# Patient Record
Sex: Male | Born: 1940 | Race: White | Hispanic: No | Smoking: Former smoker
Health system: Southern US, Community
[De-identification: ages and names within clinical notes are randomized; demographics above are authoritative.]

## PROBLEM LIST (undated history)

## (undated) DIAGNOSIS — C61 Malignant neoplasm of prostate: Secondary | ICD-10-CM

## (undated) DIAGNOSIS — N2581 Secondary hyperparathyroidism of renal origin: Secondary | ICD-10-CM

## (undated) DIAGNOSIS — I2510 Atherosclerotic heart disease of native coronary artery without angina pectoris: Secondary | ICD-10-CM

## (undated) DIAGNOSIS — N186 End stage renal disease: Secondary | ICD-10-CM

## (undated) DIAGNOSIS — D649 Anemia, unspecified: Secondary | ICD-10-CM

## (undated) DIAGNOSIS — I10 Essential (primary) hypertension: Secondary | ICD-10-CM

## (undated) DIAGNOSIS — E785 Hyperlipidemia, unspecified: Secondary | ICD-10-CM

## (undated) DIAGNOSIS — Z923 Personal history of irradiation: Secondary | ICD-10-CM

## (undated) DIAGNOSIS — M1990 Unspecified osteoarthritis, unspecified site: Secondary | ICD-10-CM

## (undated) DIAGNOSIS — Q613 Polycystic kidney, unspecified: Secondary | ICD-10-CM

## (undated) DIAGNOSIS — N289 Disorder of kidney and ureter, unspecified: Secondary | ICD-10-CM

## (undated) DIAGNOSIS — I219 Acute myocardial infarction, unspecified: Secondary | ICD-10-CM

## (undated) DIAGNOSIS — B029 Zoster without complications: Secondary | ICD-10-CM

## (undated) HISTORY — PX: JOINT REPLACEMENT: SHX530

## (undated) HISTORY — PX: HERNIA REPAIR: SHX51

## (undated) HISTORY — PX: ROTATOR CUFF REPAIR: SHX139

## (undated) HISTORY — DX: Atherosclerotic heart disease of native coronary artery without angina pectoris: I25.10

## (undated) HISTORY — PX: AORTIC VALVE REPLACEMENT: SHX41

## (undated) HISTORY — DX: Unspecified osteoarthritis, unspecified site: M19.90

## (undated) HISTORY — DX: Hyperlipidemia, unspecified: E78.5

## (undated) HISTORY — DX: Essential (primary) hypertension: I10

## (undated) HISTORY — DX: Zoster without complications: B02.9

## (undated) HISTORY — PX: REPLACEMENT TOTAL KNEE BILATERAL: SUR1225

## (undated) HISTORY — DX: Anemia, unspecified: D64.9

## (undated) HISTORY — PX: CORONARY ARTERY BYPASS GRAFT: SHX141

## (undated) HISTORY — DX: Secondary hyperparathyroidism of renal origin: N25.81

---

## 2004-04-09 ENCOUNTER — Other Ambulatory Visit: Payer: Self-pay

## 2004-04-19 ENCOUNTER — Ambulatory Visit: Payer: Self-pay | Admitting: Unknown Physician Specialty

## 2004-12-10 ENCOUNTER — Ambulatory Visit: Payer: Self-pay | Admitting: General Practice

## 2005-01-18 ENCOUNTER — Other Ambulatory Visit: Payer: Self-pay

## 2005-01-25 ENCOUNTER — Ambulatory Visit: Payer: Self-pay | Admitting: General Practice

## 2005-05-27 ENCOUNTER — Other Ambulatory Visit: Payer: Self-pay

## 2005-06-03 ENCOUNTER — Inpatient Hospital Stay: Payer: Self-pay | Admitting: General Practice

## 2005-07-10 ENCOUNTER — Ambulatory Visit: Payer: Self-pay | Admitting: Nephrology

## 2005-12-17 ENCOUNTER — Other Ambulatory Visit: Payer: Self-pay

## 2005-12-31 ENCOUNTER — Inpatient Hospital Stay: Payer: Self-pay | Admitting: General Practice

## 2006-07-04 ENCOUNTER — Ambulatory Visit: Payer: Self-pay | Admitting: Nephrology

## 2007-01-02 ENCOUNTER — Other Ambulatory Visit: Payer: Self-pay

## 2007-01-02 ENCOUNTER — Ambulatory Visit: Payer: Self-pay | Admitting: Vascular Surgery

## 2007-01-07 ENCOUNTER — Ambulatory Visit: Payer: Self-pay | Admitting: Vascular Surgery

## 2007-10-21 ENCOUNTER — Ambulatory Visit: Payer: Self-pay | Admitting: Family Medicine

## 2007-11-19 ENCOUNTER — Ambulatory Visit: Payer: Self-pay

## 2008-01-12 ENCOUNTER — Ambulatory Visit: Payer: Self-pay | Admitting: Unknown Physician Specialty

## 2009-05-10 ENCOUNTER — Ambulatory Visit: Payer: Self-pay | Admitting: Family Medicine

## 2010-03-09 ENCOUNTER — Ambulatory Visit: Payer: Self-pay | Admitting: Vascular Surgery

## 2010-06-24 DIAGNOSIS — C61 Malignant neoplasm of prostate: Secondary | ICD-10-CM

## 2010-06-24 DIAGNOSIS — I219 Acute myocardial infarction, unspecified: Secondary | ICD-10-CM

## 2010-06-24 HISTORY — DX: Acute myocardial infarction, unspecified: I21.9

## 2010-06-24 HISTORY — DX: Malignant neoplasm of prostate: C61

## 2011-01-01 ENCOUNTER — Inpatient Hospital Stay: Payer: Self-pay | Admitting: Family Medicine

## 2011-02-01 ENCOUNTER — Ambulatory Visit: Payer: Self-pay | Admitting: Family Medicine

## 2011-02-06 ENCOUNTER — Encounter: Payer: Self-pay | Admitting: Cardiology

## 2011-02-23 ENCOUNTER — Encounter: Payer: Self-pay | Admitting: Cardiology

## 2011-03-13 ENCOUNTER — Ambulatory Visit: Payer: Self-pay | Admitting: Vascular Surgery

## 2011-03-25 ENCOUNTER — Encounter: Payer: Self-pay | Admitting: Cardiology

## 2011-04-25 ENCOUNTER — Encounter: Payer: Self-pay | Admitting: Cardiology

## 2011-05-12 ENCOUNTER — Inpatient Hospital Stay: Payer: Self-pay | Admitting: Family Medicine

## 2011-05-27 ENCOUNTER — Emergency Department: Payer: Self-pay | Admitting: Emergency Medicine

## 2011-06-05 ENCOUNTER — Ambulatory Visit: Payer: Self-pay | Admitting: Vascular Surgery

## 2011-06-10 ENCOUNTER — Ambulatory Visit: Payer: Self-pay | Admitting: Vascular Surgery

## 2011-06-11 LAB — PATHOLOGY REPORT

## 2011-08-14 ENCOUNTER — Ambulatory Visit: Payer: Self-pay | Admitting: Vascular Surgery

## 2011-08-14 LAB — BASIC METABOLIC PANEL
BUN: 24 mg/dL — ABNORMAL HIGH (ref 7–18)
Calcium, Total: 8.8 mg/dL (ref 8.5–10.1)
Chloride: 99 mmol/L (ref 98–107)
Creatinine: 6.82 mg/dL — ABNORMAL HIGH (ref 0.60–1.30)
EGFR (African American): 10 — ABNORMAL LOW
EGFR (Non-African Amer.): 9 — ABNORMAL LOW
Osmolality: 285 (ref 275–301)
Potassium: 4.6 mmol/L (ref 3.5–5.1)
Sodium: 141 mmol/L (ref 136–145)

## 2011-08-14 LAB — CBC
HCT: 34.8 % — ABNORMAL LOW (ref 40.0–52.0)
HGB: 11.9 g/dL — ABNORMAL LOW (ref 13.0–18.0)
MCH: 33.9 pg (ref 26.0–34.0)
MCHC: 34.1 g/dL (ref 32.0–36.0)
MCV: 99 fL (ref 80–100)
Platelet: 211 10*3/uL (ref 150–440)

## 2011-08-21 ENCOUNTER — Ambulatory Visit: Payer: Self-pay | Admitting: Vascular Surgery

## 2011-08-21 LAB — POTASSIUM: Potassium: 4.5 mmol/L (ref 3.5–5.1)

## 2011-10-17 ENCOUNTER — Encounter: Payer: Self-pay | Admitting: Cardiology

## 2011-10-23 ENCOUNTER — Encounter: Payer: Self-pay | Admitting: Cardiology

## 2011-11-23 ENCOUNTER — Encounter: Payer: Self-pay | Admitting: Cardiology

## 2011-12-23 ENCOUNTER — Encounter: Payer: Self-pay | Admitting: Cardiology

## 2012-08-29 ENCOUNTER — Ambulatory Visit: Payer: Self-pay | Admitting: Sports Medicine

## 2012-12-03 DIAGNOSIS — Q613 Polycystic kidney, unspecified: Secondary | ICD-10-CM | POA: Insufficient documentation

## 2012-12-04 DIAGNOSIS — D638 Anemia in other chronic diseases classified elsewhere: Secondary | ICD-10-CM | POA: Insufficient documentation

## 2012-12-04 DIAGNOSIS — N2581 Secondary hyperparathyroidism of renal origin: Secondary | ICD-10-CM | POA: Insufficient documentation

## 2013-02-02 ENCOUNTER — Ambulatory Visit: Payer: Self-pay | Admitting: General Practice

## 2013-02-11 DIAGNOSIS — M4640 Discitis, unspecified, site unspecified: Secondary | ICD-10-CM | POA: Insufficient documentation

## 2013-02-25 ENCOUNTER — Other Ambulatory Visit: Payer: Self-pay | Admitting: General Practice

## 2013-02-25 LAB — SYNOVIAL CELL COUNT + DIFF, W/ CRYSTALS
Basophil: 0 %
Crystals, Joint Fluid: NONE SEEN
Eosinophil: 0 %
Lymphocytes: 0 %
Neutrophils: 99 %
Other Cells BF: 0 %
Other Mononuclear Cells: 1 %

## 2013-03-01 LAB — BODY FLUID CULTURE

## 2013-03-22 ENCOUNTER — Ambulatory Visit: Payer: Self-pay | Admitting: General Practice

## 2013-03-22 LAB — CBC WITH DIFFERENTIAL/PLATELET
Basophil #: 0 10*3/uL (ref 0.0–0.1)
Basophil %: 0.4 %
Eosinophil %: 6.5 %
HCT: 31.2 % — ABNORMAL LOW (ref 40.0–52.0)
Lymphocyte #: 0.9 10*3/uL — ABNORMAL LOW (ref 1.0–3.6)
Lymphocyte %: 9 %
MCH: 31.8 pg (ref 26.0–34.0)
MCV: 92 fL (ref 80–100)
Neutrophil %: 76.3 %
Platelet: 249 10*3/uL (ref 150–440)
RDW: 14.4 % (ref 11.5–14.5)
WBC: 9.6 10*3/uL (ref 3.8–10.6)

## 2013-03-22 LAB — BASIC METABOLIC PANEL
Anion Gap: 7 (ref 7–16)
BUN: 38 mg/dL — ABNORMAL HIGH (ref 7–18)
Chloride: 100 mmol/L (ref 98–107)
Co2: 27 mmol/L (ref 21–32)
Creatinine: 9.49 mg/dL — ABNORMAL HIGH (ref 0.60–1.30)
EGFR (African American): 6 — ABNORMAL LOW
EGFR (Non-African Amer.): 5 — ABNORMAL LOW
Osmolality: 276 (ref 275–301)

## 2013-03-29 ENCOUNTER — Ambulatory Visit: Payer: Self-pay | Admitting: General Practice

## 2013-03-29 LAB — SYNOVIAL CELL COUNT + DIFF, W/ CRYSTALS
Eosinophil: 0 %
Nucleated Cell Count: 42049 /mm3
Other Cells BF: 0 %

## 2013-03-31 LAB — PATHOLOGY REPORT

## 2013-04-02 LAB — BODY FLUID CULTURE

## 2013-08-26 ENCOUNTER — Ambulatory Visit: Payer: Self-pay | Admitting: Unknown Physician Specialty

## 2013-11-23 ENCOUNTER — Encounter: Payer: Self-pay | Admitting: Internal Medicine

## 2014-02-16 DIAGNOSIS — Z951 Presence of aortocoronary bypass graft: Secondary | ICD-10-CM | POA: Insufficient documentation

## 2014-05-13 DIAGNOSIS — Q446 Cystic disease of liver: Secondary | ICD-10-CM | POA: Insufficient documentation

## 2014-07-05 ENCOUNTER — Inpatient Hospital Stay: Payer: Self-pay | Admitting: Family Medicine

## 2014-07-05 LAB — CBC
HCT: 35.7 % — ABNORMAL LOW (ref 40.0–52.0)
HGB: 11.1 g/dL — ABNORMAL LOW (ref 13.0–18.0)
MCH: 29.7 pg (ref 26.0–34.0)
MCHC: 31.1 g/dL — AB (ref 32.0–36.0)
MCV: 95 fL (ref 80–100)
PLATELETS: 129 10*3/uL — AB (ref 150–440)
RBC: 3.74 10*6/uL — ABNORMAL LOW (ref 4.40–5.90)
RDW: 20.6 % — AB (ref 11.5–14.5)
WBC: 6.2 10*3/uL (ref 3.8–10.6)

## 2014-07-05 LAB — COMPREHENSIVE METABOLIC PANEL
ALBUMIN: 3 g/dL — AB (ref 3.4–5.0)
ALK PHOS: 112 U/L
Anion Gap: 8 (ref 7–16)
BUN: 25 mg/dL — ABNORMAL HIGH (ref 7–18)
Bilirubin,Total: 0.6 mg/dL (ref 0.2–1.0)
CALCIUM: 8.5 mg/dL (ref 8.5–10.1)
Chloride: 101 mmol/L (ref 98–107)
Co2: 33 mmol/L — ABNORMAL HIGH (ref 21–32)
Creatinine: 5.72 mg/dL — ABNORMAL HIGH (ref 0.60–1.30)
EGFR (African American): 13 — ABNORMAL LOW
EGFR (Non-African Amer.): 10 — ABNORMAL LOW
GLUCOSE: 169 mg/dL — AB (ref 65–99)
Osmolality: 291 (ref 275–301)
Potassium: 4.8 mmol/L (ref 3.5–5.1)
SGOT(AST): 39 U/L — ABNORMAL HIGH (ref 15–37)
SGPT (ALT): 19 U/L
Sodium: 142 mmol/L (ref 136–145)
Total Protein: 6.4 g/dL (ref 6.4–8.2)

## 2014-07-05 LAB — CK
CK, TOTAL: 100 U/L (ref 39–308)
CK, Total: 113 U/L (ref 39–308)

## 2014-07-05 LAB — TROPONIN I
TROPONIN-I: 1.4 ng/mL — AB
TROPONIN-I: 1.6 ng/mL — AB
Troponin-I: <0.02 ng/mL
Troponin-I: 1.6 ng/mL — ABNORMAL HIGH

## 2014-07-05 LAB — PROTIME-INR
INR: 1.1
PROTHROMBIN TIME: 13.7 s (ref 11.5–14.7)

## 2014-07-05 LAB — APTT: Activated PTT: 35.5 secs (ref 23.6–35.9)

## 2014-07-05 LAB — HEPARIN LEVEL (UNFRACTIONATED): Anti-Xa(Unfractionated): <0.1 IU/mL — ABNORMAL LOW (ref 0.30–0.70)

## 2014-07-05 LAB — CK TOTAL AND CKMB (NOT AT ARMC)
CK, Total: 119 U/L (ref 39–308)
CK-MB: 3.6 ng/mL (ref 0.5–3.6)

## 2014-07-05 LAB — CK-MB: CK-MB: 6.7 ng/mL — AB (ref 0.5–3.6)

## 2014-07-06 LAB — BASIC METABOLIC PANEL
ANION GAP: 7 (ref 7–16)
BUN: 40 mg/dL — ABNORMAL HIGH (ref 7–18)
CALCIUM: 8.5 mg/dL (ref 8.5–10.1)
CHLORIDE: 100 mmol/L (ref 98–107)
CO2: 32 mmol/L (ref 21–32)
Creatinine: 7.65 mg/dL — ABNORMAL HIGH (ref 0.60–1.30)
EGFR (African American): 9 — ABNORMAL LOW
EGFR (Non-African Amer.): 7 — ABNORMAL LOW
Glucose: 80 mg/dL (ref 65–99)
OSMOLALITY: 286 (ref 275–301)
Potassium: 5.5 mmol/L — ABNORMAL HIGH (ref 3.5–5.1)
Sodium: 139 mmol/L (ref 136–145)

## 2014-07-06 LAB — CBC WITH DIFFERENTIAL/PLATELET
BASOS PCT: 1.2 %
Basophil #: 0.1 10*3/uL (ref 0.0–0.1)
EOS ABS: 0.2 10*3/uL (ref 0.0–0.7)
Eosinophil %: 3.6 %
HCT: 35.8 % — AB (ref 40.0–52.0)
HGB: 11.3 g/dL — AB (ref 13.0–18.0)
LYMPHS PCT: 15 %
Lymphocyte #: 0.9 10*3/uL — ABNORMAL LOW (ref 1.0–3.6)
MCH: 30.2 pg (ref 26.0–34.0)
MCHC: 31.5 g/dL — ABNORMAL LOW (ref 32.0–36.0)
MCV: 96 fL (ref 80–100)
MONOS PCT: 12.2 %
Monocyte #: 0.8 x10 3/mm (ref 0.2–1.0)
Neutrophil #: 4.2 10*3/uL (ref 1.4–6.5)
Neutrophil %: 68 %
Platelet: 123 10*3/uL — ABNORMAL LOW (ref 150–440)
RBC: 3.74 10*6/uL — ABNORMAL LOW (ref 4.40–5.90)
RDW: 20.4 % — ABNORMAL HIGH (ref 11.5–14.5)
WBC: 6.2 10*3/uL (ref 3.8–10.6)

## 2014-07-19 ENCOUNTER — Ambulatory Visit: Payer: Self-pay | Admitting: Cardiology

## 2014-07-29 ENCOUNTER — Emergency Department: Payer: Self-pay | Admitting: Emergency Medicine

## 2014-08-09 DIAGNOSIS — Z952 Presence of prosthetic heart valve: Secondary | ICD-10-CM | POA: Insufficient documentation

## 2014-09-26 ENCOUNTER — Ambulatory Visit
Admit: 2014-09-26 | Disposition: A | Discharge status: Home / Self Care | Payer: Self-pay | Attending: Surgery | Admitting: Surgery

## 2014-09-26 LAB — BASIC METABOLIC PANEL
Anion Gap: 9 (ref 7–16)
BUN: 15 mg/dL
CHLORIDE: 95 mmol/L — AB
CREATININE: 3.67 mg/dL — AB
Calcium, Total: 8.3 mg/dL — ABNORMAL LOW
Co2: 37 mmol/L — ABNORMAL HIGH
EGFR (Non-African Amer.): 16 — ABNORMAL LOW
GFR CALC AF AMER: 18 — AB
GLUCOSE: 90 mg/dL
Potassium: 4.1 mmol/L
Sodium: 141 mmol/L

## 2014-09-26 LAB — CBC WITH DIFFERENTIAL/PLATELET
Basophil #: 0.1 10*3/uL (ref 0.0–0.1)
Basophil %: 0.8 %
EOS PCT: 3.3 %
Eosinophil #: 0.2 10*3/uL (ref 0.0–0.7)
HCT: 32.7 % — AB (ref 40.0–52.0)
HGB: 10.1 g/dL — ABNORMAL LOW (ref 13.0–18.0)
Lymphocyte #: 0.7 10*3/uL — ABNORMAL LOW (ref 1.0–3.6)
Lymphocyte %: 9.7 %
MCH: 26.4 pg (ref 26.0–34.0)
MCHC: 31 g/dL — ABNORMAL LOW (ref 32.0–36.0)
MCV: 85 fL (ref 80–100)
MONOS PCT: 10.9 %
Monocyte #: 0.7 x10 3/mm (ref 0.2–1.0)
NEUTROS ABS: 5.1 10*3/uL (ref 1.4–6.5)
Neutrophil %: 75.3 %
Platelet: 147 10*3/uL — ABNORMAL LOW (ref 150–440)
RBC: 3.84 10*6/uL — AB (ref 4.40–5.90)
RDW: 18.8 % — ABNORMAL HIGH (ref 11.5–14.5)
WBC: 6.8 10*3/uL (ref 3.8–10.6)

## 2014-10-04 ENCOUNTER — Ambulatory Visit
Admit: 2014-10-04 | Disposition: A | Discharge status: Home / Self Care | Payer: Self-pay | Attending: Surgery | Admitting: Surgery

## 2014-10-05 LAB — CBC WITH DIFFERENTIAL/PLATELET
BASOS PCT: 0.3 %
Basophil #: 0 10*3/uL (ref 0.0–0.1)
Eosinophil #: 0.3 10*3/uL (ref 0.0–0.7)
Eosinophil %: 3.1 %
HCT: 29.4 % — AB (ref 40.0–52.0)
HGB: 9 g/dL — ABNORMAL LOW (ref 13.0–18.0)
LYMPHS ABS: 0.6 10*3/uL — AB (ref 1.0–3.6)
Lymphocyte %: 6.6 %
MCH: 26.1 pg (ref 26.0–34.0)
MCHC: 30.6 g/dL — ABNORMAL LOW (ref 32.0–36.0)
MCV: 85 fL (ref 80–100)
MONOS PCT: 9 %
Monocyte #: 0.8 x10 3/mm (ref 0.2–1.0)
NEUTROS PCT: 81 %
Neutrophil #: 7.1 10*3/uL — ABNORMAL HIGH (ref 1.4–6.5)
Platelet: 157 10*3/uL (ref 150–440)
RBC: 3.44 10*6/uL — ABNORMAL LOW (ref 4.40–5.90)
RDW: 19.4 % — ABNORMAL HIGH (ref 11.5–14.5)
WBC: 8.8 10*3/uL (ref 3.8–10.6)

## 2014-10-05 LAB — RENAL FUNCTION PANEL
ANION GAP: 12 (ref 7–16)
Albumin: 3 g/dL — ABNORMAL LOW
BUN: 37 mg/dL — ABNORMAL HIGH
CALCIUM: 8.3 mg/dL — AB
CREATININE: 7.08 mg/dL — AB
Chloride: 91 mmol/L — ABNORMAL LOW
Co2: 29 mmol/L
EGFR (Non-African Amer.): 7 — ABNORMAL LOW
GFR CALC AF AMER: 8 — AB
Glucose: 90 mg/dL
PHOSPHORUS: 6.4 mg/dL — AB
POTASSIUM: 5.2 mmol/L — AB
SODIUM: 132 mmol/L — AB

## 2014-10-14 NOTE — Op Note (Signed)
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  PROCEDURE PERFORMED: Right knee arthroscopy and extensive three compartment synovectomy.   SURGEON: Laurice Record. Hooten, MD.   ANESTHESIA: General.   ESTIMATED BLOOD LOSS: Minimal.   TOURNIQUET TIME: Not used.   DRAINS: None.   SPECIMENS: Fluid was submitted for complete cell count, evaluation for crystals, stat Gram stain, and culture and sensitivity. Synovial specimens were submitted for stat Gram stain, culture and sensitivity and routine pathology.   INDICATIONS FOR SURGERY: The patient is a 74 year old gentleman who has remote history of right total knee arthroplasty. He had done well until the last several months. He has end-stage renal disease and undergoes hemodialysis 3 times a week. He apparently developed abscess of the spine and was treated at [facility] with IV antibiotics. At roughly the same time, he had the onset of swelling, and some pain to the right knee. Aspiration both at [facility] and subsequently in our office showed no growth and no apparent organisms. However, the patient has had recurrent effusions. After discussion of the risks and benefits of surgical intervention, the patient expressed understanding of the risks and benefits and agreed with plans for right knee arthroscopy, synovial biopsy, and synovectomy.   PROCEDURE IN DETAIL: The patient was brought to the operating room and, after adequate general anesthesia was achieved, a tourniquet was placed on the patient's right thigh and leg was placed in a leg holder. All bony prominences were well padded. The patient's right knee and leg were cleaned and  prepped with alcohol and DuraPrep and draped in the usual sterile fashion. A "timeout" was performed as per usual protocol. It was at that point that it was discovered that the circulating nurse had administered the antibiotics prematurely. The lab was subsequently notified that all specimens should be handled with the understanding that a single dose of IV antibiotics had been given prior to the procedure. The anticipated portal sites were injected with 0.25% Marcaine with epinephrine. An anterolateral portal was created and a cannula was inserted. A large effusion was evacuated and fluid was submitted for stat Gram stain, cell count, evaluation for crystals, culture and sensitivity.  A stat Gram stain showed no organisms but many WBCs. No crystals were seen in the specimen. The scope was inserted and the knee was distended with fluid using the Stryker pump. Under visualization with the scope, an anteromedial portal was created and probe was inserted. The medial gutter had an extensive amount of synovial tissue with extension anteriorly so that it was initially difficult to visualize the implants. A shaver was inserted and the thickened, fibrotic synovial tissue was initially debrided using a 4.5 mm shaver. Synovial tissue was removed using pituitary rongeurs and submitted for Gram stain, culture and sensitivity. Additional debridement along the medial gutter and anteromedial aspect of the knee was performed using a 50 degrees ArthroCare wand. The implants appeared to be in good condition. The gutter was felt to be well debrided of synovial tissue and in good hemostasis was appreciated. The scope was then removed from the anterolateral portal and reinserted via the anteromedial portal. Some synovial tissue was removed from the notch and also submitted for culture and sensitivity. An extremely thickened, fibrotic  synovial tissue was encountered along the anterolateral aspect and extending along the lateral gutter.  Meticulous debridement was performed using a combination of the 50 degrees ArthroCare wand and the 4.5 mm shaver. The lateral gutter was felt to be cleared of the exuberant synovial tissue. Good hemostasis was noted. Finally, the scope was positioned so as to visualize the patellofemoral articulation. Again, abundant somewhat hypertrophic synovial tissue was noted about the patella. The tissue was debrided using the 50 degrees ArthroCare wand and a 4.5 mm shaver. Mild wear was noted to the central component of the patella. The synovial tissue in both the medial and lateral aspects of the patella was adequately debrided free. Good patellar tracking was noted. Finally, a superolateral portal was created and bands of hypertrophic synovium more debrided using a combination of 4.5 mm shaver and the 50 degrees ArthroCare wand. This extended along the superior portion of the medial and lateral gutters up into the suprapatellar pouch. Good patellar tracking was noted.   The knee was irrigated with copious amounts of fluid and then suctioned dry. The superolateral and anterolateral portals was reapproximated using 3-0 nylon. A combination of 0.25% Marcaine with epinephrine and 4 mg of morphine was injected via the scope. The scope was removed and the anteromedial portal was reapproximated using 3-0 nylon. Sterile dressing was applied followed by application of an ice wrap.   The patient tolerated the procedure well. He was transported to the recovery room in stable condition.     ____________________________ Laurice Record. Holley Bouche., MD [email or web address] D: 03/30/2013 06:27:58 ET T: 03/30/2013 07:00:47 ET JOB#: [id number]  cc: Laurice Record. Holley Bouche., MD, <Dictator> JAMES P Holley Bouche MD ELECTRONICALLY SIGNED 04/03/2013 15:11

## 2014-10-16 NOTE — Op Note (Signed)
PATIENT NAME:  Jimmy Carson, Jimmy Carson MR#:  [id number] DATE OF BIRTH:  [birth year]  DATE OF PROCEDURE:  08/21/2011  PREOPERATIVE DIAGNOSES:  1. End-stage renal disease.  2. Prostate cancer.  3. Hypertension.   POSTOPERATIVE DIAGNOSES:  1. End-stage renal disease.  2. Prostate cancer.  3. Hypertension.   PROCEDURE: Left brachiocephalic AV fistula creation.   SURGEON: Algernon Huxley, MD   ANESTHESIA: General.   ESTIMATED BLOOD LOSS: Approximately 25 mL.   INDICATION FOR PROCEDURE: This is a 74 year old white male with end-stage renal disease. He had a stent placed in a previous left radiocephalic AV fistula that developed infection and had to be excised and this fistula sacrificed. His wound is well healed. He has no clear signs of infection currently. He has an adequate cephalic vein in the upper arm. He is brought in for brachiocephalic AV fistula creation. Risks and benefits were discussed. Informed consent was obtained.   DESCRIPTION OF PROCEDURE: The patient was brought to the operative suite and after an adequate level of general endotracheal anesthesia was obtained, his left upper extremity was sterilely prepped and draped and a sterile surgical field was created. A curvilinear incision was created at the antecubital fossa. I dissected out the cephalic vein, marked this for orientation, and the median antecubital vein which was a very large equal branch to the cephalic vein itself and lay very near the brachial artery. The brachial artery was dissected out and encircled with vessel loops proximally and distally. The patient was given 3000 units of intravenous heparin systemic anticoagulation. I then ligated the vein distally near its junction to the basilic vein, cut and beveled the vein to match an anterior wall arteriotomy and then created anastomosis with a running 6-0 Prolene suture in the usual fashion. A single 6-0 Prolene patch suture was used for hemostasis. The vessel was flushed and  de-aired prior to release of control. On release there was excellent thrill within the AV fistula. There were several venous branches ligated and divided between silk ties as part of our mobilization. The wound was irrigated. Surgicel and Evicyl topical hemostatic agents were placed. Hemostasis was complete. The wound was closed with a running 3-0 Vicryl and a 4-0 Monocryl and Dermabond was placed as a dressing. The patient tolerated the procedure well and was taken to the recovery room in stable condition.   ____________________________ Algernon Huxley, MD [email or web address]:[id number] D: 08/21/2011 14:06:06 ET T: 08/21/2011 14:44:03 ET JOB#: [id number]  cc: Algernon Huxley, MD, <Dictator> Algernon Huxley MD ELECTRONICALLY SIGNED 08/22/2011 12:08

## 2014-10-23 NOTE — Op Note (Signed)
PATIENT NAME:  Jimmy Carson, Jimmy Carson MR#:  [id number] DATE OF BIRTH:  1940/11/27  DATE OF PROCEDURE:  10/04/2014  PREOPERATIVE DIAGNOSIS: Bilateral inguinal hernias, umbilical hernia.   POSTOPERATIVE DIAGNOSIS: Bilateral inguinal hernias, umbilical hernia.   PROCEDURE: Bilateral inguinal hernia repairs and umbilical hernia repair.   SURGEON: Rochel Brome, MD  ANESTHESIA: General.   INDICATIONS: This 74 year old male had recent bulging in the left groin with moderate discomfort. Also has noted some bulging in the right groin and at the umbilicus. He had had recent aortic valve repair which was done through a right groin approach and had developed a seroma which has decreased in size. That transverse incision was made just lateral and inferior to the site of the intended hernia repair incision. Surgery was recommended for definitive treatment.   DESCRIPTION OF PROCEDURE: The patient was placed on the operating table in the supine position under general endotracheal anesthesia. The abdomen was clipped and prepared with ChloraPrep and draped in a sterile manner. A mark was made with a surgical marker to denote the midline and the intended sites for incision.   First on the left side, a transversely oriented suprapubic incision was made some 8 cm in length, carried down through subcutaneous tissues. Two traversing veins were divided between 4-0 chromic suture ligatures. Scarpa fascia was incised and dissection was carried down to the external oblique aponeurosis, which was incised along the course of its fibers to open the external ring and expose the inguinal cord structures. The cord structures were mobilized. There appeared to be some significant involvement of the ilioinguinal nerve and it appeared to be best to divide it to avoid postoperative pain. Subsequently, the cord structures were mobilized. A Penrose drain was passed around the cord structures for traction. The cremaster fibers were spread to  expose an indirect hernia sac, which was dissected free from surrounding structures. The sac was some 9 cm in length. It was opened. Its continuity with the peritoneal cavity was demonstrated. A small amount of bowel could be seen pushing up through the internal ring into the sac. A high ligation of the sac was done with a 4-0 Vicryl ligature followed by suture ligature and the sac was amputated and was not sent for pathology. There was a cord lipoma which was dissected free from surrounding structures and was ligated with 4-0 Vicryl suture ligature and amputated and was not sent for pathology. The floor of the inguinal canal was examined and found to have some moderate weakness. A repair was carried out with a row of 0 Surgilon sutures beginning at the pubic tubercle, suturing the conjoined tendon to the shelving edge of the inguinal ligament incorporating transversalis fascia into the repair. The last stitch led to satisfactory narrowing of the internal ring. An onlay Bard soft mesh was cut to create an oval shape of some 4 x 6 cm, and a notch cut out to straddle the internal ring, and it was placed over the repair and sutured to the repair with 0 Surgilon, also was sutured on both sides of the internal ring. It is further noted that a relaxing incision was made medially and the mesh covered the relaxing incision and was sutured to the medial side of the fascia. The repair looked good and hemostasis was intact. Cord structures were replaced along the floor of the inguinal canal. Cut edges of the external oblique aponeurosis were closed with running 4-0 Vicryl. The deep fascia superior and lateral to the repair site was infiltrated with 0.5%  Sensorcaine with epinephrine. Subcutaneous tissues were infiltrated as well. Scarpa fascia was closed with interrupted 4-0 Vicryl. The skin was closed with running 4-0 Monocryl subcuticular suture.   Next, attention was turned to the right lower quadrant where a mirror image  incision was made some 8 cm in length, carried down through subcutaneous tissues. Two traversing veins were divided between 4-0 chromic suture ligatures and Scarpa fascia was incised. Dissection was carried down to the external oblique aponeurosis. There was a finding of scarring which appeared to be related to his previous surgery which distorted some of the anatomy and did slow down the dissection. The cord structures were mobilized. There was minimal degree of weakness in the floor of the inguinal canal. The cremaster fibers were spread to expose a cord lipoma which was dissected up to the internal ring and was suture ligated with 4-0 Vicryl and amputated and was not sent for pathology. There was also an indirect hernia sac which was dissected free from surrounding structures some 6 cm in length and it was opened. Its continuity with the peritoneal cavity was demonstrated. A high ligation of the sac was done with a 4-0 Vicryl ligature and suture ligature. The sac was excised and was not sent for pathology. It is noted that during the course of the procedure a number of small bleeding points were cauterized, and the repair was carried out with a row of 0 Surgilon sutures suturing the conjoined tendon to the shelving edge of the inguinal ligament and also did make a relaxing incision medially. The last stitch led to satisfactory narrowing of the internal ring. A similar but slightly smaller Bard soft mesh was cut out with a notch to straddle the internal ring and was sutured to the repair, also sutured to the medial aspect of the relaxing incision and on both sides of the internal ring. The repair looked good and hemostasis appeared to be intact. The cut edges of the external oblique aponeurosis were closed with running 4-0 Vicryl and again appreciated scarring and distortion of tissues in this area. The deep fascia superior and lateral to the repair site was infiltrated with 0.5% Sensorcaine with epinephrine and  subcutaneous tissues were infiltrated. It is noted that there were numerous small bleeding points which were cauterized on multiple occasions in the subcutaneous tissues and subcuticular tissues. The Scarpa fascia was closed with interrupted 4-0 Vicryl, and the skin was closed with running 4-0 Monocryl subcuticular suture.   Attention was turned to the umbilical hernia where a transversely oriented 3 cm supraumbilical incision was made and carried down through subcutaneous tissues to encounter an umbilical hernia sac which was dissected free from surrounding structures. The sac was approximately 2 cm in length. The fascial defect was very small, not more than about 5 mm, and there appeared to be good integrity of the surrounding fascia. The umbilical hernia sac was inverted. Next, the repair was carried out with a 0 Surgilon figure-of-eight suture. Hemostasis was intact. The wound was further infiltrated with 0.5% Sensorcaine with epinephrine and then the skin closure was carried out with a running 4-0 Monocryl subcuticular suture.   All 3 wounds were cleaned and dried and treated with LiquiBand. The testicles remained in the scrotum. The patient appeared to be in satisfactory condition and was prepared for transfer to the recovery room.   It is noted that the right inguinal hernia repair was more difficult than typical case requiring more dissection due to scar tissue and greater length of  time so that the whole operative procedure lasted more than 3 hours.  ____________________________ Lenna Sciara. Rochel Brome, MD [email or web address] D: 10/04/2014 11:08:37 ET T: 10/04/2014 12:30:55 ET JOB#: [id number]  cc: Loreli Dollar, MD, <Dictator> Loreli Dollar MD ELECTRONICALLY SIGNED 10/05/2014 18:12

## 2014-10-23 NOTE — Consult Note (Signed)
   Present Illness Pt with history of cad s/p cabg and pci, history of critical aortic stenosis, history of esrd on hd who was admitted after developing a brief episode of chest heaviness. He had cardiac cath at [facility] 2 weeks ago in preperation for TAVR. Cath did not reval critical cad. He was admitted and has mild serum troponin elevation to 1.8. He is currently pain free. CXR unremarkable for chf.   Physical Exam:  GEN well developed, well nourished, no acute distress   HEENT PERRL   NECK No masses   RESP normal resp effort  clear BS   CARD Regular rate and rhythm  Normal, S1, S2  Murmur   Murmur Systolic   Systolic Murmur Out flow   ABD denies tenderness  no Adominal Mass   LYMPH negative neck, negative axillae   EXTR negative cyanosis/clubbing, negative edema   SKIN normal to palpation   NEURO cranial nerves intact, motor/sensory function intact   PSYCH A+O to time, place, person   Review of Systems:  Subjective/Chief Complaint brief episode of chest heaviness   General: No Complaints   Skin: No Complaints   ENT: No Complaints   Eyes: No Complaints   Neck: No Complaints   Respiratory: No Complaints   Cardiovascular: Tightness   Gastrointestinal: No Complaints   Genitourinary: No Complaints   Vascular: No Complaints   Musculoskeletal: No Complaints   Neurologic: No Complaints   Hematologic: No Complaints   Endocrine: No Complaints   Psychiatric: No Complaints   Review of Systems: All other systems were reviewed and found to be negative   Medications/Allergies Reviewed Medications/Allergies reviewed   Family & Social History:  Family and Social History:  Family History Non-Contributory   EKG:  EKG NSR   Abnormal NSSTTW changes   Interpretation no acute ischemic or injury pattern noted    Penicillin: Hives  Orange: Hives   Impression 74 yo male with history of cad s/p cabg and pci as well as history of esrd on hd who is being  scheduled for tavr at [facility] next week who devloped chest heaviness today. He had cardiac cath at [facility] 2 weeks ago showing no progression of disease. Etiology of heaviness is likely secondary to his critical as and increased deman with mild volume overload and HD. Would contineu with hd and gentle diuresis. Conitnue current meds and follow for further symptoms. Would not proceed with repeat cardiac cth at present. Will attempt to discuss with Dr. Jenne Pane at [facility] 1. Continue current meds 2. Careful diuresis 3. Ambulate in am and if stable consider discharge 4 Will discuss with [facility] TAVR team when available.   Electronic Signatures: Teodoro Spray (MD)  (Signed 12-Jan-16 17:27)  Authored: General Aspect/Present Illness, History and Physical Exam, Review of System, Family & Social History, EKG , Allergies, Impression/Plan   Last Updated: 12-Jan-16 17:27 by Teodoro Spray (MD)

## 2014-10-23 NOTE — H&P (Signed)
PATIENT NAME:  Jimmy Carson, Jimmy Carson MR#:  [id number] DATE OF BIRTH:  1940/12/22  DATE OF ADMISSION:  07/05/2014  PRIMARY CARE PHYSICIAN: Dion Body, MD  EMERGENCY ROOM PHYSICIAN: Harvest Dark, MD  CHIEF COMPLAINT: Chest pain.   HISTORY OF PRESENT ILLNESS: A 74 year old male patient with history of hypertension, coronary artery disease, severe aortic stenosis and ESRD who comes in because of chest pain. The patient started to have heaviness in the chest, in the middle of the chest, this morning. Did not have radiation of chest pain to the back or the shoulders. Did not have any shortness of breath. No associated dizziness or sweating. The patient has some cough and clear phlegm. Denies any lower extremity edema. Last dialysis was yesterday and he had full dialysis yesterday. The patient is supposed to have aortic valve surgery next week at [facility] and the ER physician contacted [place] and they said they are in diversion. He already spoke to patient's cardiologist, Dr. Aline Brochure. He also recommended to admit him to this hospital and monitor for chest pain. The patient did not go to [facility] because of bed status and the cardiologist felt the patient can stay here. The patient's initial troponins were 0.02, second one is 1.40, but the patient is asymptomatic, did not have any chest pain.   PAST MEDICAL HISTORY: Significant for hypertension, ESRD on hemodialysis Monday, Wednesday, and Friday, follows up with [facility] nephrology, history of severe aortic stenosis. The patient's cardiac cath 2 weeks ago at [facility] was normal, and the patient is going to have aortic valve surgery next week at [facility]. The patient also has a history of triple vessel bypass and also had history of coronary artery disease with 2 stent placement. The patient also has a history of prostate cancer.  ALLERGIES: PENICILLIN AND ORANGES.   SOCIAL HISTORY: No smoking, no drinking.   PAST SURGICAL HISTORY: Significant for total knee replacement  and 6 bypasses, 2 stents.  MEDICATIONS: Crestor 10 mg p.o. daily, isosorbide mononitrate 60 mg daily, Plavix 75 mg p.o. daily, Renvela 800 mg 3 capsules p.o. t.i.d., Sensipar 30 mg p.o. daily, tamsulosin 0.4 mg p.o. daily, vitamin D 1,000 international units 1 capsule daily.   REVIEW OF SYSTEMS: CONSTITUTIONAL: He has no fever or fatigue.  ENT: No tinnitus. No epistaxis. No difficulty swallowing.  HEAD: No headache.  PULMONARY: The patient has some cough and phlegm, but no trouble breathing. No history of asthma.  CARDIOVASCULAR: Complains of chest pressure this morning. The patient did not say if relieved with nitroglycerin. The patient says it is relieved with rest.  GASTROINTESTINAL: No nausea. No vomiting. No diarrhea.  GENITOURINARY: No dysuria.  ENDOCRINE: No polyuria or polydipsia.  HEMATOLOGIC: No anemia.  INTEGUMENT: No skin rashes.  NEUROLOGIC: No strokes or TIA.  PSYCHIATRIC: No anxiety or insomnia.   PHYSICAL EXAMINATION: VITAL SIGNS: Temperature 98.1, heart rate 75, blood pressure 138/83, sats 99% on room air. GENERAL: He is alert, awake, oriented, an elderly male, not in distress, answering questions appropriately.  HEAD: Atraumatic, normocephalic.  EYES: Pupils equal and reacting to light. No conjunctival pallor. No icterus.  EARS: Tympanic membranes are clear.  NOSE: No turbinate hypertrophy. External nares are normal.  THROAT: No oropharyngeal erythema. No exudates.  NECK: Supple. No JVD. No carotid bruit. Normal range of motion. Thyroid in the midline.  CARDIOVASCULAR: S1, S2 regular. The patient's PMI is not displaced. Severe ejection systolic murmur present in aortic area radiating to the carotids. The patient has no other murmurs. Rate is regular.  The patient's pulses are equal in carotid and femoral. LUNGS: Clear to auscultation. No wheeze noted. Not using accessory muscles of respiration.  ABDOMEN: Soft, nontender, nondistended. Bowel sounds present. The patient  does not have any hernias. No CVA tenderness.  EXTREMITIES: The patient has left arm AV fistula present. No extremity edema. No cyanosis. Able to move extremities x4. No joint effusion. No major tenderness.  NEUROLOGIC: Alert, awake, oriented. Cranial nerves II through XII intact. Power 5/5 in upper and lower extremities. Sensation intact. DTRs 2+ bilaterally.  PSYCHIATRIC: Motor and affect are within normal limits.  SKIN: Inspection is normal.  LYMPH: No lymphadenopathy in cervical or axillary regions.   DIAGNOSTIC DATA: The patient's CK 119, CPK-MB 3.6; this is initial one. Troponin less than 0.02. INR 1.1. WBC 6.2, hemoglobin 11.1, hematocrit 35.7, platelets 129,000. Electrolytes: Sodium 142, potassium 4.8, chloride 101, bicarb 33, BUN 25, creatinine 5.72, glucose 169. The patient's second troponin 1.40. CPK-MB elevated at 6.7.   The patient's chest x-ray shows mild pulmonary vascular congestion, bibasilar airspace disease.    EKG: No ST-T changes and normal sinus rhythm, 60 beats per minute.   ASSESSMENT AND PLAN: 1.  The patient is a 74 year old male patient with chest pain, non-ST-elevation myocardial infarction. The patient has moderate risk factors of coronary artery disease with bypass and stents and also severe aortic stenosis. The patient cannot get beta blockers secondary to severe aortic stenosis, and he is on Imdur 60 mg daily. Continue that and he is on Crestor and Plavix, continue that. The patient will be seen by cardiology for further recommendations of either cardiac catheterization versus transfer to [facility]. The patient is not accepted at [facility] this morning because they are in diversion so he will be here, and the patient needs cardiology evaluation. Dr. Saralyn Pilar is the cardiologist. We are going to request a consult at this time. He is on Imdur so we will continue Imdur and start him on aspirin 81 mg daily.  2.  End-stage renal disease, on hemodialysis. Consult nephrology for  dialysis. He is on dialysis Monday, Wednesday and Friday.  3.  History of benign prostatic hypertrophy. Continue Flomax.   TIME SPENT: About 55 minutes. ____________________________ Epifanio Lesches, MD sk:sb D: 07/05/2014 11:23:02 ET T: 07/05/2014 11:54:29 ET JOB#: [id number]  cc: Epifanio Lesches, MD, <Dictator> Dion Body, MD Epifanio Lesches MD ELECTRONICALLY SIGNED 07/27/2014 18:50

## 2014-10-23 NOTE — Op Note (Signed)
PATIENT NAME:  Jimmy Carson, OUTING MR#:  [id number] DATE OF BIRTH:  Sep 05, 1940  DATE OF PROCEDURE:  10/04/2014  PREOPERATIVE DIAGNOSIS: A postoperative hematoma of the right groin.   POSTOPERATIVE DIAGNOSIS: A postoperative hematoma of the right groin.  PROCEDURE: Exploration of right groin wound with evacuation of hematoma and suture ligation of bleeding artery.   SURGEON: Rochel Brome, MD   ANESTHESIA: General.   INDICATIONS: This 74 year old male had bilateral inguinal hernia repair and umbilical hernia repair earlier in the day. It was noted there was scar tissue in the right groin subsequent to the previous surgery. He developed swelling in the recovery room and was found to have a hematoma and recommended return to the OR for treatment.   DESCRIPTION OF PROCEDURE: The patient was placed on the operating table in the supine position under general endotracheal anesthesia. The right groin was cleaned with adhesive remover and with ChloraPrep and draped in a sterile manner.   Incision was made at the old scar and removed remaining LiquiBand and opened the full extent of the incision, which was some 8 cm in length. The wound was explored and found to have some hematoma and some nonclotted blood. There was an arterial bleeding point in the lower side of the wound. The artery was about a millimeter a dimension and was spurting blood, and was suture ligated with 4-0 chromic and resolved. Next, a large hematoma was evacuated. It was further noted that the Scarpa fascia was incised and explored the wound. There was approximately 45 mL of clotted blood which was evacuated, and about 50 mL of liquid blood which was evacuated. There was a large area of ecchymosis and the wound was explored, and did not find any other point of bleeding. A 15 [place] Blake drain was inserted and brought out through a lateral stab wound and was buried beneath the Scarpa fascia.   The Scarpa fascia was closed with interrupted  4-0 Vicryl, and the skin was closed with a running 4-0 Monocryl subcuticular suture and LiquiBand dressing was applied around the drain site. The drain was activated and evacuated just a few milliliters of serosanguineous fluid. The patient appeared to be in satisfactory condition. It was further noted that there was no substantial swelling in the scrotum. The patient was prepared for transfer to the recovery room.   ____________________________ Lenna Sciara. Rochel Brome, MD jws:TM D: 10/04/2014 17:03:12 ET T: 10/04/2014 22:44:00 ET JOB#: [id number]  cc: Loreli Dollar, MD, <Dictator> Loreli Dollar MD ELECTRONICALLY SIGNED 10/05/2014 18:12

## 2014-10-23 NOTE — Discharge Summary (Signed)
PATIENT NAME:  Jimmy Carson, Jimmy Carson MR#:  [id number] DATE OF BIRTH:  December 17, 1940  DATE OF ADMISSION:  07/05/2014 DATE OF DISCHARGE:  07/06/2014  DISCHARGE DIAGNOSES: 1. Chest discomfort that has resolved, with history of coronary artery disease, pain secondary to cardiac demand secondary to severe aortic stenosis.  2. End-stage renal disease on hemodialysis.   DISCHARGE MEDICATIONS:  1. Vitamin D3 1000 international units daily.  2. Tamsulosin 0.4 mg p.o. daily.  3. Crestor 10 mg p.o. at bedtime.  4. Sensipar 30 mg p.o. daily.  5. Renvela 800 mg 3 tablets p.o. t.i.d. with meals and 2 tablets with snacks.  6. Imdur 60 mg p.o. daily.  7. Tylenol 325 mg 2 tablets p.o. q.4 h. as needed for pain.  8. Renal Caps 1 tablet daily.  9. Plavix 75 mg p.o. daily.   CONSULTS: Cardiology.   PROCEDURES: None.   PERTINENT LABORATORIES AND STUDIES: Prior to discharge, sodium 139, potassium 5.5, creatinine 7.65, glucose of 80. Troponins less than 0.02, 1.4, 1.6, 1.6. CK-MB 6.7. White blood cell count 6.2,  platelets 123,000.   BRIEF HOSPITAL COURSE: Chest discomfort. The patient initially came in with some chest heaviness with history of coronary artery disease, status post catheterization 2 weeks ago that did not show any blockages. His symptoms resolved spontaneously. He did not have any further exertional chest discomfort or at rest. His cardiac enzymes were slightly elevated as stated above; therefore, cardiology consult was obtained. He was evaluated by Dr. Ubaldo Glassing who felt that this was most likely cardiac demand from his severe aortic stenosis. He has plans for aortic valve surgery on January 19 per Dr. Aline Brochure.   I have asked him to follow up with Dr. Aline Brochure. Dr. Ubaldo Glassing has cleared him to be discharged today as he is able to ambulate without any chest discomfort. His EKG did not show any changes, as well. Other chronic issues are stable.   PLAN: To resume hemodialysis as outpatient today.    DISPOSITION: He is in stable condition and discharged to home. Follow up with Dr. Aline Brochure before 07/12/2014.   ____________________________ Dion Body, MD kl:JT D: 07/06/2014 08:30:59 ET T: 07/06/2014 13:37:24 ET JOB#: [id number]  cc: Dion Body, MD, <Dictator> Dion Body MD ELECTRONICALLY SIGNED 07/07/2014 12:03

## 2014-12-02 DIAGNOSIS — D72810 Lymphocytopenia: Secondary | ICD-10-CM | POA: Insufficient documentation

## 2014-12-02 DIAGNOSIS — Z8546 Personal history of malignant neoplasm of prostate: Secondary | ICD-10-CM | POA: Insufficient documentation

## 2015-04-10 ENCOUNTER — Emergency Department: Payer: Medicare PPO

## 2015-04-10 ENCOUNTER — Emergency Department
Admission: EM | Admit: 2015-04-10 | Discharge: 2015-04-10 | Disposition: A | Discharge status: Home / Self Care | Payer: Medicare PPO | Attending: Emergency Medicine | Admitting: Emergency Medicine

## 2015-04-10 ENCOUNTER — Encounter: Payer: Self-pay | Admitting: Emergency Medicine

## 2015-04-10 DIAGNOSIS — Y9289 Other specified places as the place of occurrence of the external cause: Secondary | ICD-10-CM | POA: Insufficient documentation

## 2015-04-10 DIAGNOSIS — Z87891 Personal history of nicotine dependence: Secondary | ICD-10-CM | POA: Diagnosis not present

## 2015-04-10 DIAGNOSIS — M25461 Effusion, right knee: Secondary | ICD-10-CM | POA: Diagnosis not present

## 2015-04-10 DIAGNOSIS — W1839XA Other fall on same level, initial encounter: Secondary | ICD-10-CM | POA: Insufficient documentation

## 2015-04-10 DIAGNOSIS — S0990XA Unspecified injury of head, initial encounter: Secondary | ICD-10-CM | POA: Diagnosis present

## 2015-04-10 DIAGNOSIS — Z880 Allergy status to penicillin: Secondary | ICD-10-CM | POA: Insufficient documentation

## 2015-04-10 DIAGNOSIS — Y998 Other external cause status: Secondary | ICD-10-CM | POA: Insufficient documentation

## 2015-04-10 DIAGNOSIS — Y9389 Activity, other specified: Secondary | ICD-10-CM | POA: Diagnosis not present

## 2015-04-10 HISTORY — DX: Polycystic kidney, unspecified: Q61.3

## 2015-04-10 HISTORY — DX: Personal history of irradiation: Z92.3

## 2015-04-10 HISTORY — DX: Acute myocardial infarction, unspecified: I21.9

## 2015-04-10 HISTORY — DX: Malignant neoplasm of prostate: C61

## 2015-04-10 HISTORY — DX: End stage renal disease: N18.6

## 2015-04-10 MED ORDER — ACETAMINOPHEN 500 MG PO TABS
ORAL_TABLET | ORAL | Status: AC
  Administered 2015-04-10: 1000 mg via ORAL
  Filled 2015-04-10: qty 2

## 2015-04-10 MED ORDER — ACETAMINOPHEN 500 MG PO TABS
1000.0000 mg | ORAL_TABLET | Freq: Once | ORAL | Status: AC
  Administered 2015-04-10: 1000 mg via ORAL

## 2015-04-10 NOTE — ED Notes (Signed)
Pt was going to dialysis.  He got out of car and felt unsteady on feet and was leaning on car.  Daughter did not know this and pulled away and he fell hitting head on pavement.  No LOC.  Pt c/o mild pain.  Dried blood to back of head.  Appears to be small abrasion.  Small knot to back of head.  Pt alert and oriented.  Reports he sometimes gets unsteady.

## 2015-04-10 NOTE — Discharge Instructions (Signed)
Concussion, Adult  A concussion, or closed-head injury, is a brain injury caused by a direct blow to the head or by a quick and sudden movement (jolt) of the head or neck. Concussions are usually not life-threatening. Even so, the effects of a concussion can be serious. If you have had a concussion before, you are more likely to experience concussion-like symptoms after a direct blow to the head.   CAUSES  · Direct blow to the head, such as from running into another player during a soccer game, being hit in a fight, or hitting your head on a hard surface.  · A jolt of the head or neck that causes the brain to move back and forth inside the skull, such as in a car crash.  SIGNS AND SYMPTOMS  The signs of a concussion can be hard to notice. Early on, they may be missed by you, family members, and health care providers. You may look fine but act or feel differently.  Symptoms are usually temporary, but they may last for days, weeks, or even longer. Some symptoms may appear right away while others may not show up for hours or days. Every head injury is different. Symptoms include:  · Mild to moderate headaches that will not go away.  · A feeling of pressure inside your head.  · Having more trouble than usual:    Learning or remembering things you have heard.    Answering questions.    Paying attention or concentrating.    Organizing daily tasks.    Making decisions and solving problems.  · Slowness in thinking, acting or reacting, speaking, or reading.  · Getting lost or being easily confused.  · Feeling tired all the time or lacking energy (fatigued).  · Feeling drowsy.  · Sleep disturbances.    Sleeping more than usual.    Sleeping less than usual.    Trouble falling asleep.    Trouble sleeping (insomnia).  · Loss of balance or feeling lightheaded or dizzy.  · Nausea or vomiting.  · Numbness or tingling.  · Increased sensitivity to:    Sounds.    Lights.    Distractions.  · Vision problems or eyes that tire  easily.  · Diminished sense of taste or smell.  · Ringing in the ears.  · Mood changes such as feeling sad or anxious.  · Becoming easily irritated or angry for little or no reason.  · Lack of motivation.  · Seeing or hearing things other people do not see or hear (hallucinations).  DIAGNOSIS  Your health care provider can usually diagnose a concussion based on a description of your injury and symptoms. He or she will ask whether you passed out (lost consciousness) and whether you are having trouble remembering events that happened right before and during your injury.  Your evaluation might include:  · A brain scan to look for signs of injury to the brain. Even if the test shows no injury, you may still have a concussion.  · Blood tests to be sure other problems are not present.  TREATMENT  · Concussions are usually treated in an emergency department, in urgent care, or at a clinic. You may need to stay in the hospital overnight for further treatment.  · Tell your health care provider if you are taking any medicines, including prescription medicines, over-the-counter medicines, and natural remedies. Some medicines, such as blood thinners (anticoagulants) and aspirin, may increase the chance of complications. Also tell your health care   provider whether you have had alcohol or are taking illegal drugs. This information may affect treatment.  · Your health care provider will send you home with important instructions to follow.  · How fast you will recover from a concussion depends on many factors. These factors include how severe your concussion is, what part of your brain was injured, your age, and how healthy you were before the concussion.  · Most people with mild injuries recover fully. Recovery can take time. In general, recovery is slower in older persons. Also, persons who have had a concussion in the past or have other medical problems may find that it takes longer to recover from their current injury.  HOME  CARE INSTRUCTIONS  General Instructions  · Carefully follow the directions your health care provider gave you.  · Only take over-the-counter or prescription medicines for pain, discomfort, or fever as directed by your health care provider.  · Take only those medicines that your health care provider has approved.  · Do not drink alcohol until your health care provider says you are well enough to do so. Alcohol and certain other drugs may slow your recovery and can put you at risk of further injury.  · If it is harder than usual to remember things, write them down.  · If you are easily distracted, try to do one thing at a time. For example, do not try to watch TV while fixing dinner.  · Talk with family members or close friends when making important decisions.  · Keep all follow-up appointments. Repeated evaluation of your symptoms is recommended for your recovery.  · Watch your symptoms and tell others to do the same. Complications sometimes occur after a concussion. Older adults with a brain injury may have a higher risk of serious complications, such as a blood clot on the brain.  · Tell your teachers, school nurse, school counselor, coach, athletic trainer, or work manager about your injury, symptoms, and restrictions. Tell them about what you can or cannot do. They should watch for:    Increased problems with attention or concentration.    Increased difficulty remembering or learning new information.    Increased time needed to complete tasks or assignments.    Increased irritability or decreased ability to cope with stress.    Increased symptoms.  · Rest. Rest helps the brain to heal. Make sure you:    Get plenty of sleep at night. Avoid staying up late at night.    Keep the same bedtime hours on weekends and weekdays.    Rest during the day. Take daytime naps or rest breaks when you feel tired.  · Limit activities that require a lot of thought or concentration. These include:    Doing homework or job-related  work.    Watching TV.    Working on the computer.  · Avoid any situation where there is potential for another head injury (football, hockey, soccer, basketball, martial arts, downhill snow sports and horseback riding). Your condition will get worse every time you experience a concussion. You should avoid these activities until you are evaluated by the appropriate follow-up health care providers.  Returning To Your Regular Activities  You will need to return to your normal activities slowly, not all at once. You must give your body and brain enough time for recovery.  · Do not return to sports or other athletic activities until your health care provider tells you it is safe to do so.  · Ask   your health care provider when you can drive, ride a bicycle, or operate heavy machinery. Your ability to react may be slower after a brain injury. Never do these activities if you are dizzy.  · Ask your health care provider about when you can return to work or school.  Preventing Another Concussion  It is very important to avoid another brain injury, especially before you have recovered. In rare cases, another injury can lead to permanent brain damage, brain swelling, or death. The risk of this is greatest during the first 7-10 days after a head injury. Avoid injuries by:  · Wearing a seat belt when riding in a car.  · Drinking alcohol only in moderation.  · Wearing a helmet when biking, skiing, skateboarding, skating, or doing similar activities.  · Avoiding activities that could lead to a second concussion, such as contact or recreational sports, until your health care provider says it is okay.  · Taking safety measures in your home.    Remove clutter and tripping hazards from floors and stairways.    Use grab bars in bathrooms and handrails by stairs.    Place non-slip mats on floors and in bathtubs.    Improve lighting in dim areas.  SEEK MEDICAL CARE IF:  · You have increased problems paying attention or  concentrating.  · You have increased difficulty remembering or learning new information.  · You need more time to complete tasks or assignments than before.  · You have increased irritability or decreased ability to cope with stress.  · You have more symptoms than before.  Seek medical care if you have any of the following symptoms for more than 2 weeks after your injury:  · Lasting (chronic) headaches.  · Dizziness or balance problems.  · Nausea.  · Vision problems.  · Increased sensitivity to noise or light.  · Depression or mood swings.  · Anxiety or irritability.  · Memory problems.  · Difficulty concentrating or paying attention.  · Sleep problems.  · Feeling tired all the time.  SEEK IMMEDIATE MEDICAL CARE IF:  · You have severe or worsening headaches. These may be a sign of a blood clot in the brain.  · You have weakness (even if only in one hand, leg, or part of the face).  · You have numbness.  · You have decreased coordination.  · You vomit repeatedly.  · You have increased sleepiness.  · One pupil is larger than the other.  · You have convulsions.  · You have slurred speech.  · You have increased confusion. This may be a sign of a blood clot in the brain.  · You have increased restlessness, agitation, or irritability.  · You are unable to recognize people or places.  · You have neck pain.  · It is difficult to wake you up.  · You have unusual behavior changes.  · You lose consciousness.  MAKE SURE YOU:  · Understand these instructions.  · Will watch your condition.  · Will get help right away if you are not doing well or get worse.     This information is not intended to replace advice given to you by your health care provider. Make sure you discuss any questions you have with your health care provider.     Document Released: 08/31/2003 Document Revised: 07/01/2014 Document Reviewed: 12/31/2012  [employer] Interactive Patient Education ©2016 [employer]

## 2015-04-10 NOTE — ED Provider Notes (Signed)
Time Seen: Approximately 1400 I have reviewed the triage notes  Chief Complaint: Fall   History of Present Illness: Jimmy Carson is a 74 y.o. male who had minor trauma today. The patient was leaning on a car prior to leaving for his dialysis. The daughter did not know that he was leaning against the vehicle and pulled away. The patient fell landing primarily over the back of his head. They also expressed concern over some right knee swelling is been going on for the last several days. With his head trauma, he denies any loss of consciousness, neck pain. Patient denies any significant headache, blurred vision, trouble with speech, or any focal weakness in upper or lower extremities. He does have an abrasion over the occipital area.  Past Medical History  Diagnosis Date  . ESRD (end stage renal disease) ([place])   . Polycystic kidney disease   . Heart attack ([place])   . Prostate CA ([place])   . History of radiation therapy     for prostate CA    There are no active problems to display for this patient.   Past Surgical History  Procedure Laterality Date  . Coronary artery bypass graft    . Aortic valve replacement    . Replacement total knee bilateral      Past Surgical History  Procedure Laterality Date  . Coronary artery bypass graft    . Aortic valve replacement    . Replacement total knee bilateral      No current outpatient prescriptions on file.  Allergies:  Orange fruit and Penicillins  Family History: History reviewed. No pertinent family history.  Social History: Social History  Substance Use Topics  . Smoking status: Former Research scientist (life sciences)  . Smokeless tobacco: None  . Alcohol Use: No     Review of Systems:   10 point review of systems was performed and was otherwise negative:  Constitutional: No fever Eyes: No visual disturbances ENT: No sore throat, ear pain Cardiac: No chest pain Respiratory: No shortness of breath, wheezing, or stridor Abdomen: No  abdominal pain, no vomiting, No diarrhea Endocrine: No weight loss, No night sweats Extremities: Nontraumatic right knee swelling No peripheral edema, cyanosis Skin: No rashes, easy bruising Neurologic: No focal weakness, trouble with speech or swollowing Urologic: No dysuria, Hematuria, or urinary frequency   Physical Exam:  ED Triage Vitals  Enc Vitals Group     BP 04/10/15 1144 159/106 mmHg     Pulse Rate 04/10/15 1144 77     Resp 04/10/15 1144 18     Temp 04/10/15 1144 97.8 F (36.6 C)     Temp Source 04/10/15 1144 Oral     SpO2 04/10/15 1144 94 %     Weight 04/10/15 1144 175 lb (79.379 kg)     Height 04/10/15 1144 5\' 10"  (1.778 m)     Head Cir --      Peak Flow --      Pain Score 04/10/15 1147 4     Pain Loc --      Pain Edu? --      Excl. in [place]? --     General: Awake , Alert , and Oriented times 3; GCS 15. Head: Normal cephalic , patient has abrasion/superficial laceration across the top of his head. No crepitus or step-off is noted. Eyes: Pupils equal , round, reactive to light Nose/Throat: No nasal drainage, patent upper airway without erythema or exudate.  Neck: Supple, Full range of motion, No anterior adenopathy or  palpable thyroid masses Lungs: Clear to ascultation without wheezes , rhonchi, or rales Heart: Regular rate, regular rhythm without murmurs , gallops , or rubs Abdomen: Soft, non tender without rebound, guarding , or rigidity; bowel sounds positive and symmetric in all 4 quadrants. No organomegaly .        Extremities: 2 plus symmetric pulses. Circumferential edema of the right knee., No clubbing or cyanosis. Patella is in normal location and extremities otherwise neurovascularly intact. Neurologic: normal ambulation, Motor symmetric without deficits, sensory intact Skin: warm, dry, no rashes   Radiology:  EXAM: RIGHT LOWER EXTREMITY VENOUS DOPPLER ULTRASOUND  TECHNIQUE: Gray-scale sonography with graded compression, as well as color Doppler and  duplex ultrasound were performed to evaluate the lower extremity deep venous systems from the level of the common femoral vein and including the common femoral, femoral, profunda femoral, popliteal and calf veins including the posterior tibial, peroneal and gastrocnemius veins when visible. The superficial great saphenous vein was also interrogated. Spectral Doppler was utilized to evaluate flow at rest and with distal augmentation maneuvers in the common femoral, femoral and popliteal veins.  COMPARISON: None.  FINDINGS: Contralateral Common Femoral Vein: Respiratory phasicity is normal and symmetric with the symptomatic side. No evidence of thrombus. Normal compressibility.  Common Femoral Vein: No evidence of thrombus. Normal compressibility, respiratory phasicity and response to augmentation.  Saphenofemoral Junction: No evidence of thrombus. Normal compressibility and flow on color Doppler imaging.  Profunda Femoral Vein: No evidence of thrombus. Normal compressibility and flow on color Doppler imaging.  Femoral Vein: No evidence of thrombus. Normal compressibility, respiratory phasicity and response to augmentation.  Popliteal Vein: No evidence of thrombus. Normal compressibility, respiratory phasicity and response to augmentation.  Calf Veins: No evidence of thrombus. Normal compressibility and flow on color Doppler imaging.  Superficial Great Saphenous Vein: No evidence of thrombus. Normal compressibility and flow on color Doppler imaging.  Venous Reflux: None.  Other Findings: There is a subcutaneous 3.9 x 0.7 x 2.4 cm hypoechoic collection in the anterior right shin at the area of a reported recent injury, without internal vascularity.  IMPRESSION: 1. No evidence of deep venous thrombosis in the right lower extremity. 2. Hypoechoic 3.9 x 0.7 x 2.4 cm subcutaneous collection without internal vascularity in the anterior right shin at the area of a reported  recent injury, likely representing a superficial hematoma. Further management, including any decision to pursue additional imaging, should be based on clinical assessment.   Electronically Signed By: Ilona Sorrel M.D. On: 04/10/2015 14:30          CT Head Wo Contrast (Final result) Result time: 04/10/15 12:46:59   Final result by Rad Results In Interface (04/10/15 12:46:59)   Narrative:   CLINICAL DATA: Fall with head injury. Initial encounter.  EXAM: CT HEAD WITHOUT CONTRAST  TECHNIQUE: Contiguous axial images were obtained from the base of the skull through the vertex without intravenous contrast.  COMPARISON: None.  FINDINGS: Skull and Sinuses:Negative for fracture or destructive process. The mastoids, middle ears, and imaged paranasal sinuses are clear.  Orbits: No acute abnormality.  Brain: No evidence of acute infarction, hemorrhage, hydrocephalus, or mass lesion/mass effect.  Intracranial calcified atherosclerosis with vertebrobasilar dolichoectasia.  Cerebral volume is normal for age.  IMPRESSION: No acute finding.         I personally reviewed the radiologic studies      ED Course: Patient's stay here was uneventful. His right knee swelling does not appear to be infectious in nature and I  felt this most likely was simple inflammatory swelling at this time. There is no history of direct trauma to the knee and the Doppler study shows no deep venous thrombosis. His wounds are top of his head was cleaned and dressed and did not require sutures. No CT evidence of subdural or epidural hematoma.   Assessment:  Non-syncopal fall with acute closed head injury Right knee swelling likely inflammatory  Final Clinical Impression:   Final diagnoses:  Head trauma, initial encounter     Plan:  Outpatient management Patient was advised to return immediately if condition worsens. Patient was advised to follow up with her primary care  physician or other specialized physicians involved and in their current assessment.            Daymon Larsen, MD 04/10/15 [phone]

## 2015-04-10 NOTE — ED Notes (Signed)
Patient transported to Ultrasound 

## 2015-04-20 ENCOUNTER — Ambulatory Visit: Payer: Medicare PPO | Attending: Family Medicine

## 2015-04-20 ENCOUNTER — Encounter: Payer: Self-pay | Admitting: Physical Therapy

## 2015-04-20 VITALS — BP 127/68 | HR 73

## 2015-04-20 DIAGNOSIS — R29818 Other symptoms and signs involving the nervous system: Secondary | ICD-10-CM | POA: Diagnosis not present

## 2015-04-20 DIAGNOSIS — R2689 Other abnormalities of gait and mobility: Secondary | ICD-10-CM

## 2015-04-20 NOTE — Therapy (Signed)
[place] MAIN [facility] SERVICES [address] [place], [place], [address] Phone: [phone]   Fax:  [phone]  Physical Therapy Evaluation  Patient Details  Name: Jimmy Carson MRN: [id number] Date of Birth: [birth year] Referring Provider: Dion Body  Encounter Date: 04/20/2015      PT End of Session - 04/20/15 1122    Visit Number 1   Number of Visits 7   Date for PT Re-Evaluation 2015-06-12   Authorization Type g codes every 10th visit/30 days   PT Start Time 1015   PT Stop Time 1102   PT Time Calculation (min) 47 min   Equipment Utilized During Treatment Gait belt   Activity Tolerance Patient tolerated treatment well   Behavior During Therapy [facility] for tasks assessed/performed      Past Medical History  Diagnosis Date  . ESRD (end stage renal disease) ([place])   . Polycystic kidney disease   . Heart attack ([place])   . Prostate CA ([place])   . History of radiation therapy     for prostate CA  . Anemia   . Hyperlipidemia   . CAD (coronary artery disease)     s/p percutaneous coronary angioplasty, s/p CABG x 6   . Hypertension   . Osteoarthritis   . Shingles   . Hyperparathyroidism, secondary renal [facility])     Past Surgical History  Procedure Laterality Date  . Coronary artery bypass graft    . Aortic valve replacement    . Replacement total knee bilateral    . Joint replacement    . Rotator cuff repair Right     Filed Vitals:   04/20/15 1031  BP: 127/68  Pulse: 73    Visit Diagnosis:  Balance disorder - Plan: PT plan of care cert/re-cert      Subjective Assessment - 04/20/15 1142    Subjective "My balance is not very good"   Pertinent History  Pt reports difficulty with his balance over the last 6-10 months. He suffered a fall 04/10/15 where he struck his head and was brought to [facility] ED. No significant findings at ER visit. Pt states that he got dizzy when he stood up out of the car. He leaned on the car but  the driver drove away and pt fell. Pt denies dizziness, vertigo, LOC, syncope since that episode or prior to that episode. Pt denies dizziness with positional changes or head turns.  Denies numbness/tingling in UE/LE but does report some cramping of L thumb after dialysis. Last cardiology visit was July 2016 and no changes were made to medications. Pt reports that he is primarily at therapy to improve his balance.   Currently in Pain? No/denies            [facility] PT Assessment - 04/20/15 0001    Assessment   Medical Diagnosis Concussion with no loss of consciousness, dizziness   Referring Provider Dion Body   Next MD Visit Not reported   Prior Therapy No   Precautions   Precautions Fall   Restrictions   Weight Bearing Restrictions No   Balance Screen   Has the patient fallen in the past 6 months Yes   How many times? Two   Has the patient had a decrease in activity level because of a fear of falling?  No   Is the patient reluctant to leave their home because of a fear of falling?  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 assessed   Observation/Other Assessments   Other Surveys  Other Surveys   Dizziness Handicap Inventory [facility])  24/100   Standardized Balance Assessment   Standardized Balance Assessment Berg Balance Test;Dynamic Gait Index   Berg Balance Test   Sit to Stand Able to stand without using hands and stabilize independently   Standing Unsupported Able to stand safely 2 minutes   Sitting with Back Unsupported but Feet Supported on Floor  or Stool Able to sit safely and securely 2 minutes   Stand to Sit Sits safely with minimal use of hands   Transfers Able to transfer safely, minor use of hands   Standing Unsupported with Eyes Closed Able to stand 10 seconds with supervision   Standing Ubsupported with Feet Together Able to place feet together independently and stand 1 minute safely   From Standing, Reach Forward with Outstretched Arm Can reach forward >12 cm safely (5")  Due to poor shoulder mobility   From Standing Position, Pick up Object from Floor Able to pick up shoe safely and easily   From Standing Position, Turn to Look Behind Over each Shoulder Looks behind from both sides and weight shifts well   Turn 360 Degrees Able to turn 360 degrees safely in 4 seconds or less   Standing Unsupported, Alternately Place Feet on Step/Stool Able to stand independently and safely and complete 8 steps in 20 seconds   Standing Unsupported, One Foot in Front Able to plae foot ahead of the other independently and hold 30 seconds   Standing on One Leg Able to lift leg independently and hold equal to or more than 3 seconds   Total Score 51   Berg comment: difficulty with single leg stance   Dynamic Gait Index   Level Surface Normal   Change in Gait Speed Normal   Gait with Horizontal Head Turns Mild Impairment   Gait with Vertical Head Turns Mild Impairment   Gait and Pivot Turn Moderate Impairment   Step Over Obstacle Mild Impairment   Step Around Obstacles Normal   Steps Mild Impairment   Total Score 18        VESTIBULAR AND BALANCE EVALUATION  Onset Date: "I don't know"  HISTORY:  Symptoms at onset: Imbalance Description of dizziness: (vertigo, unsteadiness, nausea/vomiting, imbalance, lightheadedness, falling, general unsteadiness, aural fullness): swaying, imbalance. Denies dizziness or vertigo Symptom nature: (motion provoked/positional/spontaneous/constant, variable, intermittent): standing still Frequency:  Daily Duration: Unable to answer  Provocative Factors: [place] Easing Factors: Unknown Progression of symptoms: (better, worse, no change since onset): no change History of similar episodes: No Falls (yes/no): Yes Number of falls in past 6 months: 2  Subjective history of current problem: Pt reports difficulty with his balance over the last 6-10 months. He suffered a fall 04/10/15 where he struck his head and was brought to [facility] ED. No significant findings at ER visit. Pt states that he got dizzy when he stood up out of the car. He leaned on the car but the driver drove away and pt fell. Pt denies dizziness,  vertigo, LOC, syncope since that episode or prior to that episode. Pt denies dizziness with positional changes or head turns.  Denies numbness/tingling in UE/LE but does report some cramping of L thumb after dialysis. Last cardiology visit was July 2016 and no changes were made to medications. Pt reports that he is primarily at therapy to improve his balance. Subjective Prior Functional Level: No reported deficits. Using spc in LUE x 6 months intermittently for long distance ambulation.  Auditory complaints (tinnitus, pain, drainage): No Vision: (last eye exam, diplopia, recent changes): No, last visit check 1 years ago. Pt wears contacts. No recent changes to vision prescription.  Migraines/headaches: No Red Flags (dysarthria, dysphagia, drop attacks, diplopia, bowel and bladder changes, recent weight loss/gain, chills/fevers, history of cancer, focal weakness, numbness/tingling): Denies red flags. Pt does report infrequent L knee buckling but no drop attacks.    EXAMINATION:   POSTURE:  Forward head posture. Overall appears to be appropriate weight for height.  NEUROLOGICAL SCREEN: (2+ unless otherwise noted.)  Level Dermatome R L Myotome R L Reflex R L  C3 Anterior Neck N N Sidebend C2-3   Hoffman's UMN    C4 Top of Shoulder N FF N N Shoulder Shrug C4   Jaw CN V    C5 Lateral Upper  Arm N N Shoulder ABD C4-5   Biceps C5-6    C6 Lateral Arm/ Thumb N N N Arm Flex/ Wrist Ext C5-6 N N Brachiorad. C5-6    C7 Middle Finger N N N Arm Ext//Wrist Flex C6-7 N N Triceps C7    C8 4th & 5th Finger N N Flex/ Ext Carpi Ulnaris C8 N N Patella L3-4    T1 Medial Arm N N Interossei T1 N N Gastrocnemius S1-S2    L2 Medial thigh/groin N N Illiopsoas (L2-3) N N     L3 Lower thigh/med.knee N N Quadriceps (L3-4) N N Clonus: R L  L4 Medial leg/lat thigh N N Tibialis Ant (L4-5) N N Wrist flexor    L5 Lat. leg & dorsal foot N N EHL (L5)   Gastrocnemius (S1)    S1 post/lat foot/thigh/leg N N Gastrocnemius (S1-2) N N     S2 Post./med. thigh & leg N N Hamstrings (L4-S3) N N Babinski     Reflex testing deferred due to time constraints. No focal weakness or N/T identified.   N=normal  Ab=abnormal  Clonus = 4+ (Reflexes only)  Hyper = 3+      Normal = 2+     Hypo = 1+   Absent=0  SOMATOSENSORY:        Sensation           Intact      Diminished         Absent    Hypersensitive  Light touch N     Sharp / dull      Proprioception      Kinesthesia      Other:       Any N & T in extremities or weakness:      COORDINATION: Rapid Alt Movements:  Normal Finger to Nose: Unable to perform due to shoulder difficulties Pronator Drift: Unable to perform due to shoulder difficulties      MUSCULOSKELETAL SCREEN:  Cervical Spine ROM: Mild to moderate restriction in AROM in all planes and painful. Denies dizziness with AROM cervical spine  UE/LE ROM: Severe loss of bilateral shoulder flexion AROM and painful R worse than L. Pt reports he is planning  to have R TSR in the next "couple" months and then the L subsequently.   MMT: Shoulders not tested due to pain. Bilateral elbow strength testing appears to be intact but limited due to shoulder pain. Grip and hand strength WNL. LE strength is grossly WNL and at least 4+/5 throughout  Functional Mobility:   Gait: Pt with L antalgic gait with single  point cane in RUE. Gait speed is slightly decreased but functional for limited community mobility   Scanning of visual environment with gait is: Good  Balance: See BERG and DGI.    Clinical Test of Sensory Interaction for Balance    (CTSIB):  CONDITION TIME STRATEGY SWAY  Eyes open, firm surface 30  1+  Eyes closed, firm surface 15  3+  Eyes open, foam surface     Eyes closed, foam surface        OCULOMOTOR / VESTIBULAR TESTING:  Oculomotor Exam- Room Light  Normal Abnormal Comments  Ocular Alignment N    Ocular ROM N    Spontaneous Nystagmus N    End-Gaze Nystagmus N    Smooth Pursuit N  Pt with some mid range horizontal nystagmus L with L gaze and R with R gaze which is likely age appropriate.   Saccades N    VOR N    VOR Cancellation N    Left Head Thrust  A Mild positive with corrective saccade. Denies dizziness  Right Head Thrust  A Mild positive with corrective saccade. Denies dizziness  Head Shaking Nystagmus   Deferred due to neck pain  Static Acuity     Dynamic Acuity      BPPV TESTS:  Symptoms Duration Intensity Nystagmus  L Dix-Hallpike Negative   Negative  R Dix-Hallpike Vertigo/oscillpsia 5-6 seconds 5-6/10 5-10 beats of upbeating nystagmus, no true torsional component observed. Confirmed by second therapist  L Head Roll      R Head Roll      L Sidelying Test      R Sidelying Test        MEASURES:  Results Comments  DHI    ABC Scale    DGI 18/24   10 meter Walking Speed    FTSTS    SLS    Berg Balance Test 51/56   TUG    TUG- Carry    TUG- Cognitive       TREATMENT Performed CRT x 1 for possible R posterior canal BPPV. 60 second holds in each position. Repeat Dix-Hallpike testing on R side is negative for nystagmus and symptoms. Will retest at next visit.                    PT Education - 04/20/15 1121    Education provided Yes   Education Details Plan of care, BPPV, No HEP issued due to late start   Person(s) Educated  Patient   Methods Explanation;Demonstration   Comprehension Verbalized understanding             PT Long Term Goals - 04/20/15 1151    PT LONG TERM GOAL #1   Title Pt will be independent with HEP in order to manage balance dysfunction and decrease fall risk by 06/02/15   Status New   PT LONG TERM GOAL #2   Title Pt will improve DGI by at least 3 points in order to demonstrates clinically significant improvement in balance by 06/02/15   Baseline 04/20/15: 18/24   Status New   PT LONG TERM GOAL #  3   Title Pt will improve single leg balance to >10 seconds in order to decrease risk for falls by 06/02/15   Baseline 2015/05/01: 4 seconds   Status New               Plan - 2015-05-01 1143    Clinical Impression Statement Pt is a pleasant 74 year-old Caucasian male referred for dizziness and recent fall with concussion. During evaluation pt denies any dizziness or vertigo but states that he just has difficulty with his balance. PT evaluation today reveals deficits in balance with 51/56 on BERG and 18/24 on DGI. Vestibular examination is relatively normal with the exception of mild positive bilateral head thrust but no reports of dizziness. Dix-hallpike testing is negative on the L and on the R pt presents with upbeating nystagmus and vertigo. Upbeating nystagmus confirmed by second therapist. No rotational component observed  however subtle rotational compoenent can be difficulty to observe without true VNG testing. Pt treated with Epley maneuver with resolution of nystagmus and vertigo with repeat testing. Will retest at next visit and if symptoms/nystagmus does not resolve with Epley pt may benefti from neurological workup as upbeating nystagmus can be indication of central etiology. Pt will benefit from vestibular therapy to improve balance and decrease risk of falls in order to improve community and household mobility.    Pt will benefit from skilled therapeutic intervention in order to  improve on the following deficits Decreased balance   Rehab Potential Good   Clinical Impairments Affecting Rehab Potential positive: motivation, fairly recent onset, negative: age   PT Frequency 1x / week   PT Duration 6 weeks   PT Treatment/Interventions Canalith Repostioning;DME Instruction;Gait training;Therapeutic activities;Therapeutic exercise;Balance training;Neuromuscular re-education;Patient/family education;Manual techniques;Vestibular   PT Next Visit Plan Have pt complete ABC, repeat Dix-Hallpike to assess R side and treat as indicated (see examination). Issue HEP   PT Home Exercise Plan None issued today due to time constraints   Consulted and Agree with Plan of Care Patient          G-Codes - 05-01-15 1155    Functional Assessment Tool Used DGI, DHI, BERG, clinical judgment   Functional Limitation Mobility: Walking and moving around   Mobility: Walking and Moving Around Current Status [id number]) At least 20 percent but less than 40 percent impaired, limited or restricted   Mobility: Walking and Moving Around Goal Status [phone]) At least 1 percent but less than 20 percent impaired, limited or restricted       Problem List There are no active problems to display for this patient.  Phillips Grout PT, DPT   Huprich,Jason 05-01-15, 12:00 PM  [place] MAIN [facility] SERVICES [address] [place], [place], [address] Phone: [phone]   Fax:  [phone]  Name: Kastyn Steltenpohl MRN: [id number] Date of Birth: 1941-03-24

## 2015-04-27 ENCOUNTER — Ambulatory Visit: Payer: Medicare PPO | Attending: Family Medicine

## 2015-04-27 DIAGNOSIS — R29818 Other symptoms and signs involving the nervous system: Secondary | ICD-10-CM | POA: Diagnosis present

## 2015-04-27 DIAGNOSIS — R2689 Other abnormalities of gait and mobility: Secondary | ICD-10-CM

## 2015-04-27 DIAGNOSIS — R42 Dizziness and giddiness: Secondary | ICD-10-CM | POA: Diagnosis present

## 2015-04-27 NOTE — Patient Instructions (Addendum)
Feet Heel-Toe "Tandem", Head Motion - Eyes Open    With eyes open, right foot directly in front of the other, move head slowly: left and right. Perform for 30 seconds. Repeat __3__ times per session. Do _3___ sessions per day.    Single Leg - Eyes Open    Do not hold onto support. Lift right leg while maintaining balance over other leg. Hold for total of _30__ seconds. Repeat _3___ times per session per leg. Do _3___ sessions per day.

## 2015-04-27 NOTE — Therapy (Addendum)
[place] MAIN [facility] SERVICES [address] [place], [place], [address] Phone: [phone]   Fax:  [phone]  Physical Therapy Treatment  Patient Details  Name: Jimmy Carson MRN: [id number] Date of Birth: [birth year] Referring Provider: Dion Body  Encounter Date: 04/27/2015      PT End of Session - 04/27/15 0939    Visit Number 2   Number of Visits 7   Date for PT Re-Evaluation June 18, 2015   Authorization Type g codes every 10th visit/30 days   PT Start Time 0935   PT Stop Time 1015   PT Time Calculation (min) 40 min   Equipment Utilized During Treatment Gait belt   Activity Tolerance Patient tolerated treatment well   Behavior During Therapy [facility] for tasks assessed/performed      Past Medical History  Diagnosis Date  . ESRD (end stage renal disease) ([place])   . Polycystic kidney disease   . Heart attack ([place])   . Prostate CA ([place])   . History of radiation therapy     for prostate CA  . Anemia   . Hyperlipidemia   . CAD (coronary artery disease)     s/p percutaneous coronary angioplasty, s/p CABG x 6   . Hypertension   . Osteoarthritis   . Shingles   . Hyperparathyroidism, secondary renal [facility])     Past Surgical History  Procedure Laterality Date  . Coronary artery bypass graft    . Aortic valve replacement    . Replacement total knee bilateral    . Joint replacement    . Rotator cuff repair Right     There were no vitals filed for this visit.  Visit Diagnosis:  Balance disorder  Dizziness and giddiness      Subjective Assessment - 04/27/15 0936    Subjective Pt reports that he has not had any bouts of dizziness or vertigo since his initial evaluation. He has noticed that he consistently veers to the L when he is walking. No specific questions or concerns at this time.    Pertinent History  Pt reports difficulty with his balance over the last 6-10 months. He suffered a fall 04/10/15 where he struck his  head and was brought to [facility] ED. No significant findings at ER visit. Pt states that he got dizzy when he stood up out of the car. He leaned on the car but the driver drove away and pt fell. Pt denies dizziness, vertigo, LOC, syncope since that episode or prior to that episode. Pt denies dizziness with positional changes or head turns.  Denies numbness/tingling in UE/LE but does report some cramping of L thumb after dialysis. Last cardiology visit was July 2016 and no changes were made to medications. Pt reports that he is primarily at therapy to improve his balance.   Currently in Pain? Yes   Pain Score 4    Pain Location Knee   Pain Orientation Right   Pain Type Chronic pain       Neuromuscular Reeducation:  Subjective and HEP reviewed with patient. Pt completed ABC questionnaire (2 min unbilled to complete questionnaire); Dix-Hallpike repeated with negative tests on R and L.   VOR VOR X 1 horizontal in sitting 3 reps of 1 minute each. Instruction provided for speed and rotational excursion. 1/10 dizziness reported, issued as part of HEP;  Airex NBOS eyes open/closed x 30 seconds each; NBOS no UE support eyes open horizontal and vertical head turns x 30 seconds each; added head and  body turns x 30 seconds Denies dizziness; Vertical ball toss to self x 60 seconds; Ball pass from hand to hand (horizontal) x 60 seconds; Pt denies dizziness but has considerable imbalance;  Head Turn Progression On firm surface semi-tandem no UE support eyes open horizontal head turns alternating LE forward, added head and body turns. Denies dizziness;  Ambulation in Hallway Forward ambulation with horizontal and vertical head turns 85' x 2 each;               [facility] PT Assessment - 04/27/15 0001    Observation/Other Assessments   Other Surveys  Other Surveys   Activities of Balance Confidence Scale (ABC Scale)  70.625%                             PT Education -  04/27/15 [phone]    Education provided Yes   Education Details HEP issued   Person(s) Educated Patient   Methods Explanation;Demonstration;Handout   Comprehension Verbalized understanding;Returned demonstration             PT Long Term Goals - 04/27/15 1437    PT LONG TERM GOAL #1   Title Pt will be independent with HEP in order to manage balance dysfunction and decrease fall risk by 06/02/15   Status New   PT LONG TERM GOAL #2   Title Pt will improve DGI by at least 3 points in order to demonstrates clinically significant improvement in balance by 06/02/15   Baseline 04/20/15: 18/24   Status New   PT LONG TERM GOAL #3   Title Pt will improve single leg balance to >10 seconds in order to decrease risk for falls by 06/02/15   Baseline 04/20/15: 4 seconds   Status New   PT LONG TERM GOAL #4   Title Pt will improve ABC by at least 13% in order to demonstrate clinically significant improvement in balance by 06/02/15   Baseline 04/27/15: 70.625%   Status New               Plan - 04/27/15 0940    Clinical Impression Statement Negative Dix-Hallpike testing on this date. Pt issued VOR x 1 horizontal and provided cues and education about proper performance. Pt reports mild dizziness with VOR x 1 as well as some horizontal oscillopsia after he finishes but it resolves quickly. Started pt with standing exercises including semi-tandem progressions with head turning. Pt does have difficulty with balance during head turning activities. During next session will progress head turns with ambulation and consider adding ball tosses. Pt encouraged to follow-up as scheduled.    Pt will benefit from skilled therapeutic intervention in order to improve on the following deficits Decreased balance;Dizziness   Rehab Potential Good   Clinical Impairments Affecting Rehab Potential positive: motivation, fairly recent onset, negative: age   PT Frequency 1x / week   PT Duration 6 weeks   PT  Treatment/Interventions Canalith Repostioning;DME Instruction;Gait training;Therapeutic activities;Therapeutic exercise;Balance training;Neuromuscular re-education;Patient/family education;Manual techniques;Vestibular   PT Next Visit Plan Repeat Dix-Hallpike if indicated by history, progress ambulation with head turns and consider adding ball tosses during ambulation, semi-tandem to tandem progression, progress balance exercises.    PT Home Exercise Plan VOR x 1 horizontal in sitting, semi-tandem progression with head turns, single leg balance.    Consulted and Agree with Plan of Care Patient        Problem List There are no active problems to display for this patient.  Phillips Grout PT, DPT   Yousuf Ager 04/27/2015, 2:38 PM  [place] MAIN [facility] SERVICES [address] [place], [place], [address] Phone: [phone]   Fax:  [phone]  Name: Ollis Wemhoff MRN: [id number] Date of Birth: [birth year]

## 2015-05-02 ENCOUNTER — Ambulatory Visit: Payer: Medicare PPO | Admitting: Physical Therapy

## 2015-05-02 ENCOUNTER — Encounter: Payer: Self-pay | Admitting: Physical Therapy

## 2015-05-02 DIAGNOSIS — R2689 Other abnormalities of gait and mobility: Secondary | ICD-10-CM

## 2015-05-02 DIAGNOSIS — R29818 Other symptoms and signs involving the nervous system: Secondary | ICD-10-CM | POA: Diagnosis not present

## 2015-05-02 DIAGNOSIS — R42 Dizziness and giddiness: Secondary | ICD-10-CM

## 2015-05-02 NOTE — Therapy (Signed)
[place] MAIN [facility] SERVICES [address] [place], [place], [address] Phone: [phone]   Fax:  [phone]  Physical Therapy Treatment  Patient Details  Name: Jimmy Carson MRN: [id number] Date of Birth: 03-04-1941 Referring Provider: Dion Body  Encounter Date: 05/02/2015      PT End of Session - 05/02/15 1420    Visit Number 3   Number of Visits 7   Date for PT Re-Evaluation 06/08/2015   Authorization Type g codes every 10th visit/30 days   PT Start Time 1005   PT Stop Time 1047   PT Time Calculation (min) 42 min   Equipment Utilized During Treatment Gait belt   Activity Tolerance Patient tolerated treatment well   Behavior During Therapy [facility] for tasks assessed/performed      Past Medical History  Diagnosis Date  . ESRD (end stage renal disease) ([place])   . Polycystic kidney disease   . Heart attack ([place])   . Prostate CA ([place])   . History of radiation therapy     for prostate CA  . Anemia   . Hyperlipidemia   . CAD (coronary artery disease)     s/p percutaneous coronary angioplasty, s/p CABG x 6   . Hypertension   . Osteoarthritis   . Shingles   . Hyperparathyroidism, secondary renal [facility])     Past Surgical History  Procedure Laterality Date  . Coronary artery bypass graft    . Aortic valve replacement    . Replacement total knee bilateral    . Joint replacement    . Rotator cuff repair Right     There were no vitals filed for this visit.  Visit Diagnosis:  Balance disorder  Dizziness and giddiness      Subjective Assessment - 05/02/15 1008    Subjective Pt states he feels he is walking better denies dizziness. Pt reports he has been doing his HEP and states he has a difficult time trying to do SLS.    Currently in Pain? Yes   Pain Score 4    Pain Location --  neck and bilateral shoulders    Pain Type Chronic pain   Multiple Pain Sites Yes      Neuromuscular Re-education: VOR  exercise: Pt reported that he had been having neck discomfort after doing VOR exercise at home. Demonstrated chin tuck while doing VOR X 1 exercise to patient. In standing on firm surface, pt performed VOR X 1 horiz 3 reps of 1 minute with using chin technique. Pt required one cue to maintain chin tuck.    Airex pad: On firm surface and then on Airex pad, performed semi-tandem progressions with alternate lead leg with and without horiz head turns with CGA.   On firm surface: On firm surface performed slow marching with 3-5 second holds 8 reps each leg secondary to fatigue with CGA. Pt with several small LOB whereby he touched // bar for support.     Diona Foley toss to self:  Performed static ball toss to self horiz while turning head and eyes to follow ball. Then, worked on 48' trials of forward ambulation while doing ball toss to self horiz with CGA. Pt without LOB with this activity.    Walking head turns: Performed 79' trials each of forwards and retro ambulation with and without vert and horiz head turns with CGA. Pt reports retro ambulation was difficult but reports no dizziness with this activity.     On Airex balance beam: Pt performed  sidestepping L/R without head turns and then with horiz head turns 4 reps times 5' each with CGA. Performed on firm surface 2 reps of 6' and then on Airex balance beam tandem walking 6' times 4 reps with CGA. Pt had several times when he would press back of his hands against // bar for support or would step off balance beam in order to regain his balance.        PT Education - 05/02/15 1419    Education provided Yes   Education Details Adjusted VOR X 1 to include chin tuck and perform in standing or sitting; added semi-tandem progressions on pillow and slow marching on firm surface to HEP.    Person(s) Educated Patient   Methods Explanation;Demonstration;Handout   Comprehension Returned demonstration;Verbalized understanding             PT Long  Term Goals - 04/27/15 1437    PT LONG TERM GOAL #1   Title Pt will be independent with HEP in order to manage balance dysfunction and decrease fall risk by 06/02/15   Status New   PT LONG TERM GOAL #2   Title Pt will improve DGI by at least 3 points in order to demonstrates clinically significant improvement in balance by 06/02/15   Baseline 04/20/15: 18/24   Status New   PT LONG TERM GOAL #3   Title Pt will improve single leg balance to >10 seconds in order to decrease risk for falls by 06/02/15   Baseline 04/20/15: 4 seconds   Status New   PT LONG TERM GOAL #4   Title Pt will improve ABC by at least 13% in order to demonstrate clinically significant improvement in balance by 06/02/15   Baseline 04/27/15: 70.625%   Status New               Plan - 05/02/15 1421    Clinical Impression Statement Pt reports that he has not had any dizziness since last visit but would like to continue to work on improving his balance. Pt reported that he has been having some neck discomfort with doing the VOR X1 exercise. Therefore, adjusted VOR exercise to include a chin tuck and pt was able to perform in clinic this date and pt reported that this position helped and that he did not have neck discomfort when using this technique. Pt demonstrated difficulty with Airex balance beam, semi-tandem progressions of foam and slow marching activities this date. Pt encouraged to continue to perform HEP and to follow-up as scheduled.     Pt will benefit from skilled therapeutic intervention in order to improve on the following deficits Decreased balance;Dizziness   Rehab Potential Good   Clinical Impairments Affecting Rehab Potential positive: motivation, fairly recent onset, negative: age   PT Frequency 1x / week   PT Duration 6 weeks   PT Treatment/Interventions Canalith Repostioning;DME Instruction;Gait training;Therapeutic activities;Therapeutic exercise;Balance training;Neuromuscular re-education;Patient/family  education;Manual techniques;Vestibular   PT Next Visit Plan Continue working on progressions of balance activities; consider trying balance activties on 2" X4" board and trying alternate toe taps to targets while on firm and compliant surfaces.    PT Home Exercise Plan VOR x 1 horizontal in sitting, semi-tandem progression with head turns, single leg balance. Adjusted VOR X 1 to include chin tuck and perform in standing or sitting; added semi-tandem progressions on pillow and slow marching on firm surface to HEP.    Consulted and Agree with Plan of Care Patient  Problem List There are no active problems to display for this patient.  Lady Deutscher PT, DPT Lady Deutscher 05/02/2015, 2:26 PM  [place] MAIN [facility] SERVICES [address] [place], [place], [address] Phone: [phone]   Fax:  [phone]  Name: Chadrick Pfaff MRN: [id number] Date of Birth: 05-22-1941

## 2015-05-11 ENCOUNTER — Encounter: Payer: Medicare PPO | Admitting: Physical Therapy

## 2015-05-16 ENCOUNTER — Ambulatory Visit: Payer: Medicare PPO | Admitting: Physical Therapy

## 2015-05-16 ENCOUNTER — Encounter: Payer: Self-pay | Admitting: Physical Therapy

## 2015-05-16 DIAGNOSIS — R2689 Other abnormalities of gait and mobility: Secondary | ICD-10-CM

## 2015-05-16 DIAGNOSIS — R42 Dizziness and giddiness: Secondary | ICD-10-CM

## 2015-05-16 DIAGNOSIS — R29818 Other symptoms and signs involving the nervous system: Secondary | ICD-10-CM | POA: Diagnosis not present

## 2015-05-16 NOTE — Therapy (Signed)
[place] MAIN [facility] SERVICES [address] [place], [place], [address] Phone: [phone]   Fax:  [phone]  Physical Therapy Treatment  Patient Details  Name: Jimmy Carson MRN: [id number] Date of Birth: [birth year] Referring Provider: Dion Body  Encounter Date: 05/16/2015      PT End of Session - 05/16/15 1248    Visit Number 4   Number of Visits 7   Date for PT Re-Evaluation 2015/06/16   Authorization Type g codes every 10th visit   PT Start Time 1120   PT Stop Time 1204   PT Time Calculation (min) 44 min   Equipment Utilized During Treatment Gait belt   Activity Tolerance Patient tolerated treatment well   Behavior During Therapy [facility] for tasks assessed/performed      Past Medical History  Diagnosis Date  . ESRD (end stage renal disease) ([place])   . Polycystic kidney disease   . Heart attack ([place])   . Prostate CA ([place])   . History of radiation therapy     for prostate CA  . Anemia   . Hyperlipidemia   . CAD (coronary artery disease)     s/p percutaneous coronary angioplasty, s/p CABG x 6   . Hypertension   . Osteoarthritis   . Shingles   . Hyperparathyroidism, secondary renal [facility])     Past Surgical History  Procedure Laterality Date  . Coronary artery bypass graft    . Aortic valve replacement    . Replacement total knee bilateral    . Joint replacement    . Rotator cuff repair Right     There were no vitals filed for this visit.  Visit Diagnosis:  Balance disorder  Dizziness and giddiness      Subjective Assessment - 05/16/15 1123    Subjective Pt reports that he feels his balance is better. Pt states he did a lot of walking this past week while out of town and states he did not have any difficulty.    Pertinent History  Pt reports difficulty with his balance over the last 6-10 months. He suffered a fall 04/10/15 where he struck his head and was brought to [facility] ED. No significant findings at ER  visit. Pt states that he got dizzy when he stood up out of the car. He leaned on the car but the driver drove away and pt fell. Pt denies dizziness, vertigo, LOC, syncope since that episode or prior to that episode. Pt denies dizziness with positional changes or head turns.  Denies numbness/tingling in UE/LE but does report some cramping of L thumb after dialysis. Last cardiology visit was July 2016 and no changes were made to medications. Pt reports that he is primarily at therapy to improve his balance.   Currently in Pain? Yes   Pain Location Foot   Pain Orientation Left   Pain Type Chronic pain   Pain Onset More than a month ago      Neuromuscular Re-education:   2" X 4" board:   On 2" X 4" board worked on sidestepping L/R with and without horiz head turns and forward tandem walking 8' times multiple reps of each type with CGA. Pt had several small LOB when attempting head turns.    Cone tapping:   On AirEx pad, pt performed alternate foot cone tapping in series of one, two and three cones as called out by therapist.    Rockerboard: On large wooden rocker board, worked on [employer] and A/P sways  with and without horiz and vert head turns with CGA.     foam roll: On 1/2 foam roll with flat side down and then round side down worked on static holds for 3-5 minutes each with and without alternate UEs reaching side to side across body.   Karioke Ambulation: [place]/karioke walking L/R 60' times 2 reps with CGA. Pt with a few LOB that he was able to self-correct.         PT Education - 05/16/15 1247    Education provided Yes   Education Details Added exercise to HEP and progression to HEP.    Person(s) Educated Patient   Methods Explanation;Demonstration;Handout   Comprehension Verbalized understanding             PT Long Term Goals - 04/27/15 1437    PT LONG TERM GOAL #1   Title Pt will be independent with HEP in order to manage balance dysfunction and decrease fall risk by  06/02/15   Status New   PT LONG TERM GOAL #2   Title Pt will improve DGI by at least 3 points in order to demonstrates clinically significant improvement in balance by 06/02/15   Baseline 04/20/15: 18/24   Status New   PT LONG TERM GOAL #3   Title Pt will improve single leg balance to >10 seconds in order to decrease risk for falls by 06/02/15   Baseline 04/20/15: 4 seconds   Status New   PT LONG TERM GOAL #4   Title Pt will improve ABC by at least 13% in order to demonstrate clinically significant improvement in balance by 06/02/15   Baseline 04/27/15: 70.625%   Status New               Plan - 05/16/15 1248    Clinical Impression Statement Pt limited at times with balance exercises secondary to chronic arthritis pain in left foot. Pt did well with high level balance activities but was challenged when added in horiz and vert head turn components. Continue PT services per POC with emphasis on balance activity progressions per pt tolerance.    Pt will benefit from skilled therapeutic intervention in order to improve on the following deficits Decreased balance;Dizziness   Rehab Potential Good   Clinical Impairments Affecting Rehab Potential positive: motivation, fairly recent onset, negative: age   PT Frequency 1x / week   PT Duration 6 weeks   PT Treatment/Interventions Canalith Repostioning;DME Instruction;Gait training;Therapeutic activities;Therapeutic exercise;Balance training;Neuromuscular re-education;Patient/family education;Manual techniques;Vestibular   PT Next Visit Plan Consider retesting functional outcome measures next visit.    PT Home Exercise Plan VOR x 1 horizontal in sitting, semi-tandem progression with head turns, single leg balance. Adjusted VOR X 1 to include chin tuck and perform in standing or sitting; added semi-tandem progressions on pillow and slow marching on firm surface to HEP. Added horiz and vert head turns to semitandem stance on firm and pillow as well as  sidestepping with horiz head turns to HEP.    Consulted and Agree with Plan of Care Patient        Problem List There are no active problems to display for this patient.  Lady Deutscher PT, DPT Lady Deutscher 05/16/2015, 2:40 PM  [place] MAIN [facility] SERVICES [address] [place], [place], [address] Phone: [phone]   Fax:  [phone]  Name: Jimmy Carson MRN: [id number] Date of Birth: 04-Jul-1940

## 2015-05-23 ENCOUNTER — Encounter: Payer: Self-pay | Admitting: Physical Therapy

## 2015-05-23 ENCOUNTER — Ambulatory Visit: Payer: Medicare PPO | Admitting: Physical Therapy

## 2015-05-23 DIAGNOSIS — R2689 Other abnormalities of gait and mobility: Secondary | ICD-10-CM

## 2015-05-23 DIAGNOSIS — R29818 Other symptoms and signs involving the nervous system: Secondary | ICD-10-CM | POA: Diagnosis not present
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
## 2015-06-01 ENCOUNTER — Encounter: Payer: Medicare PPO | Admitting: Physical Therapy

## 2015-07-18 DIAGNOSIS — I340 Nonrheumatic mitral (valve) insufficiency: Secondary | ICD-10-CM | POA: Insufficient documentation

## 2015-07-21 ENCOUNTER — Inpatient Hospital Stay
Admission: EM | Admit: 2015-07-21 | Discharge: 2015-07-24 | DRG: 280 | Disposition: A | Discharge status: Home / Self Care | Payer: Medicare Other | Attending: Internal Medicine | Admitting: Internal Medicine

## 2015-07-21 ENCOUNTER — Encounter: Payer: Self-pay | Admitting: Emergency Medicine

## 2015-07-21 ENCOUNTER — Emergency Department: Payer: Medicare Other

## 2015-07-21 DIAGNOSIS — Z952 Presence of prosthetic heart valve: Secondary | ICD-10-CM

## 2015-07-21 DIAGNOSIS — N186 End stage renal disease: Secondary | ICD-10-CM | POA: Diagnosis present

## 2015-07-21 DIAGNOSIS — Z96653 Presence of artificial knee joint, bilateral: Secondary | ICD-10-CM | POA: Diagnosis present

## 2015-07-21 DIAGNOSIS — M1990 Unspecified osteoarthritis, unspecified site: Secondary | ICD-10-CM | POA: Diagnosis present

## 2015-07-21 DIAGNOSIS — Z880 Allergy status to penicillin: Secondary | ICD-10-CM

## 2015-07-21 DIAGNOSIS — I120 Hypertensive chronic kidney disease with stage 5 chronic kidney disease or end stage renal disease: Secondary | ICD-10-CM | POA: Diagnosis present

## 2015-07-21 DIAGNOSIS — I214 Non-ST elevation (NSTEMI) myocardial infarction: Principal | ICD-10-CM | POA: Diagnosis present

## 2015-07-21 DIAGNOSIS — Z8546 Personal history of malignant neoplasm of prostate: Secondary | ICD-10-CM

## 2015-07-21 DIAGNOSIS — Z992 Dependence on renal dialysis: Secondary | ICD-10-CM

## 2015-07-21 DIAGNOSIS — Z951 Presence of aortocoronary bypass graft: Secondary | ICD-10-CM

## 2015-07-21 DIAGNOSIS — I25110 Atherosclerotic heart disease of native coronary artery with unstable angina pectoris: Secondary | ICD-10-CM | POA: Diagnosis present

## 2015-07-21 DIAGNOSIS — D631 Anemia in chronic kidney disease: Secondary | ICD-10-CM | POA: Diagnosis present

## 2015-07-21 DIAGNOSIS — N2581 Secondary hyperparathyroidism of renal origin: Secondary | ICD-10-CM | POA: Diagnosis present

## 2015-07-21 DIAGNOSIS — Z7982 Long term (current) use of aspirin: Secondary | ICD-10-CM

## 2015-07-21 DIAGNOSIS — E039 Hypothyroidism, unspecified: Secondary | ICD-10-CM | POA: Diagnosis present

## 2015-07-21 DIAGNOSIS — Z923 Personal history of irradiation: Secondary | ICD-10-CM

## 2015-07-21 DIAGNOSIS — Z79899 Other long term (current) drug therapy: Secondary | ICD-10-CM

## 2015-07-21 DIAGNOSIS — N400 Enlarged prostate without lower urinary tract symptoms: Secondary | ICD-10-CM | POA: Diagnosis present

## 2015-07-21 DIAGNOSIS — Q613 Polycystic kidney, unspecified: Secondary | ICD-10-CM

## 2015-07-21 DIAGNOSIS — R079 Chest pain, unspecified: Secondary | ICD-10-CM | POA: Diagnosis present

## 2015-07-21 DIAGNOSIS — Z87891 Personal history of nicotine dependence: Secondary | ICD-10-CM

## 2015-07-21 DIAGNOSIS — E782 Mixed hyperlipidemia: Secondary | ICD-10-CM | POA: Diagnosis present

## 2015-07-21 DIAGNOSIS — I2510 Atherosclerotic heart disease of native coronary artery without angina pectoris: Secondary | ICD-10-CM | POA: Diagnosis present

## 2015-07-21 DIAGNOSIS — Z7902 Long term (current) use of antithrombotics/antiplatelets: Secondary | ICD-10-CM

## 2015-07-21 DIAGNOSIS — E785 Hyperlipidemia, unspecified: Secondary | ICD-10-CM | POA: Diagnosis present

## 2015-07-21 DIAGNOSIS — I252 Old myocardial infarction: Secondary | ICD-10-CM

## 2015-07-21 DIAGNOSIS — Z9861 Coronary angioplasty status: Secondary | ICD-10-CM

## 2015-07-21 DIAGNOSIS — Z91018 Allergy to other foods: Secondary | ICD-10-CM

## 2015-07-21 LAB — BASIC METABOLIC PANEL
Anion gap: 8 (ref 5–15)
BUN: 13 mg/dL (ref 6–20)
CHLORIDE: 97 mmol/L — AB (ref 101–111)
CO2: 37 mmol/L — ABNORMAL HIGH (ref 22–32)
Calcium: 8.1 mg/dL — ABNORMAL LOW (ref 8.9–10.3)
Creatinine, Ser: 3.69 mg/dL — ABNORMAL HIGH (ref 0.61–1.24)
GFR calc Af Amer: 17 mL/min — ABNORMAL LOW (ref >60)
GFR, EST NON AFRICAN AMERICAN: 15 mL/min — AB (ref >60)
Glucose, Bld: 180 mg/dL — ABNORMAL HIGH (ref 65–99)
POTASSIUM: 3.9 mmol/L (ref 3.5–5.1)
SODIUM: 142 mmol/L (ref 135–145)

## 2015-07-21 LAB — CBC
HCT: 33.6 % — ABNORMAL LOW (ref 40.0–52.0)
Hemoglobin: 10.8 g/dL — ABNORMAL LOW (ref 13.0–18.0)
MCH: 28.6 pg (ref 26.0–34.0)
MCHC: 32.3 g/dL (ref 32.0–36.0)
MCV: 88.7 fL (ref 80.0–100.0)
PLATELETS: 194 10*3/uL (ref 150–440)
RBC: 3.79 MIL/uL — AB (ref 4.40–5.90)
RDW: 20.2 % — ABNORMAL HIGH (ref 11.5–14.5)
WBC: 6 10*3/uL (ref 3.8–10.6)

## 2015-07-21 LAB — APTT: APTT: 39 s — AB (ref 24–36)

## 2015-07-21 LAB — PROTIME-INR
INR: 1.09
PROTHROMBIN TIME: 14.3 s (ref 11.4–15.0)

## 2015-07-21 LAB — TROPONIN I: Troponin I: <0.03 ng/mL (ref <0.031)

## 2015-07-21 MED ORDER — NITROGLYCERIN 2 % TD OINT
TOPICAL_OINTMENT | TRANSDERMAL | Status: AC
  Administered 2015-07-21: 1 [in_us] via TOPICAL
  Filled 2015-07-21: qty 1

## 2015-07-21 MED ORDER — NITROGLYCERIN 2 % TD OINT
1.0000 [in_us] | TOPICAL_OINTMENT | Freq: Once | TRANSDERMAL | Status: AC
  Administered 2015-07-21: 1 [in_us] via TOPICAL

## 2015-07-21 NOTE — ED Notes (Addendum)
Patient brought in by ems from home. Patient with complaint of midline chest pain. Denies shortness of breath, nausea or vomiting. Patient with history of mi. Patient took 5 baby asa and isosorbide at ime.

## 2015-07-21 NOTE — ED Provider Notes (Addendum)
[facility] Emergency Department Provider Note  ____________________________________________   I have reviewed the triage vital signs and the nursing notes.   HISTORY  Chief Complaint Chest Pain    HPI Jimmy Carson is a 75 y.o. male with her, located medical history including multiple cardiac surgeries including a T aVR as well as bypass surgery. Patient states that he had substernal nonradiating chest pain with no associated shortness of breath or diaphoresis which started approximate 1 hour prior to coming in. The patient states the pain is nearly gone at this time. It does feel similar to his anginal little blood. He rates it at a 1 out of 10 at this time. He states it is much better. He did have aspirin. Nothing made it better nothing made it worse that he can think of. It was a pain/pressure sensation. Patient states that he has had no leg swelling or recent injury. He is a Monday Wednesday Friday dialysis patient. He did have dialysis today. He states he did take off the normal amount. The patient has had no chest pain like this since before his procedures at [facility].  Past Medical History  Diagnosis Date  . ESRD (end stage renal disease) ([place])   . Polycystic kidney disease   . Heart attack ([place])   . Prostate CA ([place])   . History of radiation therapy     for prostate CA  . Anemia   . Hyperlipidemia   . CAD (coronary artery disease)     s/p percutaneous coronary angioplasty, s/p CABG x 6   . Hypertension   . Osteoarthritis   . Shingles   . Hyperparathyroidism, secondary renal ([place])     There are no active problems to display for this patient.   Past Surgical History  Procedure Laterality Date  . Coronary artery bypass graft    . Aortic valve replacement    . Replacement total knee bilateral    . Joint replacement    . Rotator cuff repair Right     Current Outpatient Rx  Name  Route  Sig  Dispense  Refill  . acetaminophen (TYLENOL) 325  MG tablet   Oral   Take 650 mg by mouth every 6 (six) hours as needed.         . cholecalciferol (VITAMIN D) 400 UNITS TABS tablet   Oral   Take 1,000 Units by mouth.         . cinacalcet (SENSIPAR) 90 MG tablet   Oral   Take 90 mg by mouth daily.         . clopidogrel (PLAVIX) 75 MG tablet   Oral   Take 75 mg by mouth daily.         Marland Kitchen gabapentin (NEURONTIN) 100 MG capsule   Oral   Take 100 mg by mouth 3 (three) times daily.         . isosorbide mononitrate (IMDUR) 60 MG 24 hr tablet   Oral   Take 60 mg by mouth daily.         . rosuvastatin (CRESTOR) 10 MG tablet   Oral   Take 10 mg by mouth daily.         . sevelamer carbonate (RENVELA) 800 MG tablet   Oral   Take 800 mg by mouth 3 (three) times daily with meals.         . tamsulosin (FLOMAX) 0.4 MG CAPS capsule   Oral   Take 0.4 mg by mouth.         Marland Kitchen  traZODone (DESYREL) 50 MG tablet   Oral   Take 50 mg by mouth at bedtime.           Allergies Orange fruit and Penicillins  No family history on file.  Social History Social History  Substance Use Topics  . Smoking status: Former Smoker    Quit date: 04/19/2013  . Smokeless tobacco: None  . Alcohol Use: No    Review of Systems Constitutional: No fever/chills Eyes: No visual changes. ENT: No sore throat. No stiff neck no neck pain Cardiovascular: Positive chest pain. Respiratory: Denies shortness of breath. Gastrointestinal:   no vomiting.  No diarrhea.  No constipation. Genitourinary: Negative for dysuria. Musculoskeletal: Negative lower extremity swelling Skin: Negative for rash. Neurological: Negative for headaches, focal weakness or numbness. 10-point ROS otherwise negative.  ____________________________________________   PHYSICAL EXAM:  VITAL SIGNS: ED Triage Vitals  Enc Vitals Group     BP 07/21/15 2157 162/72 mmHg     Pulse Rate 07/21/15 2157 83     Resp 07/21/15 2157 17     Temp 07/21/15 2157 98.2 F (36.8 C)      Temp Source 07/21/15 2157 Oral     SpO2 07/21/15 2157 98 %     Weight 07/21/15 2157 180 lb (81.647 kg)     Height 07/21/15 2157 5\' 10"  (1.778 m)     Head Cir --      Peak Flow --      Pain Score 07/21/15 2200 2     Pain Loc --      Pain Edu? --      Excl. in [place]? --     Constitutional: Alert and oriented. Well appearing and in no acute distress. Eyes: Conjunctivae are normal. PERRL. EOMI. Head: Atraumatic. Nose: No congestion/rhinnorhea. Mouth/Throat: Mucous membranes are moist.  Oropharynx non-erythematous. Neck: No stridor.   Nontender with no meningismus Cardiovascular: Normal rate, regular rhythm. Grossly normal heart sounds.  Good peripheral circulation. Respiratory: Normal respiratory effort.  No retractions. Lungs CTAB. Abdominal: Soft and nontender. No distention. No guarding no rebound Back:  There is no focal tenderness or step off there is no midline tenderness there are no lesions noted. there is no CVA tenderness Musculoskeletal: No lower extremity tenderness. No joint effusions, no DVT signs strong distal pulses no edema, fistula with good thrill noted on the left upper extremity Neurologic:  Normal speech and language. No gross focal neurologic deficits are appreciated.  Skin:  Skin is warm, dry and intact. No rash noted. Psychiatric: Mood and affect are normal. Speech and behavior are normal.  ____________________________________________   LABS (all labs ordered are listed, but only abnormal results are displayed)  Labs Reviewed  BASIC METABOLIC PANEL  CBC  TROPONIN I  PROTIME-INR  APTT   ____________________________________________  EKG  I personally interpreted any EKGs ordered by me or triage First EKG: Sinus rhythm rate 82 bpm, there is a repolarization configuration to the T3 and 4 with a possible strain pattern in V5 and V6 bundle branch block configuration noted. Second EKG shows sinus rate 83, the same repolarization again noted, no change.  Normal axis. Bundle branch block noted ____________________________________________  RADIOLOGY  I reviewed any imaging ordered by me or triage that were performed during my shift ____________________________________________   PROCEDURES  Procedure(s) performed: None  Critical Care performed: None  ____________________________________________   INITIAL IMPRESSION / ASSESSMENT AND PLAN / ED COURSE  Pertinent labs & imaging results that were available during my care of the  patient were reviewed by me and considered in my medical decision making (see chart for details).  Patient with a very concerning history and a concerning EKG. I was, however, (see below) able to talk to [facility] cardiology. They agree with our management thus far. Patient has been given nitroglycerin. His pain is nearly gone. According to [facility] cardiology, this EKG is not different from what they have seen but they did ask [place] to fax it to see whether they wish me to call the patient in acute ST elevation myocardial infarction. This has been sent. In the meantime we'll continue to monitor the patient closely here. We are waiting a rapid call back.  ----------------------------------------- 10:13 PM on 07/21/2015 -----------------------------------------  Talk just now to Dr. Einar Pheasant, on call for [facility] cardiology. They described EKGs for this patient that looks similar to what we have here. Patient's pain is nearly gone. We will defer to his cardiology is whether they wished me to call him a code STEMI and send him.  ----------------------------------------- 10:28 PM on 07/21/2015 -----------------------------------------  The cardiology called back and state that the patient does not need to go as a code STEMI. His bundle-branch block and ST elevation are the same as he has had on other EKGs from them after the procedure, they do not feel the patient therefore needs to go as a ST elevation MI. Patient himself states that he  is feeling quite well this time he rates his pain at a 0-0.5 out of 10.  Blood pressure 138/67, pulse 78, temperature 98.2 F (36.8 C), temperature source Oral, resp. rate 16, height 5\' 10"  (1.778 m), weight 180 lb (81.647 kg), SpO2 95 %. ----------------------------------------- 11:00 PM on 07/21/2015 -----------------------------------------  Patient remains pain-free at did talk to Dr. Serafina Royals, his on-call cardiologist. He agrees with disposition to this facility. We will admit to the hospitalist. ____________________________________________   FINAL CLINICAL IMPRESSION(S) / ED DIAGNOSES  Final diagnoses:  Chest pain     Schuyler Amor, MD 07/21/15 2216  Schuyler Amor, MD 07/21/15 [place], MD 07/21/15 [phone]

## 2015-07-21 NOTE — ED Notes (Signed)
MD at bedside. 

## 2015-07-22 ENCOUNTER — Encounter: Payer: Self-pay | Admitting: Emergency Medicine

## 2015-07-22 DIAGNOSIS — M1990 Unspecified osteoarthritis, unspecified site: Secondary | ICD-10-CM | POA: Diagnosis present

## 2015-07-22 DIAGNOSIS — E785 Hyperlipidemia, unspecified: Secondary | ICD-10-CM | POA: Diagnosis present

## 2015-07-22 DIAGNOSIS — N400 Enlarged prostate without lower urinary tract symptoms: Secondary | ICD-10-CM | POA: Diagnosis present

## 2015-07-22 DIAGNOSIS — Z87891 Personal history of nicotine dependence: Secondary | ICD-10-CM | POA: Diagnosis not present

## 2015-07-22 DIAGNOSIS — Q613 Polycystic kidney, unspecified: Secondary | ICD-10-CM | POA: Diagnosis not present

## 2015-07-22 DIAGNOSIS — Z96653 Presence of artificial knee joint, bilateral: Secondary | ICD-10-CM | POA: Diagnosis present

## 2015-07-22 DIAGNOSIS — I25110 Atherosclerotic heart disease of native coronary artery with unstable angina pectoris: Secondary | ICD-10-CM | POA: Diagnosis present

## 2015-07-22 DIAGNOSIS — I252 Old myocardial infarction: Secondary | ICD-10-CM | POA: Diagnosis not present

## 2015-07-22 DIAGNOSIS — Z91018 Allergy to other foods: Secondary | ICD-10-CM | POA: Diagnosis not present

## 2015-07-22 DIAGNOSIS — Z8546 Personal history of malignant neoplasm of prostate: Secondary | ICD-10-CM | POA: Diagnosis not present

## 2015-07-22 DIAGNOSIS — N186 End stage renal disease: Secondary | ICD-10-CM | POA: Diagnosis present

## 2015-07-22 DIAGNOSIS — Z952 Presence of prosthetic heart valve: Secondary | ICD-10-CM | POA: Diagnosis not present

## 2015-07-22 DIAGNOSIS — I214 Non-ST elevation (NSTEMI) myocardial infarction: Secondary | ICD-10-CM | POA: Diagnosis present

## 2015-07-22 DIAGNOSIS — Z951 Presence of aortocoronary bypass graft: Secondary | ICD-10-CM | POA: Diagnosis not present

## 2015-07-22 DIAGNOSIS — Z79899 Other long term (current) drug therapy: Secondary | ICD-10-CM | POA: Diagnosis not present

## 2015-07-22 DIAGNOSIS — Z9861 Coronary angioplasty status: Secondary | ICD-10-CM | POA: Diagnosis not present

## 2015-07-22 DIAGNOSIS — E039 Hypothyroidism, unspecified: Secondary | ICD-10-CM | POA: Diagnosis present

## 2015-07-22 DIAGNOSIS — R079 Chest pain, unspecified: Secondary | ICD-10-CM | POA: Diagnosis present

## 2015-07-22 DIAGNOSIS — Z880 Allergy status to penicillin: Secondary | ICD-10-CM | POA: Diagnosis not present

## 2015-07-22 DIAGNOSIS — Z923 Personal history of irradiation: Secondary | ICD-10-CM | POA: Diagnosis not present

## 2015-07-22 DIAGNOSIS — Z7982 Long term (current) use of aspirin: Secondary | ICD-10-CM | POA: Diagnosis not present

## 2015-07-22 DIAGNOSIS — Z7902 Long term (current) use of antithrombotics/antiplatelets: Secondary | ICD-10-CM | POA: Diagnosis not present

## 2015-07-22 DIAGNOSIS — N2581 Secondary hyperparathyroidism of renal origin: Secondary | ICD-10-CM | POA: Diagnosis present

## 2015-07-22 DIAGNOSIS — E782 Mixed hyperlipidemia: Secondary | ICD-10-CM | POA: Diagnosis present

## 2015-07-22 DIAGNOSIS — D631 Anemia in chronic kidney disease: Secondary | ICD-10-CM | POA: Diagnosis present

## 2015-07-22 DIAGNOSIS — I120 Hypertensive chronic kidney disease with stage 5 chronic kidney disease or end stage renal disease: Secondary | ICD-10-CM | POA: Diagnosis present

## 2015-07-22 DIAGNOSIS — I2510 Atherosclerotic heart disease of native coronary artery without angina pectoris: Secondary | ICD-10-CM | POA: Diagnosis present

## 2015-07-22 DIAGNOSIS — Z992 Dependence on renal dialysis: Secondary | ICD-10-CM | POA: Diagnosis not present

## 2015-07-22 LAB — TROPONIN I
Troponin I: 0.36 ng/mL — ABNORMAL HIGH (ref <0.031)
Troponin I: 1.65 ng/mL — ABNORMAL HIGH (ref <0.031)
Troponin I: 1.33 ng/mL — ABNORMAL HIGH (ref <0.031)

## 2015-07-22 LAB — CBC
HCT: 31.1 % — ABNORMAL LOW (ref 40.0–52.0)
Hemoglobin: 9.8 g/dL — ABNORMAL LOW (ref 13.0–18.0)
MCH: 27.9 pg (ref 26.0–34.0)
MCHC: 31.7 g/dL — AB (ref 32.0–36.0)
MCV: 88 fL (ref 80.0–100.0)
PLATELETS: 180 10*3/uL (ref 150–440)
RBC: 3.53 MIL/uL — AB (ref 4.40–5.90)
RDW: 20.1 % — AB (ref 11.5–14.5)
WBC: 6.2 10*3/uL (ref 3.8–10.6)

## 2015-07-22 LAB — HEPARIN LEVEL (UNFRACTIONATED)
HEPARIN UNFRACTIONATED: 0.12 [IU]/mL — AB (ref 0.30–0.70)
Heparin Unfractionated: 0.15 IU/mL — ABNORMAL LOW (ref 0.30–0.70)

## 2015-07-22 LAB — HEMOGLOBIN A1C: Hgb A1c MFr Bld: 5.1 % (ref 4.0–6.0)

## 2015-07-22 LAB — TSH: TSH: 3.343 u[IU]/mL (ref 0.350–4.500)

## 2015-07-22 MED ORDER — DOCUSATE SODIUM 100 MG PO CAPS
100.0000 mg | ORAL_CAPSULE | Freq: Two times a day (BID) | ORAL | Status: DC
  Administered 2015-07-24: 100 mg via ORAL
  Filled 2015-07-22 (×4): qty 1

## 2015-07-22 MED ORDER — ONDANSETRON HCL 4 MG PO TABS
4.0000 mg | ORAL_TABLET | Freq: Four times a day (QID) | ORAL | Status: DC | PRN
Start: 2015-07-22 — End: 2015-07-24

## 2015-07-22 MED ORDER — HEPARIN BOLUS VIA INFUSION
2300.0000 [IU] | Freq: Once | INTRAVENOUS | Status: AC
  Administered 2015-07-22: 2300 [IU] via INTRAVENOUS
  Filled 2015-07-22: qty 2300

## 2015-07-22 MED ORDER — SEVELAMER CARBONATE 800 MG PO TABS
800.0000 mg | ORAL_TABLET | Freq: Three times a day (TID) | ORAL | Status: DC
  Administered 2015-07-22: 800 mg via ORAL
  Filled 2015-07-22 (×2): qty 1

## 2015-07-22 MED ORDER — GABAPENTIN 100 MG PO CAPS
100.0000 mg | ORAL_CAPSULE | Freq: Three times a day (TID) | ORAL | Status: DC
  Administered 2015-07-22 – 2015-07-24 (×7): 100 mg via ORAL
  Filled 2015-07-22 (×7): qty 1

## 2015-07-22 MED ORDER — ACETAMINOPHEN 650 MG RE SUPP: 650.0000 mg | Freq: Four times a day (QID) | RECTAL | Status: DC | PRN

## 2015-07-22 MED ORDER — TRAZODONE HCL 50 MG PO TABS
50.0000 mg | ORAL_TABLET | Freq: Every day | ORAL | Status: DC
  Administered 2015-07-22 – 2015-07-23 (×3): 50 mg via ORAL
  Filled 2015-07-22 (×3): qty 1

## 2015-07-22 MED ORDER — CINACALCET HCL 30 MG PO TABS
90.0000 mg | ORAL_TABLET | Freq: Every day | ORAL | Status: DC
  Administered 2015-07-22 – 2015-07-24 (×3): 90 mg via ORAL
  Filled 2015-07-22 (×3): qty 3

## 2015-07-22 MED ORDER — CLOPIDOGREL BISULFATE 75 MG PO TABS
75.0000 mg | ORAL_TABLET | Freq: Every day | ORAL | Status: DC
  Administered 2015-07-22 – 2015-07-24 (×3): 75 mg via ORAL
  Filled 2015-07-22 (×3): qty 1

## 2015-07-22 MED ORDER — HEPARIN BOLUS VIA INFUSION
4000.0000 [IU] | Freq: Once | INTRAVENOUS | Status: AC
  Administered 2015-07-22: 4000 [IU] via INTRAVENOUS
  Filled 2015-07-22: qty 4000

## 2015-07-22 MED ORDER — ISOSORBIDE MONONITRATE ER 60 MG PO TB24
60.0000 mg | ORAL_TABLET | Freq: Every day | ORAL | Status: DC
  Administered 2015-07-22 – 2015-07-24 (×3): 60 mg via ORAL
  Filled 2015-07-22 (×3): qty 1

## 2015-07-22 MED ORDER — ACETAMINOPHEN 325 MG PO TABS: 650.0000 mg | ORAL_TABLET | Freq: Four times a day (QID) | ORAL | Status: DC | PRN

## 2015-07-22 MED ORDER — HEPARIN (PORCINE) IN NACL 100-0.45 UNIT/ML-% IJ SOLN
1800.0000 [IU]/h | INTRAMUSCULAR | Status: DC
Start: 2015-07-22 — End: 2015-07-24
  Administered 2015-07-22: 950 [IU]/h via INTRAVENOUS
  Administered 2015-07-22: 1200 [IU]/h via INTRAVENOUS
  Administered 2015-07-24: 1650 [IU]/h via INTRAVENOUS
  Filled 2015-07-22 (×7): qty 250

## 2015-07-22 MED ORDER — ASPIRIN EC 81 MG PO TBEC
81.0000 mg | DELAYED_RELEASE_TABLET | Freq: Every day | ORAL | Status: DC
  Administered 2015-07-22 – 2015-07-24 (×3): 81 mg via ORAL
  Filled 2015-07-22 (×3): qty 1

## 2015-07-22 MED ORDER — SEVELAMER CARBONATE 800 MG PO TABS
2400.0000 mg | ORAL_TABLET | Freq: Three times a day (TID) | ORAL | Status: DC
  Administered 2015-07-22 – 2015-07-24 (×6): 2400 mg via ORAL
  Filled 2015-07-22 (×6): qty 3

## 2015-07-22 MED ORDER — SODIUM CHLORIDE 0.9% FLUSH
3.0000 mL | Freq: Two times a day (BID) | INTRAVENOUS | Status: DC
  Administered 2015-07-22 – 2015-07-23 (×2): 3 mL via INTRAVENOUS

## 2015-07-22 MED ORDER — MORPHINE SULFATE (PF) 2 MG/ML IV SOLN: 2.0000 mg | INTRAVENOUS | Status: DC | PRN

## 2015-07-22 MED ORDER — HEPARIN SODIUM (PORCINE) 5000 UNIT/ML IJ SOLN: 5000.0000 [IU] | Freq: Three times a day (TID) | INTRAMUSCULAR | Status: DC

## 2015-07-22 MED ORDER — ONDANSETRON HCL 4 MG/2ML IJ SOLN: 4.0000 mg | Freq: Four times a day (QID) | INTRAMUSCULAR | Status: DC | PRN

## 2015-07-22 MED ORDER — ROSUVASTATIN CALCIUM 10 MG PO TABS
10.0000 mg | ORAL_TABLET | Freq: Every day | ORAL | Status: DC
  Administered 2015-07-22 – 2015-07-24 (×3): 10 mg via ORAL
  Filled 2015-07-22 (×3): qty 1

## 2015-07-22 MED ORDER — TAMSULOSIN HCL 0.4 MG PO CAPS
0.4000 mg | ORAL_CAPSULE | Freq: Every day | ORAL | Status: DC
  Administered 2015-07-22 – 2015-07-24 (×3): 0.4 mg via ORAL
  Filled 2015-07-22 (×3): qty 1

## 2015-07-22 MED ORDER — VITAMIN D 1000 UNITS PO TABS
1000.0000 [IU] | ORAL_TABLET | Freq: Every day | ORAL | Status: DC
  Administered 2015-07-22 – 2015-07-24 (×3): 1000 [IU] via ORAL
  Filled 2015-07-22 (×3): qty 1

## 2015-07-22 NOTE — H&P (Signed)
Jimmy Carson is an 75 y.o. male.   Chief Complaint: Chest pain HPI: The patient presents emergency department complaining of chest pain that began at rest. The patient was in bed when he felt as if "an elephant was standing on his chest." He denies associated nausea, vomiting, shortness of breath or diaphoresis. It did not radiate. The patient states he did not have nitroglycerin at home so he took some of his long-acting nitrate. However his chest pain did not improve until he received aspirin and nitroglycerin paste at the emergency department. Initial troponin was negative and there are no EKG changes, however due to his history of coronary artery disease emergency department staff called for admission.  Past Medical History  Diagnosis Date  . ESRD (end stage renal disease) ([place])   . Polycystic kidney disease   . Heart attack ([place])   . Prostate CA ([place])   . History of radiation therapy     for prostate CA  . Anemia   . Hyperlipidemia   . CAD (coronary artery disease)     s/p percutaneous coronary angioplasty, s/p CABG x 6   . Hypertension   . Osteoarthritis   . Shingles   . Hyperparathyroidism, secondary renal [facility])     Past Surgical History  Procedure Laterality Date  . Coronary artery bypass graft    . Aortic valve replacement    . Replacement total knee bilateral    . Joint replacement    . Rotator cuff repair Right     Family History  Problem Relation Age of Onset  . Polycystic kidney disease Father    Social History:  reports that he quit smoking about 2 years ago. He does not have any smokeless tobacco history on file. He reports that he does not drink alcohol. His drug history is not on file.  Allergies:  Allergies  Allergen Reactions  . Orange Fruit [Citrus] Hives  . Penicillins Hives    Has patient had a PCN reaction causing immediate rash, facial/tongue/throat swelling, SOB or lightheadedness with hypotension: Yes Has patient had a PCN reaction causing  severe rash involving mucus membranes or skin necrosis: No Has patient had a PCN reaction that required hospitalization No Has patient had a PCN reaction occurring within the last 10 years: No If all of the above answers are "NO", then may proceed with Cephalosporin use.     Medications Prior to Admission  Medication Sig Dispense Refill  . acetaminophen (TYLENOL) 325 MG tablet Take 650 mg by mouth every 6 (six) hours as needed.    . cholecalciferol (VITAMIN D) 400 UNITS TABS tablet Take 1,000 Units by mouth.    . cinacalcet (SENSIPAR) 90 MG tablet Take 90 mg by mouth daily.    . clopidogrel (PLAVIX) 75 MG tablet Take 75 mg by mouth daily.    Marland Kitchen gabapentin (NEURONTIN) 100 MG capsule Take 100 mg by mouth 3 (three) times daily.    . isosorbide mononitrate (IMDUR) 60 MG 24 hr tablet Take 60 mg by mouth daily.    . rosuvastatin (CRESTOR) 10 MG tablet Take 10 mg by mouth daily.    . sevelamer carbonate (RENVELA) 800 MG tablet Take 800 mg by mouth 3 (three) times daily with meals.    . tamsulosin (FLOMAX) 0.4 MG CAPS capsule Take 0.4 mg by mouth.    . traZODone (DESYREL) 50 MG tablet Take 50 mg by mouth at bedtime.      Results for orders placed or performed during the hospital  encounter of 07/21/15 (from the past 48 hour(s))  Basic metabolic panel     Status: Abnormal   Collection Time: 07/21/15 10:07 PM  Result Value Ref Range   Sodium 142 135 - 145 mmol/L   Potassium 3.9 3.5 - 5.1 mmol/L   Chloride 97 (L) 101 - 111 mmol/L   CO2 37 (H) 22 - 32 mmol/L   Glucose, Bld 180 (H) 65 - 99 mg/dL   BUN 13 6 - 20 mg/dL   Creatinine, Ser 3.69 (H) 0.61 - 1.24 mg/dL   Calcium 8.1 (L) 8.9 - 10.3 mg/dL   GFR calc non Af Amer 15 (L) >60 mL/min   GFR calc Af Amer 17 (L) >60 mL/min    Comment: (NOTE) The eGFR has been calculated using the CKD EPI equation. This calculation has not been validated in all clinical situations. eGFR's persistently <60 mL/min signify possible Chronic Kidney Disease.     Anion gap 8 5 - 15  CBC     Status: Abnormal   Collection Time: 07/21/15 10:07 PM  Result Value Ref Range   WBC 6.0 3.8 - 10.6 K/uL   RBC 3.79 (L) 4.40 - 5.90 MIL/uL   Hemoglobin 10.8 (L) 13.0 - 18.0 g/dL   HCT 33.6 (L) 40.0 - 52.0 %   MCV 88.7 80.0 - 100.0 fL   MCH 28.6 26.0 - 34.0 pg   MCHC 32.3 32.0 - 36.0 g/dL   RDW 20.2 (H) 11.5 - 14.5 %   Platelets 194 150 - 440 K/uL  Troponin I     Status: None   Collection Time: 07/21/15 10:07 PM  Result Value Ref Range   Troponin I <0.03 <0.031 ng/mL    Comment:        NO INDICATION OF MYOCARDIAL INJURY.   Protime-INR     Status: None   Collection Time: 07/21/15 10:07 PM  Result Value Ref Range   Prothrombin Time 14.3 11.4 - 15.0 seconds   INR 1.09   APTT     Status: Abnormal   Collection Time: 07/21/15 10:07 PM  Result Value Ref Range   aPTT 39 (H) 24 - 36 seconds    Comment:        IF BASELINE aPTT IS ELEVATED, SUGGEST PATIENT RISK ASSESSMENT BE USED TO DETERMINE APPROPRIATE ANTICOAGULANT THERAPY.    Dg Chest Port 1 View  07/21/2015  CLINICAL DATA:  Midline chest pain starting tonight. EXAM: PORTABLE CHEST 1 VIEW COMPARISON:  07/05/2014. FINDINGS: 08/13/2006 hrs. low volume film. The cardio pericardial silhouette is enlarged. There is pulmonary vascular congestion without overt pulmonary edema. No overt airspace pulmonary edema or focal lung consolidation. Patient is status post median sternotomy. Telemetry leads overlie the chest. Bones are diffusely demineralized. IMPRESSION: Low volume film with cardiomegaly and vascular congestion. Electronically Signed   By: Misty Stanley M.D.   On: 07/21/2015 22:30    Review of Systems  Constitutional: Negative for fever and chills.  HENT: Negative for sore throat and tinnitus.   Eyes: Negative for blurred vision and redness.  Respiratory: Negative for cough and shortness of breath.   Cardiovascular: Positive for chest pain. Negative for palpitations, orthopnea and PND.   Gastrointestinal: Negative for nausea, vomiting, abdominal pain and diarrhea.  Genitourinary: Negative for dysuria, urgency and frequency.  Musculoskeletal: Negative for myalgias and joint pain.  Skin: Negative for rash.       No lesions  Neurological: Negative for speech change, focal weakness and weakness.  Endo/Heme/Allergies: Does not bruise/bleed easily.  No temperature intolerance  Psychiatric/Behavioral: Negative for depression and suicidal ideas.    Blood pressure 139/69, pulse 72, temperature 98.2 F (36.8 C), temperature source Oral, resp. rate 18, height _0  (1.778 m), weight 78.427 kg (172 lb 14.4 oz), SpO2 97 %. Physical Exam  Nursing note and vitals reviewed. Constitutional: He is oriented to person, place, and time. He appears well-developed and well-nourished. No distress.  HENT:  Head: Normocephalic and atraumatic.  Mouth/Throat: Oropharynx is clear and moist.  Eyes: Conjunctivae and EOM are normal. Pupils are equal, round, and reactive to light. No scleral icterus.  Neck: Normal range of motion. Neck supple. No JVD present. No tracheal deviation present. No thyromegaly present.  Cardiovascular: Normal rate and regular rhythm.  Exam reveals no gallop and no friction rub.   Murmur heard.  Systolic murmur is present with a grade of 3/6  Murmur heard best over aortic valve area  Respiratory: Effort normal and breath sounds normal.  GI: Soft. Bowel sounds are normal. He exhibits no distension. There is no tenderness.  Genitourinary:  Deferred  Musculoskeletal: Normal range of motion. He exhibits no edema.  Lymphadenopathy:    He has no cervical adenopathy.  Neurological: He is alert and oriented to person, place, and time. No cranial nerve deficit.  Skin: Skin is warm and dry. No rash noted. He is not diaphoretic. No erythema.  Psychiatric: He has a normal mood and affect. His behavior is normal. Judgment and thought content normal.      Assessment/Plan This is a 75 year old Caucasian male with coronary artery disease admitted for chest pain. 1. Chest pain: Unstable angina. Continue to follow cardiac biomarkers. We will monitor telemetry. Cardiology consultation ordered. 2. Coronary artery disease: Continue aspirin and Plavix as well as indoor 3. ESRD: On dialysis Monday Wednesday Friday. Continue Sensipar and Renvela 4. BPH: Continue Flomax 5. DVT prophylaxis: Heparin 6. GI prophylaxis: None as the patient is not critically ill The patient is a full code. Time spent on admission was inpatient care approximately 45 minutes  Harrie Foreman 07/22/2015, 1:07 AM

## 2015-07-22 NOTE — Consult Note (Signed)
[facility] Consultation Note  Patient ID: Jimmy Carson, MRN: [id number], DOB/AGE: [id number] y.o. Admit date: 07/21/2015   Date of Consult: 07/22/2015 Primary Physician: Dion Body, MD Primary Cardiologist: Jarrett Soho  Chief Complaint:  Chief Complaint  Patient presents with  . Chest Pain   Reason for Consult: acute non-ST elevation myocardial infarction  HPI: 75 y.o. 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 The patient has had new onset of acute substernal heaviness like an elephant standing on his chest for several hour period with shortness of breath and weakness and in and around dialysis for which the patient had relief with nitroglycerin. The patient has full relief at this time and has no further significant symptoms and has an EKG showing normal sinus rhythm with left bundle-branch block. The patient additionally has an elevated troponin of 1.65 consistent with acute non-ST elevation myocardial infarction  Past Medical History  Diagnosis Date  . ESRD (end stage renal disease) ([place])   . Polycystic kidney disease   . Heart attack ([place])   . Prostate CA ([place])   . History of radiation therapy     for prostate CA  . Anemia   . Hyperlipidemia   . CAD (coronary artery disease)     s/p percutaneous coronary angioplasty, s/p CABG x 6   . Hypertension   . Osteoarthritis   . Shingles   . Hyperparathyroidism, secondary renal [facility])       Surgical History:  Past Surgical History  Procedure Laterality Date  . Coronary artery bypass graft    . Aortic valve replacement    . Replacement total knee bilateral    . Joint  replacement    . Rotator cuff repair Right      Home Meds: Prior to Admission medications   Medication Sig Start Date End Date Taking? Authorizing Provider  acetaminophen (TYLENOL) 325 MG tablet Take 650 mg by mouth every 6 (six) hours as needed.   Yes Historical Provider, MD  cholecalciferol (VITAMIN D) 400 UNITS TABS tablet Take 1,000 Units by mouth.   Yes Historical Provider, MD  cinacalcet (SENSIPAR) 90 MG tablet Take 90 mg by mouth daily.   Yes Historical Provider, MD  clopidogrel (PLAVIX) 75 MG tablet Take 75 mg by mouth daily.   Yes Historical Provider, MD  gabapentin (NEURONTIN) 100 MG capsule Take 100 mg by mouth 3 (three) times daily.   Yes Historical Provider, MD  isosorbide mononitrate (IMDUR) 60 MG 24 hr tablet Take 60 mg by mouth daily.   Yes Historical Provider, MD  rosuvastatin (CRESTOR) 10 MG tablet Take 10 mg by mouth daily.   Yes Historical Provider, MD  sevelamer carbonate (RENVELA) 800 MG tablet Take 800 mg by mouth 3 (three) times daily with meals.   Yes Historical Provider, MD  tamsulosin (FLOMAX) 0.4 MG CAPS capsule Take 0.4 mg by mouth.   Yes Historical Provider, MD  traZODone (DESYREL) 50 MG tablet Take 50 mg by mouth at bedtime.   Yes Historical Provider, MD    Inpatient Medications:  . aspirin EC  81 mg Oral Daily  . cholecalciferol  1,000 Units Oral Daily  . cinacalcet  90 mg Oral Daily  . clopidogrel  75 mg Oral  Daily  . docusate sodium  100 mg Oral BID  . gabapentin  100 mg Oral TID  . isosorbide mononitrate  60 mg Oral Daily  . rosuvastatin  10 mg Oral Daily  . sevelamer carbonate  800 mg Oral TID WC  . sodium chloride flush  3 mL Intravenous Q12H  . tamsulosin  0.4 mg Oral Daily  . traZODone  50 mg Oral QHS   . heparin 950 Units/hr (07/22/15 0238)    Allergies:  Allergies  Allergen Reactions  . Orange Fruit [Citrus] Hives  . Penicillins Hives    Has patient had a PCN reaction causing immediate rash, facial/tongue/throat swelling, SOB or  lightheadedness with hypotension: Yes Has patient had a PCN reaction causing severe rash involving mucus membranes or skin necrosis: No Has patient had a PCN reaction that required hospitalization No Has patient had a PCN reaction occurring within the last 10 years: No If all of the above answers are "NO", then may proceed with Cephalosporin use.     Social History   Social History  . Marital Status: Widowed    Spouse Name: N/A  . Number of Children: N/A  . Years of Education: N/A   Occupational History  . Not on file.   Social History Main Topics  . Smoking status: Former Smoker    Quit date: 04/19/2013  . Smokeless tobacco: Not on file  . Alcohol Use: No  . Drug Use: Not on file  . Sexual Activity: Not on file   Other Topics Concern  . Not on file   Social History Narrative     Family History  Problem Relation Age of Onset  . Polycystic kidney disease Father      Review of Systems Positive for chest pressure and heaviness Negative for: General:  chills, fever, night sweats or weight changes.  Cardiovascular: PND orthopnea syncope dizziness  Dermatological skin lesions rashes Respiratory: Cough congestion Urologic: Frequent urination urination at night and hematuria Abdominal: negative for nausea, vomiting, diarrhea, bright red blood per rectum, melena, or hematemesis Neurologic: negative for visual changes, and/or hearing changes  All other systems reviewed and are otherwise negative except as noted above.  Labs:  Recent Labs  07/21/15 2207 07/22/15 0107 07/22/15 0606  TROPONINI <0.03 0.36* 1.65*   Lab Results  Component Value Date   WBC 6.0 07/21/2015   HGB 10.8* 07/21/2015   HCT 33.6* 07/21/2015   MCV 88.7 07/21/2015   PLT 194 07/21/2015    Recent Labs Lab 07/21/15 2207  NA 142  K 3.9  CL 97*  CO2 37*  BUN 13  CREATININE 3.69*  CALCIUM 8.1*  GLUCOSE 180*   No results found for: CHOL, HDL, LDLCALC, TRIG No results found for:  DDIMER  Radiology/Studies:  Dg Chest Port 1 View  07/21/2015  CLINICAL DATA:  Midline chest pain starting tonight. EXAM: PORTABLE CHEST 1 VIEW COMPARISON:  07/05/2014. FINDINGS: 08/13/2006 hrs. low volume film. The cardio pericardial silhouette is enlarged. There is pulmonary vascular congestion without overt pulmonary edema. No overt airspace pulmonary edema or focal lung consolidation. Patient is status post median sternotomy. Telemetry leads overlie the chest. Bones are diffusely demineralized. IMPRESSION: Low volume film with cardiomegaly and vascular congestion. Electronically Signed   By: Misty Stanley M.D.   On: 07/21/2015 22:30    EKG: Normal sinus rhythm with left bundle-branch block  Weights: Filed Weights   07/21/15 2157 07/22/15 0024  Weight: 180 lb (81.647 kg) 172 lb 14.4 oz (78.427 kg)  Physical Exam: Blood pressure 121/50, pulse 66, temperature 97.8 F (36.6 C), temperature source Oral, resp. rate 18, height 5\' 10"  (1.778 m), weight 172 lb 14.4 oz (78.427 kg), SpO2 95 %. Body mass index is 24.81 kg/(m^2). General: Well developed, well nourished, in no acute distress. Head eyes ears nose throat: Normocephalic, atraumatic, sclera non-icteric, no xanthomas, nares are without discharge. No apparent thyromegaly and/or mass  Lungs: Normal respiratory effort.  no wheezes, few rales, no rhonchi.  Heart: RRR with normal S1 soft S2. 3+ aortic murmur gallop, no rub, PMI is normal size and placement, carotid upstroke normal without bruit, jugular venous pressure is normal Abdomen: Soft, non-tender, non-distended with normoactive bowel sounds. No hepatomegaly. No rebound/guarding. No obvious abdominal masses. Abdominal aorta is normal size without bruit Extremities: Trace edema. no cyanosis, no clubbing, no ulcers  Peripheral : 2+ bilateral upper extremity pulses with shunts on the left arm and right arm, 2+ bilateral femoral pulses, 2+ bilateral dorsal pedal pulse Neuro: Alert and  oriented. No facial asymmetry. No focal deficit. Moves all extremities spontaneously. Musculoskeletal: Normal muscle tone without kyphosis Psych:  Responds to questions appropriately with a normal affect.    Assessment: 75 year old male with known coronary artery disease status post coronary artery bypass graft chronic kidney disease stage V with essential hypertension mixed hyperlipidemia and acute non-ST elevation myocardial infarction  Plan: 1. Heparin for further risk reduction of myocardial infarction and possible graft closure 2. Continue aspirin for further risk reduction in cardiovascular event 3. Echocardiogram for LV systolic dysfunction valvular heart disease changes 4. High intensity cholesterol therapy with Crestor 5. Further consideration of cardiac catheterization to assess coronary anatomy and further treatment thereof is necessary. Patient understands the risk and benefits of cardiac catheterization. This includes a possibility of death stroke heart attack infection bleeding or blood clot. He is at low risk for conscious sedation  Signed, Corey Skains M.D. [facility] 07/22/2015, 8:53 AM

## 2015-07-22 NOTE — Progress Notes (Signed)
ANTICOAGULATION CONSULT NOTE - Initial Consult  Pharmacy Consult for heparin drip Indication: ACS/STEMI  Allergies  Allergen Reactions  . Orange Fruit [Citrus] Hives  . Penicillins Hives    Has patient had a PCN reaction causing immediate rash, facial/tongue/throat swelling, SOB or lightheadedness with hypotension: Yes Has patient had a PCN reaction causing severe rash involving mucus membranes or skin necrosis: No Has patient had a PCN reaction that required hospitalization No Has patient had a PCN reaction occurring within the last 10 years: No If all of the above answers are "NO", then may proceed with Cephalosporin use.     Patient Measurements: Height: 5\' 10"  (177.8 cm) Weight: 172 lb 14.4 oz (78.427 kg) IBW/kg (Calculated) : 73 Heparin Dosing Weight: 78kg  Vital Signs: Temp: 97.9 F (36.6 C) (01/28 1149) Temp Source: Oral (01/28 1149) BP: 121/61 mmHg (01/28 1149) Pulse Rate: 72 (01/28 1149)  Labs:  Recent Labs  07/21/15 2207 07/22/15 0107 07/22/15 0606 07/22/15 1143  HGB 10.8*  --   --  9.8*  HCT 33.6*  --   --  31.1*  PLT 194  --   --  180  APTT 39*  --   --   --   LABPROT 14.3  --   --   --   INR 1.09  --   --   --   HEPARINUNFRC  --   --   --  0.15*  CREATININE 3.69*  --   --   --   TROPONINI <0.03 0.36* 1.65*  --     Estimated Creatinine Clearance: 18.4 mL/min (by C-G formula based on Cr of 3.69).   Medical History: Past Medical History  Diagnosis Date  . ESRD (end stage renal disease) ([place])   . Polycystic kidney disease   . Heart attack ([place])   . Prostate CA ([place])   . History of radiation therapy     for prostate CA  . Anemia   . Hyperlipidemia   . CAD (coronary artery disease)     s/p percutaneous coronary angioplasty, s/p CABG x 6   . Hypertension   . Osteoarthritis   . Shingles   . Hyperparathyroidism, secondary renal ([facility])     Medications:    Assessment: Hgb 10.8  plt 194 INR 1.09  aPTT  39  Goal of Therapy:  Heparin level  0.3-0.7 units/ml Monitor platelets by anticoagulation protocol: Yes   Plan:  Heparin level is below goal so will bolus heparin 2300 units and increase infusion to 1200 units/hr. Will recheck HL in 8 hours.    Ulice Dash D 07/22/2015,12:38 PM

## 2015-07-22 NOTE — Progress Notes (Signed)
[place] [facility] [place] NAME: Avi Trotti    MR#:  [id number]  DATE OF BIRTH:  March 24, 1941  SUBJECTIVE:  CHIEF COMPLAINT:   Chief Complaint  Patient presents with  . Chest Pain   No further chest pain Significantly elevated troponin. Started on heparin drip  REVIEW OF SYSTEMS:    Review of Systems  Constitutional: Negative for fever and chills.  HENT: Negative for sore throat.   Eyes: Negative for blurred vision, double vision and pain.  Respiratory: Negative for cough, hemoptysis, shortness of breath and wheezing.   Cardiovascular: Negative for chest pain, palpitations, orthopnea and leg swelling.  Gastrointestinal: Negative for heartburn, nausea, vomiting, abdominal pain, diarrhea and constipation.  Genitourinary: Negative for dysuria and hematuria.  Musculoskeletal: Negative for back pain and joint pain.  Skin: Negative for rash.  Neurological: Negative for sensory change, speech change, focal weakness and headaches.  Endo/Heme/Allergies: Does not bruise/bleed easily.  Psychiatric/Behavioral: Negative for depression. The patient is not nervous/anxious.       DRUG ALLERGIES:   Allergies  Allergen Reactions  . Orange Fruit [Citrus] Hives  . Penicillins Hives    Has patient had a PCN reaction causing immediate rash, facial/tongue/throat swelling, SOB or lightheadedness with hypotension: Yes Has patient had a PCN reaction causing severe rash involving mucus membranes or skin necrosis: No Has patient had a PCN reaction that required hospitalization No Has patient had a PCN reaction occurring within the last 10 years: No If all of the above answers are "NO", then may proceed with Cephalosporin use.     VITALS:  Blood pressure 121/50, pulse 66, temperature 97.8 F (36.6 C), temperature source Oral, resp. rate 18, height 5\' 10"  (1.778 m), weight 78.427 kg (172 lb 14.4 oz), SpO2 95 %.  PHYSICAL EXAMINATION:    Physical Exam  GENERAL:  75 y.o.-year-old patient lying in the bed with no acute distress.  EYES: Pupils equal, round, reactive to light and accommodation. No scleral icterus. Extraocular muscles intact.  HEENT: Head atraumatic, normocephalic. Oropharynx and nasopharynx clear.  NECK:  Supple, no jugular venous distention. No thyroid enlargement, no tenderness.  LUNGS: Normal breath sounds bilaterally, no wheezing, rales, rhonchi. No use of accessory muscles of respiration.  CARDIOVASCULAR: S1, S2 normal. No murmurs, rubs, or gallops.  ABDOMEN: Soft, nontender, nondistended. Bowel sounds present. No organomegaly or mass.  EXTREMITIES: No cyanosis, clubbing or edema b/l.    NEUROLOGIC: Cranial nerves II through XII are intact. No focal Motor or sensory deficits b/l.   PSYCHIATRIC: The patient is alert and oriented x 3.  SKIN: No obvious rash, lesion, or ulcer.   LUE AVG   LABORATORY PANEL:   CBC  Recent Labs Lab 07/21/15 2207  WBC 6.0  HGB 10.8*  HCT 33.6*  PLT 194   ------------------------------------------------------------------------------------------------------------------  Chemistries   Recent Labs Lab 07/21/15 2207  NA 142  K 3.9  CL 97*  CO2 37*  GLUCOSE 180*  BUN 13  CREATININE 3.69*  CALCIUM 8.1*   ------------------------------------------------------------------------------------------------------------------  Cardiac Enzymes  Recent Labs Lab 07/22/15 0606  TROPONINI 1.65*   ------------------------------------------------------------------------------------------------------------------  RADIOLOGY:  Dg Chest Port 1 View  07/21/2015  CLINICAL DATA:  Midline chest pain starting tonight. EXAM: PORTABLE CHEST 1 VIEW COMPARISON:  07/05/2014. FINDINGS: 08/13/2006 hrs. low volume film. The cardio pericardial silhouette is enlarged. There is pulmonary vascular congestion without overt pulmonary edema. No overt airspace pulmonary edema or focal lung  consolidation. Patient is status post median sternotomy.  Telemetry leads overlie the chest. Bones are diffusely demineralized. IMPRESSION: Low volume film with cardiomegaly and vascular congestion. Electronically Signed   By: Misty Stanley M.D.   On: 07/21/2015 22:30     ASSESSMENT AND PLAN:   This is a 75 year old Caucasian male with coronary artery disease admitted for chest pain.  * NSTEMI ASA, Statin, BB, heparin drip Echo Cardiology help appreciated Cath on Monday  * HTN ON meds  * ESRD: On dialysis Monday Wednesday Friday. Continue Sensipar and Renvela. Consulted Nephrology Due tagain on Monday  * BPH: Continue Flomax  * DVT prophylaxis ON heparin drip  All the records are reviewed and case discussed with Care Management/Social Workerr. Management plans discussed with the patient, family and they are in agreement.  CODE STATUS: FULL  DVT Prophylaxis: SCDs  TOTAL TIME TAKING CARE OF THIS PATIENT: 45 minutes.   POSSIBLE D/C IN 2-3 DAYS, DEPENDING ON CLINICAL CONDITION.   Hillary Bow R M.D on 07/22/2015 at 10:38 AM  Between 7am to 6pm - Pager - [phone]  After 6pm go to [email or web address] - password [email or web address]  Office  [phone]  CC: Primary care physician; Dion Body, MD    Note: This dictation was prepared with Dragon dictation along with smaller phrase technology. Any transcriptional errors that result from this process are unintentional.

## 2015-07-22 NOTE — Progress Notes (Signed)
ANTICOAGULATION CONSULT NOTE - Initial Consult  Pharmacy Consult for heparin drip Indication: ACS/STEMI  Allergies  Allergen Reactions  . Orange Fruit [Citrus] Hives  . Penicillins Hives    Has patient had a PCN reaction causing immediate rash, facial/tongue/throat swelling, SOB or lightheadedness with hypotension: Yes Has patient had a PCN reaction causing severe rash involving mucus membranes or skin necrosis: No Has patient had a PCN reaction that required hospitalization No Has patient had a PCN reaction occurring within the last 10 years: No If all of the above answers are "NO", then may proceed with Cephalosporin use.     Patient Measurements: Height: 5\' 10"  (177.8 cm) Weight: 172 lb 14.4 oz (78.427 kg) IBW/kg (Calculated) : 73 Heparin Dosing Weight: 78kg  Vital Signs: Temp: 98.2 F (36.8 C) (01/28 0024) Temp Source: Oral (01/28 0024) BP: 139/69 mmHg (01/28 0024) Pulse Rate: 72 (01/28 0024)  Labs:  Recent Labs  07/21/15 2207 07/22/15 0107  HGB 10.8*  --   HCT 33.6*  --   PLT 194  --   APTT 39*  --   LABPROT 14.3  --   INR 1.09  --   CREATININE 3.69*  --   TROPONINI <0.03 0.36*    Estimated Creatinine Clearance: 18.4 mL/min (by C-G formula based on Cr of 3.69).   Medical History: Past Medical History  Diagnosis Date  . ESRD (end stage renal disease) ([place])   . Polycystic kidney disease   . Heart attack ([place])   . Prostate CA ([place])   . History of radiation therapy     for prostate CA  . Anemia   . Hyperlipidemia   . CAD (coronary artery disease)     s/p percutaneous coronary angioplasty, s/p CABG x 6   . Hypertension   . Osteoarthritis   . Shingles   . Hyperparathyroidism, secondary renal ([facility])     Medications:    Assessment: Hgb 10.8  plt 194 INR 1.09  aPTT  39  Goal of Therapy:  Heparin level 0.3-0.7 units/ml Monitor platelets by anticoagulation protocol: Yes   Plan:  4000 unit bolus and initial rate of 950 units/hr. First heparin  level 8 hours after start of infusion.  Vidit Boissonneault S 07/22/2015,2:05 AM

## 2015-07-22 NOTE — Progress Notes (Signed)
ANTICOAGULATION CONSULT NOTE - Initial Consult  Pharmacy Consult for heparin drip Indication: ACS/STEMI  Allergies  Allergen Reactions  . Orange Fruit [Citrus] Hives  . Penicillins Hives    Has patient had a PCN reaction causing immediate rash, facial/tongue/throat swelling, SOB or lightheadedness with hypotension: Yes Has patient had a PCN reaction causing severe rash involving mucus membranes or skin necrosis: No Has patient had a PCN reaction that required hospitalization No Has patient had a PCN reaction occurring within the last 10 years: No If all of the above answers are "NO", then may proceed with Cephalosporin use.     Patient Measurements: Height: 5\' 10"  (177.8 cm) Weight: 172 lb 14.4 oz (78.427 kg) IBW/kg (Calculated) : 73 Heparin Dosing Weight: 78kg  Vital Signs: Temp: 98.5 F (36.9 C) (01/28 2027) Temp Source: Oral (01/28 2027) BP: 136/70 mmHg (01/28 2027) Pulse Rate: 68 (01/28 2027)  Labs:  Recent Labs  07/21/15 2207 07/22/15 0107 07/22/15 0606 07/22/15 1143 07/22/15 2101  HGB 10.8*  --   --  9.8*  --   HCT 33.6*  --   --  31.1*  --   PLT 194  --   --  180  --   APTT 39*  --   --   --   --   LABPROT 14.3  --   --   --   --   INR 1.09  --   --   --   --   HEPARINUNFRC  --   --   --  0.15* 0.12*  CREATININE 3.69*  --   --   --   --   TROPONINI <0.03 0.36* 1.65* 1.33*  --     Estimated Creatinine Clearance: 18.4 mL/min (by C-G formula based on Cr of 3.69).   Medical History: Past Medical History  Diagnosis Date  . ESRD (end stage renal disease) ([place])   . Polycystic kidney disease   . Heart attack ([place])   . Prostate CA ([place])   . History of radiation therapy     for prostate CA  . Anemia   . Hyperlipidemia   . CAD (coronary artery disease)     s/p percutaneous coronary angioplasty, s/p CABG x 6   . Hypertension   . Osteoarthritis   . Shingles   . Hyperparathyroidism, secondary renal ([facility])     Medications:    Assessment: Hgb 10.8   plt 194 INR 1.09  aPTT  39  Goal of Therapy:  Heparin level 0.3-0.7 units/ml Monitor platelets by anticoagulation protocol: Yes   Plan:  Heparin level is below goal so will bolus heparin 2300 units and increase infusion to 1500 units/hr. Will recheck HL in 8 hours.    Kevonta Phariss D Sherilyn Windhorst 07/22/2015,9:45 PM

## 2015-07-22 NOTE — Progress Notes (Signed)
[facility]  ROUNDING NOTE   Subjective:   Admitted overnight for chest pain. Hemodialysis treatment yesterday. Tolerated treatment well.   Objective:  Vital signs in last 24 hours:  Temp:  [97.8 F (36.6 C)-98.2 F (36.8 C)] 97.8 F (36.6 C) (01/28 0411) Pulse Rate:  [66-83] 66 (01/28 0411) Resp:  [16-18] 18 (01/28 0411) BP: (121-162)/(50-72) 121/50 mmHg (01/28 0411) SpO2:  [95 %-98 %] 95 % (01/28 0411) Weight:  [78.427 kg (172 lb 14.4 oz)-81.647 kg (180 lb)] 78.427 kg (172 lb 14.4 oz) (01/28 0024)  Weight change:  Filed Weights   07/21/15 2157 07/22/15 0024  Weight: 81.647 kg (180 lb) 78.427 kg (172 lb 14.4 oz)    Intake/Output: I/O last 3 completed shifts: In: -  Out: 150 [Urine:150]   Intake/Output this shift:  Total I/O In: -  Out: 125 [Urine:125]  Physical Exam: General: NAD, sitting in chair  Head: Normocephalic, atraumatic. Moist oral mucosal membranes  Eyes: Anicteric, PERRL  Neck: Supple, trachea midline  Lungs:  Clear to auscultation  Heart: Regular rate and rhythm  Abdomen:  Soft, nontender,   Extremities: no peripheral edema.  Neurologic: Nonfocal, moving all four extremities  Skin: No lesions  Access: Right arm AVF, right forearm thrombosed AVF    Basic Metabolic Panel:  Recent Labs Lab 07/21/15 2207  NA 142  K 3.9  CL 97*  CO2 37*  GLUCOSE 180*  BUN 13  CREATININE 3.69*  CALCIUM 8.1*    Liver Function Tests: No results for input(s): AST, ALT, ALKPHOS, BILITOT, PROT, ALBUMIN in the last 168 hours. No results for input(s): LIPASE, AMYLASE in the last 168 hours. No results for input(s): AMMONIA in the last 168 hours.  CBC:  Recent Labs Lab 07/21/15 2207  WBC 6.0  HGB 10.8*  HCT 33.6*  MCV 88.7  PLT 194    Cardiac Enzymes:  Recent Labs Lab 07/21/15 2207 07/22/15 0107 07/22/15 0606  TROPONINI <0.03 0.36* 1.65*    BNP: Invalid input(s): POCBNP  CBG: No results for input(s): GLUCAP in the last 168  hours.  Microbiology: Results for orders placed or performed in visit on 03/29/13  Body fluid culture     Status: None   Collection Time: 03/29/13  4:05 PM  Result Value Ref Range Status   Micro Text Report   Final       SOURCE: R KNEE    ORGANISM 1                RARE COAGULASE NEGATIVE STAPHYLOCOCCUS   COMMENT                   CALL LAB IF SENSITIVITY TESTING REQUIRED   COMMENT                   NO ANAEROBES ISOLATED IN 4 DAYS   GRAM STAIN                NO ORGANISMS SEEN   GRAM STAIN                RARE WHITE BLOOD CELLS   ANTIBIOTIC                                                      Wound culture     Status: None   Collection  Time: 03/29/13  4:12 PM  Result Value Ref Range Status   Micro Text Report   Final       COMMENT                   NO GROWTH AEROBICALLY/ANAEROBICALLY IN 4 DAYS   GRAM STAIN                NO ORGANISMS SEEN   GRAM STAIN                RARE WHITE BLOOD CELLS   ANTIBIOTIC                                                      Wound culture     Status: None   Collection Time: 03/29/13  4:37 PM  Result Value Ref Range Status   Micro Text Report   Final       SOURCE: right knee notch    COMMENT                   NO GROWTH AEROBICALLY/ANAEROBICALLY IN 4 DAYS   GRAM STAIN                NO ORGANISMS SEEN   GRAM STAIN                MODERATE WHITE BLOOD CELLS   ANTIBIOTIC                                                        Coagulation Studies:  Recent Labs  07/21/15 2207  LABPROT 14.3  INR 1.09    Urinalysis: No results for input(s): COLORURINE, LABSPEC, PHURINE, GLUCOSEU, HGBUR, BILIRUBINUR, KETONESUR, PROTEINUR, UROBILINOGEN, NITRITE, LEUKOCYTESUR in the last 72 hours.  Invalid input(s): APPERANCEUR    Imaging: Dg Chest Port 1 View  07/21/2015  CLINICAL DATA:  Midline chest pain starting tonight. EXAM: PORTABLE CHEST 1 VIEW COMPARISON:  07/05/2014. FINDINGS: 08/13/2006 hrs. low volume film. The cardio pericardial silhouette is  enlarged. There is pulmonary vascular congestion without overt pulmonary edema. No overt airspace pulmonary edema or focal lung consolidation. Patient is status post median sternotomy. Telemetry leads overlie the chest. Bones are diffusely demineralized. IMPRESSION: Low volume film with cardiomegaly and vascular congestion. Electronically Signed   By: Misty Stanley M.D.   On: 07/21/2015 22:30     Medications:   . heparin 950 Units/hr (07/22/15 0238)   . aspirin EC  81 mg Oral Daily  . cholecalciferol  1,000 Units Oral Daily  . cinacalcet  90 mg Oral Daily  . clopidogrel  75 mg Oral Daily  . docusate sodium  100 mg Oral BID  . gabapentin  100 mg Oral TID  . isosorbide mononitrate  60 mg Oral Daily  . rosuvastatin  10 mg Oral Daily  . sevelamer carbonate  800 mg Oral TID WC  . sodium chloride flush  3 mL Intravenous Q12H  . tamsulosin  0.4 mg Oral Daily  . traZODone  50 mg Oral QHS   acetaminophen **OR** acetaminophen, morphine injection, ondansetron **OR** ondansetron (ZOFRAN) IV  Assessment/ Plan:  Jimmy Carson is a 75  y.o.white  male with hypothyroidism, hypertension, BPH, CAD status post CABG, hyperlipidemia.   [facility] [place] MWF  1. End Stage Renal Disease: outpatient dialysis treatment yesterday. Tolerated treatment well without complaints. Outpatient records reviewed. No acute indication for dialysis - Continue MWF schedule.  - Renally dose all medications  2. Hypertension: blood pressure well controlled.  - Imdur and tamsulosin  3. Secondary Hyperparathyroidism:  - increase sevelamer to 3 tabs with meals - continue cinacalcet.   4. Anemia of chronic kidney disease: hemoglobin 10.8 - with ischemia, will hold epo. Gets micera as outpatient.    LOS: 0 Jimmy Carson 1/28/20179:58 AM

## 2015-07-23 LAB — CBC
HEMATOCRIT: 31.9 % — AB (ref 40.0–52.0)
HEMOGLOBIN: 10.2 g/dL — AB (ref 13.0–18.0)
MCH: 28 pg (ref 26.0–34.0)
MCHC: 32 g/dL (ref 32.0–36.0)
MCV: 87.7 fL (ref 80.0–100.0)
PLATELETS: 172 10*3/uL (ref 150–440)
RBC: 3.63 MIL/uL — AB (ref 4.40–5.90)
RDW: 20.1 % — AB (ref 11.5–14.5)
WBC: 5.7 10*3/uL (ref 3.8–10.6)

## 2015-07-23 LAB — HEPARIN LEVEL (UNFRACTIONATED)
HEPARIN UNFRACTIONATED: 0.25 [IU]/mL — AB (ref 0.30–0.70)
HEPARIN UNFRACTIONATED: 0.35 [IU]/mL (ref 0.30–0.70)
Heparin Unfractionated: 0.32 IU/mL (ref 0.30–0.70)

## 2015-07-23 MED ORDER — SODIUM CHLORIDE 0.9 % WEIGHT BASED INFUSION: 1.0000 mL/kg/h | INTRAVENOUS | Status: DC

## 2015-07-23 MED ORDER — HEPARIN BOLUS VIA INFUSION: 1100.0000 [IU] | Freq: Once | INTRAVENOUS | Status: DC

## 2015-07-23 MED ORDER — SODIUM CHLORIDE 0.9 % WEIGHT BASED INFUSION: 3.0000 mL/kg/h | INTRAVENOUS | Status: DC

## 2015-07-23 MED ORDER — SODIUM CHLORIDE 0.9 % IV SOLN: 250.0000 mL | INTRAVENOUS | Status: DC | PRN

## 2015-07-23 MED ORDER — SODIUM CHLORIDE 0.9% FLUSH
3.0000 mL | INTRAVENOUS | Status: DC | PRN
Start: 2015-07-23 — End: 2015-07-24

## 2015-07-23 MED ORDER — HEPARIN BOLUS VIA INFUSION
1200.0000 [IU] | Freq: Once | INTRAVENOUS | Status: AC
  Administered 2015-07-23: 1200 [IU] via INTRAVENOUS
  Filled 2015-07-23: qty 1200

## 2015-07-23 MED ORDER — ASPIRIN 81 MG PO CHEW
81.0000 mg | CHEWABLE_TABLET | ORAL | Status: AC
  Administered 2015-07-24: 81 mg via ORAL
  Filled 2015-07-23: qty 1

## 2015-07-23 MED ORDER — SODIUM CHLORIDE 0.9% FLUSH
3.0000 mL | Freq: Two times a day (BID) | INTRAVENOUS | Status: DC
  Administered 2015-07-23: 3 mL via INTRAVENOUS

## 2015-07-23 NOTE — Progress Notes (Signed)
ANTICOAGULATION CONSULT NOTE - Initial Consult  Pharmacy Consult for heparin drip Indication: ACS/STEMI  Allergies  Allergen Reactions  . Orange Fruit [Citrus] Hives  . Penicillins Hives    Has patient had a PCN reaction causing immediate rash, facial/tongue/throat swelling, SOB or lightheadedness with hypotension: Yes Has patient had a PCN reaction causing severe rash involving mucus membranes or skin necrosis: No Has patient had a PCN reaction that required hospitalization No Has patient had a PCN reaction occurring within the last 10 years: No If all of the above answers are "NO", then may proceed with Cephalosporin use.     Patient Measurements: Height: 5\' 10"  (177.8 cm) Weight: 175 lb 4.8 oz (79.516 kg) IBW/kg (Calculated) : 73 Heparin Dosing Weight: 78kg  Vital Signs: Temp: 97.7 F (36.5 C) (01/29 0526) Temp Source: Oral (01/29 0526) BP: 124/58 mmHg (01/29 0526) Pulse Rate: 71 (01/29 0526)  Labs:  Recent Labs  07/21/15 2207 07/22/15 0107 07/22/15 0606 07/22/15 1143 07/22/15 2101 07/23/15 0552  HGB 10.8*  --   --  9.8*  --  10.2*  HCT 33.6*  --   --  31.1*  --  31.9*  PLT 194  --   --  180  --  172  APTT 39*  --   --   --   --   --   LABPROT 14.3  --   --   --   --   --   INR 1.09  --   --   --   --   --   HEPARINUNFRC  --   --   --  0.15* 0.12* 0.25*  CREATININE 3.69*  --   --   --   --   --   TROPONINI <0.03 0.36* 1.65* 1.33*  --   --     Estimated Creatinine Clearance: 18.4 mL/min (by C-G formula based on Cr of 3.69).   Medical History: Past Medical History  Diagnosis Date  . ESRD (end stage renal disease) ([place])   . Polycystic kidney disease   . Heart attack ([place])   . Prostate CA ([place])   . History of radiation therapy     for prostate CA  . Anemia   . Hyperlipidemia   . CAD (coronary artery disease)     s/p percutaneous coronary angioplasty, s/p CABG x 6   . Hypertension   . Osteoarthritis   . Shingles   . Hyperparathyroidism, secondary  renal (HCC)     Medications:    Assessment: Hgb 10.8  plt 194 INR 1.09  aPTT  39  Goal of Therapy:  Heparin level 0.3-0.7 units/ml Monitor platelets by anticoagulation protocol: Yes   Plan:  Heparin level is below goal so will bolus heparin 2300 units and increase infusion to 1500 units/hr. Will recheck HL in 8 hours.   1/29 AM heparin level 0.25. 1200 unit bolus and increase rate to 1650 units/hr. Recheck in 8 hours.   Nyelli Samara S 07-04-202017,7:03 AM

## 2015-07-23 NOTE — Progress Notes (Signed)
[facility] Cardiology [facility] Encounter Note  Patient: Jimmy Carson / Admit Date: 07/21/2015 / Date of Encounter: 03-Sep-202017, 6:28 AM   Subjective: No evidence of chest pain. Patient is having no evidence of significant cardiac symptoms at this time and hemodynamically stable  Review of Systems: Positive for: None Negative for: Vision change, hearing change, syncope, dizziness, nausea, vomiting,diarrhea, bloody stool, stomach pain, cough, congestion, diaphoresis, urinary frequency, urinary pain,skin lesions, skin rashes Others previously listed  Objective: Telemetry: Normal sinus rhythm Physical Exam: Blood pressure 124/58, pulse 71, temperature 97.7 F (36.5 C), temperature source Oral, resp. rate 16, height 5\' 10"  (1.778 m), weight 175 lb 4.8 oz (79.516 kg), SpO2 94 %. Body mass index is 25.15 kg/(m^2). General: Well developed, well nourished, in no acute distress. Head: Normocephalic, atraumatic, sclera non-icteric, no xanthomas, nares are without discharge. Neck: No apparent masses Lungs: Normal respirations with no wheezes, few rhonchi, no rales , no crackles   Heart: Regular rate and rhythm, normal S1 S2, 2-3+ aortic murmur, no rub, no gallop, PMI is normal size and placement, carotid upstroke normal with bruit, jugular venous pressure normal Abdomen: Soft, non-tender, non-distended with normoactive bowel sounds. No hepatosplenomegaly. Abdominal aorta is normal size without bruit Extremities: Trace edema, no clubbing, no cyanosis, no ulcers,  Peripheral: 2+ radial, 2+ femoral, 2+ dorsal pedal pulses with significant shunt and upper extremities causing murmur Neuro: Alert and oriented. Moves all extremities spontaneously. Psych:  Responds to questions appropriately with a normal affect.   Intake/Output Summary (Last 24 hours) at 07/23/15 0628 Last data filed at 07/23/15 0527  Gross per 24 hour  Intake   1071 ml  Output    375 ml  Net    696 ml    Inpatient  Medications:  . aspirin EC  81 mg Oral Daily  . cholecalciferol  1,000 Units Oral Daily  . cinacalcet  90 mg Oral Daily  . clopidogrel  75 mg Oral Daily  . docusate sodium  100 mg Oral BID  . gabapentin  100 mg Oral TID  . isosorbide mononitrate  60 mg Oral Daily  . rosuvastatin  10 mg Oral Daily  . sevelamer carbonate  2,400 mg Oral TID WC  . sodium chloride flush  3 mL Intravenous Q12H  . tamsulosin  0.4 mg Oral Daily  . traZODone  50 mg Oral QHS   Infusions:  . heparin 1,500 Units/hr (07/22/15 2200)    Labs:  Recent Labs  07/21/15 2207  NA 142  K 3.9  CL 97*  CO2 37*  GLUCOSE 180*  BUN 13  CREATININE 3.69*  CALCIUM 8.1*   No results for input(s): AST, ALT, ALKPHOS, BILITOT, PROT, ALBUMIN in the last 72 hours.  Recent Labs  07/22/15 1143 07/23/15 0552  WBC 6.2 5.7  HGB 9.8* 10.2*  HCT 31.1* 31.9*  MCV 88.0 87.7  PLT 180 172    Recent Labs  07/21/15 2207 07/22/15 0107 07/22/15 0606 07/22/15 1143  TROPONINI <0.03 0.36* 1.65* 1.33*   Invalid input(s): POCBNP  Recent Labs  07/21/15 2207  HGBA1C 5.1     Weights: Filed Weights   07/21/15 2157 07/22/15 0024 07/23/15 0526  Weight: 180 lb (81.647 kg) 172 lb 14.4 oz (78.427 kg) 175 lb 4.8 oz (79.516 kg)     Radiology/Studies:  Dg Chest Port 1 View  07/21/2015  CLINICAL DATA:  Midline chest pain starting tonight. EXAM: PORTABLE CHEST 1 VIEW COMPARISON:  07/05/2014. FINDINGS: 08/13/2006 hrs. low volume film. The cardio pericardial silhouette  is enlarged. There is pulmonary vascular congestion without overt pulmonary edema. No overt airspace pulmonary edema or focal lung consolidation. Patient is status post median sternotomy. Telemetry leads overlie the chest. Bones are diffusely demineralized. IMPRESSION: Low volume film with cardiomegaly and vascular congestion. Electronically Signed   By: Misty Stanley M.D.   On: 07/21/2015 22:30     Assessment and Recommendation  75 y.o. male with known coronary  artery disease status post coronary artery bypass graft chronic kidney disease stage V with chest pressure and pain and stable EKG showing normal sinus rhythm with left bundle-branch block and possible stress-induced myocardial infarction due to recent knowledge of his mother's death and non-ST elevation myocardial infarction needing further treatment options 1. Continue heparin for further risk reduction in cardiovascular event 2. Proceed to cardiac catheterization to assess coronary anatomy and graft anatomy and further treatment thereof is necessary. Patient understands the risk and benefits of cardiac catheterization. This includes a possibility of death stroke heart attack infection bleeding or blood clot. The patient 100 stands dizziness and is at low risk for sedation  Signed, Serafina Royals M.D. FACC

## 2015-07-23 NOTE — Progress Notes (Signed)
ANTICOAGULATION CONSULT NOTE - Initial Consult  Pharmacy Consult for heparin drip Indication: ACS/STEMI  Allergies  Allergen Reactions  . Orange Fruit [Citrus] Hives  . Penicillins Hives    Has patient had a PCN reaction causing immediate rash, facial/tongue/throat swelling, SOB or lightheadedness with hypotension: Yes Has patient had a PCN reaction causing severe rash involving mucus membranes or skin necrosis: No Has patient had a PCN reaction that required hospitalization No Has patient had a PCN reaction occurring within the last 10 years: No If all of the above answers are "NO", then may proceed with Cephalosporin use.     Patient Measurements: Height: 5\' 10"  (177.8 cm) Weight: 175 lb 4.8 oz (79.516 kg) IBW/kg (Calculated) : 73 Heparin Dosing Weight: 78kg  Vital Signs: Temp: 98.3 F (36.8 C) (01/29 2039) Temp Source: Oral (01/29 2039) BP: 130/62 mmHg (01/29 2039) Pulse Rate: 70 (01/29 2039)  Labs:  Recent Labs  07/21/15 2207 07/22/15 0107 07/22/15 0606 07/22/15 1143  07/23/15 0552 07/23/15 1500 07/23/15 2310  HGB 10.8*  --   --  9.8*  --  10.2*  --   --   HCT 33.6*  --   --  31.1*  --  31.9*  --   --   PLT 194  --   --  180  --  172  --   --   APTT 39*  --   --   --   --   --   --   --   LABPROT 14.3  --   --   --   --   --   --   --   INR 1.09  --   --   --   --   --   --   --   HEPARINUNFRC  --   --   --  0.15*  < > 0.25* 0.35 0.32  CREATININE 3.69*  --   --   --   --   --   --   --   TROPONINI <0.03 0.36* 1.65* 1.33*  --   --   --   --   < > = values in this interval not displayed.  Estimated Creatinine Clearance: 18.4 mL/min (by C-G formula based on Cr of 3.69).   Medical History: Past Medical History  Diagnosis Date  . ESRD (end stage renal disease) ([place])   . Polycystic kidney disease   . Heart attack ([place])   . Prostate CA ([place])   . History of radiation therapy     for prostate CA  . Anemia   . Hyperlipidemia   . CAD (coronary artery  disease)     s/p percutaneous coronary angioplasty, s/p CABG x 6   . Hypertension   . Osteoarthritis   . Shingles   . Hyperparathyroidism, secondary renal (HCC)     Medications:    Assessment: Hgb 10.8  plt 194 INR 1.09  aPTT  39  Goal of Therapy:  Heparin level 0.3-0.7 units/ml Monitor platelets by anticoagulation protocol: Yes   Plan:  Heparin level is at goal so will continue infusion at current rate and check a confirmatory level in 8 hours.   1/29 PM heparin level 0.32. Recheck with CBC tomorrow AM.   Jady Braggs S Jul 09, 202017,11:57 PM

## 2015-07-23 NOTE — Progress Notes (Signed)
ANTICOAGULATION CONSULT NOTE - Initial Consult  Pharmacy Consult for heparin drip Indication: ACS/STEMI  Allergies  Allergen Reactions  . Orange Fruit [Citrus] Hives  . Penicillins Hives    Has patient had a PCN reaction causing immediate rash, facial/tongue/throat swelling, SOB or lightheadedness with hypotension: Yes Has patient had a PCN reaction causing severe rash involving mucus membranes or skin necrosis: No Has patient had a PCN reaction that required hospitalization No Has patient had a PCN reaction occurring within the last 10 years: No If all of the above answers are "NO", then may proceed with Cephalosporin use.     Patient Measurements: Height: 5\' 10"  (177.8 cm) Weight: 175 lb 4.8 oz (79.516 kg) IBW/kg (Calculated) : 73 Heparin Dosing Weight: 78kg  Vital Signs: Temp: 98.5 F (36.9 C) (01/29 1117) Temp Source: Oral (01/29 1117) BP: 122/54 mmHg (01/29 1117) Pulse Rate: 70 (01/29 1117)  Labs:  Recent Labs  07/21/15 2207 07/22/15 0107 07/22/15 0606  07/22/15 1143 07/22/15 2101 07/23/15 0552 07/23/15 1500  HGB 10.8*  --   --   --  9.8*  --  10.2*  --   HCT 33.6*  --   --   --  31.1*  --  31.9*  --   PLT 194  --   --   --  180  --  172  --   APTT 39*  --   --   --   --   --   --   --   LABPROT 14.3  --   --   --   --   --   --   --   INR 1.09  --   --   --   --   --   --   --   HEPARINUNFRC  --   --   --   < > 0.15* 0.12* 0.25* 0.35  CREATININE 3.69*  --   --   --   --   --   --   --   TROPONINI <0.03 0.36* 1.65*  --  1.33*  --   --   --   < > = values in this interval not displayed.  Estimated Creatinine Clearance: 18.4 mL/min (by C-G formula based on Cr of 3.69).   Medical History: Past Medical History  Diagnosis Date  . ESRD (end stage renal disease) ([place])   . Polycystic kidney disease   . Heart attack ([place])   . Prostate CA ([place])   . History of radiation therapy     for prostate CA  . Anemia   . Hyperlipidemia   . CAD (coronary artery  disease)     s/p percutaneous coronary angioplasty, s/p CABG x 6   . Hypertension   . Osteoarthritis   . Shingles   . Hyperparathyroidism, secondary renal (HCC)     Medications:    Assessment: Hgb 10.8  plt 194 INR 1.09  aPTT  39  Goal of Therapy:  Heparin level 0.3-0.7 units/ml Monitor platelets by anticoagulation protocol: Yes   Plan:  Heparin level is at goal so will continue infusion at current rate and check a confirmatory level in 8 hours.    Ulice Dash D 07/04/202017,3:46 PM

## 2015-07-23 NOTE — Progress Notes (Signed)
[facility]  ROUNDING NOTE   Subjective:   Reports no chest pain, shortness of breath or palpitations.  Cardiac catheterization for tomorrow.   Objective:  Vital signs in last 24 hours:  Temp:  [97.7 F (36.5 C)-98.5 F (36.9 C)] 97.7 F (36.5 C) (01/29 0526) Pulse Rate:  [68-72] 71 (01/29 0526) Resp:  [16-18] 16 (01/29 0526) BP: (121-136)/(58-70) 124/58 mmHg (01/29 0526) SpO2:  [93 %-96 %] 94 % (01/29 0526) Weight:  [79.516 kg (175 lb 4.8 oz)] 79.516 kg (175 lb 4.8 oz) (01/29 0526)  Weight change: -2.132 kg (-4 lb 11.2 oz) Filed Weights   07/21/15 2157 07/22/15 0024 07/23/15 0526  Weight: 81.647 kg (180 lb) 78.427 kg (172 lb 14.4 oz) 79.516 kg (175 lb 4.8 oz)    Intake/Output: I/O last 3 completed shifts: In: 1071 [P.O.:960; I.V.:111] Out: 525 [Urine:525]   Intake/Output this shift:     Physical Exam: General: NAD, sitting in chair  Head: Normocephalic, atraumatic. Moist oral mucosal membranes  Eyes: Anicteric, PERRL  Neck: Supple, trachea midline  Lungs:  Clear to auscultation  Heart: Regular rate and rhythm  Abdomen:  Soft, nontender,   Extremities: no peripheral edema.  Neurologic: Nonfocal, moving all four extremities  Skin: No lesions  Access: Right arm AVF, right forearm thrombosed AVF    Basic Metabolic Panel:  Recent Labs Lab 07/21/15 2207  NA 142  K 3.9  CL 97*  CO2 37*  GLUCOSE 180*  BUN 13  CREATININE 3.69*  CALCIUM 8.1*    Liver Function Tests: No results for input(s): AST, ALT, ALKPHOS, BILITOT, PROT, ALBUMIN in the last 168 hours. No results for input(s): LIPASE, AMYLASE in the last 168 hours. No results for input(s): AMMONIA in the last 168 hours.  CBC:  Recent Labs Lab 07/21/15 2207 07/22/15 1143 07/23/15 0552  WBC 6.0 6.2 5.7  HGB 10.8* 9.8* 10.2*  HCT 33.6* 31.1* 31.9*  MCV 88.7 88.0 87.7  PLT 194 180 172    Cardiac Enzymes:  Recent Labs Lab 07/21/15 2207 07/22/15 0107 07/22/15 0606 07/22/15 1143   TROPONINI <0.03 0.36* 1.65* 1.33*    BNP: Invalid input(s): POCBNP  CBG: No results for input(s): GLUCAP in the last 168 hours.  Microbiology: Results for orders placed or performed in visit on 03/29/13  Body fluid culture     Status: None   Collection Time: 03/29/13  4:05 PM  Result Value Ref Range Status   Micro Text Report   Final       SOURCE: R KNEE    ORGANISM 1                RARE COAGULASE NEGATIVE STAPHYLOCOCCUS   COMMENT                   CALL LAB IF SENSITIVITY TESTING REQUIRED   COMMENT                   NO ANAEROBES ISOLATED IN 4 DAYS   GRAM STAIN                NO ORGANISMS SEEN   GRAM STAIN                RARE WHITE BLOOD CELLS   ANTIBIOTIC  Wound culture     Status: None   Collection Time: 03/29/13  4:12 PM  Result Value Ref Range Status   Micro Text Report   Final       COMMENT                   NO GROWTH AEROBICALLY/ANAEROBICALLY IN 4 DAYS   GRAM STAIN                NO ORGANISMS SEEN   GRAM STAIN                RARE WHITE BLOOD CELLS   ANTIBIOTIC                                                      Wound culture     Status: None   Collection Time: 03/29/13  4:37 PM  Result Value Ref Range Status   Micro Text Report   Final       SOURCE: right knee notch    COMMENT                   NO GROWTH AEROBICALLY/ANAEROBICALLY IN 4 DAYS   GRAM STAIN                NO ORGANISMS SEEN   GRAM STAIN                MODERATE WHITE BLOOD CELLS   ANTIBIOTIC                                                        Coagulation Studies:  Recent Labs  07/21/15 2207  LABPROT 14.3  INR 1.09    Urinalysis: No results for input(s): COLORURINE, LABSPEC, PHURINE, GLUCOSEU, HGBUR, BILIRUBINUR, KETONESUR, PROTEINUR, UROBILINOGEN, NITRITE, LEUKOCYTESUR in the last 72 hours.  Invalid input(s): APPERANCEUR    Imaging: Dg Chest Port 1 View  07/21/2015  CLINICAL DATA:  Midline chest pain starting tonight. EXAM:  PORTABLE CHEST 1 VIEW COMPARISON:  07/05/2014. FINDINGS: 08/13/2006 hrs. low volume film. The cardio pericardial silhouette is enlarged. There is pulmonary vascular congestion without overt pulmonary edema. No overt airspace pulmonary edema or focal lung consolidation. Patient is status post median sternotomy. Telemetry leads overlie the chest. Bones are diffusely demineralized. IMPRESSION: Low volume film with cardiomegaly and vascular congestion. Electronically Signed   By: Misty Stanley M.D.   On: 07/21/2015 22:30     Medications:   . heparin 1,650 Units/hr (07/23/15 0840)   . aspirin EC  81 mg Oral Daily  . cholecalciferol  1,000 Units Oral Daily  . cinacalcet  90 mg Oral Daily  . clopidogrel  75 mg Oral Daily  . docusate sodium  100 mg Oral BID  . gabapentin  100 mg Oral TID  . isosorbide mononitrate  60 mg Oral Daily  . rosuvastatin  10 mg Oral Daily  . sevelamer carbonate  2,400 mg Oral TID WC  . sodium chloride flush  3 mL Intravenous Q12H  . tamsulosin  0.4 mg Oral Daily  . traZODone  50 mg Oral QHS   acetaminophen **OR** acetaminophen, morphine injection, ondansetron **OR** ondansetron (ZOFRAN)  IV  Assessment/ Plan:  [name] is a 75 y.o.white  male with hypothyroidism, hypertension, BPH, CAD status post CABG, hyperlipidemia.   [facility] Nephrology [place] MWF  1. End Stage Renal Disease:  - Continue MWF schedule. Next treatment for Monday - Renally dose all medications  2. Hypertension: blood pressure well controlled.  - Imdur and tamsulosin  3. Secondary Hyperparathyroidism:  - sevelamer to 3 tabs with meals - continue cinacalcet.   4. Anemia of chronic kidney disease: hemoglobin 10.2 - with ischemia, will hold epo. Gets micera as outpatient.    LOS: 1 Kaitlin Alcindor 03/22/2020179:57 AM

## 2015-07-23 NOTE — Progress Notes (Signed)
[facility] [place] NAME: Jimmy Carson    MR#:  [id number]  DATE OF BIRTH:  02-06-1941  SUBJECTIVE:  CHIEF COMPLAINT:   Chief Complaint  Patient presents with  . Chest Pain   No further chest pain Significantly elevated troponin. Started on heparin drip. Scheduled for Cath tomorrow.  REVIEW OF SYSTEMS:    Review of Systems  Constitutional: Negative for fever and chills.  HENT: Negative for sore throat.   Eyes: Negative for blurred vision, double vision and pain.  Respiratory: Negative for cough, hemoptysis, shortness of breath and wheezing.   Cardiovascular: Negative for chest pain, palpitations, orthopnea and leg swelling.  Gastrointestinal: Negative for heartburn, nausea, vomiting, abdominal pain, diarrhea and constipation.  Genitourinary: Negative for dysuria and hematuria.  Musculoskeletal: Negative for back pain and joint pain.  Skin: Negative for rash.  Neurological: Negative for sensory change, speech change, focal weakness and headaches.  Endo/Heme/Allergies: Does not bruise/bleed easily.  Psychiatric/Behavioral: Negative for depression. The patient is not nervous/anxious.     DRUG ALLERGIES:   Allergies  Allergen Reactions  . Orange Fruit [Citrus] Hives  . Penicillins Hives    Has patient had a PCN reaction causing immediate rash, facial/tongue/throat swelling, SOB or lightheadedness with hypotension: Yes Has patient had a PCN reaction causing severe rash involving mucus membranes or skin necrosis: No Has patient had a PCN reaction that required hospitalization No Has patient had a PCN reaction occurring within the last 10 years: No If all of the above answers are "NO", then may proceed with Cephalosporin use.     VITALS:  Blood pressure 122/54, pulse 70, temperature 98.5 F (36.9 C), temperature source Oral, resp. rate 18, height 5\' 10"  (1.778 m), weight 79.516 kg (175 lb 4.8 oz), SpO2 96  %.  PHYSICAL EXAMINATION:   Physical Exam  GENERAL:  75 y.o.-year-old patient lying in the bed with no acute distress.  EYES: Pupils equal, round, reactive to light and accommodation. No scleral icterus. Extraocular muscles intact.  HEENT: Head atraumatic, normocephalic. Oropharynx and nasopharynx clear.  NECK:  Supple, no jugular venous distention. No thyroid enlargement, no tenderness.  LUNGS: Normal breath sounds bilaterally, no wheezing, rales, rhonchi. No use of accessory muscles of respiration.  CARDIOVASCULAR: S1, S2 normal. No murmurs, rubs, or gallops.  ABDOMEN: Soft, nontender, nondistended. Bowel sounds present. No organomegaly or mass.  EXTREMITIES: No cyanosis, clubbing or edema b/l.    NEUROLOGIC: Cranial nerves II through XII are intact. No focal Motor or sensory deficits b/l.   PSYCHIATRIC: The patient is alert and oriented x 3.  SKIN: No obvious rash, lesion, or ulcer.   LUE AVG   LABORATORY PANEL:   CBC  Recent Labs Lab 07/23/15 0552  WBC 5.7  HGB 10.2*  HCT 31.9*  PLT 172   ------------------------------------------------------------------------------------------------------------------  Chemistries   Recent Labs Lab 07/21/15 2207  NA 142  K 3.9  CL 97*  CO2 37*  GLUCOSE 180*  BUN 13  CREATININE 3.69*  CALCIUM 8.1*   ------------------------------------------------------------------------------------------------------------------  Cardiac Enzymes  Recent Labs Lab 07/22/15 1143  TROPONINI 1.33*   ------------------------------------------------------------------------------------------------------------------  RADIOLOGY:  Dg Chest Port 1 View  07/21/2015  CLINICAL DATA:  Midline chest pain starting tonight. EXAM: PORTABLE CHEST 1 VIEW COMPARISON:  07/05/2014. FINDINGS: 08/13/2006 hrs. low volume film. The cardio pericardial silhouette is enlarged. There is pulmonary vascular congestion without overt pulmonary edema. No overt airspace  pulmonary edema or focal lung consolidation. Patient is status post  median sternotomy. Telemetry leads overlie the chest. Bones are diffusely demineralized. IMPRESSION: Low volume film with cardiomegaly and vascular congestion. Electronically Signed   By: Misty Stanley M.D.   On: 07/21/2015 22:30     ASSESSMENT AND PLAN:   This is a 75 year old Caucasian male with coronary artery disease admitted for chest pain.  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 Continue Sensipar and Renvela. Consulted Nephrology. Due again on Monday.  * BPH: Continue Flomax  * DVT prophylaxis On heparin drip  All the records are reviewed and case discussed with Care Management/Social Workerr. Management plans discussed with the patient, family and they are in agreement.  CODE STATUS: FULL  DVT Prophylaxis: SCDs  TOTAL TIME TAKING CARE OF THIS PATIENT: 30 minutes.   POSSIBLE D/C IN 1-2 DAYS, DEPENDING ON CLINICAL CONDITION.   Hillary Bow R M.D on 29-Dec-202017 at 1:04 PM  Between 7am to 6pm - Pager - [phone]  After 6pm go to [email or web address] - password [email or web address] [facility]  Office  [phone]  CC: Primary care physician; Dion Body, MD    Note: This dictation was prepared with Dragon dictation along with smaller phrase technology. Any transcriptional errors that result from this process are unintentional.

## 2015-07-23 NOTE — Progress Notes (Signed)
HD patient. NSR. Room air. Heparin gtt at 16.5. A &O. Self. Takes meds ok. Cath scheduled for 1/30 with HD to follow. Pt reported no pain. Pt has no further concerns at this time.

## 2015-07-24 ENCOUNTER — Encounter: Payer: Self-pay | Admitting: Certified Registered Nurse Anesthetist

## 2015-07-24 ENCOUNTER — Encounter
Admission: EM | Disposition: A | Discharge status: Home / Self Care | Payer: Self-pay | Source: Home / Self Care | Attending: Internal Medicine

## 2015-07-24 ENCOUNTER — Encounter: Payer: Self-pay | Admitting: Internal Medicine

## 2015-07-24 HISTORY — PX: CARDIAC CATHETERIZATION: SHX172

## 2015-07-24 LAB — CBC
HCT: 32 % — ABNORMAL LOW (ref 40.0–52.0)
HEMOGLOBIN: 10.4 g/dL — AB (ref 13.0–18.0)
MCH: 28.8 pg (ref 26.0–34.0)
MCHC: 32.4 g/dL (ref 32.0–36.0)
MCV: 89 fL (ref 80.0–100.0)
PLATELETS: 170 10*3/uL (ref 150–440)
RBC: 3.6 MIL/uL — ABNORMAL LOW (ref 4.40–5.90)
RDW: 20.4 % — ABNORMAL HIGH (ref 11.5–14.5)
WBC: 6.1 10*3/uL (ref 3.8–10.6)

## 2015-07-24 LAB — HEPARIN LEVEL (UNFRACTIONATED)
HEPARIN UNFRACTIONATED: 0.29 [IU]/mL — AB (ref 0.30–0.70)
Heparin Unfractionated: <0.1 IU/mL — ABNORMAL LOW (ref 0.30–0.70)

## 2015-07-24 SURGERY — LEFT HEART CATH AND CORONARY ANGIOGRAPHY
Anesthesia: Moderate Sedation

## 2015-07-24 MED ORDER — HEPARIN BOLUS VIA INFUSION
1200.0000 [IU] | Freq: Once | INTRAVENOUS | Status: AC
  Administered 2015-07-24: 1200 [IU] via INTRAVENOUS
  Filled 2015-07-24: qty 1200

## 2015-07-24 MED ORDER — MIDAZOLAM HCL 2 MG/2ML IJ SOLN
INTRAMUSCULAR | Status: AC
  Filled 2015-07-24: qty 2

## 2015-07-24 MED ORDER — MIDAZOLAM HCL 2 MG/2ML IJ SOLN
INTRAMUSCULAR | Status: DC | PRN
  Administered 2015-07-24: 1 mg via INTRAVENOUS

## 2015-07-24 MED ORDER — FENTANYL CITRATE (PF) 100 MCG/2ML IJ SOLN
INTRAMUSCULAR | Status: AC
  Filled 2015-07-24: qty 2

## 2015-07-24 MED ORDER — ASPIRIN 81 MG PO TBEC: 81.0000 mg | DELAYED_RELEASE_TABLET | Freq: Every day | ORAL | Status: DC

## 2015-07-24 MED ORDER — FENTANYL CITRATE (PF) 100 MCG/2ML IJ SOLN
INTRAMUSCULAR | Status: DC | PRN
  Administered 2015-07-24: 50 ug via INTRAVENOUS

## 2015-07-24 MED ORDER — HEPARIN (PORCINE) IN NACL 2-0.9 UNIT/ML-% IJ SOLN
INTRAMUSCULAR | Status: AC
  Filled 2015-07-24: qty 1000

## 2015-07-24 MED ORDER — IOHEXOL 300 MG/ML  SOLN
INTRAMUSCULAR | Status: DC | PRN
  Administered 2015-07-24: 160 mL via INTRA_ARTERIAL

## 2015-07-24 SURGICAL SUPPLY — 11 items
CATH INFINITI 5 FR IM (CATHETERS) ×3 IMPLANT
CATH INFINITI 5 FR RCB (CATHETERS) ×3 IMPLANT
CATH INFINITI 5FR ANG PIGTAIL (CATHETERS) ×3 IMPLANT
CATH INFINITI 5FR JL4 (CATHETERS) ×3 IMPLANT
CATH INFINITI JR4 5F (CATHETERS) ×3 IMPLANT
KIT MANI 3VAL PERCEP (MISCELLANEOUS) ×3 IMPLANT
NEEDLE PERC 18GX7CM (NEEDLE) ×3 IMPLANT
PACK CARDIAC CATH (CUSTOM PROCEDURE TRAY) ×3 IMPLANT
SHEATH PINNACLE 5F 10CM (SHEATH) ×3 IMPLANT
WIRE EMERALD 3MM-J .035X150CM (WIRE) ×3 IMPLANT
WIRE EMERALD 3MM-J .035X260CM (WIRE) ×3 IMPLANT

## 2015-07-24 NOTE — Progress Notes (Signed)
ANTICOAGULATION CONSULT NOTE - Initial Consult  Pharmacy Consult for heparin drip Indication: ACS/STEMI  Allergies  Allergen Reactions  . Orange Fruit [Citrus] Hives  . Penicillins Hives    Has patient had a PCN reaction causing immediate rash, facial/tongue/throat swelling, SOB or lightheadedness with hypotension: Yes Has patient had a PCN reaction causing severe rash involving mucus membranes or skin necrosis: No Has patient had a PCN reaction that required hospitalization No Has patient had a PCN reaction occurring within the last 10 years: No If all of the above answers are "NO", then may proceed with Cephalosporin use.     Patient Measurements: Height: 5\' 10"  (177.8 cm) Weight: 175 lb 4.8 oz (79.516 kg) IBW/kg (Calculated) : 73 Heparin Dosing Weight: 78kg  Vital Signs: Temp: 98.3 F (36.8 C) (01/29 2039) Temp Source: Oral (01/29 2039) BP: 130/62 mmHg (01/29 2039) Pulse Rate: 70 (01/29 2039)  Labs:  Recent Labs  07/21/15 2207 07/22/15 0107 07/22/15 0606 07/22/15 1143  07/23/15 0552 07/23/15 1500 07/23/15 2310 07/24/15 0458  HGB 10.8*  --   --  9.8*  --  10.2*  --   --  10.4*  HCT 33.6*  --   --  31.1*  --  31.9*  --   --  32.0*  PLT 194  --   --  180  --  172  --   --  170  APTT 39*  --   --   --   --   --   --   --   --   LABPROT 14.3  --   --   --   --   --   --   --   --   INR 1.09  --   --   --   --   --   --   --   --   HEPARINUNFRC  --   --   --  0.15*  < > 0.25* 0.35 0.32 0.29*  CREATININE 3.69*  --   --   --   --   --   --   --   --   TROPONINI <0.03 0.36* 1.65* 1.33*  --   --   --   --   --   < > = values in this interval not displayed.  Estimated Creatinine Clearance: 18.4 mL/min (by C-G formula based on Cr of 3.69).   Medical History: Past Medical History  Diagnosis Date  . ESRD (end stage renal disease) ([place])   . Polycystic kidney disease   . Heart attack ([place])   . Prostate CA ([place])   . History of radiation therapy     for prostate CA   . Anemia   . Hyperlipidemia   . CAD (coronary artery disease)     s/p percutaneous coronary angioplasty, s/p CABG x 6   . Hypertension   . Osteoarthritis   . Shingles   . Hyperparathyroidism, secondary renal ([facility])     Medications:    Assessment: Hgb 10.8  plt 194 INR 1.09  aPTT  39  Goal of Therapy:  Heparin level 0.3-0.7 units/ml Monitor platelets by anticoagulation protocol: Yes   Plan:  Heparin level is at goal so will continue infusion at current rate and check a confirmatory level in 8 hours.   1/29 PM heparin level 0.32. Recheck with CBC tomorrow AM.  1/30 AM heparin level 0.29. 1200 unit bolus and increase to 1800 units/hr. Recheck in 8 hours.   Danzel Marszalek S  07/24/2015,5:34 AM

## 2015-07-24 NOTE — Care Management Important Message (Signed)
Important Message  Patient Details  Name: Jimmy Carson MRN: [id number] Date of Birth: 06-Nov-1940   Medicare Important Message Given:  Yes    Juliann Pulse A [name] 07/24/2015, 10:09 AM

## 2015-07-24 NOTE — Progress Notes (Signed)
[facility]  ROUNDING NOTE   Subjective:   Reports no chest pain, shortness of breath or palpitations.  awaiting catheterization later today  Objective:  Vital signs in last 24 hours:  Temp:  [97.6 F (36.4 C)-98.3 F (36.8 C)] 97.6 F (36.4 C) (01/30 0559) Pulse Rate:  [66-70] 66 (01/30 1116) Resp:  [16-19] 19 (01/30 1116) BP: (128-147)/(62-78) 147/78 mmHg (01/30 1116) SpO2:  [92 %-97 %] 97 % (01/30 1116) Weight:  [81.557 kg (179 lb 12.8 oz)] 81.557 kg (179 lb 12.8 oz) (01/30 0559)  Weight change: 2.041 kg (4 lb 8 oz) Filed Weights   07/22/15 0024 07/23/15 0526 07/24/15 0559  Weight: 78.427 kg (172 lb 14.4 oz) 79.516 kg (175 lb 4.8 oz) 81.557 kg (179 lb 12.8 oz)    Intake/Output: I/O last 3 completed shifts: In: [id number] [P.O.:1200; I.V.:441] Out: 650 [Urine:650]   Intake/Output this shift:  Total I/O In: 360 [P.O.:360] Out: 150 [Urine:150]  Physical Exam: General: NAD, sitting in chair  Head: Normocephalic, atraumatic. Moist oral mucosal membranes  Eyes: Anicteric,    Neck: Supple, trachea midline  Lungs:  Clear to auscultation  Heart: Regular rate and rhythm  Abdomen:  Soft, nontender,   Extremities: no peripheral edema.  Neurologic: Nonfocal, moving all four extremities  Skin: No lesions  Access: aneurysmal AVF     Basic Metabolic Panel:  Recent Labs Lab 07/21/15 2207  NA 142  K 3.9  CL 97*  CO2 37*  GLUCOSE 180*  BUN 13  CREATININE 3.69*  CALCIUM 8.1*    Liver Function Tests: No results for input(s): AST, ALT, ALKPHOS, BILITOT, PROT, ALBUMIN in the last 168 hours. No results for input(s): LIPASE, AMYLASE in the last 168 hours. No results for input(s): AMMONIA in the last 168 hours.  CBC:  Recent Labs Lab 07/21/15 2207 07/22/15 1143 07/23/15 0552 07/24/15 0458  WBC 6.0 6.2 5.7 6.1  HGB 10.8* 9.8* 10.2* 10.4*  HCT 33.6* 31.1* 31.9* 32.0*  MCV 88.7 88.0 87.7 89.0  PLT 194 180 172 170    Cardiac Enzymes:  Recent  Labs Lab 07/21/15 2207 07/22/15 0107 07/22/15 0606 07/22/15 1143  TROPONINI <0.03 0.36* 1.65* 1.33*    BNP: Invalid input(s): POCBNP  CBG: No results for input(s): GLUCAP in the last 168 hours.  Microbiology: Results for orders placed or performed in visit on 03/29/13  Body fluid culture     Status: None   Collection Time: 03/29/13  4:05 PM  Result Value Ref Range Status   Micro Text Report   Final       SOURCE: R KNEE    ORGANISM 1                RARE COAGULASE NEGATIVE STAPHYLOCOCCUS   COMMENT                   CALL LAB IF SENSITIVITY TESTING REQUIRED   COMMENT                   NO ANAEROBES ISOLATED IN 4 DAYS   GRAM STAIN                NO ORGANISMS SEEN   GRAM STAIN                RARE WHITE BLOOD CELLS   ANTIBIOTIC  Wound culture     Status: None   Collection Time: 03/29/13  4:12 PM  Result Value Ref Range Status   Micro Text Report   Final       COMMENT                   NO GROWTH AEROBICALLY/ANAEROBICALLY IN 4 DAYS   GRAM STAIN                NO ORGANISMS SEEN   GRAM STAIN                RARE WHITE BLOOD CELLS   ANTIBIOTIC                                                      Wound culture     Status: None   Collection Time: 03/29/13  4:37 PM  Result Value Ref Range Status   Micro Text Report   Final       SOURCE: right knee notch    COMMENT                   NO GROWTH AEROBICALLY/ANAEROBICALLY IN 4 DAYS   GRAM STAIN                NO ORGANISMS SEEN   GRAM STAIN                MODERATE WHITE BLOOD CELLS   ANTIBIOTIC                                                        Coagulation Studies:  Recent Labs  07/21/15 2207  LABPROT 14.3  INR 1.09    Urinalysis: No results for input(s): COLORURINE, LABSPEC, PHURINE, GLUCOSEU, HGBUR, BILIRUBINUR, KETONESUR, PROTEINUR, UROBILINOGEN, NITRITE, LEUKOCYTESUR in the last 72 hours.  Invalid input(s): APPERANCEUR    Imaging: No results  found.   Medications:   . sodium chloride    . heparin 1,800 Units/hr (07/24/15 [id number])   . aspirin EC  81 mg Oral Daily  . cholecalciferol  1,000 Units Oral Daily  . cinacalcet  90 mg Oral Daily  . clopidogrel  75 mg Oral Daily  . docusate sodium  100 mg Oral BID  . gabapentin  100 mg Oral TID  . isosorbide mononitrate  60 mg Oral Daily  . rosuvastatin  10 mg Oral Daily  . sevelamer carbonate  2,400 mg Oral TID WC  . sodium chloride flush  3 mL Intravenous Q12H  . sodium chloride flush  3 mL Intravenous Q12H  . tamsulosin  0.4 mg Oral Daily  . traZODone  50 mg Oral QHS   sodium chloride, acetaminophen **OR** acetaminophen, morphine injection, ondansetron **OR** ondansetron (ZOFRAN) IV, sodium chloride flush  Assessment/ Plan:  [name] is a 75 y.o.white  male with hypothyroidism, hypertension, BPH, CAD status post CABG, hyperlipidemia.   [facility] Nephrology [place] MWF  1. End Stage Renal Disease:  - Continue MWF schedule.  - dialysis later today  2. Hypertension: blood pressure well controlled.  - Imdur and tamsulosin  3. Secondary Hyperparathyroidism:  - sevelamer to 3 tabs with meals -  continue cinacalcet.   4. Anemia of chronic kidney disease: hemoglobin 10.4 - with ischemia, will hold epo. Gets micera as outpatient.    LOS: 2 Jimmy Carson 1/30/201712:17 PM

## 2015-07-24 NOTE — Progress Notes (Signed)
[facility] Card[facility] Encounter Note  Patient: Jimmy Carson / Admit Date: 07/21/2015 / Date of Encounter: 07/24/2015, 1:17 PM   Subjective: No evidence of chest pain. Patient is having no evidence of significant cardiac symptoms at this time and hemodynamically stable. calims that recent stress of mother dying is concern  Review of Systems: Positive for: None Negative for: Vision change, hearing change, syncope, dizziness, nausea, vomiting,diarrhea, bloody stool, stomach pain, cough, congestion, diaphoresis, urinary frequency, urinary pain,skin lesions, skin rashes Others previously listed  Objective: Telemetry: Normal sinus rhythm Physical Exam: Blood pressure 142/76, pulse 66, temperature 98.3 F (36.8 C), temperature source Oral, resp. rate 20, height 5\' 10"  (1.778 m), weight 179 lb 12.8 oz (81.557 kg), SpO2 89 %. Body mass index is 25.8 kg/(m^2). General: Well developed, well nourished, in no acute distress. Head: Normocephalic, atraumatic, sclera non-icteric, no xanthomas, nares are without discharge. Neck: No apparent masses Lungs: Normal respirations with no wheezes, few rhonchi, no rales , no crackles   Heart: Regular rate and rhythm, normal S1 S2, 2-3+ aortic murmur, no rub, no gallop, PMI is normal size and placement, carotid upstroke normal with bruit, jugular venous pressure normal Abdomen: Soft, non-tender, non-distended with normoactive bowel sounds. No hepatosplenomegaly. Abdominal aorta is normal size without bruit Extremities: Trace edema, no clubbing, no cyanosis, no ulcers,  Peripheral: 2+ radial, 2+ femoral, 2+ dorsal pedal pulses with significant shunt and upper extremities causing murmur Neuro: Alert and oriented. Moves all extremities spontaneously. Psych:  Responds to questions appropriately with a normal affect.   Intake/Output Summary (Last 24 hours) at 07/24/15 1317 Last data filed at 07/24/15 1047  Gross per 24 hour  Intake   1170 ml   Output    550 ml  Net    620 ml    Inpatient Medications:  . [MAR Hold] aspirin EC  81 mg Oral Daily  . [MAR Hold] cholecalciferol  1,000 Units Oral Daily  . [MAR Hold] cinacalcet  90 mg Oral Daily  . [MAR Hold] clopidogrel  75 mg Oral Daily  . [MAR Hold] docusate sodium  100 mg Oral BID  . [MAR Hold] gabapentin  100 mg Oral TID  . [MAR Hold] isosorbide mononitrate  60 mg Oral Daily  . [MAR Hold] rosuvastatin  10 mg Oral Daily  . [MAR Hold] sevelamer carbonate  2,400 mg Oral TID WC  . [MAR Hold] sodium chloride flush  3 mL Intravenous Q12H  . sodium chloride flush  3 mL Intravenous Q12H  . [MAR Hold] tamsulosin  0.4 mg Oral Daily  . [MAR Hold] traZODone  50 mg Oral QHS   Infusions:  . sodium chloride    . heparin 1,800 Units/hr (07/24/15 [id number])    Labs:  Recent Labs  07/21/15 2207  NA 142  K 3.9  CL 97*  CO2 37*  GLUCOSE 180*  BUN 13  CREATININE 3.69*  CALCIUM 8.1*   No results for input(s): AST, ALT, ALKPHOS, BILITOT, PROT, ALBUMIN in the last 72 hours.  Recent Labs  07/23/15 0552 07/24/15 0458  WBC 5.7 6.1  HGB 10.2* 10.4*  HCT 31.9* 32.0*  MCV 87.7 89.0  PLT 172 170    Recent Labs  07/21/15 2207 07/22/15 0107 07/22/15 0606 07/22/15 1143  TROPONINI <0.03 0.36* 1.65* 1.33*   Invalid input(s): POCBNP  Recent Labs  07/21/15 2207  HGBA1C 5.1     Weights: Filed Weights   07/22/15 0024 07/23/15 0526 07/24/15 0559  Weight: 172 lb 14.4 oz (78.427 kg) 175  lb 4.8 oz (79.516 kg) 179 lb 12.8 oz (81.557 kg)     Radiology/Studies:  Dg Chest Port 1 View  07/21/2015  CLINICAL DATA:  Midline chest pain starting tonight. EXAM: PORTABLE CHEST 1 VIEW COMPARISON:  07/05/2014. FINDINGS: 08/13/2006 hrs. low volume film. The cardio pericardial silhouette is enlarged. There is pulmonary vascular congestion without overt pulmonary edema. No overt airspace pulmonary edema or focal lung consolidation. Patient is status post median sternotomy. Telemetry leads overlie  the chest. Bones are diffusely demineralized. IMPRESSION: Low volume film with cardiomegaly and vascular congestion. Electronically Signed   By: Misty Stanley M.D.   On: 07/21/2015 22:30     Assessment and Recommendation  75 y.o. male with known coronary artery disease status post coronary artery bypass graft chronic kidney disease stage V with chest pressure and pain and stable EKG showing normal sinus rhythm with left bundle-branch block and possible stress-induced myocardial infarction due to recent knowledge of his mother's death and non-ST elevation myocardial infarction needing further treatment options Cardiac cath with patent grafts and known old occluded native vessels 1. Continue asa and possible use of plavix due to nstemi if able 2. Proceed to dialysis without restricion 3. imdur for cad and chest pain 4.crestor for lipid mgt 5.b-blocker if able due to mi- 6. Cardiac rehab 7. No further cardiac diagnositcs needed at this time  Signed, Serafina Royals M.D. FACC

## 2015-07-24 NOTE — Discharge Instructions (Signed)
°  DIET:  Renal diet  DISCHARGE CONDITION:  Stable  ACTIVITY:  Activity as tolerated  OXYGEN:  Home Oxygen: No.   Oxygen Delivery: room air  DISCHARGE LOCATION:  home   If you experience worsening of your admission symptoms, develop shortness of breath, life threatening emergency, suicidal or homicidal thoughts you must seek medical attention immediately by calling 911 or calling your MD immediately  if symptoms less severe.  You Must read complete instructions/literature along with all the possible adverse reactions/side effects for all the Medicines you take and that have been prescribed to you. Take any new Medicines after you have completely understood and accpet all the possible adverse reactions/side effects.   Please note  You were cared for by a hospitalist during your hospital stay. If you have any questions about your discharge medications or the care you received while you were in the hospital after you are discharged, you can call the unit and asked to speak with the hospitalist on call if the hospitalist that took care of you is not available. Once you are discharged, your primary care physician will handle any further medical issues. Please note that NO REFILLS for any discharge medications will be authorized once you are discharged, as it is imperative that you return to your primary care physician (or establish a relationship with a primary care physician if you do not have one) for your aftercare needs so that they can reassess your need for medications and monitor your lab values.

## 2015-07-26 NOTE — Discharge Summary (Signed)
[facility] [place] NAME: Jimmy Carson    MR#:  [id number]  DATE OF BIRTH:  [birth year]  DATE OF ADMISSION:  07/21/2015 ADMITTING PHYSICIAN: Harrie Foreman, MD  DATE OF DISCHARGE: 07/24/2015  7:24 PM  PRIMARY CARE PHYSICIAN: Dion Body, MD    ADMISSION DIAGNOSIS:  Chest pain [R07.9]  DISCHARGE DIAGNOSIS:  Active Problems:   Chest pain   NSTEMI (non-ST elevated myocardial infarction) ([place])   SECONDARY DIAGNOSIS:   Past Medical History  Diagnosis Date  . ESRD (end stage renal disease) ([place])   . Polycystic kidney disease   . Heart attack ([place])   . Prostate CA ([place])   . History of radiation therapy     for prostate CA  . Anemia   . Hyperlipidemia   . CAD (coronary artery disease)     s/p percutaneous coronary angioplasty, s/p CABG x 6   . Hypertension   . Osteoarthritis   . Shingles   . Hyperparathyroidism, secondary renal ([place])      ADMITTING HISTORY  Chief Complaint: Chest pain HPI: The patient presents emergency department complaining of chest pain that began at rest. The patient was in bed when he felt as if "an elephant was standing on his chest." He denies associated nausea, vomiting, shortness of breath or diaphoresis. It did not radiate. The patient states he did not have nitroglycerin at home so he took some of his long-acting nitrate. However his chest pain did not improve until he received aspirin and nitroglycerin paste at the emergency department. Initial troponin was negative and there are no EKG changes, however due to his history of coronary artery disease emergency department staff called for admission.   HOSPITAL COURSE:   Admitted to telemetry for NSTEMI and started on Heparin drip, ASA, statin. Seen by cardiology and pt had cath which showed no significant stenosis. Troponin leak thought to be likely from stress. Discharged home in stable condition to follow with PCP and cardiology  in 1 week.  ESRD- Had HD prior to discharge   CONSULTS OBTAINED:  Treatment Team:  Corey Skains, MD Lavonia Dana, MD  DRUG ALLERGIES:   Allergies  Allergen Reactions  . Orange Fruit [Citrus] Hives  . Penicillins Hives    Has patient had a PCN reaction causing immediate rash, facial/tongue/throat swelling, SOB or lightheadedness with hypotension: Yes Has patient had a PCN reaction causing severe rash involving mucus membranes or skin necrosis: No Has patient had a PCN reaction that required hospitalization No Has patient had a PCN reaction occurring within the last 10 years: No If all of the above answers are "NO", then may proceed with Cephalosporin use.     DISCHARGE MEDICATIONS:   Discharge Medication List as of 07/24/2015  6:44 PM    START taking these medications   Details  aspirin EC 81 MG EC tablet Take 1 tablet (81 mg total) by mouth daily., Starting 07/24/2015, Until Discontinued, OTC      CONTINUE these medications which have NOT CHANGED   Details  acetaminophen (TYLENOL) 325 MG tablet Take 650 mg by mouth every 6 (six) hours as needed., Until Discontinued, Historical Med    cholecalciferol (VITAMIN D) 400 UNITS TABS tablet Take 1,000 Units by mouth., Until Discontinued, Historical Med    cinacalcet (SENSIPAR) 90 MG tablet Take 90 mg by mouth daily., Until Discontinued, Historical Med    clopidogrel (PLAVIX) 75 MG tablet Take 75 mg by mouth daily., Until Discontinued,  Historical Med    gabapentin (NEURONTIN) 100 MG capsule Take 100 mg by mouth 3 (three) times daily., Until Discontinued, Historical Med    isosorbide mononitrate (IMDUR) 60 MG 24 hr tablet Take 60 mg by mouth daily., Until Discontinued, Historical Med    rosuvastatin (CRESTOR) 10 MG tablet Take 10 mg by mouth daily., Until Discontinued, Historical Med    sevelamer carbonate (RENVELA) 800 MG tablet Take 2,400 mg by mouth 3 (three) times daily with meals. , Until Discontinued, Historical Med     tamsulosin (FLOMAX) 0.4 MG CAPS capsule Take 0.4 mg by mouth., Until Discontinued, Historical Med    traZODone (DESYREL) 50 MG tablet Take 50 mg by mouth at bedtime., Until Discontinued, Historical Med         Today    VITAL SIGNS:  Blood pressure 163/73, pulse 77, temperature 98 F (36.7 C), temperature source Oral, resp. rate 18, height 5\' 10"  (1.778 m), weight 81.557 kg (179 lb 12.8 oz), SpO2 94 %.  I/O:  No intake or output data in the 24 hours ending 07/26/15 2222  PHYSICAL EXAMINATION:  Physical Exam  GENERAL:  75 y.o.-year-old patient lying in the bed with no acute distress.  LUNGS: Normal breath sounds bilaterally, no wheezing, rales,rhonchi or crepitation. No use of accessory muscles of respiration.  CARDIOVASCULAR: S1, S2 normal. No murmurs, rubs, or gallops.  ABDOMEN: Soft, non-tender, non-distended. Bowel sounds present. No organomegaly or mass.  NEUROLOGIC: Moves all 4 extremities. PSYCHIATRIC: The patient is alert and oriented x 3.  SKIN: No obvious rash, lesion, or ulcer.   DATA REVIEW:   CBC  Recent Labs Lab 07/24/15 0458  WBC 6.1  HGB 10.4*  HCT 32.0*  PLT 170    Chemistries   Recent Labs Lab 07/21/15 2207  NA 142  K 3.9  CL 97*  CO2 37*  GLUCOSE 180*  BUN 13  CREATININE 3.69*  CALCIUM 8.1*    Cardiac Enzymes  Recent Labs Lab 07/22/15 1143  TROPONINI 1.33*    Microbiology Results  Results for orders placed or performed in visit on 03/29/13  Body fluid culture     Status: None   Collection Time: 03/29/13  4:05 PM  Result Value Ref Range Status   Micro Text Report   Final       SOURCE: R KNEE    ORGANISM 1                RARE COAGULASE NEGATIVE STAPHYLOCOCCUS   COMMENT                   CALL LAB IF SENSITIVITY TESTING REQUIRED   COMMENT                   NO ANAEROBES ISOLATED IN 4 DAYS   GRAM STAIN                NO ORGANISMS SEEN   GRAM STAIN                RARE WHITE BLOOD CELLS   ANTIBIOTIC                                                       Wound culture     Status: None   Collection Time: 03/29/13  4:12 PM  Result Value Ref  Range Status   Micro Text Report   Final       COMMENT                   NO GROWTH AEROBICALLY/ANAEROBICALLY IN 4 DAYS   GRAM STAIN                NO ORGANISMS SEEN   GRAM STAIN                RARE WHITE BLOOD CELLS   ANTIBIOTIC                                                      Wound culture     Status: None   Collection Time: 03/29/13  4:37 PM  Result Value Ref Range Status   Micro Text Report   Final       SOURCE: right knee notch    COMMENT                   NO GROWTH AEROBICALLY/ANAEROBICALLY IN 4 DAYS   GRAM STAIN                NO ORGANISMS SEEN   GRAM STAIN                MODERATE WHITE BLOOD CELLS   ANTIBIOTIC                                                        RADIOLOGY:  No results found.    Follow up with PCP in 1 week.  Management plans discussed with the patient, family and they are in agreement.  CODE STATUS:  Code Status History    Date Active Date Inactive Code Status Order ID Comments User Context   07/22/2015 12:02 AM 07/24/2015 10:25 PM Full Code [id number]  Harrie Foreman, MD ED      TOTAL TIME TAKING CARE OF THIS PATIENT ON DAY OF DISCHARGE: more than 30 minutes.    Hillary Bow R M.D on 07/26/2015 at 10:22 PM  Between 7am to 6pm - Pager - [phone]  After 6pm go to [email or web address] - password [email or web address] [facility]  Office  [phone]  CC: Primary care physician; Dion Body, MD     Note: This dictation was prepared with Dragon dictation along with smaller phrase technology. Any transcriptional errors that result from this process are unintentional.

## 2015-12-05 ENCOUNTER — Emergency Department: Payer: Medicare Other

## 2015-12-05 ENCOUNTER — Emergency Department
Admission: EM | Admit: 2015-12-05 | Discharge: 2015-12-05 | Disposition: A | Discharge status: Home / Self Care | Payer: Medicare Other | Attending: Emergency Medicine | Admitting: Emergency Medicine

## 2015-12-05 ENCOUNTER — Encounter: Payer: Self-pay | Admitting: Emergency Medicine

## 2015-12-05 DIAGNOSIS — Z7982 Long term (current) use of aspirin: Secondary | ICD-10-CM | POA: Diagnosis not present

## 2015-12-05 DIAGNOSIS — Y939 Activity, unspecified: Secondary | ICD-10-CM | POA: Diagnosis not present

## 2015-12-05 DIAGNOSIS — E785 Hyperlipidemia, unspecified: Secondary | ICD-10-CM | POA: Diagnosis not present

## 2015-12-05 DIAGNOSIS — Z8674 Personal history of sudden cardiac arrest: Secondary | ICD-10-CM | POA: Insufficient documentation

## 2015-12-05 DIAGNOSIS — I120 Hypertensive chronic kidney disease with stage 5 chronic kidney disease or end stage renal disease: Secondary | ICD-10-CM | POA: Insufficient documentation

## 2015-12-05 DIAGNOSIS — Y999 Unspecified external cause status: Secondary | ICD-10-CM | POA: Diagnosis not present

## 2015-12-05 DIAGNOSIS — S52515A Nondisplaced fracture of left radial styloid process, initial encounter for closed fracture: Secondary | ICD-10-CM | POA: Insufficient documentation

## 2015-12-05 DIAGNOSIS — M1990 Unspecified osteoarthritis, unspecified site: Secondary | ICD-10-CM | POA: Insufficient documentation

## 2015-12-05 DIAGNOSIS — Z955 Presence of coronary angioplasty implant and graft: Secondary | ICD-10-CM | POA: Diagnosis not present

## 2015-12-05 DIAGNOSIS — Z91018 Allergy to other foods: Secondary | ICD-10-CM | POA: Diagnosis not present

## 2015-12-05 DIAGNOSIS — S52512A Displaced fracture of left radial styloid process, initial encounter for closed fracture: Secondary | ICD-10-CM

## 2015-12-05 DIAGNOSIS — Z952 Presence of prosthetic heart valve: Secondary | ICD-10-CM | POA: Insufficient documentation

## 2015-12-05 DIAGNOSIS — Z8546 Personal history of malignant neoplasm of prostate: Secondary | ICD-10-CM | POA: Diagnosis not present

## 2015-12-05 DIAGNOSIS — Y929 Unspecified place or not applicable: Secondary | ICD-10-CM | POA: Diagnosis not present

## 2015-12-05 DIAGNOSIS — I252 Old myocardial infarction: Secondary | ICD-10-CM | POA: Diagnosis not present

## 2015-12-05 DIAGNOSIS — N186 End stage renal disease: Secondary | ICD-10-CM | POA: Insufficient documentation

## 2015-12-05 DIAGNOSIS — W01198A Fall on same level from slipping, tripping and stumbling with subsequent striking against other object, initial encounter: Secondary | ICD-10-CM | POA: Diagnosis not present

## 2015-12-05 DIAGNOSIS — S6992XA Unspecified injury of left wrist, hand and finger(s), initial encounter: Secondary | ICD-10-CM | POA: Diagnosis present

## 2015-12-05 DIAGNOSIS — S0990XA Unspecified injury of head, initial encounter: Secondary | ICD-10-CM | POA: Diagnosis not present

## 2015-12-05 DIAGNOSIS — Z87891 Personal history of nicotine dependence: Secondary | ICD-10-CM | POA: Diagnosis not present

## 2015-12-05 DIAGNOSIS — Z79899 Other long term (current) drug therapy: Secondary | ICD-10-CM | POA: Diagnosis not present

## 2015-12-05 DIAGNOSIS — W19XXXA Unspecified fall, initial encounter: Secondary | ICD-10-CM

## 2015-12-05 NOTE — ED Notes (Signed)
Patient transported to CT 

## 2015-12-05 NOTE — ED Provider Notes (Signed)
[facility] Emergency Department Provider Note  ____________________________________________  Time seen: 5:55 PM  I have reviewed the triage vital signs and the nursing notes.   HISTORY  Chief Complaint Fall    HPI Jimmy Carson is a 75 y.o. male reports tripping over a hose today and falling down. He broke his fall with his left wrist and tried to catch himself. He reports that his head hit the ground but just barely on the left forehead. He is on Plavix but denies headache or loss of consciousness. No vomiting. He is having pain in the left wrist although he has intact range of motion.     Past Medical History  Diagnosis Date  . ESRD (end stage renal disease) ([place])   . Polycystic kidney disease   . Heart attack ([place])   . Prostate CA ([place])   . History of radiation therapy     for prostate CA  . Anemia   . Hyperlipidemia   . CAD (coronary artery disease)     s/p percutaneous coronary angioplasty, s/p CABG x 6   . Hypertension   . Osteoarthritis   . Shingles   . Hyperparathyroidism, secondary renal [facility])      Patient Active Problem List   Diagnosis Date Noted  . NSTEMI (non-ST elevated myocardial infarction) ([place]) 07/22/2015  . Chest pain 07/21/2015     Past Surgical History  Procedure Laterality Date  . Coronary artery bypass graft    . Aortic valve replacement    . Replacement total knee bilateral    . Joint replacement    . Rotator cuff repair Right   . Cardiac catheterization N/A 07/24/2015    Procedure: Coronary/Graft Angiography;  Surgeon: Corey Skains, MD;  Location: [facility];  Service: Cardiovascular;  Laterality: N/A;     Current Outpatient Rx  Name  Route  Sig  Dispense  Refill  . acetaminophen (TYLENOL) 325 MG tablet   Oral   Take 650 mg by mouth every 6 (six) hours as needed.         Marland Kitchen aspirin EC 81 MG EC tablet   Oral   Take 1 tablet (81 mg total) by mouth daily.         . cholecalciferol  (VITAMIN D) 400 UNITS TABS tablet   Oral   Take 1,000 Units by mouth.         . cinacalcet (SENSIPAR) 90 MG tablet   Oral   Take 90 mg by mouth daily.         . clopidogrel (PLAVIX) 75 MG tablet   Oral   Take 75 mg by mouth daily.         Marland Kitchen gabapentin (NEURONTIN) 100 MG capsule   Oral   Take 100 mg by mouth 3 (three) times daily.         . isosorbide mononitrate (IMDUR) 60 MG 24 hr tablet   Oral   Take 60 mg by mouth daily.         . rosuvastatin (CRESTOR) 10 MG tablet   Oral   Take 10 mg by mouth daily.         . sevelamer carbonate (RENVELA) 800 MG tablet   Oral   Take 2,400 mg by mouth 3 (three) times daily with meals.          . tamsulosin (FLOMAX) 0.4 MG CAPS capsule   Oral   Take 0.4 mg by mouth.         Marland Kitchen  traZODone (DESYREL) 50 MG tablet   Oral   Take 50 mg by mouth at bedtime.            Allergies Orange fruit and Penicillins   Family History  Problem Relation Age of Onset  . Polycystic kidney disease Father     Social History Social History  Substance Use Topics  . Smoking status: Former Smoker    Quit date: 04/19/2013  . Smokeless tobacco: None  . Alcohol Use: No    Review of Systems  Constitutional:   No fever or chills.  Eyes:   No vision changes.  ENT:   No sore throat.No Nosebleed Cardiovascular:   No chest pain. Respiratory:   No dyspnea or cough. Gastrointestinal:   Negative for abdominal pain, vomiting and diarrhea.  Genitourinary:   Negative for dysuria or difficulty urinating. Musculoskeletal:   Left wrist pain. No back pain or neck pain Neurological:   Negative for headaches 10-point ROS otherwise negative.  ____________________________________________   PHYSICAL EXAM:  VITAL SIGNS: ED Triage Vitals  Enc Vitals Group     BP 12/05/15 1732 139/72 mmHg     Pulse Rate 12/05/15 1731 81     Resp 12/05/15 1731 16     Temp 12/05/15 1731 98.1 F (36.7 C)     Temp Source 12/05/15 1731 Oral     SpO2  12/05/15 1731 96 %     Weight 12/05/15 1731 175 lb (79.379 kg)     Height 12/05/15 1731 5\' 10"  (1.778 m)     Head Cir --      Peak Flow --      Pain Score 12/05/15 1732 6     Pain Loc --      Pain Edu? --      Excl. in [place]? --     Vital signs reviewed, nursing assessments reviewed.   Constitutional:   Alert and oriented. Well appearing and in no distress. Eyes:   No scleral icterus. No conjunctival pallor. PERRL. EOMI.  No nystagmus. ENT   Head:   Normocephalic and atraumatic.   Nose:   No congestion/rhinnorhea. No septal hematoma. No epistaxis   Mouth/Throat:   MMM, no pharyngeal erythema. No peritonsillar mass. No intraoral injuries   Neck:   No SubQ emphysema. No meningismus. Normal range of motion Hematological/Lymphatic/Immunilogical:   No cervical lymphadenopathy. Cardiovascular:   RRR. Symmetric bilateral radial and DP pulses.  No murmurs.  Respiratory:   Normal respiratory effort without tachypnea nor retractions. Breath sounds are clear and equal bilaterally. No wheezes/rales/rhonchi. Musculoskeletal:   Tenderness over the proximal fifth metacarpal of the left hand. No focal swelling or instability or crepitus. There is also point tenderness over the radial styloid. No snuffbox tenderness, no pain with axial loading of the left thumb. Patient has intact range of motion of the wrist and fingers. Neurologic:   Normal speech and language.  CN 2-10 normal. Motor grossly intact. No gross focal neurologic deficits are appreciated.  Skin:    Skin is warm, dry and intact. No rash noted.  No petechiae, purpura, or bullae. There is a small bruise noted to the left thenar eminence  ____________________________________________    LABS (pertinent positives/negatives) (all labs ordered are listed, but only abnormal results are displayed) Labs Reviewed - No data to  display ____________________________________________   EKG    ____________________________________________    RADIOLOGY  CT head unremarkable CT cervical spine unremarkable X-ray left wrist shows a nondisplaced fracture of the distal tip of  the radial styloid. Radiology report reviewed, imaging interpreted by myself as well. No identifiable fracture of the metacarpals.  ____________________________________________   PROCEDURES SPLINT APPLICATION Date/Time: 123XX123 PM Authorized by: Carrie Mew Consent: Verbal consent obtained. Risks and benefits: risks, benefits and alternatives were discussed Consent given by: patient Splint applied by: orthopedic technician Location details: Left wrist  Splint type: Thumb spica  Supplies used: Ortho-Glass  Post-procedure: The splinted body part was neurovascularly unchanged following the procedure. Patient tolerance: Patient tolerated the procedure well with no immediate complications.     ____________________________________________   INITIAL IMPRESSION / ASSESSMENT AND PLAN / ED COURSE  Pertinent labs & imaging results that were available during my care of the patient were reviewed by me and considered in my medical decision making (see chart for details).  Patient presents with mechanical fall. Found to have a nondisplaced closed fracture of the left radial styloid. No other identifiable injuries. Because of concern for an occult carpal fracture, the patient will have a thumb spica placed, follow up with orthopedics in a week. He has seen Dr. Marry Guan in the past and will call them for follow-up.     ____________________________________________   FINAL CLINICAL IMPRESSION(S) / ED DIAGNOSES  Final diagnoses:  Radial styloid fracture, left, closed, initial encounter       Portions of this note were generated with dragon dictation software. Dictation errors may occur despite best attempts at proofreading.   Carrie Mew, MD 12/05/15 [phone]

## 2015-12-05 NOTE — Discharge Instructions (Signed)
Cast or Splint Care °Casts and splints support injured limbs and keep bones from moving while they heal. It is important to care for your cast or splint at home.   °HOME CARE INSTRUCTIONS °· Keep the cast or splint uncovered during the drying period. It can take 24 to 48 hours to dry if it is made of plaster. A fiberglass cast will dry in less than 1 hour. °· Do not rest the cast on anything harder than a pillow for the first 24 hours. °· Do not put weight on your injured limb or apply pressure to the cast until your health care provider gives you permission. °· Keep the cast or splint dry. Wet casts or splints can lose their shape and may not support the limb as well. A wet cast that has lost its shape can also create harmful pressure on your skin when it dries. Also, wet skin can become infected. °· Cover the cast or splint with a plastic bag when bathing or when out in the rain or snow. If the cast is on the trunk of the body, take sponge baths until the cast is removed. °· If your cast does become wet, dry it with a towel or a blow dryer on the cool setting only. °· Keep your cast or splint clean. Soiled casts may be wiped with a moistened cloth. °· Do not place any hard or soft foreign objects under your cast or splint, such as cotton, toilet paper, lotion, or powder. °· Do not try to scratch the skin under the cast with any object. The object could get stuck inside the cast. Also, scratching could lead to an infection. If itching is a problem, use a blow dryer on a cool setting to relieve discomfort. °· Do not trim or cut your cast or remove padding from inside of it. °· Exercise all joints next to the injury that are not immobilized by the cast or splint. For example, if you have a long leg cast, exercise the hip joint and toes. If you have an arm cast or splint, exercise the shoulder, elbow, thumb, and fingers. °· Elevate your injured arm or leg on 1 or 2 pillows for the first 1 to 3 days to decrease  swelling and pain. It is best if you can comfortably elevate your cast so it is higher than your heart. °SEEK MEDICAL CARE IF:  °· Your cast or splint cracks. °· Your cast or splint is too tight or too loose. °· You have unbearable itching inside the cast. °· Your cast becomes wet or develops a soft spot or area. °· You have a bad smell coming from inside your cast. °· You get an object stuck under your cast. °· Your skin around the cast becomes red or raw. °· You have new pain or worsening pain after the cast has been applied. °SEEK IMMEDIATE MEDICAL CARE IF:  °· You have fluid leaking through the cast. °· You are unable to move your fingers or toes. °· You have discolored (blue or white), cool, painful, or very swollen fingers or toes beyond the cast. °· You have tingling or numbness around the injured area. °· You have severe pain or pressure under the cast. °· You have any difficulty with your breathing or have shortness of breath. °· You have chest pain. °  °This information is not intended to replace advice given to you by your health care provider. Make sure you discuss any questions you have with your health care   provider. °  °Document Released: 06/07/2000 Document Revised: 03/31/2013 Document Reviewed: 12/17/2012 °Elsevier Interactive Patient Education ©2016 [employer] ° °Forearm Fracture °A forearm fracture is a break in one or both of the bones of your arm that are between the elbow and the wrist. Your forearm is made up of two bones: °· Radius. This is the bone on the inside of your arm near your thumb. °· Ulna. This is the bone on the outside of your arm near your little finger. °Middle forearm fractures usually break both the radius and the ulna. Most forearm fractures that involve both the ulna and radius will require surgery. °CAUSES °Common causes of this type of fracture include: °· Falling on an outstretched arm. °· Accidents, such as a car or bike accident. °· A hard, direct hit to the middle  part of your arm. °RISK FACTORS °You may be at higher risk for this type of fracture if: °· You play contact sports. °· You have a condition that causes your bones to be weak or thin (osteoporosis). °SIGNS AND SYMPTOMS °A forearm fracture causes pain immediately after the injury. Other signs and symptoms include: °· An abnormal bend or bump in your arm (deformity). °· Swelling. °· Numbness or tingling. °· Tenderness. °· Inability to turn your hand from side to side (rotate). °· Bruising. °DIAGNOSIS °Your health care provider may diagnose a forearm fracture based on: °· Your symptoms. °· Your medical history, including any recent injury. °· A physical exam. Your health care provider will look for any deformity and feel for tenderness over the break. Your health care provider will also check whether the bones are out of place. °· An X-ray exam to confirm the diagnosis and learn more about the type of fracture. °TREATMENT °The goals of treatment are to get the bone or bones in proper position for healing and to keep the bones from moving so they will heal over time. Your treatment will depend on many factors, especially the type of fracture that you have. °· If the fractured bone or bones: °¨ Are in the correct position (nondisplaced), you may only need to wear a cast or a splint. °¨ Have a slightly displaced fracture, you may need to have the bones moved back into place manually (closed reduction) before the splint or cast is put on. °· You may have a temporary splint before you have a cast. The splint allows room for some swelling. After a few days, a cast can replace the splint. °· You may have to wear the cast for 6-8 weeks or as directed by your health care provider. °· The cast may be changed after about 3 weeks or as directed by your health care provider. °· After your cast is removed, you may need physical therapy to regain full movement in your wrist or elbow. °· You may need emergency surgery if you  have: °¨ A fractured bone or bones that are out of position (displaced). °¨ A fracture with multiple fragments (comminuted fracture). °¨ A fracture that breaks the skin (open fracture). This type of fracture may require surgical wires, plates, or screws to hold the bone or bones in place. °· You may have X-rays every couple of weeks to check on your healing. °HOME CARE INSTRUCTIONS °If You Have a Cast: °· Do not stick anything inside the cast to scratch your skin. Doing that increases your risk of infection. °· Check the skin around the cast every day. Report any concerns to your health care   provider. You may put lotion on dry skin around the edges of the cast. Do not apply lotion to the skin underneath the cast. °If You Have a Splint: °· Wear it as directed by your health care provider. Remove it only as directed by your health care provider. °· Loosen the splint if your fingers become numb and tingle, or if they turn cold and blue. °Bathing °· Cover the cast or splint with a watertight plastic bag to protect it from water while you bathe or shower. Do not let the cast or splint get wet. °Managing Pain, Stiffness, and Swelling °· If directed, apply ice to the injured area: °¨ Put ice in a plastic bag. °¨ Place a towel between your skin and the bag. °¨ Leave the ice on for 20 minutes, 2-3 times a day. °· Move your fingers often to avoid stiffness and to lessen swelling. °· Raise the injured area above the level of your heart while you are sitting or lying down. °Driving °· Do not drive or operate heavy machinery while taking pain medicine. °· Do not drive while wearing a cast or splint on a hand that you use for driving. °Activity °· Return to your normal activities as directed by your health care provider. Ask your health care provider what activities are safe for you. °· Perform range-of-motion exercises only as directed by your health care provider. °Safety °· Do not use your injured limb to support your body  weight until your health care provider says that you can. °General Instructions °· Do not put pressure on any part of the cast or splint until it is fully hardened. This may take several hours. °· Keep the cast or splint clean and dry. °· Do not use any tobacco products, including cigarettes, chewing tobacco, or electronic cigarettes. Tobacco can delay bone healing. If you need help quitting, ask your health care provider. °· Take medicines only as directed by your health care provider. °· Keep all follow-up visits as directed by your health care provider. This is important. °SEEK MEDICAL CARE IF: °· Your pain medicine is not helping. °· Your cast or splint becomes wet or damaged or suddenly feels too tight. °· Your cast becomes loose. °· You have more severe pain or swelling than you did before the cast. °· You have severe pain when you stretch your fingers. °· You continue to have pain or stiffness in your elbow or your wrist after your cast is removed. °SEEK IMMEDIATE MEDICAL CARE IF: °· You cannot move your fingers. °· You lose feeling in your fingers or your hand. °· Your hand or your fingers turn cold and pale or blue. °· You notice a bad smell coming from your cast. °· You have drainage from underneath your cast. °· You have new stains from blood or drainage that is coming through your cast. °  °This information is not intended to replace advice given to you by your health care provider. Make sure you discuss any questions you have with your health care provider. °  °Document Released: 06/07/2000 Document Revised: 07/01/2014 Document Reviewed: 01/24/2014 °Elsevier Interactive Patient Education ©2016 [employer] ° °

## 2015-12-05 NOTE — ED Notes (Signed)
Pt reports tripping on hose and falling; hit head, denies LOC. Pt also reports left wrist pain after injury. Pt is on plavix.

## 2016-01-12 DIAGNOSIS — M12811 Other specific arthropathies, not elsewhere classified, right shoulder: Secondary | ICD-10-CM | POA: Insufficient documentation

## 2016-01-12 DIAGNOSIS — M12812 Other specific arthropathies, not elsewhere classified, left shoulder: Secondary | ICD-10-CM | POA: Insufficient documentation

## 2016-01-12 DIAGNOSIS — M75122 Complete rotator cuff tear or rupture of left shoulder, not specified as traumatic: Secondary | ICD-10-CM | POA: Insufficient documentation

## 2016-02-06 ENCOUNTER — Other Ambulatory Visit: Payer: Self-pay | Admitting: Family Medicine

## 2016-02-06 DIAGNOSIS — G459 Transient cerebral ischemic attack, unspecified: Secondary | ICD-10-CM

## 2016-02-20 ENCOUNTER — Ambulatory Visit: Payer: TRICARE For Life (TFL)

## 2016-02-27 ENCOUNTER — Ambulatory Visit
Admission: RE | Admit: 2016-02-27 | Discharge: 2016-02-27 | Disposition: A | Discharge status: Home / Self Care | Payer: Medicare Other | Source: Ambulatory Visit | Attending: Family Medicine | Admitting: Family Medicine

## 2016-02-27 DIAGNOSIS — G459 Transient cerebral ischemic attack, unspecified: Secondary | ICD-10-CM | POA: Diagnosis present

## 2016-02-27 DIAGNOSIS — J320 Chronic maxillary sinusitis: Secondary | ICD-10-CM | POA: Diagnosis not present

## 2016-02-28 ENCOUNTER — Other Ambulatory Visit: Payer: Self-pay | Admitting: Family Medicine

## 2016-02-28 DIAGNOSIS — R930 Abnormal findings on diagnostic imaging of skull and head, not elsewhere classified: Secondary | ICD-10-CM

## 2016-03-04 ENCOUNTER — Ambulatory Visit
Admission: RE | Admit: 2016-03-04 | Discharge: 2016-03-04 | Disposition: A | Discharge status: Home / Self Care | Payer: Medicare Other | Source: Ambulatory Visit | Attending: Family Medicine | Admitting: Family Medicine

## 2016-03-04 DIAGNOSIS — G319 Degenerative disease of nervous system, unspecified: Secondary | ICD-10-CM | POA: Insufficient documentation

## 2016-03-04 DIAGNOSIS — I672 Cerebral atherosclerosis: Secondary | ICD-10-CM | POA: Insufficient documentation

## 2016-03-04 DIAGNOSIS — R930 Abnormal findings on diagnostic imaging of skull and head, not elsewhere classified: Secondary | ICD-10-CM | POA: Insufficient documentation

## 2016-03-04 DIAGNOSIS — I708 Atherosclerosis of other arteries: Secondary | ICD-10-CM | POA: Diagnosis not present

## 2016-03-04 HISTORY — DX: Disorder of kidney and ureter, unspecified: N28.9

## 2016-03-04 MED ORDER — IOPAMIDOL (ISOVUE-370) INJECTION 76%
75.0000 mL | Freq: Once | INTRAVENOUS | Status: AC | PRN
  Administered 2016-03-04: 75 mL via INTRAVENOUS

## 2016-03-12 ENCOUNTER — Ambulatory Visit: Payer: Medicare Other

## 2016-04-24 ENCOUNTER — Emergency Department
Admission: EM | Admit: 2016-04-24 | Discharge: 2016-04-24 | Disposition: A | Discharge status: Home / Self Care | Payer: Medicare Other | Attending: Emergency Medicine | Admitting: Emergency Medicine

## 2016-04-24 ENCOUNTER — Encounter: Payer: Self-pay | Admitting: *Deleted

## 2016-04-24 ENCOUNTER — Emergency Department: Payer: Medicare Other

## 2016-04-24 DIAGNOSIS — Z79899 Other long term (current) drug therapy: Secondary | ICD-10-CM | POA: Diagnosis not present

## 2016-04-24 DIAGNOSIS — Z7982 Long term (current) use of aspirin: Secondary | ICD-10-CM | POA: Insufficient documentation

## 2016-04-24 DIAGNOSIS — I2510 Atherosclerotic heart disease of native coronary artery without angina pectoris: Secondary | ICD-10-CM | POA: Diagnosis not present

## 2016-04-24 DIAGNOSIS — Z87891 Personal history of nicotine dependence: Secondary | ICD-10-CM | POA: Diagnosis not present

## 2016-04-24 DIAGNOSIS — Z992 Dependence on renal dialysis: Secondary | ICD-10-CM | POA: Diagnosis not present

## 2016-04-24 DIAGNOSIS — I120 Hypertensive chronic kidney disease with stage 5 chronic kidney disease or end stage renal disease: Secondary | ICD-10-CM | POA: Diagnosis not present

## 2016-04-24 DIAGNOSIS — R202 Paresthesia of skin: Secondary | ICD-10-CM | POA: Diagnosis not present

## 2016-04-24 DIAGNOSIS — R630 Anorexia: Secondary | ICD-10-CM | POA: Insufficient documentation

## 2016-04-24 DIAGNOSIS — Z951 Presence of aortocoronary bypass graft: Secondary | ICD-10-CM | POA: Diagnosis not present

## 2016-04-24 DIAGNOSIS — Z8546 Personal history of malignant neoplasm of prostate: Secondary | ICD-10-CM | POA: Insufficient documentation

## 2016-04-24 DIAGNOSIS — Z952 Presence of prosthetic heart valve: Secondary | ICD-10-CM | POA: Diagnosis not present

## 2016-04-24 DIAGNOSIS — N186 End stage renal disease: Secondary | ICD-10-CM | POA: Insufficient documentation

## 2016-04-24 DIAGNOSIS — R200 Anesthesia of skin: Secondary | ICD-10-CM | POA: Diagnosis present

## 2016-04-24 DIAGNOSIS — Z791 Long term (current) use of non-steroidal anti-inflammatories (NSAID): Secondary | ICD-10-CM | POA: Diagnosis not present

## 2016-04-24 LAB — CBC
HEMATOCRIT: 37 % — AB (ref 40.0–52.0)
HEMOGLOBIN: 12.1 g/dL — AB (ref 13.0–18.0)
MCH: 28.9 pg (ref 26.0–34.0)
MCHC: 32.6 g/dL (ref 32.0–36.0)
MCV: 88.7 fL (ref 80.0–100.0)
Platelets: 171 10*3/uL (ref 150–440)
RBC: 4.17 MIL/uL — ABNORMAL LOW (ref 4.40–5.90)
RDW: 17.5 % — AB (ref 11.5–14.5)
WBC: 5.3 10*3/uL (ref 3.8–10.6)

## 2016-04-24 LAB — BASIC METABOLIC PANEL
ANION GAP: 9 (ref 5–15)
BUN: 10 mg/dL (ref 6–20)
CHLORIDE: 98 mmol/L — AB (ref 101–111)
CO2: 34 mmol/L — ABNORMAL HIGH (ref 22–32)
Calcium: 8.6 mg/dL — ABNORMAL LOW (ref 8.9–10.3)
Creatinine, Ser: 2.97 mg/dL — ABNORMAL HIGH (ref 0.61–1.24)
GFR calc Af Amer: 23 mL/min — ABNORMAL LOW (ref >60)
GFR calc non Af Amer: 19 mL/min — ABNORMAL LOW (ref >60)
GLUCOSE: 92 mg/dL (ref 65–99)
POTASSIUM: 3.5 mmol/L (ref 3.5–5.1)
Sodium: 141 mmol/L (ref 135–145)

## 2016-04-24 LAB — TROPONIN I: Troponin I: <0.03 ng/mL (ref <0.03)

## 2016-04-24 NOTE — ED Triage Notes (Addendum)
Pt to triage via wheelchair.  Pt reports numbness in right side of face for 1 week.  Pt had recent mri and ctscan.  Pt reports diff walking.  Speech clear.  Pt alert.

## 2016-04-24 NOTE — ED Notes (Signed)
Pt in via triage with complaints of numbness to right side of face x approximately one week.  Pt states that numbness occurs with certain positions of the neck/head.  Pt has seen PCP and neurosurgeon recently w/ MRI/CT completed; neurosurgeon states numbness is likely due to pinched nerve.  Pt also reports generalized weakness/fatigue and decrease in appetite over the last week.  Pt A/Ox4, vitals WDL, no immediate distress at this time.

## 2016-04-24 NOTE — Discharge Instructions (Signed)
Please seek medical attention for any high fevers, chest pain, shortness of breath, change in behavior, persistent vomiting, bloody stool or any other new or concerning symptoms.  

## 2016-04-24 NOTE — ED Provider Notes (Signed)
[facility] Emergency Department Provider Note    ____________________________________________   I have reviewed the triage vital signs and the nursing notes.   HISTORY  Chief Complaint Numbness   History limited by: Not Limited   HPI Jimmy Carson is a 75 y.o. male who presents to the emergency department at the recommendation of one of his dialysis nurses. He had been discussing with his nurse that he has been having intermittent right sided facial numbness, and has been feeling weak. The numbness has been going on for a number of weeks. He describes it as tingling. It is dependent on how he is holding his head. Last month the patient underwent an MRI and CT angio of his brain after having some issues with his right eye. Since then he has followed up with a neurosurgeon because of concern for possible aneurysm. The neurosurgeon did not think the imaging represented an aneurysm. Furthermore the patient did discuss with the neurosurgeon his occasional face numbness. The neurosurgeon did not think it represented a stroke. In addition to the occasional facial numbness the patient has also felt weak this past week and has had a decreased appetite. Denies any fever, chest pain, shortness of breath.   Past Medical History:  Diagnosis Date  . Anemia   . CAD (coronary artery disease)    s/p percutaneous coronary angioplasty, s/p CABG x 6   . ESRD (end stage renal disease) ([place])   . Heart attack   . History of radiation therapy    for prostate CA  . Hyperlipidemia   . Hyperparathyroidism, secondary renal ([place])   . Hypertension   . Osteoarthritis   . Polycystic kidney disease   . Prostate CA [facility]) 2012   Brachytherapy  . Renal insufficiency    currently on dialysis  . Shingles     Patient Active Problem List   Diagnosis Date Noted  . NSTEMI (non-ST elevated myocardial infarction) ([place]) 07/22/2015  . Chest pain 07/21/2015    Past Surgical History:   Procedure Laterality Date  . AORTIC VALVE REPLACEMENT    . CARDIAC CATHETERIZATION N/A 07/24/2015   Procedure: Coronary/Graft Angiography;  Surgeon: Corey Skains, MD;  Location: [facility];  Service: Cardiovascular;  Laterality: N/A;  . CORONARY ARTERY BYPASS GRAFT    . JOINT REPLACEMENT    . REPLACEMENT TOTAL KNEE BILATERAL    . ROTATOR CUFF REPAIR Right     Prior to Admission medications   Medication Sig Start Date End Date Taking? Authorizing Provider  acetaminophen (TYLENOL) 325 MG tablet Take 650 mg by mouth every 6 (six) hours as needed.    Historical Provider, MD  aspirin EC 81 MG EC tablet Take 1 tablet (81 mg total) by mouth daily. 07/24/15   Srikar Sudini, MD  cholecalciferol (VITAMIN D) 400 UNITS TABS tablet Take 1,000 Units by mouth.    Historical Provider, MD  cinacalcet (SENSIPAR) 90 MG tablet Take 90 mg by mouth daily.    Historical Provider, MD  clopidogrel (PLAVIX) 75 MG tablet Take 75 mg by mouth daily.    Historical Provider, MD  gabapentin (NEURONTIN) 100 MG capsule Take 100 mg by mouth 3 (three) times daily.    Historical Provider, MD  isosorbide mononitrate (IMDUR) 60 MG 24 hr tablet Take 60 mg by mouth daily.    Historical Provider, MD  rosuvastatin (CRESTOR) 10 MG tablet Take 10 mg by mouth daily.    Historical Provider, MD  sevelamer carbonate (RENVELA) 800 MG tablet  Take 2,400 mg by mouth 3 (three) times daily with meals.     Historical Provider, MD  tamsulosin (FLOMAX) 0.4 MG CAPS capsule Take 0.4 mg by mouth.    Historical Provider, MD  traZODone (DESYREL) 50 MG tablet Take 50 mg by mouth at bedtime.    Historical Provider, MD    Allergies Orange fruit [citrus] and Penicillins  Family History  Problem Relation Age of Onset  . Polycystic kidney disease Father     Social History Social History  Substance Use Topics  . Smoking status: Former Smoker    Quit date: 04/19/2013  . Smokeless tobacco: Never Used  . Alcohol use No    Review of  Systems  Constitutional: Negative for fever. Cardiovascular: Negative for chest pain. Respiratory: Negative for shortness of breath. Gastrointestinal: Negative for abdominal pain, vomiting and diarrhea. Genitourinary: Negative for dysuria. Musculoskeletal: Negative for back pain. Skin: Negative for rash. Neurological: Positive for occasional right sided facial tingling.   10-point ROS otherwise negative.  ____________________________________________   PHYSICAL EXAM:  VITAL SIGNS: ED Triage Vitals  Enc Vitals Group     BP 04/24/16 1629 (!) 172/73     Pulse Rate 04/24/16 1629 74     Resp 04/24/16 1629 20     Temp 04/24/16 1718 97.8 F (36.6 C)     Temp Source 04/24/16 1629 Oral     SpO2 04/24/16 1629 99 %     Weight 04/24/16 1630 175 lb (79.4 kg)     Height 04/24/16 1630 5\' 10"  (1.778 m)     Head Circumference --      Peak Flow --      Pain Score 04/24/16 1631 2   Constitutional: Alert and oriented. Well appearing and in no distress. Eyes: Conjunctivae are normal. Normal extraocular movements. ENT   Head: Normocephalic and atraumatic.   Nose: No congestion/rhinnorhea.   Mouth/Throat: Mucous membranes are moist.   Neck: No stridor. Hematological/Lymphatic/Immunilogical: No cervical lymphadenopathy. Cardiovascular: Normal rate, regular rhythm.  No murmurs, rubs, or gallops.  Respiratory: Normal respiratory effort without tachypnea nor retractions. Breath sounds are clear and equal bilaterally. No wheezes/rales/rhonchi. Gastrointestinal: Soft and nontender. No distention.  Genitourinary: Deferred Musculoskeletal: Normal range of motion in all extremities. No lower extremity edema. Neurologic:  Normal speech and language. Face symmetric. Tongue midline. Strength 5/5 in upper and lower extremities. Sensation grossly intact.  No gross focal neurologic deficits are appreciated.  Skin:  Skin is warm, dry and intact. No rash noted. Psychiatric: Mood and affect are  normal. Speech and behavior are normal. Patient exhibits appropriate insight and judgment.  ____________________________________________    LABS (pertinent positives/negatives)  Labs Reviewed  BASIC METABOLIC PANEL - Abnormal; Notable for the following:       Result Value   Chloride 98 (*)    CO2 34 (*)    Creatinine, Ser 2.97 (*)    Calcium 8.6 (*)    GFR calc non Af Amer 19 (*)    GFR calc Af Amer 23 (*)    All other components within normal limits  CBC - Abnormal; Notable for the following:    RBC 4.17 (*)    Hemoglobin 12.1 (*)    HCT 37.0 (*)    RDW 17.5 (*)    All other components within normal limits  TROPONIN I     ____________________________________________   EKG  I, Nance Pear, attending physician, personally viewed and interpreted this EKG  EKG Time: 1637 Rate: 72 Rhythm: normal sinus rhythm  Axis: left axis deviation Intervals: qtc 492 QRS: LBBB ST changes: no st elevation equivalent Impression: abnormal ekg   ____________________________________________    RADIOLOGY  CT head IMPRESSION:  Atrophy and small vessel disease. Atherosclerotic calcification and  dolichoectasia.    Stable appearance from priors. No acute intracranial findings.    ___________________________________________   PROCEDURES  Procedures  ____________________________________________   INITIAL IMPRESSION / ASSESSMENT AND PLAN / ED COURSE  Pertinent labs & imaging results that were available during my care of the patient were reviewed by me and considered in my medical decision making (see chart for details).  Patient here with occasional right sided facial tingling. It is positional. The patient ct head without any acute findings and no neurologic deficit on exam. At this point I do not think patient's symptoms represent a stroke.  Think likely paresthesia from possible pinched nerve given positional nature of the symptoms. Patient's blood work with mildly  decreased calcium however appears to be patient's baseline. Feel patient is safe for discharge. Will have patient follow up with PCP. ____________________________________________   FINAL CLINICAL IMPRESSION(S) / ED DIAGNOSES  Final diagnoses:  Paresthesia     Note: This dictation was prepared with Dragon dictation. Any transcriptional errors that result from this process are unintentional    Nance Pear, MD 04/24/16 1759

## 2016-05-08 DIAGNOSIS — T82898A Other specified complication of vascular prosthetic devices, implants and grafts, initial encounter: Secondary | ICD-10-CM | POA: Insufficient documentation

## 2016-05-31 ENCOUNTER — Inpatient Hospital Stay
Admission: EM | Admit: 2016-05-31 | Discharge: 2016-06-03 | DRG: 280 | Disposition: A | Discharge status: Home / Self Care | Payer: Medicare Other | Attending: Internal Medicine | Admitting: Internal Medicine

## 2016-05-31 ENCOUNTER — Emergency Department: Payer: Medicare Other

## 2016-05-31 DIAGNOSIS — Z841 Family history of disorders of kidney and ureter: Secondary | ICD-10-CM

## 2016-05-31 DIAGNOSIS — I120 Hypertensive chronic kidney disease with stage 5 chronic kidney disease or end stage renal disease: Secondary | ICD-10-CM | POA: Diagnosis present

## 2016-05-31 DIAGNOSIS — Z87891 Personal history of nicotine dependence: Secondary | ICD-10-CM

## 2016-05-31 DIAGNOSIS — I252 Old myocardial infarction: Secondary | ICD-10-CM

## 2016-05-31 DIAGNOSIS — I2510 Atherosclerotic heart disease of native coronary artery without angina pectoris: Secondary | ICD-10-CM | POA: Diagnosis present

## 2016-05-31 DIAGNOSIS — N186 End stage renal disease: Secondary | ICD-10-CM | POA: Diagnosis present

## 2016-05-31 DIAGNOSIS — E039 Hypothyroidism, unspecified: Secondary | ICD-10-CM | POA: Diagnosis present

## 2016-05-31 DIAGNOSIS — N2581 Secondary hyperparathyroidism of renal origin: Secondary | ICD-10-CM | POA: Diagnosis present

## 2016-05-31 DIAGNOSIS — Z96653 Presence of artificial knee joint, bilateral: Secondary | ICD-10-CM | POA: Diagnosis present

## 2016-05-31 DIAGNOSIS — Z951 Presence of aortocoronary bypass graft: Secondary | ICD-10-CM

## 2016-05-31 DIAGNOSIS — Q612 Polycystic kidney, adult type: Secondary | ICD-10-CM

## 2016-05-31 DIAGNOSIS — N400 Enlarged prostate without lower urinary tract symptoms: Secondary | ICD-10-CM | POA: Diagnosis present

## 2016-05-31 DIAGNOSIS — Z880 Allergy status to penicillin: Secondary | ICD-10-CM

## 2016-05-31 DIAGNOSIS — I214 Non-ST elevation (NSTEMI) myocardial infarction: Principal | ICD-10-CM | POA: Diagnosis present

## 2016-05-31 DIAGNOSIS — Z8546 Personal history of malignant neoplasm of prostate: Secondary | ICD-10-CM

## 2016-05-31 DIAGNOSIS — R079 Chest pain, unspecified: Secondary | ICD-10-CM | POA: Diagnosis present

## 2016-05-31 DIAGNOSIS — Z91018 Allergy to other foods: Secondary | ICD-10-CM

## 2016-05-31 DIAGNOSIS — Z952 Presence of prosthetic heart valve: Secondary | ICD-10-CM

## 2016-05-31 DIAGNOSIS — Z8271 Family history of polycystic kidney: Secondary | ICD-10-CM

## 2016-05-31 DIAGNOSIS — D631 Anemia in chronic kidney disease: Secondary | ICD-10-CM | POA: Diagnosis present

## 2016-05-31 DIAGNOSIS — E785 Hyperlipidemia, unspecified: Secondary | ICD-10-CM | POA: Diagnosis present

## 2016-05-31 DIAGNOSIS — Z923 Personal history of irradiation: Secondary | ICD-10-CM

## 2016-05-31 DIAGNOSIS — I447 Left bundle-branch block, unspecified: Secondary | ICD-10-CM | POA: Diagnosis present

## 2016-05-31 DIAGNOSIS — Z992 Dependence on renal dialysis: Secondary | ICD-10-CM

## 2016-05-31 DIAGNOSIS — M1990 Unspecified osteoarthritis, unspecified site: Secondary | ICD-10-CM | POA: Diagnosis present

## 2016-05-31 DIAGNOSIS — I97618 Postprocedural hemorrhage and hematoma of a circulatory system organ or structure following other circulatory system procedure: Secondary | ICD-10-CM | POA: Diagnosis present

## 2016-05-31 DIAGNOSIS — Z955 Presence of coronary angioplasty implant and graft: Secondary | ICD-10-CM

## 2016-05-31 DIAGNOSIS — Z7902 Long term (current) use of antithrombotics/antiplatelets: Secondary | ICD-10-CM

## 2016-05-31 DIAGNOSIS — J9811 Atelectasis: Secondary | ICD-10-CM | POA: Diagnosis present

## 2016-05-31 LAB — CBC
HEMATOCRIT: 28.6 % — AB (ref 40.0–52.0)
HEMOGLOBIN: 9.4 g/dL — AB (ref 13.0–18.0)
MCH: 29.2 pg (ref 26.0–34.0)
MCHC: 32.9 g/dL (ref 32.0–36.0)
MCV: 88.7 fL (ref 80.0–100.0)
Platelets: 127 10*3/uL — ABNORMAL LOW (ref 150–440)
RBC: 3.22 MIL/uL — AB (ref 4.40–5.90)
RDW: 17 % — ABNORMAL HIGH (ref 11.5–14.5)
WBC: 6.4 10*3/uL (ref 3.8–10.6)

## 2016-05-31 LAB — COMPREHENSIVE METABOLIC PANEL
ALBUMIN: 3.2 g/dL — AB (ref 3.5–5.0)
ALK PHOS: 87 U/L (ref 38–126)
ALT: 6 U/L — AB (ref 17–63)
AST: 13 U/L — AB (ref 15–41)
Anion gap: 7 (ref 5–15)
BILIRUBIN TOTAL: 0.7 mg/dL (ref 0.3–1.2)
BUN: 19 mg/dL (ref 6–20)
CALCIUM: 8.2 mg/dL — AB (ref 8.9–10.3)
CO2: 33 mmol/L — ABNORMAL HIGH (ref 22–32)
Chloride: 99 mmol/L — ABNORMAL LOW (ref 101–111)
Creatinine, Ser: 2.85 mg/dL — ABNORMAL HIGH (ref 0.61–1.24)
GFR calc Af Amer: 24 mL/min — ABNORMAL LOW (ref >60)
GFR calc non Af Amer: 20 mL/min — ABNORMAL LOW (ref >60)
GLUCOSE: 159 mg/dL — AB (ref 65–99)
POTASSIUM: 3.7 mmol/L (ref 3.5–5.1)
Sodium: 139 mmol/L (ref 135–145)
TOTAL PROTEIN: 6.4 g/dL — AB (ref 6.5–8.1)

## 2016-05-31 LAB — TROPONIN I
TROPONIN I: 0.11 ng/mL — AB (ref <0.03)
Troponin I: <0.03 ng/mL (ref <0.03)

## 2016-05-31 MED ORDER — SODIUM CHLORIDE 0.9% FLUSH
3.0000 mL | Freq: Two times a day (BID) | INTRAVENOUS | Status: DC
  Administered 2016-05-31 – 2016-06-03 (×3): 3 mL via INTRAVENOUS

## 2016-05-31 MED ORDER — VITAMIN D 1000 UNITS PO TABS
1000.0000 [IU] | ORAL_TABLET | Freq: Every day | ORAL | Status: DC
  Administered 2016-05-31: 1000 [IU] via ORAL
  Filled 2016-05-31: qty 1

## 2016-05-31 MED ORDER — TAMSULOSIN HCL 0.4 MG PO CAPS
0.4000 mg | ORAL_CAPSULE | Freq: Every day | ORAL | Status: DC
  Administered 2016-05-31: 0.4 mg via ORAL
  Filled 2016-05-31: qty 1

## 2016-05-31 MED ORDER — SEVELAMER CARBONATE 800 MG PO TABS: 2400.0000 mg | ORAL_TABLET | Freq: Three times a day (TID) | ORAL | Status: DC

## 2016-05-31 MED ORDER — ACETAMINOPHEN 325 MG PO TABS
650.0000 mg | ORAL_TABLET | Freq: Four times a day (QID) | ORAL | Status: DC | PRN
  Administered 2016-05-31 – 2016-06-02 (×4): 650 mg via ORAL
  Filled 2016-05-31 (×4): qty 2

## 2016-05-31 MED ORDER — CLOPIDOGREL BISULFATE 75 MG PO TABS
75.0000 mg | ORAL_TABLET | Freq: Every day | ORAL | Status: DC
  Administered 2016-06-01 – 2016-06-02 (×2): 75 mg via ORAL
  Filled 2016-05-31 (×2): qty 1

## 2016-05-31 MED ORDER — ENOXAPARIN SODIUM 30 MG/0.3ML ~~LOC~~ SOLN: 30.0000 mg | SUBCUTANEOUS | Status: DC

## 2016-05-31 MED ORDER — ROSUVASTATIN CALCIUM 10 MG PO TABS
10.0000 mg | ORAL_TABLET | Freq: Every day | ORAL | Status: DC
  Administered 2016-05-31: 10 mg via ORAL
  Filled 2016-05-31: qty 1

## 2016-05-31 MED ORDER — ONDANSETRON HCL 4 MG/2ML IJ SOLN: 4.0000 mg | Freq: Four times a day (QID) | INTRAMUSCULAR | Status: DC | PRN

## 2016-05-31 MED ORDER — ISOSORBIDE MONONITRATE ER 60 MG PO TB24
60.0000 mg | ORAL_TABLET | Freq: Every day | ORAL | Status: DC
  Administered 2016-06-01 – 2016-06-02 (×2): 60 mg via ORAL
  Filled 2016-05-31 (×2): qty 1

## 2016-05-31 MED ORDER — NITROGLYCERIN 0.4 MG SL SUBL: 0.4000 mg | SUBLINGUAL_TABLET | SUBLINGUAL | Status: DC | PRN

## 2016-05-31 MED ORDER — CINACALCET HCL 30 MG PO TABS
90.0000 mg | ORAL_TABLET | Freq: Every day | ORAL | Status: DC
  Administered 2016-06-01 – 2016-06-02 (×2): 90 mg via ORAL
  Filled 2016-05-31 (×2): qty 3

## 2016-05-31 MED ORDER — ISOSORBIDE MONONITRATE ER 60 MG PO TB24
60.0000 mg | ORAL_TABLET | Freq: Every day | ORAL | Status: DC
  Administered 2016-05-31: 60 mg via ORAL
  Filled 2016-05-31: qty 1

## 2016-05-31 MED ORDER — ROSUVASTATIN CALCIUM 10 MG PO TABS
10.0000 mg | ORAL_TABLET | Freq: Every day | ORAL | Status: DC
  Administered 2016-06-01 – 2016-06-02 (×2): 10 mg via ORAL
  Filled 2016-05-31 (×2): qty 1

## 2016-05-31 MED ORDER — VITAMIN D 1000 UNITS PO TABS
1000.0000 [IU] | ORAL_TABLET | Freq: Every day | ORAL | Status: DC
  Administered 2016-06-01 – 2016-06-02 (×2): 1000 [IU] via ORAL
  Filled 2016-05-31 (×2): qty 1

## 2016-05-31 MED ORDER — CLOPIDOGREL BISULFATE 75 MG PO TABS
75.0000 mg | ORAL_TABLET | Freq: Every day | ORAL | Status: DC
  Administered 2016-05-31: 75 mg via ORAL
  Filled 2016-05-31: qty 1

## 2016-05-31 MED ORDER — METOPROLOL TARTRATE 25 MG PO TABS
12.5000 mg | ORAL_TABLET | Freq: Two times a day (BID) | ORAL | Status: DC
  Administered 2016-06-01 – 2016-06-03 (×6): 12.5 mg via ORAL
  Filled 2016-05-31 (×6): qty 1

## 2016-05-31 MED ORDER — TRAZODONE HCL 50 MG PO TABS
50.0000 mg | ORAL_TABLET | Freq: Every day | ORAL | Status: DC
  Administered 2016-05-31 – 2016-06-02 (×3): 50 mg via ORAL
  Filled 2016-05-31 (×3): qty 1

## 2016-05-31 MED ORDER — ACETAMINOPHEN 650 MG RE SUPP: 650.0000 mg | Freq: Four times a day (QID) | RECTAL | Status: DC | PRN

## 2016-05-31 MED ORDER — ONDANSETRON HCL 4 MG PO TABS: 4.0000 mg | ORAL_TABLET | Freq: Four times a day (QID) | ORAL | Status: DC | PRN

## 2016-05-31 MED ORDER — TAMSULOSIN HCL 0.4 MG PO CAPS
0.4000 mg | ORAL_CAPSULE | Freq: Every day | ORAL | Status: DC
  Administered 2016-06-01 – 2016-06-02 (×2): 0.4 mg via ORAL
  Filled 2016-05-31 (×2): qty 1

## 2016-05-31 MED ORDER — MORPHINE SULFATE (PF) 4 MG/ML IV SOLN: 2.0000 mg | INTRAVENOUS | Status: DC | PRN

## 2016-05-31 MED ORDER — SEVELAMER CARBONATE 800 MG PO TABS
2400.0000 mg | ORAL_TABLET | Freq: Three times a day (TID) | ORAL | Status: DC
  Administered 2016-05-31 – 2016-06-03 (×6): 2400 mg via ORAL
  Filled 2016-05-31 (×9): qty 3

## 2016-05-31 NOTE — Progress Notes (Signed)
Patient ID: Jimmy Carson, male   DOB: 03-Jun-1941, 75 y.o.   MRN: [id number]   Called for positive troponin #2 0.11 (troponin #1 0.03). Patient already on aspirin, statin, nitrate.  Will add heparin and metoprolol.  Cardiology consult requested.

## 2016-05-31 NOTE — Progress Notes (Signed)
Anticoagulation monitoring(Lovenox):  75 yo  male ordered Lovenox 40 mg Q24h  Filed Weights   05/31/16 1804  Weight: 178 lb (80.7 kg)   BMI    Lab Results  Component Value Date   CREATININE 2.85 (H) 05/31/2016   CREATININE 2.97 (H) 04/24/2016   CREATININE 3.69 (H) 07/21/2015   Estimated Creatinine Clearance: 23.8 mL/min (by C-G formula based on SCr of 2.85 mg/dL (H)). Hemoglobin & Hematocrit     Component Value Date/Time   HGB 9.4 (L) 05/31/2016 1815   HGB 9.0 (L) 10/05/2014 1757   HCT 28.6 (L) 05/31/2016 1815   HCT 29.4 (L) 10/05/2014 1757     Per Protocol for Patient with estCrcl < 30 ml/min and BMI < 40, will transition to Lovenox 30 mg Q24h.

## 2016-05-31 NOTE — H&P (Signed)
[facility] [place] NAME: Jimmy Carson    MR#:  [id number]  DATE OF BIRTH:  12-31-1940  DATE OF ADMISSION:  05/31/2016  PRIMARY CARE PHYSICIAN: Dion Body, MD   REQUESTING/REFERRING PHYSICIAN: Dr. Lenise Arena  CHIEF COMPLAINT:   Chief Complaint  Patient presents with  . Chest Pain    HISTORY OF PRESENT ILLNESS:  Jimmy Carson  is a 75 y.o. male with a known history of Coronary artery disease status post angioplasty, end-stage renal disease and hemodialysis, polycystic kidney disease, secondary hyperparathyroidism, essential hypertension, osteoarthritis, history of prostate cancer who presents to the hospital complaining of chest pain. Patient says he developed some chest pain earlier today. Normally he is able to rest and his chest pain subsides but it continued to persist and he needed to take his nitroglycerin spray to subside his chest pain. He was therefore bit concerned and therefore came to the ER for further evaluation. Patient's chest pain started shortly after he finished his hemodialysis session today and lasted about a total of an hour. He's had no further episodes of chest pain since then. He denies any shortness of breath, diaphoresis, palpitations, nausea, vomiting or any other associated symptoms associated with this chest pain.  PAST MEDICAL HISTORY:   Past Medical History:  Diagnosis Date  . Anemia   . CAD (coronary artery disease)    s/p percutaneous coronary angioplasty, s/p CABG x 6   . ESRD (end stage renal disease) ([place])   . Heart attack   . History of radiation therapy    for prostate CA  . Hyperlipidemia   . Hyperparathyroidism, secondary renal ([place])   . Hypertension   . Osteoarthritis   . Polycystic kidney disease   . Prostate CA [facility]) 2012   Brachytherapy  . Renal insufficiency    currently on dialysis  . Shingles     PAST SURGICAL HISTORY:   Past Surgical History:   Procedure Laterality Date  . AORTIC VALVE REPLACEMENT    . CARDIAC CATHETERIZATION N/A 07/24/2015   Procedure: Coronary/Graft Angiography;  Surgeon: Corey Skains, MD;  Location: [place] [facility];  Service: Cardiovascular;  Laterality: N/A;  . CORONARY ARTERY BYPASS GRAFT    . JOINT REPLACEMENT    . REPLACEMENT TOTAL KNEE BILATERAL    . ROTATOR CUFF REPAIR Right     SOCIAL HISTORY:   Social History  Substance Use Topics  . Smoking status: Former Smoker    Quit date: 04/19/2013  . Smokeless tobacco: Never Used  . Alcohol use No    FAMILY HISTORY:   Family History  Problem Relation Age of Onset  . Polycystic kidney disease Father   . Aneurysm Father     DRUG ALLERGIES:   Allergies  Allergen Reactions  . Orange Fruit [Citrus] Hives  . Penicillins Hives    Has patient had a PCN reaction causing immediate rash, facial/tongue/throat swelling, SOB or lightheadedness with hypotension: Yes Has patient had a PCN reaction causing severe rash involving mucus membranes or skin necrosis: No Has patient had a PCN reaction that required hospitalization No Has patient had a PCN reaction occurring within the last 10 years: No If all of the above answers are "NO", then may proceed with Cephalosporin use.     REVIEW OF SYSTEMS:   Review of Systems  Constitutional: Negative for fever and weight loss.  HENT: Negative for congestion, nosebleeds and tinnitus.   Eyes: Negative for blurred vision, double vision and  redness.  Respiratory: Negative for cough, hemoptysis and shortness of breath.   Cardiovascular: Positive for chest pain. Negative for orthopnea, leg swelling and PND.  Gastrointestinal: Negative for abdominal pain, diarrhea, melena, nausea and vomiting.  Genitourinary: Negative for dysuria, hematuria and urgency.  Musculoskeletal: Negative for falls and joint pain.  Neurological: Negative for dizziness, tingling, sensory change, focal weakness, seizures, weakness and  headaches.  Endo/Heme/Allergies: Negative for polydipsia. Does not bruise/bleed easily.  Psychiatric/Behavioral: Negative for depression and memory loss. The patient is not nervous/anxious.     MEDICATIONS AT HOME:   Prior to Admission medications   Medication Sig Start Date End Date Taking? Authorizing Provider  cholecalciferol (VITAMIN D) 400 UNITS TABS tablet Take 1,000 Units by mouth.   Yes Historical Provider, MD  cinacalcet (SENSIPAR) 90 MG tablet Take 90 mg by mouth daily.   Yes Historical Provider, MD  clopidogrel (PLAVIX) 75 MG tablet Take 75 mg by mouth daily.   Yes Historical Provider, MD  isosorbide mononitrate (IMDUR) 60 MG 24 hr tablet Take 60 mg by mouth daily.   Yes Historical Provider, MD  rosuvastatin (CRESTOR) 10 MG tablet Take 10 mg by mouth daily.   Yes Historical Provider, MD  sevelamer carbonate (RENVELA) 800 MG tablet Take 2,400 mg by mouth 3 (three) times daily with meals.    Yes Historical Provider, MD  tamsulosin (FLOMAX) 0.4 MG CAPS capsule Take 0.4 mg by mouth.   Yes Historical Provider, MD  traZODone (DESYREL) 50 MG tablet Take 50 mg by mouth at bedtime.   Yes Historical Provider, MD  acetaminophen (TYLENOL) 325 MG tablet Take 650 mg by mouth every 6 (six) hours as needed.    Historical Provider, MD      VITAL SIGNS:  Blood pressure 138/65, pulse 82, temperature 98 F (36.7 C), temperature source Oral, resp. rate 18, height 5\' 10"  (1.778 m), weight 80.7 kg (178 lb), SpO2 92 %.  PHYSICAL EXAMINATION:  Physical Exam  GENERAL:  75 y.o.-year-old patient lying in the bed in no acute distress.  EYES: Pupils equal, round, reactive to light and accommodation. No scleral icterus. Extraocular muscles intact.  HEENT: Head atraumatic, normocephalic. Oropharynx and nasopharynx clear. No oropharyngeal erythema, moist oral mucosa  NECK:  Supple, no jugular venous distention. No thyroid enlargement, no tenderness.  LUNGS: Normal breath sounds bilaterally, no wheezing,  rales, rhonchi. No use of accessory muscles of respiration.  CARDIOVASCULAR: S1, S2 RRR.  II/VI SEM at RSB, no rubs, gallops, clicks.  ABDOMEN: Soft, nontender, nondistended. Bowel sounds present. No organomegaly or mass.  EXTREMITIES: No pedal edema, cyanosis, or clubbing. + 2 pedal & radial pulses b/l.   NEUROLOGIC: Cranial nerves II through XII are intact. No focal Motor or sensory deficits appreciated b/l PSYCHIATRIC: The patient is alert and oriented x 3. Good affect.  SKIN: No obvious rash, lesion, or ulcer.   Right upper extremity AV fistula with good bruit and good thrill.  LABORATORY PANEL:   CBC  Recent Labs Lab 05/31/16 1815  WBC 6.4  HGB 9.4*  HCT 28.6*  PLT 127*   ------------------------------------------------------------------------------------------------------------------  Chemistries   Recent Labs Lab 05/31/16 1815  NA 139  K 3.7  CL 99*  CO2 33*  GLUCOSE 159*  BUN 19  CREATININE 2.85*  CALCIUM 8.2*  AST 13*  ALT 6*  ALKPHOS 87  BILITOT 0.7   ------------------------------------------------------------------------------------------------------------------  Cardiac Enzymes  Recent Labs Lab 05/31/16 1815  TROPONINI <0.03   ------------------------------------------------------------------------------------------------------------------  RADIOLOGY:  Dg Chest Portable 1 View  Result Date: 05/31/2016 CLINICAL DATA:  75 y/o  M; chest pain. EXAM: PORTABLE CHEST 1 VIEW COMPARISON:  07/21/2015 chest radiograph. FINDINGS: Stable cardiomegaly given projection and technique. Aortic atherosclerosis with arch calcification. Aortic valve replacement. Post median sternotomy with wires aligned. Small bilateral pleural effusions and associated bibasilar opacities which probably represent atelectasis. Interstitial prominence may reflect mild interstitial pulmonary edema. Right proximal humerus rotator cuff anchor partially visualized. Bones are otherwise  unremarkable. IMPRESSION: Small bilateral pleural effusions and associated bibasilar opacities which probably represent atelectasis. Pneumonia is not excluded. Interstitial prominence, likely interstitial pulmonary edema. Electronically Signed   By: Kristine Garbe M.D.   On: 05/31/2016 18:37     IMPRESSION AND PLAN:   75 year old male with past medical history of end-stage renal disease on hemodialysis, adult polycystic kidney disease, hypertension, secondary hyperparathyroidism, history of coronary artery disease status post stent placement who presents to the hospital due to chest pain.   1. Chest pain-patient does have significant risk factors given his history of coronary disease, and end-stage renal disease. -We'll observe him on telemetry, cycle his cardiac markers. No acute EKG changes. -Continue Plavix, Imdur, Crestor. I will get a cardiology consult. -Check a two-dimensional echocardiogram.  2. Essential hypertension-continue imdur.  3. ESRD on HD - pt. Gets dialysis on MWF.  Will consult Nephro if pt. Needs dialysis while in hospital.   4. Secondary Hyperparathyroidism - cont. Sensipar, Renvela.   5. BPH - cont. Flomax.   6. Hyperlipidemia - cont. Crestor.    All the records are reviewed and case discussed with ED provider. Management plans discussed with the patient, family and they are in agreement.  CODE STATUS: Full code  TOTAL TIME TAKING CARE OF THIS PATIENT: 40 minutes.    Henreitta Leber M.D on 05/31/2016 at 8:21 PM  Between 7am to 6pm - Pager - [phone]  After 6pm go to [email or web address] - password [email or web address] [facility]  Office  [phone]  CC: Primary care physician; Dion Body, MD

## 2016-05-31 NOTE — ED Provider Notes (Signed)
[facility] Emergency Department Provider Note        Time seen: ----------------------------------------- 6:04 PM on 05/31/2016 -----------------------------------------    I have reviewed the triage vital signs and the nursing notes.   HISTORY  Chief Complaint Chest Pain    HPI Jimmy Carson is a 75 y.o. male who presents to the ER for chest pain.Patient describes sudden onset chest pain at home while lying on his couch. He describes significant amount of pressure that resolved with nitroglycerin spray in route by EMS. Patient reports identical symptoms in 2012 with an MI. He did not have sweats, nausea or shortness of breath. Patient states after the nitroglycerin his pain is resolved. He denies any other complaints at this time.   Past Medical History:  Diagnosis Date  . Anemia   . CAD (coronary artery disease)    s/p percutaneous coronary angioplasty, s/p CABG x 6   . ESRD (end stage renal disease) ([place])   . Heart attack   . History of radiation therapy    for prostate CA  . Hyperlipidemia   . Hyperparathyroidism, secondary renal ([place])   . Hypertension   . Osteoarthritis   . Polycystic kidney disease   . Prostate CA [facility]) 2012   Brachytherapy  . Renal insufficiency    currently on dialysis  . Shingles     Patient Active Problem List   Diagnosis Date Noted  . NSTEMI (non-ST elevated myocardial infarction) ([place]) 07/22/2015  . Chest pain 07/21/2015    Past Surgical History:  Procedure Laterality Date  . AORTIC VALVE REPLACEMENT    . CARDIAC CATHETERIZATION N/A 07/24/2015   Procedure: Coronary/Graft Angiography;  Surgeon: Corey Skains, MD;  Location: [place] [facility];  Service: Cardiovascular;  Laterality: N/A;  . CORONARY ARTERY BYPASS GRAFT    . JOINT REPLACEMENT    . REPLACEMENT TOTAL KNEE BILATERAL    . ROTATOR CUFF REPAIR Right     Allergies Orange fruit [citrus] and Penicillins  Social History Social History   Substance Use Topics  . Smoking status: Former Smoker    Quit date: 04/19/2013  . Smokeless tobacco: Never Used  . Alcohol use No    Review of Systems Constitutional: Negative for fever. Cardiovascular: Positive for chest pain Respiratory: Negative for shortness of breath. Gastrointestinal: Negative for abdominal pain, vomiting and diarrhea. Musculoskeletal: Negative for back pain. Skin: Negative for rash. Neurological: Negative for headaches, focal weakness or numbness.  10-point ROS otherwise negative.  ____________________________________________   PHYSICAL EXAM:  VITAL SIGNS: ED Triage Vitals  Enc Vitals Group     BP      Pulse      Resp      Temp      Temp src      SpO2      Weight      Height      Head Circumference      Peak Flow      Pain Score      Pain Loc      Pain Edu?      Excl. in [place]?     Constitutional: Alert and oriented. Well appearing and in no distress. Eyes: Conjunctivae are normal. PERRL. Normal extraocular movements. ENT   Head: Normocephalic and atraumatic.   Nose: No congestion/rhinnorhea.   Mouth/Throat: Mucous membranes are moist.   Neck: No stridor. Cardiovascular: Normal rate, regular rhythm. No murmurs, rubs, or gallops. Respiratory: Normal respiratory effort without tachypnea nor retractions. Breath sounds are clear  and equal bilaterally. No wheezes/rales/rhonchi. Gastrointestinal: Soft and nontender. Normal bowel sounds Musculoskeletal: Nontender with normal range of motion in all extremities. No lower extremity tenderness nor edema. Neurologic:  Normal speech and language. No gross focal neurologic deficits are appreciated.  Skin:  Skin is warm, dry and intact. No rash noted. Psychiatric: Mood and affect are normal. Speech and behavior are normal.  ____________________________________________  EKG: Interpreted by me.Sinus rhythm rate 87 bpm, left bundle branch block, normal PR interval, wide QRS, long  QT,  ____________________________________________  ED COURSE:  Pertinent labs & imaging results that were available during my care of the patient were reviewed by me and considered in my medical decision making (see chart for details). Clinical Course   Patient resents to ER with concerning chest pain for unstable angina. We will assess with labs and imaging, he will likely need admission and stress test and/or cath  Procedures ____________________________________________   LABS (pertinent positives/negatives)  Labs Reviewed  CBC - Abnormal; Notable for the following:       Result Value   RBC 3.22 (*)    Hemoglobin 9.4 (*)    HCT 28.6 (*)    RDW 17.0 (*)    Platelets 127 (*)    All other components within normal limits  COMPREHENSIVE METABOLIC PANEL - Abnormal; Notable for the following:    Chloride 99 (*)    CO2 33 (*)    Glucose, Bld 159 (*)    Creatinine, Ser 2.85 (*)    Calcium 8.2 (*)    Total Protein 6.4 (*)    Albumin 3.2 (*)    AST 13 (*)    ALT 6 (*)    GFR calc non Af Amer 20 (*)    GFR calc Af Amer 24 (*)    All other components within normal limits  TROPONIN I    RADIOLOGY Images were viewed by me  Chest x-ray IMPRESSION: Small bilateral pleural effusions and associated bibasilar opacities which probably represent atelectasis. Pneumonia is not excluded. Interstitial prominence, likely interstitial pulmonary edema. ____________________________________________  FINAL ASSESSMENT AND PLAN  Chest pain  Plan: Patient with labs and imaging as dictated above. Patient had presented to the ER for chest pain that resolved with nitroglycerin. He is too high risk to risk stratify without admission and stress testing. I will discuss with the hospitalist for admission. Current labs are within normal limits for him.   Earleen Newport, MD   Note: This dictation was prepared with Dragon dictation. Any transcriptional errors that result from this process  are unintentional    Earleen Newport, MD 05/31/16 1940

## 2016-05-31 NOTE — ED Triage Notes (Signed)
Pt from home via EMS, reports midsternal CP since today. Took 4 aspirin prior to arrival, EMS gave 1 nitro spray, pt denies pain currently

## 2016-06-01 DIAGNOSIS — I120 Hypertensive chronic kidney disease with stage 5 chronic kidney disease or end stage renal disease: Secondary | ICD-10-CM | POA: Diagnosis present

## 2016-06-01 DIAGNOSIS — Z992 Dependence on renal dialysis: Secondary | ICD-10-CM | POA: Diagnosis not present

## 2016-06-01 DIAGNOSIS — E785 Hyperlipidemia, unspecified: Secondary | ICD-10-CM | POA: Diagnosis present

## 2016-06-01 DIAGNOSIS — I214 Non-ST elevation (NSTEMI) myocardial infarction: Secondary | ICD-10-CM | POA: Diagnosis present

## 2016-06-01 DIAGNOSIS — Z955 Presence of coronary angioplasty implant and graft: Secondary | ICD-10-CM | POA: Diagnosis not present

## 2016-06-01 DIAGNOSIS — Z951 Presence of aortocoronary bypass graft: Secondary | ICD-10-CM | POA: Diagnosis not present

## 2016-06-01 DIAGNOSIS — Z8546 Personal history of malignant neoplasm of prostate: Secondary | ICD-10-CM | POA: Diagnosis not present

## 2016-06-01 DIAGNOSIS — I447 Left bundle-branch block, unspecified: Secondary | ICD-10-CM | POA: Diagnosis present

## 2016-06-01 DIAGNOSIS — Z87891 Personal history of nicotine dependence: Secondary | ICD-10-CM | POA: Diagnosis not present

## 2016-06-01 DIAGNOSIS — I2510 Atherosclerotic heart disease of native coronary artery without angina pectoris: Secondary | ICD-10-CM | POA: Diagnosis present

## 2016-06-01 DIAGNOSIS — Z923 Personal history of irradiation: Secondary | ICD-10-CM | POA: Diagnosis not present

## 2016-06-01 DIAGNOSIS — R079 Chest pain, unspecified: Secondary | ICD-10-CM | POA: Diagnosis present

## 2016-06-01 DIAGNOSIS — D631 Anemia in chronic kidney disease: Secondary | ICD-10-CM | POA: Diagnosis present

## 2016-06-01 DIAGNOSIS — Z91018 Allergy to other foods: Secondary | ICD-10-CM | POA: Diagnosis not present

## 2016-06-01 DIAGNOSIS — Z8271 Family history of polycystic kidney: Secondary | ICD-10-CM | POA: Diagnosis not present

## 2016-06-01 DIAGNOSIS — Z880 Allergy status to penicillin: Secondary | ICD-10-CM | POA: Diagnosis not present

## 2016-06-01 DIAGNOSIS — I97618 Postprocedural hemorrhage and hematoma of a circulatory system organ or structure following other circulatory system procedure: Secondary | ICD-10-CM | POA: Diagnosis present

## 2016-06-01 DIAGNOSIS — N186 End stage renal disease: Secondary | ICD-10-CM | POA: Diagnosis present

## 2016-06-01 DIAGNOSIS — I252 Old myocardial infarction: Secondary | ICD-10-CM | POA: Diagnosis not present

## 2016-06-01 DIAGNOSIS — E039 Hypothyroidism, unspecified: Secondary | ICD-10-CM | POA: Diagnosis present

## 2016-06-01 DIAGNOSIS — Z952 Presence of prosthetic heart valve: Secondary | ICD-10-CM | POA: Diagnosis not present

## 2016-06-01 DIAGNOSIS — Q612 Polycystic kidney, adult type: Secondary | ICD-10-CM | POA: Diagnosis not present

## 2016-06-01 DIAGNOSIS — N400 Enlarged prostate without lower urinary tract symptoms: Secondary | ICD-10-CM | POA: Diagnosis present

## 2016-06-01 DIAGNOSIS — J9811 Atelectasis: Secondary | ICD-10-CM | POA: Diagnosis present

## 2016-06-01 DIAGNOSIS — N2581 Secondary hyperparathyroidism of renal origin: Secondary | ICD-10-CM | POA: Diagnosis present

## 2016-06-01 LAB — CBC
HCT: 26.1 % — ABNORMAL LOW (ref 40.0–52.0)
HEMOGLOBIN: 8.6 g/dL — AB (ref 13.0–18.0)
MCH: 29 pg (ref 26.0–34.0)
MCHC: 33.1 g/dL (ref 32.0–36.0)
MCV: 87.6 fL (ref 80.0–100.0)
PLATELETS: 125 10*3/uL — AB (ref 150–440)
RBC: 2.98 MIL/uL — ABNORMAL LOW (ref 4.40–5.90)
RDW: 17.6 % — AB (ref 11.5–14.5)
WBC: 5.6 10*3/uL (ref 3.8–10.6)

## 2016-06-01 LAB — BASIC METABOLIC PANEL
ANION GAP: 9 (ref 5–15)
BUN: 27 mg/dL — AB (ref 6–20)
CALCIUM: 8 mg/dL — AB (ref 8.9–10.3)
CO2: 30 mmol/L (ref 22–32)
CREATININE: 3.89 mg/dL — AB (ref 0.61–1.24)
Chloride: 100 mmol/L — ABNORMAL LOW (ref 101–111)
GFR calc Af Amer: 16 mL/min — ABNORMAL LOW (ref >60)
GFR, EST NON AFRICAN AMERICAN: 14 mL/min — AB (ref >60)
GLUCOSE: 72 mg/dL (ref 65–99)
Potassium: 4.2 mmol/L (ref 3.5–5.1)
Sodium: 139 mmol/L (ref 135–145)

## 2016-06-01 LAB — PROTIME-INR
INR: 1.19
Prothrombin Time: 15.2 seconds (ref 11.4–15.2)

## 2016-06-01 LAB — APTT: aPTT: 41 seconds — ABNORMAL HIGH (ref 24–36)

## 2016-06-01 LAB — HEPARIN LEVEL (UNFRACTIONATED)
Heparin Unfractionated: 0.1 IU/mL — ABNORMAL LOW (ref 0.30–0.70)
Heparin Unfractionated: 0.22 IU/mL — ABNORMAL LOW (ref 0.30–0.70)

## 2016-06-01 LAB — TROPONIN I
TROPONIN I: 0.77 ng/mL — AB (ref <0.03)
TROPONIN I: 1.7 ng/mL — AB (ref <0.03)

## 2016-06-01 MED ORDER — HEPARIN BOLUS VIA INFUSION
1150.0000 [IU] | Freq: Once | INTRAVENOUS | Status: AC
  Administered 2016-06-01: 1150 [IU] via INTRAVENOUS
  Filled 2016-06-01: qty 1150

## 2016-06-01 MED ORDER — HEPARIN BOLUS VIA INFUSION
2300.0000 [IU] | Freq: Once | INTRAVENOUS | Status: AC
  Administered 2016-06-01: 2300 [IU] via INTRAVENOUS
  Filled 2016-06-01: qty 2300

## 2016-06-01 MED ORDER — HEPARIN (PORCINE) IN NACL 100-0.45 UNIT/ML-% IJ SOLN
1450.0000 [IU]/h | INTRAMUSCULAR | Status: DC
  Administered 2016-06-01: 1300 [IU]/h via INTRAVENOUS
  Administered 2016-06-01: 1000 [IU]/h via INTRAVENOUS
  Administered 2016-06-02 – 2016-06-03 (×2): 1450 [IU]/h via INTRAVENOUS
  Filled 2016-06-01 (×4): qty 250

## 2016-06-01 MED ORDER — HEPARIN BOLUS VIA INFUSION
4000.0000 [IU] | Freq: Once | INTRAVENOUS | Status: AC
  Administered 2016-06-01: 4000 [IU] via INTRAVENOUS
  Filled 2016-06-01: qty 4000

## 2016-06-01 NOTE — Progress Notes (Signed)
ANTICOAGULATION CONSULT NOTE - Initial Consult  Pharmacy Consult for heparin Indication: ACS/positive troponin  Allergies  Allergen Reactions  . Orange Fruit [Citrus] Hives  . Penicillins Hives    Has patient had a PCN reaction causing immediate rash, facial/tongue/throat swelling, SOB or lightheadedness with hypotension: Yes Has patient had a PCN reaction causing severe rash involving mucus membranes or skin necrosis: No Has patient had a PCN reaction that required hospitalization No Has patient had a PCN reaction occurring within the last 10 years: No If all of the above answers are "NO", then may proceed with Cephalosporin use.     Patient Measurements: Height: 5\' 10"  (177.8 cm) Weight: 170 lb 1.6 oz (77.2 kg) IBW/kg (Calculated) : 73 Heparin Dosing Weight: 77.2 kg  Vital Signs: Temp: 98.3 F (36.8 C) (12/09 1939) Temp Source: Oral (12/09 1136) BP: 140/30 (12/09 1939) Pulse Rate: 66 (12/09 1939)  Labs:  Recent Labs  05/31/16 1815 05/31/16 2236 06/01/16 0018 06/01/16 0214 06/01/16 0611 06/01/16 0915 06/01/16 1904  HGB 9.4*  --   --   --  8.6*  --   --   HCT 28.6*  --   --   --  26.1*  --   --   PLT 127*  --   --   --  125*  --   --   APTT  --   --  41*  --   --   --   --   LABPROT  --   --  15.2  --   --   --   --   INR  --   --  1.19  --   --   --   --   HEPARINUNFRC  --   --   --   --   --  0.10* 0.22*  CREATININE 2.85*  --   --   --  3.89*  --   --   TROPONINI <0.03 0.11*  --  0.77* 1.70*  --   --     Estimated Creatinine Clearance: 17.5 mL/min (by C-G formula based on SCr of 3.89 mg/dL (H)).   Medical History: Past Medical History:  Diagnosis Date  . Anemia   . CAD (coronary artery disease)    s/p percutaneous coronary angioplasty, s/p CABG x 6   . ESRD (end stage renal disease) ([place])   . Heart attack   . History of radiation therapy    for prostate CA  . Hyperlipidemia   . Hyperparathyroidism, secondary renal ([place])   . Hypertension   .  Osteoarthritis   . Polycystic kidney disease   . Prostate CA [facility]) 2012   Brachytherapy  . Renal insufficiency    currently on dialysis  . Shingles     Medications:  Infusions:  . heparin 1,300 Units/hr (06/01/16 1617)    Assessment: 74 yom cc CP with positive troponin. Pharmacy consulted to dose heparin. No PTA AC listed, has not received LMWH yet. DC [facility] and start UFH.   Goal of Therapy:  Heparin level 0.3-0.7 units/ml Monitor platelets by anticoagulation protocol: Yes   Plan:  Current orders for heparin 1300 units/hr. Heparin level subtherapeutic at 0.22 Will give a bolus of 1150 and increase rate to 1450 units/hr Recheck in 8 hours Confirmed with RN that drip had not been turned off.   Ramond Dial, Pharm.D., BCPS Clinical Pharmacist 06/01/2016,7:56 PM

## 2016-06-01 NOTE — Consult Note (Signed)
[facility] Cardiology  CARDIOLOGY CONSULT NOTE  Patient ID: Jimmy Carson MRN: [id number] DOB/AGE: April 22, 1941 75 y.o.  Admit date: 05/31/2016 Referring Physician Verdell Carmine Primary Physician [place] Primary Cardiologist Tay Whitwell Reason for Consultation Chest pain  HPI: 75 year old gentleman referred for evaluation of chest pain non-ST elevation myocardial infarction. The patient has known coronary artery disease, status post stent RCA 01/02/2011, desiring stent proximal RCA 05/03/2011, status post CABG 6 08/30/2011. 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 Patient has end-stage renal disease on chronic hemodialysis. Yesterday, following hemodialysis, the patient went home and developed substernal chest pain which lasted approximately an hour. She took 4 aspirin and nitroglycerin spray en route via EMS. Chest pain was nearly resolved by time he arrived to [facility] ER. ECG revealed normal sinus rhythm with left bundle-branch block. Admission labs were notable for troponin 0.11 with follow-up 1.70 consistent with non-ST elevation myocardial infarction. Patient has had no recurrent chest pain currently on heparin drip.  Review of systems complete and found to be negative unless listed above     Past Medical History:  Diagnosis Date  . Anemia   . CAD (coronary artery disease)    s/p percutaneous coronary angioplasty, s/p CABG x 6   . ESRD (end stage renal disease) ([place])   . Heart attack   . History of radiation therapy    for prostate CA  . Hyperlipidemia   . Hyperparathyroidism, secondary renal ([place])   . Hypertension   . Osteoarthritis   . Polycystic kidney disease   . Prostate CA [facility]) 2012   Brachytherapy  . Renal insufficiency    currently on dialysis  . Shingles     Past Surgical History:  Procedure Laterality Date  . AORTIC VALVE REPLACEMENT    . CARDIAC CATHETERIZATION N/A 07/24/2015   Procedure: Coronary/Graft Angiography;  Surgeon: Corey Skains, MD;  Location: [place] [facility];  Service: Cardiovascular;  Laterality: N/A;  . CORONARY ARTERY BYPASS GRAFT    . JOINT REPLACEMENT    . REPLACEMENT TOTAL KNEE BILATERAL    . ROTATOR CUFF REPAIR Right     Prescriptions Prior to Admission  Medication Sig Dispense Refill Last Dose  . cholecalciferol (VITAMIN D) 400 UNITS TABS tablet Take 1,000 Units by mouth.   05/30/2016 at Unknown time  . cinacalcet (SENSIPAR) 90 MG tablet Take 90 mg by mouth daily.   05/30/2016 at Unknown time  . clopidogrel (PLAVIX) 75 MG tablet Take 75 mg by mouth daily.   05/30/2016 at Unknown time  . isosorbide mononitrate (IMDUR) 60 MG 24 hr tablet Take 60 mg by mouth daily.   05/30/2016 at Unknown time  . rosuvastatin (CRESTOR) 10 MG tablet Take 10 mg by mouth daily.   05/30/2016 at Unknown time  . sevelamer carbonate (RENVELA) 800 MG tablet Take 2,400 mg by mouth 3 (three) times daily with meals.    05/30/2016 at Unknown time  . tamsulosin (FLOMAX) 0.4 MG CAPS capsule Take 0.4 mg by mouth.   05/30/2016 at Unknown time  . traZODone (DESYREL) 50 MG tablet Take 50 mg by mouth at bedtime.   05/30/2016 at Unknown time  . acetaminophen (TYLENOL) 325 MG tablet Take 650 mg by mouth every 6 (six) hours as needed.   prn at prn   Social History   Social History  . Marital status: Widowed    Spouse name: N/A  . Number of children: N/A  . Years of education: N/A   Occupational History  . Not on file.  Social History Main Topics  . Smoking status: Former Smoker    Quit date: 04/19/2013  . Smokeless tobacco: Never Used  . Alcohol use No  . Drug use: Unknown  . Sexual activity: Not on file   Other Topics Concern  . Not on file   Social History Narrative  . No narrative on file    Family History  Problem Relation Age of Onset  . Polycystic kidney disease Father   . Aneurysm Father       Review of systems complete and found to be negative unless listed above      PHYSICAL EXAM  General: Well developed, well nourished, in no  acute distress HEENT:  Normocephalic and atramatic Neck:  No JVD.  Lungs: Clear bilaterally to auscultation and percussion. Heart: HRRR . Normal S1 and S2 without gallops or murmurs.  Abdomen: Bowel sounds are positive, abdomen soft and non-tender  Msk:  Back normal, normal gait. Normal strength and tone for age. Extremities: No clubbing, cyanosis or edema.   Neuro: Alert and oriented X 3. Psych:  Good affect, responds appropriately  Labs:   Lab Results  Component Value Date   WBC 5.6 06/01/2016   HGB 8.6 (L) 06/01/2016   HCT 26.1 (L) 06/01/2016   MCV 87.6 06/01/2016   PLT 125 (L) 06/01/2016    Recent Labs Lab 05/31/16 1815 06/01/16 0611  NA 139 139  K 3.7 4.2  CL 99* 100*  CO2 33* 30  BUN 19 27*  CREATININE 2.85* 3.89*  CALCIUM 8.2* 8.0*  PROT 6.4*  --   BILITOT 0.7  --   ALKPHOS 87  --   ALT 6*  --   AST 13*  --   GLUCOSE 159* 72   Lab Results  Component Value Date   CKTOTAL 100 07/05/2014   CKMB 6.7 (H) 07/05/2014   TROPONINI 1.70 ([place]) 06/01/2016   No results found for: CHOL No results found for: HDL No results found for: LDLCALC No results found for: TRIG No results found for: CHOLHDL No results found for: LDLDIRECT    Radiology: Dg Chest Portable 1 View  Result Date: 05/31/2016 CLINICAL DATA:  75 y/o  M; chest pain. EXAM: PORTABLE CHEST 1 VIEW COMPARISON:  07/21/2015 chest radiograph. FINDINGS: Stable cardiomegaly given projection and technique. Aortic atherosclerosis with arch calcification. Aortic valve replacement. Post median sternotomy with wires aligned. Small bilateral pleural effusions and associated bibasilar opacities which probably represent atelectasis. Interstitial prominence may reflect mild interstitial pulmonary edema. Right proximal humerus rotator cuff anchor partially visualized. Bones are otherwise unremarkable. IMPRESSION: Small bilateral pleural effusions and associated bibasilar opacities which probably represent atelectasis. Pneumonia  is not excluded. Interstitial prominence, likely interstitial pulmonary edema. Electronically Signed   By: Kristine Garbe M.D.   On: 05/31/2016 18:37    EKG: Normal sinus rhythm, left bundle branch block  ASSESSMENT AND PLAN:   1. Non-ST elevation myocardial infarction, on heparin drip 2. Known CAD, status post CABG 6 3. History of aortic stenosis, status post TAVR  Recommendations  1. Agree with current therapy 2. Continue heparin drip 3. Review 2-D echocardiogram 4. Proceed with cardiac catheterization with selective coronary arteriography on 06/03/2016. The risks, benefits alternatives to cardiac catheterization were explained to the patient and informed written consent was obtained.  Signed: Isaias Cowman MD,PhD, [facility] 06/01/2016, 9:29 AM

## 2016-06-01 NOTE — Care Management Obs Status (Signed)
[place] NOTIFICATION   Patient Details  Name: [name] MRN: [id number] Date of Birth: 11/09/1940   Medicare Observation Status Notification Given:  No (converted to inpatient in less than 24 hours)    Ival Bible, RN 06/01/2016, 11:15 AM

## 2016-06-01 NOTE — Progress Notes (Signed)
[place] [facility] [place] NAME: Jimmy Carson    MR#:  [id number]  DATE OF BIRTH:  01/09/1941  SUBJECTIVE:   Patient is here due to chest pain, has ruled in for a non-ST elevation MI. Plan for cardiac catheterization on Monday. Currently chest pain-free and hemodynamically stable.  remains on a heparin drip.  REVIEW OF SYSTEMS:    Review of Systems  Constitutional: Negative for chills and fever.  HENT: Negative for congestion and tinnitus.   Eyes: Negative for blurred vision and double vision.  Respiratory: Negative for cough, shortness of breath and wheezing.   Cardiovascular: Negative for chest pain, orthopnea and PND.  Gastrointestinal: Negative for abdominal pain, diarrhea, nausea and vomiting.  Genitourinary: Negative for dysuria and hematuria.  Neurological: Negative for dizziness, sensory change and focal weakness.  All other systems reviewed and are negative.   Nutrition: Heart/Renal Tolerating Diet: Yes Tolerating PT: Ambulatory  DRUG ALLERGIES:   Allergies  Allergen Reactions  . Orange Fruit [Citrus] Hives  . Penicillins Hives    Has patient had a PCN reaction causing immediate rash, facial/tongue/throat swelling, SOB or lightheadedness with hypotension: Yes Has patient had a PCN reaction causing severe rash involving mucus membranes or skin necrosis: No Has patient had a PCN reaction that required hospitalization No Has patient had a PCN reaction occurring within the last 10 years: No If all of the above answers are "NO", then may proceed with Cephalosporin use.     VITALS:  Blood pressure (!) 134/55, pulse 76, temperature 98.2 F (36.8 C), temperature source Oral, resp. rate 16, height 5\' 10"  (1.778 m), weight 77.2 kg (170 lb 1.6 oz), SpO2 95 %.  PHYSICAL EXAMINATION:   Physical Exam  GENERAL:  75 y.o.-year-old patient lying in the bed in no acute distress.  EYES: Pupils equal, round, reactive to light and  accommodation. No scleral icterus. Extraocular muscles intact.  HEENT: Head atraumatic, normocephalic. Oropharynx and nasopharynx clear.  NECK:  Supple, no jugular venous distention. No thyroid enlargement, no tenderness.  LUNGS: Normal breath sounds bilaterally, no wheezing, rales, rhonchi. No use of accessory muscles of respiration.  CARDIOVASCULAR: S1, S2 normal. II/VI SEM at LSB, No rubs, or gallops.  ABDOMEN: Soft, nontender, nondistended. Bowel sounds present. No organomegaly or mass.  EXTREMITIES: No cyanosis, clubbing or edema b/l.    NEUROLOGIC: Cranial nerves II through XII are intact. No focal Motor or sensory deficits b/l.   PSYCHIATRIC: The patient is alert and oriented x 3.  SKIN: No obvious rash, lesion, or ulcer.   Left upper ext. AV fistula with good bruit and thrill.   LABORATORY PANEL:   CBC  Recent Labs Lab 06/01/16 0611  WBC 5.6  HGB 8.6*  HCT 26.1*  PLT 125*   ------------------------------------------------------------------------------------------------------------------  Chemistries   Recent Labs Lab 05/31/16 1815 06/01/16 0611  NA 139 139  K 3.7 4.2  CL 99* 100*  CO2 33* 30  GLUCOSE 159* 72  BUN 19 27*  CREATININE 2.85* 3.89*  CALCIUM 8.2* 8.0*  AST 13*  --   ALT 6*  --   ALKPHOS 87  --   BILITOT 0.7  --    ------------------------------------------------------------------------------------------------------------------  Cardiac Enzymes  Recent Labs Lab 06/01/16 0611  TROPONINI 1.70*   ------------------------------------------------------------------------------------------------------------------  RADIOLOGY:  Dg Chest Portable 1 View  Result Date: 05/31/2016 CLINICAL DATA:  75 y/o  M; chest pain. EXAM: PORTABLE CHEST 1 VIEW COMPARISON:  07/21/2015 chest radiograph. FINDINGS: Stable cardiomegaly given projection  and technique. Aortic atherosclerosis with arch calcification. Aortic valve replacement. Post median sternotomy with  wires aligned. Small bilateral pleural effusions and associated bibasilar opacities which probably represent atelectasis. Interstitial prominence may reflect mild interstitial pulmonary edema. Right proximal humerus rotator cuff anchor partially visualized. Bones are otherwise unremarkable. IMPRESSION: Small bilateral pleural effusions and associated bibasilar opacities which probably represent atelectasis. Pneumonia is not excluded. Interstitial prominence, likely interstitial pulmonary edema. Electronically Signed   By: Kristine Garbe M.D.   On: 05/31/2016 18:37     ASSESSMENT AND PLAN:   75 year old male with past medical history of end-stage renal disease on hemodialysis, adult polycystic kidney disease, hypertension, secondary hyperparathyroidism, history of coronary artery disease status post stent placement who presents to the hospital due to chest pain.   1. NSTEMI- pt. Has ruled in by cardiac markers.   - cont. ASA, Heparin gtt, B-blocker, Statin, Plavix.  - appreciate Cardiology consult and plan for cardiac cath on Monday 06/03/16 - await Echo results.   2. Essential hypertension-continue imdur, Metoprolol.    3. ESRD on HD - nephrology consulted as pt. To have cath on Monday. Will get HD on Monday after HD.  - dialysis on MWF.   4. Secondary Hyperparathyroidism - cont. Sensipar, Renvela.   5. BPH - cont. Flomax.   6. Hyperlipidemia - cont. Crestor   All the records are reviewed and case discussed with Care Management/Social Worker. Management plans discussed with the patient, family and they are in agreement.  CODE STATUS: Full code  DVT Prophylaxis: heparin gtt  TOTAL TIME TAKING CARE OF THIS PATIENT: 30 minutes.   POSSIBLE D/C IN 1-2 DAYS, DEPENDING ON CLINICAL CONDITION.   Henreitta Leber M.D on 06/01/2016 at 1:56 PM  Between 7am to 6pm - Pager - [phone]  After 6pm go to [email or web address] - Proofreader  [facility]  Hospitalists  Office  [phone]  CC: Primary care physician; Dion Body, MD

## 2016-06-01 NOTE — Progress Notes (Signed)
[facility]  ROUNDING NOTE   Subjective:   Andras Grunewald admitted to [facility] on 05/31/2016 for Nonspecific chest pain [R07.9]   Last hemodialysis yesterday.   Cardiology is planning on catheterization on Monday  Objective:  Vital signs in last 24 hours:  Temp:  [98 F (36.7 C)-99.3 F (37.4 C)] 98.2 F (36.8 C) (12/09 1136) Pulse Rate:  [68-87] 76 (12/09 1136) Resp:  [16-21] 16 (12/09 1136) BP: (127-154)/(55-73) 134/55 (12/09 1136) SpO2:  [91 %-97 %] 95 % (12/09 1136) Weight:  [77.2 kg (170 lb 1.6 oz)-80.7 kg (178 lb)] 77.2 kg (170 lb 1.6 oz) (12/08 2109)  Weight change:  Filed Weights   05/31/16 1804 05/31/16 2109  Weight: 80.7 kg (178 lb) 77.2 kg (170 lb 1.6 oz)    Intake/Output: No intake/output data recorded.   Intake/Output this shift:  Total I/O In: 360 [P.O.:360] Out: 0   Physical Exam: General: NAD,   Head: Normocephalic, atraumatic. Moist oral mucosal membranes  Eyes: Anicteric, PERRL  Neck: Supple, trachea midline  Lungs:  Clear to auscultation  Heart: Regular rate and rhythm  Abdomen:  Soft, nontender,   Extremities: no peripheral edema.  Neurologic: Nonfocal, moving all four extremities  Skin: No lesions  Access: Aneurysmal AVF    Basic Metabolic Panel:  Recent Labs Lab 05/31/16 1815 06/01/16 0611  NA 139 139  K 3.7 4.2  CL 99* 100*  CO2 33* 30  GLUCOSE 159* 72  BUN 19 27*  CREATININE 2.85* 3.89*  CALCIUM 8.2* 8.0*    Liver Function Tests:  Recent Labs Lab 05/31/16 1815  AST 13*  ALT 6*  ALKPHOS 87  BILITOT 0.7  PROT 6.4*  ALBUMIN 3.2*   No results for input(s): LIPASE, AMYLASE in the last 168 hours. No results for input(s): AMMONIA in the last 168 hours.  CBC:  Recent Labs Lab 05/31/16 1815 06/01/16 0611  WBC 6.4 5.6  HGB 9.4* 8.6*  HCT 28.6* 26.1*  MCV 88.7 87.6  PLT 127* 125*    Cardiac Enzymes:  Recent Labs Lab 05/31/16 1815 05/31/16 2236 06/01/16 0214 06/01/16 0611  TROPONINI <0.03  0.11* 0.77* 1.70*    BNP: Invalid input(s): POCBNP  CBG: No results for input(s): GLUCAP in the last 168 hours.  Microbiology: Results for orders placed or performed in visit on 02/25/13  Body fluid culture     Status: None   Collection Time: 02/25/13  9:00 AM  Result Value Ref Range Status   Micro Text Report   Final       SOURCE: LEFT KNEE    COMMENT                   NO GROWTH AEROBICALLY/ANAEROBICALLY IN 4 DAYS   GRAM STAIN                MANY WHITE BLOOD CELLS   GRAM STAIN                NO ORGANISMS SEEN   ANTIBIOTIC                                                        Coagulation Studies:  Recent Labs  06/01/16 0018  LABPROT 15.2  INR 1.19    Urinalysis: No results for input(s): COLORURINE, LABSPEC, [place], GLUCOSEU, [place], BILIRUBINUR, KETONESUR,  PROTEINUR, UROBILINOGEN, NITRITE, LEUKOCYTESUR in the last 72 hours.  Invalid input(s): APPERANCEUR    Imaging: Dg Chest Portable 1 View  Result Date: 05/31/2016 CLINICAL DATA:  75 y/o  M; chest pain. EXAM: PORTABLE CHEST 1 VIEW COMPARISON:  07/21/2015 chest radiograph. FINDINGS: Stable cardiomegaly given projection and technique. Aortic atherosclerosis with arch calcification. Aortic valve replacement. Post median sternotomy with wires aligned. Small bilateral pleural effusions and associated bibasilar opacities which probably represent atelectasis. Interstitial prominence may reflect mild interstitial pulmonary edema. Right proximal humerus rotator cuff anchor partially visualized. Bones are otherwise unremarkable. IMPRESSION: Small bilateral pleural effusions and associated bibasilar opacities which probably represent atelectasis. Pneumonia is not excluded. Interstitial prominence, likely interstitial pulmonary edema. Electronically Signed   By: Kristine Garbe M.D.   On: 05/31/2016 18:37     Medications:   . heparin 1,300 Units/hr (06/01/16 1151)   . cholecalciferol  1,000 Units Oral QHS  .  cinacalcet  90 mg Oral Q breakfast  . clopidogrel  75 mg Oral QHS  . isosorbide mononitrate  60 mg Oral QHS  . metoprolol tartrate  12.5 mg Oral BID  . rosuvastatin  10 mg Oral QHS  . sevelamer carbonate  2,400 mg Oral TID WC  . sodium chloride flush  3 mL Intravenous Q12H  . tamsulosin  0.4 mg Oral QHS  . traZODone  50 mg Oral QHS   acetaminophen **OR** acetaminophen, morphine injection, nitroGLYCERIN, ondansetron **OR** ondansetron (ZOFRAN) IV  Assessment/ Plan:  Mr. Eldridge Marcott is a 75 y.o. white male with hypothyroidism, hypertension, BPH, CAD status post CABG, hyperlipidemia.   [facility] [place] MWF  1. End Stage Renal Disease:  - Continue MWF schedule.  - dialysis after cardiac catheterization on Monday  2. Hypertension: blood pressure well controlled.  - Imdur and tamsulosin  3. Secondary Hyperparathyroidism:  - sevelamer to 3 tabs with meals - continue cinacalcet.   4. Anemia of chronic kidney disease: hemoglobin 8.6 - with ischemia, will hold epo. Gets micera as outpatient.    LOS: [place], [place] 12/9/201712:53 PM

## 2016-06-01 NOTE — Progress Notes (Signed)
ANTICOAGULATION CONSULT NOTE - Initial Consult  Pharmacy Consult for heparin Indication: ACS/positive troponin  Allergies  Allergen Reactions  . Orange Fruit [Citrus] Hives  . Penicillins Hives    Has patient had a PCN reaction causing immediate rash, facial/tongue/throat swelling, SOB or lightheadedness with hypotension: Yes Has patient had a PCN reaction causing severe rash involving mucus membranes or skin necrosis: No Has patient had a PCN reaction that required hospitalization No Has patient had a PCN reaction occurring within the last 10 years: No If all of the above answers are "NO", then may proceed with Cephalosporin use.     Patient Measurements: Height: 5\' 10"  (177.8 cm) Weight: 170 lb 1.6 oz (77.2 kg) IBW/kg (Calculated) : 73 Heparin Dosing Weight: 77.2 kg  Vital Signs: Temp: 98.4 F (36.9 C) (12/08 2109) Temp Source: Oral (12/08 2109) BP: 154/68 (12/08 2109) Pulse Rate: 77 (12/08 2109)  Labs:  Recent Labs  05/31/16 1815 05/31/16 2236  HGB 9.4*  --   HCT 28.6*  --   PLT 127*  --   CREATININE 2.85*  --   TROPONINI <0.03 0.11*    Estimated Creatinine Clearance: 23.8 mL/min (by C-G formula based on SCr of 2.85 mg/dL (H)).   Medical History: Past Medical History:  Diagnosis Date  . Anemia   . CAD (coronary artery disease)    s/p percutaneous coronary angioplasty, s/p CABG x 6   . ESRD (end stage renal disease) ([place])   . Heart attack   . History of radiation therapy    for prostate CA  . Hyperlipidemia   . Hyperparathyroidism, secondary renal ([place])   . Hypertension   . Osteoarthritis   . Polycystic kidney disease   . Prostate CA [facility]) 2012   Brachytherapy  . Renal insufficiency    currently on dialysis  . Shingles     Medications:  Infusions:  . heparin      Assessment: 74 yom cc CP with positive troponin. Pharmacy consulted to dose heparin. No PTA AC listed, has not received LMWH yet. DC LWM[facility] and start UFH.   Goal of Therapy:   Heparin level 0.3-0.7 units/ml Monitor platelets by anticoagulation protocol: Yes   Plan:  Give 4000 units bolus x 1 Start heparin infusion at 1000 units/hr Check anti-Xa level in 8 hours and daily while on heparin Continue to monitor H&H and platelets  Laural Benes, Pharm.D., BCPS Clinical Pharmacist 06/01/2016,12:04 AM

## 2016-06-01 NOTE — Progress Notes (Signed)
MD alerted of critical troponin. New medication added. Third troponin critical. MD notified. Acknowledged.

## 2016-06-01 NOTE — Progress Notes (Signed)
ANTICOAGULATION CONSULT NOTE - Initial Consult  Pharmacy Consult for heparin Indication: ACS/positive troponin  Allergies  Allergen Reactions  . Orange Fruit [Citrus] Hives  . Penicillins Hives    Has patient had a PCN reaction causing immediate rash, facial/tongue/throat swelling, SOB or lightheadedness with hypotension: Yes Has patient had a PCN reaction causing severe rash involving mucus membranes or skin necrosis: No Has patient had a PCN reaction that required hospitalization No Has patient had a PCN reaction occurring within the last 10 years: No If all of the above answers are "NO", then may proceed with Cephalosporin use.     Patient Measurements: Height: 5\' 10"  (177.8 cm) Weight: 170 lb 1.6 oz (77.2 kg) IBW/kg (Calculated) : 73 Heparin Dosing Weight: 77.2 kg  Vital Signs: Temp: 99.3 F (37.4 C) (12/09 0942) Temp Source: Oral (12/09 0942) BP: 138/61 (12/09 0942) Pulse Rate: 75 (12/09 0942)  Labs:  Recent Labs  05/31/16 1815 05/31/16 2236 06/01/16 0018 06/01/16 0214 06/01/16 6333 06/01/16 0915  HGB 9.4*  --   --   --  8.6*  --   HCT 28.6*  --   --   --  26.1*  --   PLT 127*  --   --   --  125*  --   APTT  --   --  41*  --   --   --   LABPROT  --   --  15.2  --   --   --   INR  --   --  1.19  --   --   --   HEPARINUNFRC  --   --   --   --   --  0.10*  CREATININE 2.85*  --   --   --  3.89*  --   TROPONINI <0.03 0.11*  --  0.77* 1.70*  --     Estimated Creatinine Clearance: 17.5 mL/min (by C-G formula based on SCr of 3.89 mg/dL (H)).   Medical History: Past Medical History:  Diagnosis Date  . Anemia   . CAD (coronary artery disease)    s/p percutaneous coronary angioplasty, s/p CABG x 6   . ESRD (end stage renal disease) ([place])   . Heart attack   . History of radiation therapy    for prostate CA  . Hyperlipidemia   . Hyperparathyroidism, secondary renal ([place])   . Hypertension   . Osteoarthritis   . Polycystic kidney disease   . Prostate CA  [facility]) 2012   Brachytherapy  . Renal insufficiency    currently on dialysis  . Shingles     Medications:  Infusions:  . heparin 1,000 Units/hr (06/01/16 0021)    Assessment: 74 yom cc CP with positive troponin. Pharmacy consulted to dose heparin. No PTA AC listed, has not received LMWH yet. DC LWMH and start UFH.   Goal of Therapy:  Heparin level 0.3-0.7 units/ml Monitor platelets by anticoagulation protocol: Yes   Plan:  Current orders for heparin 1000 units/hr. Heparin level subtherapeutic.  Will give heparin bolus 2300 units and increase rate to 1300 units/hr. Recheck HL in 8 hours, CBC with AM labs.   Kenidi Elenbaas C, Pharm.D., BCPS Clinical Pharmacist 06/01/2016,10:56 AM

## 2016-06-02 ENCOUNTER — Inpatient Hospital Stay
Admit: 2016-06-02 | Discharge: 2016-06-02 | Disposition: A | Discharge status: Home / Self Care | Payer: Medicare Other | Attending: Cardiology | Admitting: Cardiology

## 2016-06-02 LAB — CBC
HCT: 25 % — ABNORMAL LOW (ref 40.0–52.0)
HEMOGLOBIN: 8.3 g/dL — AB (ref 13.0–18.0)
MCH: 29.5 pg (ref 26.0–34.0)
MCHC: 33.2 g/dL (ref 32.0–36.0)
MCV: 88.9 fL (ref 80.0–100.0)
Platelets: 130 10*3/uL — ABNORMAL LOW (ref 150–440)
RBC: 2.82 MIL/uL — ABNORMAL LOW (ref 4.40–5.90)
RDW: 17.5 % — AB (ref 11.5–14.5)
WBC: 5.3 10*3/uL (ref 3.8–10.6)

## 2016-06-02 LAB — ECHOCARDIOGRAM COMPLETE
Height: 70 in
Weight: 2721.6 oz

## 2016-06-02 LAB — HEPARIN LEVEL (UNFRACTIONATED)
HEPARIN UNFRACTIONATED: 0.38 [IU]/mL (ref 0.30–0.70)
Heparin Unfractionated: 0.33 IU/mL (ref 0.30–0.70)

## 2016-06-02 LAB — GLUCOSE, CAPILLARY: Glucose-Capillary: 112 mg/dL — ABNORMAL HIGH (ref 65–99)

## 2016-06-02 NOTE — Progress Notes (Addendum)
ANTICOAGULATION CONSULT NOTE - Follow Up  Pharmacy Consult for Heparin Indication: chest pain/ACS  Allergies  Allergen Reactions  . Orange Fruit [Citrus] Hives  . Penicillins Hives    Has patient had a PCN reaction causing immediate rash, facial/tongue/throat swelling, SOB or lightheadedness with hypotension: Yes Has patient had a PCN reaction causing severe rash involving mucus membranes or skin necrosis: No Has patient had a PCN reaction that required hospitalization No Has patient had a PCN reaction occurring within the last 10 years: No If all of the above answers are "NO", then may proceed with Cephalosporin use.    Patient Measurements: Height: 5\' 10"  (177.8 cm) Weight: 170 lb 1.6 oz (77.2 kg) IBW/kg (Calculated) : 73 Heparin Dosing Weight: 80 kg  Vital Signs: Temp: 98.2 F (36.8 C) (12/10 1047) Temp Source: Oral (12/10 1047) BP: 118/53 (12/10 1047) Pulse Rate: 68 (12/10 1047)  Labs:  Recent Labs  05/31/16 1815 05/31/16 2236 06/01/16 0018 06/01/16 0214 06/01/16 0611  06/01/16 1904 06/02/16 0605 06/02/16 1403  HGB 9.4*  --   --   --  8.6*  --   --  8.3*  --   HCT 28.6*  --   --   --  26.1*  --   --  25.0*  --   PLT 127*  --   --   --  125*  --   --  130*  --   APTT  --   --  41*  --   --   --   --   --   --   LABPROT  --   --  15.2  --   --   --   --   --   --   INR  --   --  1.19  --   --   --   --   --   --   HEPARINUNFRC  --   --   --   --   --   < > 0.22* 0.33 0.38  CREATININE 2.85*  --   --   --  3.89*  --   --   --   --   TROPONINI <0.03 0.11*  --  0.77* 1.70*  --   --   --   --   < > = values in this interval not displayed.  Estimated Creatinine Clearance: 17.5 mL/min (by C-G formula based on SCr of 3.89 mg/dL (H)).  Medications:  Infusions:  . heparin 1,450 Units/hr (06/02/16 1051)    Assessment: 74 yom cc CP with positive troponin. Pharmacy consulted to dose heparin for ACS. Patient was not taking anticoagulants prior to admission.  Goal of  Therapy:  Heparin level 0.3-0.7 units/ml Monitor platelets by anticoagulation protocol: Yes   Plan:  12/10 Confirmatory HL @ 1400 = 0.38 (therapeutic). Will continue heparin infusion at current rate of 1450 units/hr and recheck CBC/HL with AM labs tomorrow.  12/11 AM heparin level 0.34. Continue current regimen. Recheck CBC and heparin level with tomorrow AM labs.  Lenis Noon, PharmD, BCPS 06/02/2016 2:51 PM

## 2016-06-02 NOTE — Progress Notes (Signed)
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   Objective:  Vital signs in last 24 hours:  Temp:  [98.2 F (36.8 C)-98.6 F (37 C)] 98.2 F (36.8 C) (12/10 1047) Pulse Rate:  [56-76] 68 (12/10 1047) Resp:  [16-18] 16 (12/10 1047) BP: (118-140)/(26-55) 118/53 (12/10 1047) SpO2:  [95 %-98 %] 97 % (12/10 1047)  Weight change:  Filed Weights   05/31/16 1804 05/31/16 2109  Weight: 80.7 kg (178 lb) 77.2 kg (170 lb 1.6 oz)    Intake/Output: I/O last 3 completed shifts: In: 31 [P.O.:360; I.V.:195] Out: 0    Intake/Output this shift:  Total I/O In: 360 [P.O.:360] Out: -   Physical Exam: General: NAD,   Head: Normocephalic, atraumatic. Moist oral mucosal membranes  Eyes: Anicteric, PERRL  Neck: Supple, trachea midline  Lungs:  Clear to auscultation  Heart: Regular rate and rhythm  Abdomen:  Soft, nontender,   Extremities: no peripheral edema.  Neurologic: Nonfocal, moving all four extremities  Skin: No lesions  Access: AVF - with staples and some serous anginous oozing    Basic Metabolic Panel:  Recent Labs Lab 05/31/16 1815 06/01/16 0611  NA 139 139  K 3.7 4.2  CL 99* 100*  CO2 33* 30  GLUCOSE 159* 72  BUN 19 27*  CREATININE 2.85* 3.89*  CALCIUM 8.2* 8.0*    Liver Function Tests:  Recent Labs Lab 05/31/16 1815  AST 13*  ALT 6*  ALKPHOS 87  BILITOT 0.7  PROT 6.4*  ALBUMIN 3.2*   No results for input(s): LIPASE, AMYLASE in the last 168 hours. No results for input(s): AMMONIA in the last 168 hours.  CBC:  Recent Labs Lab 05/31/16 1815 06/01/16 0611 06/02/16 0605  WBC 6.4 5.6 5.3  HGB 9.4* 8.6* 8.3*  HCT 28.6* 26.1* 25.0*  MCV 88.7 87.6 88.9  PLT 127* 125* 130*    Cardiac Enzymes:  Recent Labs Lab 05/31/16 1815 05/31/16 2236 06/01/16 0214 06/01/16 0611  TROPONINI <0.03 0.11* 0.77* 1.70*    BNP: Invalid input(s): POCBNP  CBG: No results for  input(s): GLUCAP in the last 168 hours.  Microbiology: Results for orders placed or performed in visit on 02/25/13  Body fluid culture     Status: None   Collection Time: 02/25/13  9:00 AM  Result Value Ref Range Status   Micro Text Report   Final       SOURCE: LEFT KNEE    COMMENT                   NO GROWTH AEROBICALLY/ANAEROBICALLY IN 4 DAYS   GRAM STAIN                MANY WHITE BLOOD CELLS   GRAM STAIN                NO ORGANISMS SEEN   ANTIBIOTIC                                                        Coagulation Studies:  Recent Labs  06/01/16 0018  LABPROT 15.2  INR 1.19    Urinalysis: No results for input(s): COLORURINE, LABSPEC, PHURINE, GLUCOSEU, HGBUR, BILIRUBINUR, KETONESUR, PROTEINUR, UROBILINOGEN, NITRITE, LEUKOCYTESUR in the last 72 hours.  Invalid input(s): APPERANCEUR    Imaging: Dg Chest Portable 1 View  Result Date: 05/31/2016 CLINICAL DATA:  75 y/o  M; chest pain. EXAM: PORTABLE CHEST 1 VIEW COMPARISON:  07/21/2015 chest radiograph. FINDINGS: Stable cardiomegaly given projection and technique. Aortic atherosclerosis with arch calcification. Aortic valve replacement. Post median sternotomy with wires aligned. Small bilateral pleural effusions and associated bibasilar opacities which probably represent atelectasis. Interstitial prominence may reflect mild interstitial pulmonary edema. Right proximal humerus rotator cuff anchor partially visualized. Bones are otherwise unremarkable. IMPRESSION: Small bilateral pleural effusions and associated bibasilar opacities which probably represent atelectasis. Pneumonia is not excluded. Interstitial prominence, likely interstitial pulmonary edema. Electronically Signed   By: Kristine Garbe M.D.   On: 05/31/2016 18:37     Medications:   . heparin 1,450 Units/hr (06/02/16 1051)   . cholecalciferol  1,000 Units Oral QHS  . cinacalcet  90 mg Oral Q breakfast  . clopidogrel  75 mg Oral QHS  . isosorbide  mononitrate  60 mg Oral QHS  . metoprolol tartrate  12.5 mg Oral BID  . rosuvastatin  10 mg Oral QHS  . sevelamer carbonate  2,400 mg Oral TID WC  . sodium chloride flush  3 mL Intravenous Q12H  . tamsulosin  0.4 mg Oral QHS  . traZODone  50 mg Oral QHS   acetaminophen **OR** acetaminophen, morphine injection, nitroGLYCERIN, ondansetron **OR** ondansetron (ZOFRAN) IV  Assessment/ Plan:  [name] is a 75 y.o. white male with hypothyroidism, hypertension, BPH, CAD status post CABG, hyperlipidemia.   [facility] [place] MWF  1. End Stage Renal Disease: with complication of dialysis device.  - Continue MWF schedule.  - dialysis after cardiac catheterization on Monday - Patient to direct where to use his access, seems that outpatient is sticking needles proximal to the incision site.   2. Hypertension: blood pressure well controlled.  - Imdur and tamsulosin  3. Secondary Hyperparathyroidism:  - sevelamer with meals - continue cinacalcet.   4. Anemia of chronic kidney disease: hemoglobin 8.3 - with ischemia, will hold epo. Gets micera as outpatient.    LOS: [place], [place] 12/10/201711:27 AM

## 2016-06-02 NOTE — Progress Notes (Signed)
[facility] [place] NAME: Jimmy Carson    MR#:  [id number]  DATE OF BIRTH:  Jan 18, 1941  SUBJECTIVE:   Patient is here due to chest pain, has ruled in for a non-ST elevation MI. Plan for cardiac catheterization tomorrow.  Currently CP free.  No acute events overnight.    REVIEW OF SYSTEMS:    Review of Systems  Constitutional: Negative for chills and fever.  HENT: Negative for congestion and tinnitus.   Eyes: Negative for blurred vision and double vision.  Respiratory: Negative for cough, shortness of breath and wheezing.   Cardiovascular: Negative for chest pain, orthopnea and PND.  Gastrointestinal: Negative for abdominal pain, diarrhea, nausea and vomiting.  Genitourinary: Negative for dysuria and hematuria.  Neurological: Negative for dizziness, sensory change and focal weakness.  All other systems reviewed and are negative.   Nutrition: Heart/Renal Tolerating Diet: Yes Tolerating PT: Ambulatory  DRUG ALLERGIES:   Allergies  Allergen Reactions  . Orange Fruit [Citrus] Hives  . Penicillins Hives    Has patient had a PCN reaction causing immediate rash, facial/tongue/throat swelling, SOB or lightheadedness with hypotension: Yes Has patient had a PCN reaction causing severe rash involving mucus membranes or skin necrosis: No Has patient had a PCN reaction that required hospitalization No Has patient had a PCN reaction occurring within the last 10 years: No If all of the above answers are "NO", then may proceed with Cephalosporin use.     VITALS:  Blood pressure (!) 118/53, pulse 68, temperature 98.2 F (36.8 C), temperature source Oral, resp. rate 16, height 5\' 10"  (1.778 m), weight 77.2 kg (170 lb 1.6 oz), SpO2 97 %.  PHYSICAL EXAMINATION:   Physical Exam  GENERAL:  75 y.o.-year-old patient lying in the bed in no acute distress.  EYES: Pupils equal, round, reactive to light and accommodation. No scleral icterus.  Extraocular muscles intact.  HEENT: Head atraumatic, normocephalic. Oropharynx and nasopharynx clear.  NECK:  Supple, no jugular venous distention. No thyroid enlargement, no tenderness.  LUNGS: Normal breath sounds bilaterally, no wheezing, rales, rhonchi. No use of accessory muscles of respiration.  CARDIOVASCULAR: S1, S2 normal. II/VI SEM at LSB, No rubs, or gallops.  ABDOMEN: Soft, nontender, nondistended. Bowel sounds present. No organomegaly or mass.  EXTREMITIES: No cyanosis, clubbing or edema b/l.    NEUROLOGIC: Cranial nerves II through XII are intact. No focal Motor or sensory deficits b/l.   PSYCHIATRIC: The patient is alert and oriented x 3.  SKIN: No obvious rash, lesion, or ulcer.   Left upper ext. AV fistula with good bruit and thrill.   LABORATORY PANEL:   CBC  Recent Labs Lab 06/02/16 0605  WBC 5.3  HGB 8.3*  HCT 25.0*  PLT 130*   ------------------------------------------------------------------------------------------------------------------  Chemistries   Recent Labs Lab 05/31/16 1815 06/01/16 0611  NA 139 139  K 3.7 4.2  CL 99* 100*  CO2 33* 30  GLUCOSE 159* 72  BUN 19 27*  CREATININE 2.85* 3.89*  CALCIUM 8.2* 8.0*  AST 13*  --   ALT 6*  --   ALKPHOS 87  --   BILITOT 0.7  --    ------------------------------------------------------------------------------------------------------------------  Cardiac Enzymes  Recent Labs Lab 06/01/16 0611  TROPONINI 1.70*   ------------------------------------------------------------------------------------------------------------------  RADIOLOGY:  Dg Chest Portable 1 View  Result Date: 05/31/2016 CLINICAL DATA:  75 y/o  M; chest pain.. EXAM: PORTABLE CHEST 1 VIEW COMPARISON:  07/21/2015 chest radiograph. FINDINGS: Stable cardiomegaly given projection and technique.  Aortic atherosclerosis with arch calcification. Aortic valve replacement. Post median sternotomy with wires aligned. Small bilateral pleural  effusions and associated bibasilar opacities which probably represent atelectasis. Interstitial prominence may reflect mild interstitial pulmonary edema. Right proximal humerus rotator cuff anchor partially visualized. Bones are otherwise unremarkable. IMPRESSION: Small bilateral pleural effusions and associated bibasilar opacities which probably represent atelectasis. Pneumonia is not excluded. Interstitial prominence, likely interstitial pulmonary edema. Electronically Signed   By: Kristine Garbe M.D.   On: 05/31/2016 18:37     ASSESSMENT AND PLAN:   75 year old male with past medical history of end-stage renal disease on hemodialysis, adult polycystic kidney disease, hypertension, secondary hyperparathyroidism, history of coronary artery disease status post stent placement who presents to the hospital due to chest pain.   1. NSTEMI- pt. Has ruled in by cardiac markers.   - cont. ASA, Heparin gtt, B-blocker, Statin, Plavix.  - appreciate Cardiology consult and plan for cardiac cath tomorrow. - await Echo results.   2. Essential hypertension-continue imdur, Metoprolol.    3. ESRD on HD - nephrology consulted as pt. To have cath on Monday. Will get HD after cath tomorrow.  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 Cont. Further care as per Nephro. - dialysis on MWF.   4. Secondary Hyperparathyroidism - cont. Sensipar, Renvela.   5. BPH - cont. Flomax.   6. Hyperlipidemia - cont. Crestor  Possible d/c home after cardiac cath tomorrow if it's (-) and after HD.   All the records are reviewed and case discussed with Care Management/Social Worker. Management plans discussed with the patient, family and they are in agreement.  CODE STATUS: Full code  DVT Prophylaxis: heparin gtt  TOTAL TIME TAKING CARE OF THIS PATIENT: 25 minutes.   POSSIBLE D/C IN 1-2 DAYS, DEPENDING ON CLINICAL CONDITION.   Henreitta Leber M.D on 06/02/2016 at 11:35  AM  Between 7am to 6pm - Pager - [phone]  After 6pm go to [email or web address] - Proofreader  [facility]  Office  [phone]  CC: Primary care physician; Dion Body, MD

## 2016-06-02 NOTE — Progress Notes (Signed)
ANTICOAGULATION CONSULT NOTE - Follow Up Consult  Pharmacy Consult for Heparin Indication: chest pain/ACS  Allergies  Allergen Reactions  . Orange Fruit [Citrus] Hives  . Penicillins Hives    Has patient had a PCN reaction causing immediate rash, facial/tongue/throat swelling, SOB or lightheadedness with hypotension: Yes Has patient had a PCN reaction causing severe rash involving mucus membranes or skin necrosis: No Has patient had a PCN reaction that required hospitalization No Has patient had a PCN reaction occurring within the last 10 years: No If all of the above answers are "NO", then may proceed with Cephalosporin use.     Patient Measurements: Height: 5\' 10"  (177.8 cm) Weight: 170 lb 1.6 oz (77.2 kg) IBW/kg (Calculated) : 73 Heparin Dosing Weight: 77.2 kg  Vital Signs: Temp: 98.6 F (37 C) (12/10 0458) Temp Source: Oral (12/10 0458) BP: 137/26 (12/10 0458) Pulse Rate: 56 (12/10 0458)  Labs:  Recent Labs  05/31/16 1815 05/31/16 2236 06/01/16 0018 06/01/16 0214 06/01/16 5053 06/01/16 0915 06/01/16 1904 06/02/16 0605  HGB 9.4*  --   --   --  8.6*  --   --  8.3*  HCT 28.6*  --   --   --  26.1*  --   --  25.0*  PLT 127*  --   --   --  125*  --   --  130*  APTT  --   --  41*  --   --   --   --   --   LABPROT  --   --  15.2  --   --   --   --   --   INR  --   --  1.19  --   --   --   --   --   HEPARINUNFRC  --   --   --   --   --  0.10* 0.22* 0.33  CREATININE 2.85*  --   --   --  3.89*  --   --   --   TROPONINI <0.03 0.11*  --  0.77* 1.70*  --   --   --     Estimated Creatinine Clearance: 17.5 mL/min (by C-G formula based on SCr of 3.89 mg/dL (H)).   Medications:  Infusions:  . heparin 1,450 Units/hr (06/01/16 2019)    Assessment: 74 yom cc CP with positive troponin. Pharmacy consulted to dose heparin  12/10 0500 HL therapeutic  Goal of Therapy:  Heparin level 0.3-0.7 units/ml Monitor platelets by anticoagulation protocol: Yes   Plan:  Will  continue with current rate, recheck HL in 8 hour to confirm therapeutic. Next CBC in AM. Will continue to follow.  Paulina Fusi, PharmD, BCPS 06/02/2016 6:42 AM

## 2016-06-02 NOTE — Progress Notes (Signed)
[facility] Cardiology  SUBJECTIVE: I don't have chest pain   Vitals:   06/01/16 1136 06/01/16 1939 06/02/16 0458 06/02/16 1047  BP: (!) 134/55 (!) 140/30 (!) 137/26 (!) 118/53  Pulse: 76 66 (!) 56 68  Resp: 16 18 18 16   Temp: 98.2 F (36.8 C) 98.3 F (36.8 C) 98.6 F (37 C) 98.2 F (36.8 C)  TempSrc: Oral  Oral Oral  SpO2: 95% 98% 97% 97%  Weight:      Height:         Intake/Output Summary (Last 24 hours) at 06/02/16 1115 Last data filed at 06/02/16 0900  Gross per 24 hour  Intake           554.95 ml  Output                0 ml  Net           554.95 ml      PHYSICAL EXAM  General: Well developed, well nourished, in no acute distress HEENT:  Normocephalic and atramatic Neck:  No JVD.  Lungs: Clear bilaterally to auscultation and percussion. Heart: HRRR . Normal S1 and S2 without gallops or murmurs.  Abdomen: Bowel sounds are positive, abdomen soft and non-tender  Msk:  Back normal, normal gait. Normal strength and tone for age. Extremities: No clubbing, cyanosis or edema.   Neuro: Alert and oriented X 3. Psych:  Good affect, responds appropriately   LABS: Basic Metabolic Panel:  Recent Labs  05/31/16 1815 06/01/16 0611  NA 139 139  K 3.7 4.2  CL 99* 100*  CO2 33* 30  GLUCOSE 159* 72  BUN 19 27*  CREATININE 2.85* 3.89*  CALCIUM 8.2* 8.0*   Liver Function Tests:  Recent Labs  05/31/16 1815  AST 13*  ALT 6*  ALKPHOS 87  BILITOT 0.7  PROT 6.4*  ALBUMIN 3.2*   No results for input(s): LIPASE, AMYLASE in the last 72 hours. CBC:  Recent Labs  06/01/16 0611 06/02/16 0605  WBC 5.6 5.3  HGB 8.6* 8.3*  HCT 26.1* 25.0*  MCV 87.6 88.9  PLT 125* 130*   Cardiac Enzymes:  Recent Labs  05/31/16 2236 06/01/16 0214 06/01/16 0611  TROPONINI 0.11* 0.77* 1.70*   BNP: Invalid input(s): POCBNP D-Dimer: No results for input(s): DDIMER in the last 72 hours. Hemoglobin A1C: No results for input(s): HGBA1C in the last 72 hours. Fasting Lipid Panel: No  results for input(s): CHOL, HDL, LDLCALC, TRIG, CHOLHDL, LDLDIRECT in the last 72 hours. Thyroid Function Tests: No results for input(s): TSH, T4TOTAL, T3FREE, THYROIDAB in the last 72 hours.  Invalid input(s): FREET3 Anemia Panel: No results for input(s): VITAMINB12, FOLATE, FERRITIN, TIBC, IRON, RETICCTPCT in the last 72 hours.  Dg Chest Portable 1 View  Result Date: 05/31/2016 CLINICAL DATA:  75 y/o  M; chest pain. EXAM: PORTABLE CHEST 1 VIEW COMPARISON:  07/21/2015 chest radiograph. FINDINGS: Stable cardiomegaly given projection and technique. Aortic atherosclerosis with arch calcification. Aortic valve replacement. Post median sternotomy with wires aligned. Small bilateral pleural effusions and associated bibasilar opacities which probably represent atelectasis. Interstitial prominence may reflect mild interstitial pulmonary edema. Right proximal humerus rotator cuff anchor partially visualized. Bones are otherwise unremarkable. IMPRESSION: Small bilateral pleural effusions and associated bibasilar opacities which probably represent atelectasis. Pneumonia is not excluded. Interstitial prominence, likely interstitial pulmonary edema. Electronically Signed   By: Kristine Garbe M.D.   On: 05/31/2016 18:37     Echo pending  TELEMETRY: Sinus rhythm:  ASSESSMENT AND PLAN:  Active Problems:   Chest pain   NSTEMI (non-ST elevated myocardial infarction) ([place])    1. Non-STEMI, on heparin drip 2. Known CAD, status post CABG 6 ( LIMA to LAD, SVG to D1/D2, SVG to ramus, OM1, PL 08/30/2011 ) 3. History of aortic stenosis, status post TAVR  Recommendations  1. Continue current medications 2. Continue heparin drip 3. Review 2-D echocardiogram 4. Cardiac catheterization with selective coronary arteriography on 06/03/2016. The risks, benefits and alternatives of cardiac catheterization were explained to the patient and informed written consent was obtained   Isaias Cowman, MD,  PhD, [facility] 06/02/2016 11:15 AM

## 2016-06-02 NOTE — Consult Note (Signed)
Reason for Consult: Left ARm AV fistula Incisional bleeding, S/P revision Referring Physician: Dr. Melynda Ripple Shiffler is an 75 y.o. male.  HPI: Patient with ESRD on HD via a left arm AV fistula MWF. Patient has had numerous AV fistulas managed in [facility]. 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 He presents with a NSTEMI placed on Hep gtt and now has oozing from the incision. He has had successful HD proximal to the incision on Friday. He denies pain or discomfort in the arm.  Past Medical History:  Diagnosis Date  . Anemia   . CAD (coronary artery disease)    s/p percutaneous coronary angioplasty, s/p CABG x 6   . ESRD (end stage renal disease) ([place])   . Heart attack   . History of radiation therapy    for prostate CA  . Hyperlipidemia   . Hyperparathyroidism, secondary renal ([place])   . Hypertension   . Osteoarthritis   . Polycystic kidney disease   . Prostate CA [facility]) 2012   Brachytherapy  . Renal insufficiency    currently on dialysis  . Shingles     Past Surgical History:  Procedure Laterality Date  . AORTIC VALVE REPLACEMENT    . CARDIAC CATHETERIZATION N/A 07/24/2015   Procedure: Coronary/Graft Angiography;  Surgeon: Corey Skains, MD;  Location: [facility];  Service: Cardiovascular;  Laterality: N/A;  . CORONARY ARTERY BYPASS GRAFT    . JOINT REPLACEMENT    . REPLACEMENT TOTAL KNEE BILATERAL    . ROTATOR CUFF REPAIR Right     Family History  Problem Relation Age of Onset  . Polycystic kidney disease Father   . Aneurysm Father     Social History:  reports that he quit smoking about 3 years ago. He has never used smokeless tobacco. He reports that he does not drink alcohol. His drug history is not on file.  Allergies:  Allergies  Allergen Reactions  . Orange Fruit [Citrus] Hives  . Penicillins Hives    Has patient had a PCN reaction causing immediate rash,  facial/tongue/throat swelling, SOB or lightheadedness with hypotension: Yes Has patient had a PCN reaction causing severe rash involving mucus membranes or skin necrosis: No Has patient had a PCN reaction that required hospitalization No Has patient had a PCN reaction occurring within the last 10 years: No If all of the above answers are "NO", then may proceed with Cephalosporin use.     Medications: I have reviewed the patient's current medications.  Results for orders placed or performed during the hospital encounter of 05/31/16 (from the past 48 hour(s))  CBC     Status: Abnormal   Collection Time: 05/31/16  6:15 PM  Result Value Ref Range   WBC 6.4 3.8 - 10.6 K/uL   RBC 3.22 (L) 4.40 - 5.90 MIL/uL   Hemoglobin 9.4 (L) 13.0 - 18.0 g/dL   HCT 28.6 (L) 40.0 - 52.0 %   MCV 88.7 80.0 - 100.0 fL   MCH 29.2 26.0 - 34.0 pg   MCHC 32.9 32.0 - 36.0 g/dL   RDW 17.0 (H) 11.5 - 14.5 %   Platelets 127 (L) 150 - 440 K/uL  Comprehensive metabolic panel     Status: Abnormal   Collection Time: 05/31/16  6:15 PM  Result Value Ref Range   Sodium 139 135 - 145 mmol/L   Potassium 3.7 3.5 - 5.1 mmol/L   Chloride 99 (L)  101 - 111 mmol/L   CO2 33 (H) 22 - 32 mmol/L   Glucose, Bld 159 (H) 65 - 99 mg/dL   BUN 19 6 - 20 mg/dL   Creatinine, Ser 2.85 (H) 0.61 - 1.24 mg/dL   Calcium 8.2 (L) 8.9 - 10.3 mg/dL   Total Protein 6.4 (L) 6.5 - 8.1 g/dL   Albumin 3.2 (L) 3.5 - 5.0 g/dL   AST 13 (L) 15 - 41 U/L   ALT 6 (L) 17 - 63 U/L   Alkaline Phosphatase 87 38 - 126 U/L   Total Bilirubin 0.7 0.3 - 1.2 mg/dL   GFR calc non Af Amer 20 (L) >60 mL/min   GFR calc Af Amer 24 (L) >60 mL/min    Comment: (NOTE) The eGFR has been calculated using the CKD EPI equation. This calculation has not been validated in all clinical situations. eGFR's persistently <60 mL/min signify possible Chronic Kidney Disease.    Anion gap 7 5 - 15  Troponin I     Status: None   Collection Time: 05/31/16  6:15 PM  Result Value  Ref Range   Troponin I <0.03 <0.03 ng/mL  Troponin I     Status: Abnormal   Collection Time: 05/31/16 10:36 PM  Result Value Ref Range   Troponin I 0.11 (HH) <0.03 ng/mL    Comment: CRITICAL RESULT CALLED TO, READ BACK BY AND VERIFIED WITH CHARLOTTE KYEI AT 2321 ON 05/31/16 RWW   APTT     Status: Abnormal   Collection Time: 06/01/16 12:18 AM  Result Value Ref Range   aPTT 41 (H) 24 - 36 seconds    Comment:        IF BASELINE aPTT IS ELEVATED, SUGGEST PATIENT RISK ASSESSMENT BE USED TO DETERMINE APPROPRIATE ANTICOAGULANT THERAPY.   Protime-INR     Status: None   Collection Time: 06/01/16 12:18 AM  Result Value Ref Range   Prothrombin Time 15.2 11.4 - 15.2 seconds   INR 1.19   Troponin I     Status: Abnormal   Collection Time: 06/01/16  2:14 AM  Result Value Ref Range   Troponin I 0.77 (HH) <0.03 ng/mL    Comment: CRITICAL RESULT CALLED TO, READ BACK BY AND VERIFIED WITH ADRIANNE WHITE AT 6314 06/01/16.PMH  Basic metabolic panel     Status: Abnormal   Collection Time: 06/01/16  6:11 AM  Result Value Ref Range   Sodium 139 135 - 145 mmol/L   Potassium 4.2 3.5 - 5.1 mmol/L   Chloride 100 (L) 101 - 111 mmol/L   CO2 30 22 - 32 mmol/L   Glucose, Bld 72 65 - 99 mg/dL   BUN 27 (H) 6 - 20 mg/dL   Creatinine, Ser 3.89 (H) 0.61 - 1.24 mg/dL   Calcium 8.0 (L) 8.9 - 10.3 mg/dL   GFR calc non Af Amer 14 (L) >60 mL/min   GFR calc Af Amer 16 (L) >60 mL/min    Comment: (NOTE) The eGFR has been calculated using the CKD EPI equation. This calculation has not been validated in all clinical situations. eGFR's persistently <60 mL/min signify possible Chronic Kidney Disease.    Anion gap 9 5 - 15  CBC     Status: Abnormal   Collection Time: 06/01/16  6:11 AM  Result Value Ref Range   WBC 5.6 3.8 - 10.6 K/uL   RBC 2.98 (L) 4.40 - 5.90 MIL/uL   Hemoglobin 8.6 (L) 13.0 - 18.0 g/dL   HCT 26.1 (L) 40.0 -  52.0 %   MCV 87.6 80.0 - 100.0 fL   MCH 29.0 26.0 - 34.0 pg   MCHC 33.1 32.0 - 36.0  g/dL   RDW 17.6 (H) 11.5 - 14.5 %   Platelets 125 (L) 150 - 440 K/uL  Troponin I     Status: Abnormal   Collection Time: 06/01/16  6:11 AM  Result Value Ref Range   Troponin I 1.70 (HH) <0.03 ng/mL    Comment: CRITICAL VALUE NOTED. VALUE IS CONSISTENT WITH PREVIOUSLY REPORTED/CALLED VALUE. TCH,  Heparin level (unfractionated)     Status: Abnormal   Collection Time: 06/01/16  9:15 AM  Result Value Ref Range   Heparin Unfractionated 0.10 (L) 0.30 - 0.70 IU/mL    Comment:        IF HEPARIN RESULTS ARE BELOW EXPECTED VALUES, AND PATIENT DOSAGE HAS BEEN CONFIRMED, SUGGEST FOLLOW UP TESTING OF ANTITHROMBIN III LEVELS.   Heparin level (unfractionated)     Status: Abnormal   Collection Time: 06/01/16  7:04 PM  Result Value Ref Range   Heparin Unfractionated 0.22 (L) 0.30 - 0.70 IU/mL    Comment:        IF HEPARIN RESULTS ARE BELOW EXPECTED VALUES, AND PATIENT DOSAGE HAS BEEN CONFIRMED, SUGGEST FOLLOW UP TESTING OF ANTITHROMBIN III LEVELS.   CBC     Status: Abnormal   Collection Time: 06/02/16  6:05 AM  Result Value Ref Range   WBC 5.3 3.8 - 10.6 K/uL   RBC 2.82 (L) 4.40 - 5.90 MIL/uL   Hemoglobin 8.3 (L) 13.0 - 18.0 g/dL   HCT 25.0 (L) 40.0 - 52.0 %   MCV 88.9 80.0 - 100.0 fL   MCH 29.5 26.0 - 34.0 pg   MCHC 33.2 32.0 - 36.0 g/dL   RDW 17.5 (H) 11.5 - 14.5 %   Platelets 130 (L) 150 - 440 K/uL  Heparin level (unfractionated)     Status: None   Collection Time: 06/02/16  6:05 AM  Result Value Ref Range   Heparin Unfractionated 0.33 0.30 - 0.70 IU/mL    Comment:        IF HEPARIN RESULTS ARE BELOW EXPECTED VALUES, AND PATIENT DOSAGE HAS BEEN CONFIRMED, SUGGEST FOLLOW UP TESTING OF ANTITHROMBIN III LEVELS.   Heparin level (unfractionated)     Status: None   Collection Time: 06/02/16  2:03 PM  Result Value Ref Range   Heparin Unfractionated 0.38 0.30 - 0.70 IU/mL    Comment:        IF HEPARIN RESULTS ARE BELOW EXPECTED VALUES, AND PATIENT DOSAGE HAS BEEN  CONFIRMED, SUGGEST FOLLOW UP TESTING OF ANTITHROMBIN III LEVELS.     Dg Chest Portable 1 View  Result Date: 05/31/2016 CLINICAL DATA:  74 y/o  M; chest pain. EXAM: PORTABLE CHEST 1 VIEW COMPARISON:  07/21/2015 chest radiograph. FINDINGS: Stable cardiomegaly given projection and technique. Aortic atherosclerosis with arch calcification. Aortic valve replacement. Post median sternotomy with wires aligned. Small bilateral pleural effusions and associated bibasilar opacities which probably represent atelectasis. Interstitial prominence may reflect mild interstitial pulmonary edema. Right proximal humerus rotator cuff anchor partially visualized. Bones are otherwise unremarkable. IMPRESSION: Small bilateral pleural effusions and associated bibasilar opacities which probably represent atelectasis. Pneumonia is not excluded. Interstitial prominence, likely interstitial pulmonary edema. Electronically Signed   By: Kristine Garbe M.D.   On: 05/31/2016 18:37    Review of Systems  Constitutional: Negative.   HENT: Negative.   Respiratory: Negative for cough and shortness of breath.   Cardiovascular: Positive for  chest pain.       Left Arm: Notes chronic left arm edema, denies pain or discomfort  Gastrointestinal: Negative.   Genitourinary: Negative.   Musculoskeletal: Negative.   Skin: Negative.   Neurological: Negative.   Endo/Heme/Allergies: Negative.    Blood pressure (!) 118/53, pulse 68, temperature 98.2 F (36.8 C), temperature source Oral, resp. rate 16, height 5' 10"  (1.778 m), weight 77.2 kg (170 lb 1.6 oz), SpO2 97 %. Physical Exam  Vitals reviewed. Constitutional: He is oriented to person, place, and time. He appears well-developed and well-nourished.  HENT:  Head: Normocephalic.  Neck: Normal range of motion. No JVD present.  Cardiovascular: Normal rate, regular rhythm and intact distal pulses.   Left AV fistula: +thrill/+ Bruit, Palpable radial pulse. Patient with  previous left radial cephalic AV fistula- flow noted through fistula at wrist, hand warm, well perfused.  Respiratory: Effort normal and breath sounds normal. No stridor. No respiratory distress. He has no wheezes. He has no rales.  GI: Soft. He exhibits no distension. There is no tenderness.  Musculoskeletal: He exhibits edema. He exhibits no tenderness.  Left arm edema- Chronic Incision- clean, intact, mild oozing from distal and mid arm incision sites.   Neurological: He is alert and oriented to person, place, and time.  Skin: Skin is warm and dry. He is not diaphoretic. No erythema.    Assessment/Plan: Left AV fistula s/p revision in [facility] on 12/07 now with incisional oozing. Fistula is functioning well.  Oozing is likely postoperative exacerbated by Hep gtt. Monitor left Arm, continue dry dressings, if worsens please contact Vascular. May continue HD as scheduled.  If discharged tomorrow should follow with Operative surgeon in [facility]. Otherwise will follow daily.   Kelcy Laible A 06/02/2016, 2:29 PM

## 2016-06-03 ENCOUNTER — Encounter
Admission: EM | Disposition: A | Discharge status: Home / Self Care | Payer: Self-pay | Source: Home / Self Care | Attending: Specialist

## 2016-06-03 ENCOUNTER — Encounter: Payer: Self-pay | Admitting: Cardiology

## 2016-06-03 HISTORY — PX: CARDIAC CATHETERIZATION: SHX172

## 2016-06-03 LAB — RENAL FUNCTION PANEL
ANION GAP: 10 (ref 5–15)
Albumin: 3 g/dL — ABNORMAL LOW (ref 3.5–5.0)
BUN: 39 mg/dL — AB (ref 6–20)
CALCIUM: 7.9 mg/dL — AB (ref 8.9–10.3)
CO2: 27 mmol/L (ref 22–32)
Chloride: 99 mmol/L — ABNORMAL LOW (ref 101–111)
Creatinine, Ser: 5.52 mg/dL — ABNORMAL HIGH (ref 0.61–1.24)
GFR calc Af Amer: 11 mL/min — ABNORMAL LOW (ref >60)
GFR calc non Af Amer: 9 mL/min — ABNORMAL LOW (ref >60)
GLUCOSE: 102 mg/dL — AB (ref 65–99)
POTASSIUM: 4.8 mmol/L (ref 3.5–5.1)
Phosphorus: 4.2 mg/dL (ref 2.5–4.6)
SODIUM: 136 mmol/L (ref 135–145)

## 2016-06-03 LAB — CBC
HCT: 24.6 % — ABNORMAL LOW (ref 40.0–52.0)
HEMATOCRIT: 25.1 % — AB (ref 40.0–52.0)
HEMOGLOBIN: 8 g/dL — AB (ref 13.0–18.0)
HEMOGLOBIN: 8.2 g/dL — AB (ref 13.0–18.0)
MCH: 29.1 pg (ref 26.0–34.0)
MCH: 28.7 pg (ref 26.0–34.0)
MCHC: 32.5 g/dL (ref 32.0–36.0)
MCHC: 32.7 g/dL (ref 32.0–36.0)
MCV: 89.5 fL (ref 80.0–100.0)
MCV: 87.8 fL (ref 80.0–100.0)
PLATELETS: 136 10*3/uL — AB (ref 150–440)
Platelets: 134 10*3/uL — ABNORMAL LOW (ref 150–440)
RBC: 2.74 MIL/uL — AB (ref 4.40–5.90)
RBC: 2.86 MIL/uL — ABNORMAL LOW (ref 4.40–5.90)
RDW: 17.2 % — ABNORMAL HIGH (ref 11.5–14.5)
RDW: 17.1 % — ABNORMAL HIGH (ref 11.5–14.5)
WBC: 5.6 10*3/uL (ref 3.8–10.6)
WBC: 4.8 10*3/uL (ref 3.8–10.6)

## 2016-06-03 LAB — HEPARIN LEVEL (UNFRACTIONATED): HEPARIN UNFRACTIONATED: 0.34 [IU]/mL (ref 0.30–0.70)

## 2016-06-03 SURGERY — LEFT HEART CATH AND CORONARY ANGIOGRAPHY
Anesthesia: Moderate Sedation

## 2016-06-03 MED ORDER — FENTANYL CITRATE (PF) 100 MCG/2ML IJ SOLN
INTRAMUSCULAR | Status: DC | PRN
  Administered 2016-06-03: 25 ug via INTRAVENOUS

## 2016-06-03 MED ORDER — MIDAZOLAM HCL 2 MG/2ML IJ SOLN
INTRAMUSCULAR | Status: DC | PRN
  Administered 2016-06-03: 1 mg via INTRAVENOUS

## 2016-06-03 MED ORDER — METOPROLOL TARTRATE 25 MG PO TABS: 12.5000 mg | ORAL_TABLET | Freq: Every morning | ORAL | 0 refills | Status: DC

## 2016-06-03 MED ORDER — FENTANYL CITRATE (PF) 100 MCG/2ML IJ SOLN
INTRAMUSCULAR | Status: AC
  Filled 2016-06-03: qty 2

## 2016-06-03 MED ORDER — NITROGLYCERIN 0.4 MG SL SUBL: 0.4000 mg | SUBLINGUAL_TABLET | SUBLINGUAL | 0 refills | Status: AC | PRN

## 2016-06-03 MED ORDER — HEPARIN (PORCINE) IN NACL 2-0.9 UNIT/ML-% IJ SOLN
INTRAMUSCULAR | Status: AC
  Filled 2016-06-03: qty 500

## 2016-06-03 MED ORDER — MIDAZOLAM HCL 2 MG/2ML IJ SOLN
INTRAMUSCULAR | Status: AC
  Filled 2016-06-03: qty 2

## 2016-06-03 MED ORDER — ASPIRIN 81 MG PO CHEW
CHEWABLE_TABLET | ORAL | Status: AC
  Administered 2016-06-03: 81 mg via ORAL
  Filled 2016-06-03: qty 1

## 2016-06-03 MED ORDER — ASPIRIN 81 MG PO CHEW
81.0000 mg | CHEWABLE_TABLET | ORAL | Status: AC
  Administered 2016-06-03: 81 mg via ORAL

## 2016-06-03 SURGICAL SUPPLY — 10 items
CATH INFINITI 5 FR IM (CATHETERS) ×3 IMPLANT
CATH INFINITI 5FR ANG PIGTAIL (CATHETERS) ×3 IMPLANT
CATH INFINITI 5FR JL4 (CATHETERS) ×3 IMPLANT
CATH INFINITI JR4 5F (CATHETERS) ×6 IMPLANT
KIT MANI 3VAL PERCEP (MISCELLANEOUS) ×3 IMPLANT
NEEDLE PERC 18GX7CM (NEEDLE) ×3 IMPLANT
PACK CARDIAC CATH (CUSTOM PROCEDURE TRAY) ×3 IMPLANT
SHEATH AVANTI 5FR X 11CM (SHEATH) ×3 IMPLANT
WIRE EMERALD 3MM-J .035X150CM (WIRE) ×3 IMPLANT
WIRE EMERALD 3MM-J .035X260CM (WIRE) ×3 IMPLANT

## 2016-06-03 NOTE — Progress Notes (Signed)
HD initiated without issue via L AVF proximal to patient. 17g needles used for safety d/t anatomy and venous needle being very close to patient armpit. Patient has no complaints currently.

## 2016-06-03 NOTE — Discharge Planning (Signed)
Vascular did not have FU appt till 1/9@ 1415.  I spoke with Dr. Caesar Bookman nurse (Out-patient vascular) to inform of assessment of fistula site and concerns.  She will inform Dr. when he comes into office tomorrow and call patient to "fit in" as soon as possible.

## 2016-06-03 NOTE — Plan of Care (Signed)
Patient returned to floor.  Informed to remain flat till 11AM since had RT groin cath.  Family in room to assist and sensitive bed alarm placed to remind.  Nurse Aid also informed.

## 2016-06-03 NOTE — Progress Notes (Signed)
HD completed without issue. Goal 1.5L UF met. Patient currently has no complaints.

## 2016-06-03 NOTE — Discharge Planning (Signed)
Patient IV and tele removed.  Discharge papers given, explained and educated.  Informed of suggested FU appts and appts made.  Also told of scripts sent to [facility] in [place].  Patient had Cardiac cath and dialysis today, with 1.5L removed.  RN assessment and  VS revealed stability for DC to home.  Once ride arrives and patient ready, will wheel to front.  Family transporting home via car.

## 2016-06-03 NOTE — Progress Notes (Signed)
Pre HD assessment  

## 2016-06-03 NOTE — Progress Notes (Signed)
Post HD assessment  

## 2016-06-03 NOTE — Progress Notes (Signed)
Pre dialysis  

## 2016-06-03 NOTE — Progress Notes (Signed)
[facility]  ROUNDING NOTE   Subjective:   He had AVF aneurysm removed on 12/7 at [facility].  Had coronary angiography earlier today No intervention was necessary   HEMODIALYSIS FLOWSHEET:  Blood Flow Rate (mL/min): 400 mL/min Arterial Pressure (mmHg): 300 mmHg Venous Pressure (mmHg): 200 mmHg Transmembrane Pressure (mmHg): 40 mmHg Ultrafiltration Rate (mL/min): 570 mL/min Dialysate Flow Rate (mL/min): 600 ml/min Conductivity: Machine : 14 Conductivity: Machine : 14 Dialysis Fluid Bolus: Normal Saline Bolus Amount (mL): 250 mL Dialysate Change:  (3k 2.5ca) Intra-Hemodialysis Comments: 250cc prime, cannulated with 17g needles x2 d/t area of cannulation so close to patient armpit, smaller needles used for safety    Objective:  Vital signs in last 24 hours:  Temp:  [98.2 F (36.8 C)-98.9 F (37.2 C)] 98.9 F (37.2 C) (12/11 1218) Pulse Rate:  [59-69] 64 (12/11 1218) Resp:  [17-20] 20 (12/11 1218) BP: (119-143)/(59-78) 124/68 (12/11 1218) SpO2:  [93 %-100 %] 100 % (12/11 1218) Weight:  [77.1 kg (170 lb)] 77.1 kg (170 lb) (12/11 0716)  Weight change:  Filed Weights   05/31/16 1804 05/31/16 2109 06/03/16 0716  Weight: 80.7 kg (178 lb) 77.2 kg (170 lb 1.6 oz) 77.1 kg (170 lb)    Intake/Output: I/O last 3 completed shifts: In: 838 [P.O.:360; I.V.:478] Out: 400 [Urine:400]   Intake/Output this shift:  No intake/output data recorded.  Physical Exam: General: NAD,   Head: Normocephalic, atraumatic. Moist oral mucosal membranes  Eyes: Anicteric,    Neck: Supple, trachea midline  Lungs:  Clear to auscultation  Heart: Regular rate and rhythm  Abdomen:  Soft, nontender,   Extremities: no peripheral edema.  Neurologic: Nonfocal, moving all four extremities  Skin: No lesions  Access: AVF - with staples      Basic Metabolic Panel:  Recent Labs Lab 05/31/16 1815 06/01/16 0611  NA 139 139  K 3.7 4.2  CL 99* 100*  CO2 33* 30  GLUCOSE 159* 72  BUN 19 27*   CREATININE 2.85* 3.89*  CALCIUM 8.2* 8.0*    Liver Function Tests:  Recent Labs Lab 05/31/16 1815  AST 13*  ALT 6*  ALKPHOS 87  BILITOT 0.7  PROT 6.4*  ALBUMIN 3.2*   No results for input(s): LIPASE, AMYLASE in the last 168 hours. No results for input(s): AMMONIA in the last 168 hours.  CBC:  Recent Labs Lab 05/31/16 1815 06/01/16 0611 06/02/16 0605 06/03/16 0528  WBC 6.4 5.6 5.3 5.6  HGB 9.4* 8.6* 8.3* 8.0*  HCT 28.6* 26.1* 25.0* 24.6*  MCV 88.7 87.6 88.9 89.5  PLT 127* 125* 130* 136*    Cardiac Enzymes:  Recent Labs Lab 05/31/16 1815 05/31/16 2236 06/01/16 0214 06/01/16 0611  TROPONINI <0.03 0.11* 0.77* 1.70*    BNP: Invalid input(s): POCBNP  CBG:  Recent Labs Lab 06/02/16 2051  GLUCAP 112*    Microbiology: Results for orders placed or performed in visit on 02/25/13  Body fluid culture     Status: None   Collection Time: 02/25/13  9:00 AM  Result Value Ref Range Status   Micro Text Report   Final       SOURCE: LEFT KNEE    COMMENT                   NO GROWTH AEROBICALLY/ANAEROBICALLY IN 4 DAYS   GRAM STAIN                MANY WHITE BLOOD CELLS   GRAM STAIN  NO ORGANISMS SEEN   ANTIBIOTIC                                                        Coagulation Studies:  Recent Labs  06/01/16 0018  LABPROT 15.2  INR 1.19    Urinalysis: No results for input(s): COLORURINE, LABSPEC, PHURINE, GLUCOSEU, HGBUR, BILIRUBINUR, KETONESUR, PROTEINUR, UROBILINOGEN, NITRITE, LEUKOCYTESUR in the last 72 hours.  Invalid input(s): APPERANCEUR    Imaging: No results found.   Medications:    . cholecalciferol  1,000 Units Oral QHS  . cinacalcet  90 mg Oral Q breakfast  . clopidogrel  75 mg Oral QHS  . isosorbide mononitrate  60 mg Oral QHS  . metoprolol tartrate  12.5 mg Oral BID  . rosuvastatin  10 mg Oral QHS  . sevelamer carbonate  2,400 mg Oral TID WC  . sodium chloride flush  3 mL Intravenous Q12H  . tamsulosin  0.4  mg Oral QHS  . traZODone  50 mg Oral QHS   acetaminophen **OR** acetaminophen, morphine injection, nitroGLYCERIN, ondansetron **OR** ondansetron (ZOFRAN) IV  Assessment/ Plan:  Mr. Rusell Meneely is a 75 y.o. white male with hypothyroidism, hypertension, BPH, CAD status post CABG, hyperlipidemia.   [facility] [place] MWF  1. End Stage Renal Disease: with complication of dialysis device.  - Continue MWF schedule.  -  Patient seen during dialysis Tolerating well  -  Use 17 G needles  2. Hypertension: blood pressure well controlled.  - Imdur and tamsulosin  3. Secondary Hyperparathyroidism:  - sevelamer with meals - continue cinacalcet.   4. Anemia of chronic kidney disease: hemoglobin 8.3 - with ischemia, will hold epo. Gets micera as outpatient.    LOS: 2 Lesleigh Hughson 12/11/20172:41 PM

## 2016-06-03 NOTE — Discharge Instructions (Signed)
Coronary Artery Disease, Male Coronary artery disease (CAD) is a process in which the blood vessels of the heart (coronary arteries) become narrow or blocked. The narrowing or blockage can lead to decreased blood flow to the heart muscle (angina). Prolonged reduced blood flow can cause a heart attack (myocardial infarction, MI). Because CAD is the leading cause of death in men, it is important to understand what causes this condition and how it is treated. What are the causes? Atherosclerosis is the cause of CAD. This is the buildup of fat and cholesterol (plaque) on the inside of the arteries. Over time, the plaque may narrow or block the artery, and this will lessen blood flow to the heart. Plaque can also become weak and break off within a coronary artery to form a clot and cause a sudden blockage. What increases the risk? Many risk factors increase your chances of getting CAD, including:  High cholesterol levels.  High blood pressure (hypertension).  Tobacco use.  Diabetes.  Age. Men over age 87 are at a greater risk of CAD.  Gender. Men often develop CAD earlier in life than women.  Family history of CAD.  Obesity.  Lack of exercise.  A diet high in saturated fats. What are the signs or symptoms? Many people do not experience any symptoms during the early stages of CAD. As the condition progresses, symptoms may include:  Chest pain.  The pain can be described as a crushing or squeezing in the chest, or a tightness, pressure, fullness, or heaviness in the chest.  The pain can last more than a few minutes or can stop and recur.  Pain in the arms, neck, jaw, or back.  Unexplained heartburn or indigestion.  Shortness of breath.  Nausea.  Sudden cold sweats. Less common symptoms of CAD in men can include:  Fatigue.  Unexplained feelings of nervousness or anxiety.  Weakness.  Diarrhea.  Sudden light-headedness. How is this diagnosed? Tests to diagnose CAD may  include:  ECG (electrocardiogram).  Exercise stress test. This looks for signs of blockage when the heart is being exercised.  Pharmacologic stress test. This test looks for signs of blockage when the heart is being stressed with a medicine.  Blood tests.  Coronary angiogram. This is a procedure to look at the coronary arteries to see if there is any blockage. How is this treated? The treatment of CAD may include the following:  Healthy behavioral changes to reduce or control risk factors.  Medicine.  Coronary stenting.A stent helps to keep an artery open.  Coronary angioplasty. This procedure widens a narrowed or blocked artery.  Coronary arterybypass surgery. This will allow your blood to pass the blockage (bypass) to reach your heart. Follow these instructions at home:  Take medicines only as directed by your health care provider.  Do not take the following medicines unless your health care provider approves:  Nonsteroidal anti-inflammatory drugs (NSAIDs), such as ibuprofen, naproxen, or celecoxib.  Vitamin supplements that contain vitamin A, vitamin E, or both.  Manage other health conditions such as hypertension and diabetes as directed by your health care provider.  Follow a heart-healthy diet. A dietitian can help to educate you about healthy food options and changes.  Use healthy cooking methods such as roasting, grilling, broiling, baking, poaching, steaming, or stir-frying. Talk to a dietitian to learn more about healthy cooking methods.  Follow an exercise program approved by your health care provider.  Maintain a healthy weight. Lose weight as approved by your health care  provider.  Plan rest periods when fatigued.  Learn to manage stress.  Do not use any tobacco products, including cigarettes, chewing tobacco, or electronic cigarettes. If you need help quitting, ask your health care provider.  If you drink alcohol, and your health care provider  approves, limit your alcohol intake to no more than 1 drink per day. One drink equals 12 ounces of beer, 5 ounces of wine, or 1 ounces of hard liquor.  Stop illegal drug use.  Your health care provider may ask you to monitor your blood pressure. A blood pressure reading consists of a higher number over a lower number, such as 110 over 72, which is written as 110/72. Ideally, your blood pressure should be:  Below 140/90 if you have no other medical conditions.  Below 130/80 if you have diabetes or kidney disease.  Keep all follow-up visits as directed by your health care provider. This is important. Get help right away if:  You have pain in your chest, neck, arm, jaw, stomach, or back that lasts more than a few minutes, is recurring, or is unrelieved by taking medicine under your tongue (sublingualnitroglycerin).  You have profuse sweating without cause.  You have unexplained:  Heartburn or indigestion.  Shortness of breath or difficulty breathing.  Nausea or vomiting.  Fatigue.  Feelings of nervousness or anxiety.  Weakness.  Diarrhea.  You have sudden light-headedness or dizziness.  You faint. These symptoms may represent a serious problem that is an emergency. Do not wait to see if the symptoms will go away. Get medical help right away. Call your local emergency services (911 in the [place] Do not drive yourself to the hospital.  This information is not intended to replace advice given to you by your health care provider. Make sure you discuss any questions you have with your health care provider. Document Released: 01/05/2014 Document Revised: 11/16/2015 Document Reviewed: 10/12/2013 Elsevier Interactive Patient Education  2017 [employer].

## 2016-06-05 NOTE — Discharge Summary (Signed)
[facility] NAME: Jimmy Carson    MR#:  [id number]  DATE OF BIRTH:  25-Apr-1941  DATE OF ADMISSION:  05/31/2016   ADMITTING PHYSICIAN: Henreitta Leber, MD  DATE OF DISCHARGE: 06/03/2016  9:43 PM  PRIMARY CARE PHYSICIAN: Dion Body, MD   ADMISSION DIAGNOSIS:  Nonspecific chest pain [R07.9] DISCHARGE DIAGNOSIS:  Active Problems:   Chest pain   NSTEMI (non-ST elevated myocardial infarction) ([place])  SECONDARY DIAGNOSIS:   Past Medical History:  Diagnosis Date  . Anemia   . CAD (coronary artery disease)    s/p percutaneous coronary angioplasty, s/p CABG x 6   . ESRD (end stage renal disease) ([place])   . Heart attack   . History of radiation therapy    for prostate CA  . Hyperlipidemia   . Hyperparathyroidism, secondary renal ([place])   . Hypertension   . Osteoarthritis   . Polycystic kidney disease   . Prostate CA [facility]) 2012   Brachytherapy  . Renal insufficiency    currently on dialysis  . Shingles    HOSPITAL COURSE:  75 year old male with past medical history of end-stage renal disease on hemodialysis, adult polycystic kidney disease, hypertension, secondary hyperparathyroidism, history of coronary artery disease status post stent placement admitted to the hospital due to chest pain.   1. NSTEMI- pt. Has ruled in by cardiac markers. Underwent cath on 12/11 showing  1. Severe three-vessel coronary artery disease 2. Patent LIMA to LAD, patent sequential SVG to D1 and D2, patent SVG to ramus, patent SVG to OM1, patent SVG to PDA  Cardio recommended medical mgmt.  2. Essential hypertension-continue imdur, Metoprolol.    3. ESRD on HD -  - dialysis on MWF.   4. Secondary Hyperparathyroidism - cont. Sensipar, Renvela.   5. BPH - cont. Flomax.   6. Hyperlipidemia - cont. Crestor  DISCHARGE CONDITIONS:  Stable CONSULTS OBTAINED:  Treatment Team:  Isaias Cowman, MD DRUG ALLERGIES:    Allergies  Allergen Reactions  . Orange Fruit [Citrus] Hives  . Penicillins Hives    Has patient had a PCN reaction causing immediate rash, facial/tongue/throat swelling, SOB or lightheadedness with hypotension: Yes Has patient had a PCN reaction causing severe rash involving mucus membranes or skin necrosis: No Has patient had a PCN reaction that required hospitalization No Has patient had a PCN reaction occurring within the last 10 years: No If all of the above answers are "NO", then may proceed with Cephalosporin use.    DISCHARGE MEDICATIONS:     Medication List    TAKE these medications   acetaminophen 325 MG tablet Commonly known as:  TYLENOL Take 650 mg by mouth every 6 (six) hours as needed.   cholecalciferol 400 units Tabs tablet Commonly known as:  VITAMIN D Take 1,000 Units by mouth.   cinacalcet 90 MG tablet Commonly known as:  SENSIPAR Take 90 mg by mouth daily.   clopidogrel 75 MG tablet Commonly known as:  PLAVIX Take 75 mg by mouth daily.   isosorbide mononitrate 60 MG 24 hr tablet Commonly known as:  IMDUR Take 60 mg by mouth daily.   metoprolol tartrate 25 MG tablet Commonly known as:  LOPRESSOR Take 0.5 tablets (12.5 mg total) by mouth every morning.   nitroGLYCERIN 0.4 MG SL tablet Commonly known as:  NITROSTAT Place 1 tablet (0.4 mg total) under the tongue every 5 (five) minutes as needed for chest pain.   rosuvastatin 10 MG tablet Commonly  known as:  CRESTOR Take 10 mg by mouth daily.   sevelamer carbonate 800 MG tablet Commonly known as:  RENVELA Take 2,400 mg by mouth 3 (three) times daily with meals.   tamsulosin 0.4 MG Caps capsule Commonly known as:  FLOMAX Take 0.4 mg by mouth.   traZODone 50 MG tablet Commonly known as:  DESYREL Take 50 mg by mouth at bedtime.        DISCHARGE INSTRUCTIONS:   DIET:  Regular diet DISCHARGE CONDITION:  Good ACTIVITY:  Activity as tolerated OXYGEN:  Home Oxygen: No.  Oxygen  Delivery: room air DISCHARGE LOCATION:  home   If you experience worsening of your admission symptoms, develop shortness of breath, life threatening emergency, suicidal or homicidal thoughts you must seek medical attention immediately by calling 911 or calling your MD immediately  if symptoms less severe.  You Must read complete instructions/literature along with all the possible adverse reactions/side effects for all the Medicines you take and that have been prescribed to you. Take any new Medicines after you have completely understood and accpet all the possible adverse reactions/side effects.   Please note  You were cared for by a hospitalist during your hospital stay. If you have any questions about your discharge medications or the care you received while you were in the hospital after you are discharged, you can call the unit and asked to speak with the hospitalist on call if the hospitalist that took care of you is not available. Once you are discharged, your primary care physician will handle any further medical issues. Please note that NO REFILLS for any discharge medications will be authorized once you are discharged, as it is imperative that you return to your primary care physician (or establish a relationship with a primary care physician if you do not have one) for your aftercare needs so that they can reassess your need for medications and monitor your lab values.    On the day of Discharge:  VITAL SIGNS:  Blood pressure (!) 132/56, pulse 65, temperature 98.6 F (37 C), temperature source Oral, resp. rate (!) 24, height 5\' 10"  (1.778 m), weight 76.5 kg (168 lb 10.4 oz), SpO2 100 %. PHYSICAL EXAMINATION:  GENERAL:  75 y.o.-year-old patient lying in the bed with no acute distress.  EYES: Pupils equal, round, reactive to light and accommodation. No scleral icterus. Extraocular muscles intact.  HEENT: Head atraumatic, normocephalic. Oropharynx and nasopharynx clear.  NECK:  Supple,  no jugular venous distention. No thyroid enlargement, no tenderness.  LUNGS: Normal breath sounds bilaterally, no wheezing, rales,rhonchi or crepitation. No use of accessory muscles of respiration.  CARDIOVASCULAR: S1, S2 normal. No murmurs, rubs, or gallops.  ABDOMEN: Soft, non-tender, non-distended. Bowel sounds present. No organomegaly or mass.  EXTREMITIES: No pedal edema, cyanosis, or clubbing.  NEUROLOGIC: Cranial nerves II through XII are intact. Muscle strength 5/5 in all extremities. Sensation intact. Gait not checked.  PSYCHIATRIC: The patient is alert and oriented x 3.  SKIN: No obvious rash, lesion, or ulcer.  DATA REVIEW:   CBC  Recent Labs Lab 06/03/16 1456  WBC 4.8  HGB 8.2*  HCT 25.1*  PLT 134*    Chemistries   Recent Labs Lab 05/31/16 1815  06/03/16 1456  NA 139  < > 136  K 3.7  < > 4.8  CL 99*  < > 99*  CO2 33*  < > 27  GLUCOSE 159*  < > 102*  BUN 19  < > 39*  CREATININE 2.85*  < >  5.52*  CALCIUM 8.2*  < > 7.9*  AST 13*  --   --   ALT 6*  --   --   ALKPHOS 87  --   --   BILITOT 0.7  --   --   < > = values in this interval not displayed.  Follow-up Information    Dion Body, MD. Go on 06/06/2016.   Specialty:  Family Medicine Why:  at 2:45pm Contact information: [place] [address] [phone]        Irish Elders, MD. Go on 07/02/2016.   Specialty:  Vascular Surgery Why:  at 2:15pm Contact information: [address] [place] [address] [place] [address] [phone]        Franciso Bend, MD. Go on 07/19/2016.   Specialty:  Nephrology Why:  at 1:00pm Contact information: [address] [place] [address] [place] [address] [phone]        Isaias Cowman, MD. Go on 06/11/2016.   Specialty:  Cardiology Why:  at 3:15pm Contact information: [facility] [place] [address] [phone]             Management plans discussed with the patient, family and they are in agreement.  CODE STATUS: FULL CODE  TOTAL TIME TAKING CARE OF THIS PATIENT: 45 minutes.    Max Sane M.D on 06/05/2016 at 11:12 PM  Between 7am to 6pm - Pager - [phone]  After 6pm go to [email or web address] - Proofreader  [facility]  Office  [phone]  CC: Primary care physician; Dion Body, MD   Note: This dictation was prepared with Dragon dictation along with smaller phrase technology. Any transcriptional errors that result from this process are unintentional.

## 2016-07-18 ENCOUNTER — Other Ambulatory Visit: Payer: Self-pay | Admitting: Specialist

## 2016-07-18 DIAGNOSIS — J90 Pleural effusion, not elsewhere classified: Secondary | ICD-10-CM

## 2016-07-18 DIAGNOSIS — J439 Emphysema, unspecified: Secondary | ICD-10-CM | POA: Insufficient documentation

## 2016-07-23 ENCOUNTER — Ambulatory Visit
Admission: RE | Admit: 2016-07-23 | Discharge: 2016-07-23 | Disposition: A | Discharge status: Home / Self Care | Payer: Medicare Other | Source: Ambulatory Visit | Attending: Diagnostic Radiology | Admitting: Diagnostic Radiology

## 2016-07-23 ENCOUNTER — Ambulatory Visit
Admission: RE | Admit: 2016-07-23 | Discharge: 2016-07-23 | Disposition: A | Discharge status: Home / Self Care | Payer: Medicare Other | Source: Ambulatory Visit | Attending: Specialist | Admitting: Specialist

## 2016-07-23 DIAGNOSIS — R846 Abnormal cytological findings in specimens from respiratory organs and thorax: Secondary | ICD-10-CM | POA: Insufficient documentation

## 2016-07-23 DIAGNOSIS — Z9889 Other specified postprocedural states: Secondary | ICD-10-CM | POA: Diagnosis not present

## 2016-07-23 DIAGNOSIS — J90 Pleural effusion, not elsewhere classified: Secondary | ICD-10-CM

## 2016-07-23 DIAGNOSIS — J939 Pneumothorax, unspecified: Secondary | ICD-10-CM | POA: Diagnosis not present

## 2016-07-23 LAB — GLUCOSE, SEROUS FLUID: Glucose, Fluid: 96 mg/dL

## 2016-07-23 LAB — BODY FLUID CELL COUNT WITH DIFFERENTIAL
EOS FL: 2 %
Lymphs, Fluid: 31 %
Monocyte-Macrophage-Serous Fluid: 59 %
NEUTROPHIL FLUID: 8 %
Other Cells, Fluid: 0 %
WBC FLUID: 154 uL

## 2016-07-23 LAB — PROTEIN, BODY FLUID: Total protein, fluid: <3 g/dL

## 2016-07-23 LAB — LACTATE DEHYDROGENASE, PLEURAL OR PERITONEAL FLUID: LD FL: 101 U/L — AB (ref 3–23)

## 2016-07-23 NOTE — Procedures (Signed)
US guided left thoracentesis.  Removed 1.1 liters of yellow fluid.  Stopped procedure due to excessive coughing.  Minimal blood loss.

## 2016-07-23 NOTE — Progress Notes (Signed)
Patient ID: Jimmy Carson, male   DOB: [birth year], 76 y.o.   MRN: [id number] The post thoracentesis CXR demonstrated a small amount of pleural air.  Patient was asymptomatic but I was concerned that the pneumothorax could enlarge because he had experienced a lot of coughing immediately following the procedure.  A follow-up CXR was performed approximately one hour later.  The follow-up CXR demonstrated a stable small pneumothorax and patient was asymptomatic without additional coughing.  I felt comfortable sending the patient home.  Instructed him to go to the ED if he experienced severe chest pain or shortness of breath later tonight.  Patient was comfortable going home and had a good understanding of the symptoms associated with an enlarging pneumothorax.

## 2016-07-24 LAB — MISC LABCORP TEST (SEND OUT): LABCORP TEST CODE: 19588

## 2016-07-24 LAB — CYTOLOGY - NON PAP

## 2016-07-25 ENCOUNTER — Emergency Department: Payer: Medicare Other

## 2016-07-25 ENCOUNTER — Encounter: Payer: Self-pay | Admitting: Emergency Medicine

## 2016-07-25 ENCOUNTER — Emergency Department
Admission: EM | Admit: 2016-07-25 | Discharge: 2016-07-25 | Disposition: A | Discharge status: Home / Self Care | Payer: Medicare Other | Attending: Emergency Medicine | Admitting: Emergency Medicine

## 2016-07-25 ENCOUNTER — Other Ambulatory Visit: Payer: Self-pay

## 2016-07-25 DIAGNOSIS — N186 End stage renal disease: Secondary | ICD-10-CM | POA: Diagnosis not present

## 2016-07-25 DIAGNOSIS — Z951 Presence of aortocoronary bypass graft: Secondary | ICD-10-CM | POA: Insufficient documentation

## 2016-07-25 DIAGNOSIS — Z8546 Personal history of malignant neoplasm of prostate: Secondary | ICD-10-CM | POA: Insufficient documentation

## 2016-07-25 DIAGNOSIS — Z79899 Other long term (current) drug therapy: Secondary | ICD-10-CM | POA: Insufficient documentation

## 2016-07-25 DIAGNOSIS — Z952 Presence of prosthetic heart valve: Secondary | ICD-10-CM | POA: Diagnosis not present

## 2016-07-25 DIAGNOSIS — R042 Hemoptysis: Secondary | ICD-10-CM | POA: Diagnosis present

## 2016-07-25 DIAGNOSIS — I2510 Atherosclerotic heart disease of native coronary artery without angina pectoris: Secondary | ICD-10-CM | POA: Diagnosis not present

## 2016-07-25 DIAGNOSIS — Z87891 Personal history of nicotine dependence: Secondary | ICD-10-CM | POA: Diagnosis not present

## 2016-07-25 DIAGNOSIS — I120 Hypertensive chronic kidney disease with stage 5 chronic kidney disease or end stage renal disease: Secondary | ICD-10-CM | POA: Insufficient documentation

## 2016-07-25 DIAGNOSIS — Z992 Dependence on renal dialysis: Secondary | ICD-10-CM | POA: Diagnosis not present

## 2016-07-25 LAB — COMPREHENSIVE METABOLIC PANEL
ALK PHOS: 83 U/L (ref 38–126)
ALT: 12 U/L — AB (ref 17–63)
ANION GAP: 10 (ref 5–15)
AST: 21 U/L (ref 15–41)
Albumin: 3 g/dL — ABNORMAL LOW (ref 3.5–5.0)
BILIRUBIN TOTAL: 0.8 mg/dL (ref 0.3–1.2)
BUN: 28 mg/dL — ABNORMAL HIGH (ref 6–20)
CALCIUM: 7.2 mg/dL — AB (ref 8.9–10.3)
CO2: 29 mmol/L (ref 22–32)
CREATININE: 4.55 mg/dL — AB (ref 0.61–1.24)
Chloride: 99 mmol/L — ABNORMAL LOW (ref 101–111)
GFR, EST AFRICAN AMERICAN: 13 mL/min — AB (ref >60)
GFR, EST NON AFRICAN AMERICAN: 12 mL/min — AB (ref >60)
Glucose, Bld: 74 mg/dL (ref 65–99)
Potassium: 4.7 mmol/L (ref 3.5–5.1)
SODIUM: 138 mmol/L (ref 135–145)
TOTAL PROTEIN: 6.3 g/dL — AB (ref 6.5–8.1)

## 2016-07-25 LAB — CBC
HCT: 30.2 % — ABNORMAL LOW (ref 40.0–52.0)
HEMOGLOBIN: 9.8 g/dL — AB (ref 13.0–18.0)
MCH: 29.6 pg (ref 26.0–34.0)
MCHC: 32.4 g/dL (ref 32.0–36.0)
MCV: 91.3 fL (ref 80.0–100.0)
Platelets: 93 10*3/uL — ABNORMAL LOW (ref 150–440)
RBC: 3.31 MIL/uL — AB (ref 4.40–5.90)
RDW: 17.3 % — ABNORMAL HIGH (ref 11.5–14.5)
WBC: 4.2 10*3/uL (ref 3.8–10.6)

## 2016-07-25 MED ORDER — PREDNISONE 20 MG PO TABS: 40.0000 mg | ORAL_TABLET | Freq: Every day | ORAL | 0 refills | Status: DC

## 2016-07-25 NOTE — Discharge Instructions (Signed)
You have been seen in the emergency department today for bloody sputum. Your workup has not shown any acutely concerning abnormalities. Please take your steroids as prescribed for their entire course. Please follow-up with your primary care doctor in the next several days for recheck/reevaluation. Return to the emergency department for any chest pain, trouble breathing, or increased bleeding.

## 2016-07-25 NOTE — ED Provider Notes (Signed)
[facility] Emergency Department Provider Note  Time seen: 1:48 PM  I have reviewed the triage vital signs and the nursing notes.   HISTORY  Chief Complaint Hemoptysis    HPI Jimmy Carson is a 76 y.o. male with a past medical history of anemia, end-stage renal disease on hemodialysis, MI, hypertension, hyperlipidemia, presents to the emergency department with hemoptysis. According to the patient he has had fluid accumulation in his legs as well as in his chest. Patient had a thoracentesis performed 2 days ago. He states last night he was coughing and is producing white sputum with occasional blood streaking in his sputum. Patient denies any shortness of breath, does state generalized weakness. Denies any chest pain. States occasional cough with sputum production but denies any fever or other congestion. Patient does make urine every day.  Past Medical History:  Diagnosis Date  . Anemia   . CAD (coronary artery disease)    s/p percutaneous coronary angioplasty, s/p CABG x 6   . ESRD (end stage renal disease) ([place])   . Heart attack   . History of radiation therapy    for prostate CA  . Hyperlipidemia   . Hyperparathyroidism, secondary renal ([place])   . Hypertension   . Osteoarthritis   . Polycystic kidney disease   . Prostate CA [facility]) 2012   Brachytherapy  . Renal insufficiency    currently on dialysis  . Shingles     Patient Active Problem List   Diagnosis Date Noted  . NSTEMI (non-ST elevated myocardial infarction) ([place]) 07/22/2015  . Chest pain 07/21/2015    Past Surgical History:  Procedure Laterality Date  . AORTIC VALVE REPLACEMENT    . CARDIAC CATHETERIZATION N/A 07/24/2015   Procedure: Coronary/Graft Angiography;  Surgeon: Corey Skains, MD;  Location: [facility] CV [facility];  Service: Cardiovascular;  Laterality: N/A;  . CARDIAC CATHETERIZATION N/A 06/03/2016   Procedure: Left Heart Cath and Coronary Angiography;  Surgeon: Isaias Cowman, MD;  Location: [facility] CV LAB;  Service: Cardiovascular;  Laterality: N/A;  . CORONARY ARTERY BYPASS GRAFT    . JOINT REPLACEMENT    . REPLACEMENT TOTAL KNEE BILATERAL    . ROTATOR CUFF REPAIR Right     Prior to Admission medications   Medication Sig Start Date End Date Taking? Authorizing Provider  acetaminophen (TYLENOL) 325 MG tablet Take 650 mg by mouth every 6 (six) hours as needed.    Historical Provider, MD  cholecalciferol (VITAMIN D) 400 UNITS TABS tablet Take 1,000 Units by mouth.    Historical Provider, MD  cinacalcet (SENSIPAR) 90 MG tablet Take 90 mg by mouth daily.    Historical Provider, MD  clopidogrel (PLAVIX) 75 MG tablet Take 75 mg by mouth daily.    Historical Provider, MD  isosorbide mononitrate (IMDUR) 60 MG 24 hr tablet Take 60 mg by mouth daily.    Historical Provider, MD  metoprolol tartrate (LOPRESSOR) 25 MG tablet Take 0.5 tablets (12.5 mg total) by mouth every morning. 06/03/16   Max Sane, MD  nitroGLYCERIN (NITROSTAT) 0.4 MG SL tablet Place 1 tablet (0.4 mg total) under the tongue every 5 (five) minutes as needed for chest pain. 06/03/16   Max Sane, MD  rosuvastatin (CRESTOR) 10 MG tablet Take 10 mg by mouth daily.    Historical Provider, MD  sevelamer carbonate (RENVELA) 800 MG tablet Take 2,400 mg by mouth 3 (three) times daily with meals.     Historical Provider, MD  tamsulosin (FLOMAX) 0.4 MG CAPS  capsule Take 0.4 mg by mouth.    Historical Provider, MD  traZODone (DESYREL) 50 MG tablet Take 50 mg by mouth at bedtime.    Historical Provider, MD    Allergies  Allergen Reactions  . Orange Fruit [Citrus] Hives  . Penicillins Hives    Has patient had a PCN reaction causing immediate rash, facial/tongue/throat swelling, SOB or lightheadedness with hypotension: Yes Has patient had a PCN reaction causing severe rash involving mucus membranes or skin necrosis: No Has patient had a PCN reaction that required hospitalization No Has patient had  a PCN reaction occurring within the last 10 years: No If all of the above answers are "NO", then may proceed with Cephalosporin use.     Family History  Problem Relation Age of Onset  . Polycystic kidney disease Father   . Aneurysm Father     Social History Social History  Substance Use Topics  . Smoking status: Former Smoker    Quit date: 04/19/2013  . Smokeless tobacco: Never Used  . Alcohol use No    Review of Systems Constitutional: Negative for fever. Cardiovascular: Negative for chest pain. Respiratory: Negative for shortness of breath.Positive for hemoptysis and cough. Gastrointestinal: Negative for abdominal pain, vomiting Neurological: Negative for headache 10-point ROS otherwise negative.  ____________________________________________   PHYSICAL EXAM:  VITAL SIGNS: ED Triage Vitals [07/25/16 1257]  Enc Vitals Group     BP 135/88     Pulse Rate 76     Resp 20     Temp 98.5 F (36.9 C)     Temp Source Oral     SpO2 98 %     Weight 177 lb (80.3 kg)     Height 5\' 10"  (1.778 m)     Head Circumference      Peak Flow      Pain Score      Pain Loc      Pain Edu?      Excl. in [place]?     Constitutional: Alert and oriented. Well appearing and in no distress. Eyes: Normal exam ENT   Head: Normocephalic and atraumatic.   Mouth/Throat: Mucous membranes are moist. Cardiovascular: Normal rate, regular rhythm. No murmur Respiratory: Normal respiratory effort without tachypnea nor retractions. Mild rhonchi auscultated bilaterally. Without obvious wheezes or rales. Gastrointestinal: Soft and nontender. No distention.  Musculoskeletal: Nontender with normal range of motion in all extremities. 1+ lower extremity edema, equal bilaterally. Neurologic:  Normal speech and language. No gross focal neurologic deficits  Skin:  Skin is warm, dry and intact.  Psychiatric: Mood and affect are normal.   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  ____________________________________________    RADIOLOGY  Chest x-ray shows decreased left-sided pneumothorax with small left pleural effusion.  ____________________________________________   INITIAL IMPRESSION / ASSESSMENT AND PLAN / ED COURSE  Pertinent labs & imaging results that were available during my care of the patient were reviewed by me and considered in my medical decision making (see chart for details).  Patient presents with hemoptysis. We'll obtain a CT scan of chest without contrast as the patient continues to have daily urine output. We'll check labs and closely monitor in the emergency department. Patient agreeable to plan. Overall the patient appears well, no distress.  Patient CT scan shows a small pneumothorax which is unchanged. Small to moderate  pleural effusion. No signs or symptoms of alveolar hemorrhage. I suspect the patient is likely seeing blood streaking due to increased cough pharyngeal irritation after his thoracentesis. We'll place on a course of steroids and have the patient follow-up with his primary care doctor for recheck. I discussed return precautions with the patient for any increased bleeding, chest pain or shortness of breath. Patient agreeable.   ____________________________________________   FINAL CLINICAL IMPRESSION(S) / ED DIAGNOSES  Hemoptysis    Harvest Dark, MD 07/25/16 1504

## 2016-07-25 NOTE — ED Triage Notes (Signed)
Pt in via POV with complaints of shortness of breath and coughing up frank blood since last night.  Pt reports having thoracentesis Tuesday of this week; pulmonologist advised pt to be evaluated in ER.  Vitals WDL, NAD noted at this time.

## 2016-07-25 NOTE — ED Notes (Signed)
Dr. Kerman Passey in room to discuss discharge at this time.  Will continue to monitor.

## 2016-07-26 LAB — BODY FLUID CULTURE: CULTURE: NO GROWTH

## 2016-09-30 LAB — FUNGUS CULTURE RESULT

## 2016-09-30 LAB — FUNGUS CULTURE WITH STAIN

## 2016-09-30 LAB — ACID FAST SMEAR (AFB): ACID FAST SMEAR - AFSCU2: NEGATIVE

## 2016-09-30 LAB — FUNGAL ORGANISM REFLEX

## 2016-09-30 LAB — ACID FAST SMEAR (AFB, MYCOBACTERIA)

## 2016-10-09 LAB — ACID FAST CULTURE WITH REFLEXED SENSITIVITIES (MYCOBACTERIA): Acid Fast Culture: NEGATIVE

## 2017-07-16 ENCOUNTER — Other Ambulatory Visit: Payer: Self-pay | Admitting: Family Medicine

## 2017-07-16 DIAGNOSIS — H547 Unspecified visual loss: Secondary | ICD-10-CM

## 2017-07-18 ENCOUNTER — Other Ambulatory Visit: Payer: Self-pay | Admitting: Specialist

## 2017-07-18 DIAGNOSIS — J90 Pleural effusion, not elsewhere classified: Secondary | ICD-10-CM

## 2017-07-22 ENCOUNTER — Ambulatory Visit
Admission: RE | Admit: 2017-07-22 | Discharge: 2017-07-22 | Disposition: A | Discharge status: Home / Self Care | Payer: Medicare Other | Source: Ambulatory Visit | Attending: General Surgery | Admitting: General Surgery

## 2017-07-22 ENCOUNTER — Ambulatory Visit
Admission: RE | Admit: 2017-07-22 | Discharge: 2017-07-22 | Disposition: A | Discharge status: Home / Self Care | Payer: Medicare Other | Source: Ambulatory Visit | Attending: Specialist | Admitting: Specialist

## 2017-07-22 DIAGNOSIS — J90 Pleural effusion, not elsewhere classified: Secondary | ICD-10-CM | POA: Diagnosis not present

## 2017-07-22 DIAGNOSIS — Z9889 Other specified postprocedural states: Secondary | ICD-10-CM

## 2017-07-22 LAB — PROTEIN, PLEURAL OR PERITONEAL FLUID

## 2017-07-22 LAB — BODY FLUID CELL COUNT WITH DIFFERENTIAL
Eos, Fluid: 0 %
LYMPHS FL: 31 %
Monocyte-Macrophage-Serous Fluid: 65 %
NEUTROPHIL FLUID: 4 %
Total Nucleated Cell Count, Fluid: 208 cu mm

## 2017-07-22 LAB — GLUCOSE, PLEURAL OR PERITONEAL FLUID: Glucose, Fluid: 91 mg/dL

## 2017-07-22 LAB — LACTATE DEHYDROGENASE, PLEURAL OR PERITONEAL FLUID: LD, Fluid: 89 U/L — ABNORMAL HIGH (ref 3–23)

## 2017-07-22 NOTE — Procedures (Signed)
Ultrasound-guided diagnostic and therapeutic left thoracentesis performed yielding 1.3 liters of serous colored fluid. No immediate complications. Follow-up chest x-ray pending.   Jimmy Carson 10:59 AM 10/06/202019

## 2017-07-23 LAB — CYTOLOGY - NON PAP

## 2017-07-23 LAB — MISC LABCORP TEST (SEND OUT): Labcorp test code: 19588

## 2017-07-24 ENCOUNTER — Ambulatory Visit
Admission: RE | Admit: 2017-07-24 | Discharge: 2017-07-24 | Disposition: A | Discharge status: Home / Self Care | Payer: Medicare Other | Source: Ambulatory Visit | Attending: Family Medicine | Admitting: Family Medicine

## 2017-07-24 DIAGNOSIS — H547 Unspecified visual loss: Secondary | ICD-10-CM | POA: Insufficient documentation

## 2017-07-24 DIAGNOSIS — G319 Degenerative disease of nervous system, unspecified: Secondary | ICD-10-CM | POA: Diagnosis not present

## 2017-07-24 DIAGNOSIS — R0602 Shortness of breath: Secondary | ICD-10-CM | POA: Insufficient documentation

## 2017-07-24 DIAGNOSIS — I6782 Cerebral ischemia: Secondary | ICD-10-CM | POA: Diagnosis not present

## 2017-07-24 LAB — ACID FAST SMEAR (AFB): ACID FAST SMEAR - AFSCU2: NEGATIVE

## 2017-07-24 LAB — ACID FAST SMEAR (AFB, MYCOBACTERIA)

## 2017-07-26 LAB — BODY FLUID CULTURE: Culture: NO GROWTH

## 2017-08-20 LAB — FUNGUS CULTURE WITH STAIN

## 2017-08-20 LAB — FUNGAL ORGANISM REFLEX

## 2017-08-20 LAB — FUNGUS CULTURE RESULT

## 2017-08-29 ENCOUNTER — Inpatient Hospital Stay
Admission: EM | Admit: 2017-08-29 | Discharge: 2017-08-31 | DRG: 186 | Disposition: A | Discharge status: Home / Self Care | Payer: Medicare Other | Attending: Internal Medicine | Admitting: Internal Medicine

## 2017-08-29 ENCOUNTER — Emergency Department: Payer: Medicare Other

## 2017-08-29 ENCOUNTER — Other Ambulatory Visit: Payer: Self-pay

## 2017-08-29 DIAGNOSIS — N400 Enlarged prostate without lower urinary tract symptoms: Secondary | ICD-10-CM | POA: Diagnosis present

## 2017-08-29 DIAGNOSIS — J9601 Acute respiratory failure with hypoxia: Secondary | ICD-10-CM | POA: Diagnosis present

## 2017-08-29 DIAGNOSIS — I10 Essential (primary) hypertension: Secondary | ICD-10-CM | POA: Diagnosis present

## 2017-08-29 DIAGNOSIS — Z8546 Personal history of malignant neoplasm of prostate: Secondary | ICD-10-CM

## 2017-08-29 DIAGNOSIS — M1990 Unspecified osteoarthritis, unspecified site: Secondary | ICD-10-CM | POA: Diagnosis not present

## 2017-08-29 DIAGNOSIS — Z923 Personal history of irradiation: Secondary | ICD-10-CM

## 2017-08-29 DIAGNOSIS — Z952 Presence of prosthetic heart valve: Secondary | ICD-10-CM

## 2017-08-29 DIAGNOSIS — Z880 Allergy status to penicillin: Secondary | ICD-10-CM | POA: Diagnosis not present

## 2017-08-29 DIAGNOSIS — Z992 Dependence on renal dialysis: Secondary | ICD-10-CM

## 2017-08-29 DIAGNOSIS — Z951 Presence of aortocoronary bypass graft: Secondary | ICD-10-CM

## 2017-08-29 DIAGNOSIS — I252 Old myocardial infarction: Secondary | ICD-10-CM

## 2017-08-29 DIAGNOSIS — Z91018 Allergy to other foods: Secondary | ICD-10-CM | POA: Diagnosis not present

## 2017-08-29 DIAGNOSIS — E785 Hyperlipidemia, unspecified: Secondary | ICD-10-CM | POA: Diagnosis present

## 2017-08-29 DIAGNOSIS — N186 End stage renal disease: Secondary | ICD-10-CM | POA: Diagnosis present

## 2017-08-29 DIAGNOSIS — Z87891 Personal history of nicotine dependence: Secondary | ICD-10-CM | POA: Diagnosis not present

## 2017-08-29 DIAGNOSIS — I2510 Atherosclerotic heart disease of native coronary artery without angina pectoris: Secondary | ICD-10-CM | POA: Diagnosis not present

## 2017-08-29 DIAGNOSIS — J90 Pleural effusion, not elsewhere classified: Principal | ICD-10-CM | POA: Diagnosis present

## 2017-08-29 DIAGNOSIS — Z79899 Other long term (current) drug therapy: Secondary | ICD-10-CM | POA: Diagnosis not present

## 2017-08-29 DIAGNOSIS — Z7902 Long term (current) use of antithrombotics/antiplatelets: Secondary | ICD-10-CM | POA: Diagnosis not present

## 2017-08-29 DIAGNOSIS — Z9861 Coronary angioplasty status: Secondary | ICD-10-CM

## 2017-08-29 DIAGNOSIS — N2581 Secondary hyperparathyroidism of renal origin: Secondary | ICD-10-CM | POA: Diagnosis not present

## 2017-08-29 DIAGNOSIS — E039 Hypothyroidism, unspecified: Secondary | ICD-10-CM | POA: Diagnosis not present

## 2017-08-29 DIAGNOSIS — I1311 Hypertensive heart and chronic kidney disease without heart failure, with stage 5 chronic kidney disease, or end stage renal disease: Secondary | ICD-10-CM | POA: Diagnosis present

## 2017-08-29 DIAGNOSIS — D631 Anemia in chronic kidney disease: Secondary | ICD-10-CM | POA: Diagnosis present

## 2017-08-29 DIAGNOSIS — Z96653 Presence of artificial knee joint, bilateral: Secondary | ICD-10-CM | POA: Diagnosis present

## 2017-08-29 DIAGNOSIS — Q613 Polycystic kidney, unspecified: Secondary | ICD-10-CM | POA: Diagnosis not present

## 2017-08-29 DIAGNOSIS — R0600 Dyspnea, unspecified: Secondary | ICD-10-CM

## 2017-08-29 LAB — CBC WITH DIFFERENTIAL/PLATELET
BASOS ABS: 0.1 10*3/uL (ref 0–0.1)
Basophils Relative: 1 %
EOS ABS: 0.1 10*3/uL (ref 0–0.7)
EOS PCT: 1 %
HCT: 36 % — ABNORMAL LOW (ref 40.0–52.0)
Hemoglobin: 11.5 g/dL — ABNORMAL LOW (ref 13.0–18.0)
LYMPHS PCT: 5 %
Lymphs Abs: 0.4 10*3/uL — ABNORMAL LOW (ref 1.0–3.6)
MCH: 28 pg (ref 26.0–34.0)
MCHC: 31.9 g/dL — ABNORMAL LOW (ref 32.0–36.0)
MCV: 88 fL (ref 80.0–100.0)
MONO ABS: 0.7 10*3/uL (ref 0.2–1.0)
Monocytes Relative: 10 %
Neutro Abs: 5.4 10*3/uL (ref 1.4–6.5)
Neutrophils Relative %: 83 %
PLATELETS: 226 10*3/uL (ref 150–440)
RBC: 4.09 MIL/uL — ABNORMAL LOW (ref 4.40–5.90)
RDW: 18.7 % — AB (ref 11.5–14.5)
WBC: 6.6 10*3/uL (ref 3.8–10.6)

## 2017-08-29 LAB — TROPONIN I: TROPONIN I: 0.03 ng/mL — AB (ref <0.03)

## 2017-08-29 NOTE — ED Provider Notes (Signed)
[facility] Emergency Department Provider Note  ____________________________________________   First MD Initiated Contact with Patient 08/29/17 2323     (approximate)  I have reviewed the triage vital signs and the nursing notes.   HISTORY  Chief Complaint Shortness of Breath    HPI Jimmy Carson is a 77 y.o. male With a past medical history that includes end-stage renal disease on hemodialysis Mondays, Wednesdays, and Fridays, as well as a prior history of pleural effusion requiring thoracentesis 2 months ago.  His pulmonologist is Dr. Raul Del.  He presents today with 3-4 days of gradually worsening shortness of breath that is now severe.  He reports that any amount of ambulation completely takes his breath away although he is relatively okay sitting at rest.  He cannot lie flat.  He says that he feels worse now than he did 2 months ago when he had the fluid drained off of his lung.  He denies fever/chills, chest pain, nausea, vomiting, and abdominal pain.  He still makes urine although it is minimal, and he has been on dialysis for 10 years.  He had his full course of dialysis today without complication.  He said that they always put him on oxygen when he is being dialyzed and usually once he goes home he feels fine, but his shortness of breath showed no improvement today after dialysis.  Nothing in particular is making his symptoms better this time.  Past Medical History:  Diagnosis Date  . Anemia   . CAD (coronary artery disease)    s/p percutaneous coronary angioplasty, s/p CABG x 6   . ESRD (end stage renal disease) ([place])   . Heart attack ([place])   . History of radiation therapy    for prostate CA  . Hyperlipidemia   . Hyperparathyroidism, secondary renal ([place])   . Hypertension   . Osteoarthritis   . Polycystic kidney disease   . Prostate CA [facility]) 2012   Brachytherapy  . Renal insufficiency    currently on dialysis  . Shingles     Patient  Active Problem List   Diagnosis Date Noted  . Pleural effusion on left 08/30/2017  . ESRD on dialysis ([place]) 08/30/2017  . HTN (hypertension) 08/30/2017  . CAD (coronary artery disease) 08/30/2017  . HLD (hyperlipidemia) 08/30/2017  . NSTEMI (non-ST elevated myocardial infarction) ([place]) 07/22/2015  . Chest pain 07/21/2015    Past Surgical History:  Procedure Laterality Date  . AORTIC VALVE REPLACEMENT    . CARDIAC CATHETERIZATION N/A 07/24/2015   Procedure: Coronary/Graft Angiography;  Surgeon: Corey Skains, MD;  Location: [place] CV [facility];  Service: Cardiovascular;  Laterality: N/A;  . CARDIAC CATHETERIZATION N/A 06/03/2016   Procedure: Left Heart Cath and Coronary Angiography;  Surgeon: Isaias Cowman, MD;  Location: [place] [facility];  Service: Cardiovascular;  Laterality: N/A;  . CORONARY ARTERY BYPASS GRAFT    . JOINT REPLACEMENT    . REPLACEMENT TOTAL KNEE BILATERAL    . ROTATOR CUFF REPAIR Right     Prior to Admission medications   Medication Sig Start Date End Date Taking? Authorizing Provider  acetaminophen (TYLENOL) 325 MG tablet Take 650 mg by mouth every 4 (four) hours as needed for mild pain.    Yes [provider]  b complex-C-folic acid 1 MG capsule Take 1 capsule by mouth daily.   Yes [provider]  cholecalciferol (VITAMIN D) 400 UNITS TABS tablet Take 1,000 Units by mouth.   Yes [provider]  cinacalcet (SENSIPAR) 60 MG tablet Take 60 mg by mouth daily.    Yes [provider]  clopidogrel (PLAVIX) 75 MG tablet Take 75 mg by mouth daily.   Yes [provider]  isosorbide mononitrate (IMDUR) 60 MG 24 hr tablet Take 60 mg by mouth daily.   Yes [provider]  nitroGLYCERIN (NITROSTAT) 0.4 MG SL tablet Place 1 tablet (0.4 mg total) under the tongue every 5 (five) minutes as needed for chest pain. 06/03/16  Yes Max Sane, MD  rosuvastatin (CRESTOR) 10 MG tablet Take 10 mg by mouth daily.   Yes  [provider]  sevelamer carbonate (RENVELA) 800 MG tablet Take 2,400 mg by mouth 3 (three) times daily with meals. How many he takes with his meals depends on what he's eating   Yes [provider]  sevelamer carbonate (RENVELA) 800 MG tablet Take 1,600 mg by mouth as needed (with snacks).   Yes [provider]  tamsulosin (FLOMAX) 0.4 MG CAPS capsule Take 0.4 mg by mouth.   Yes [provider]  traZODone (DESYREL) 50 MG tablet Take 50 mg by mouth at bedtime.   Yes [provider]    Allergies Orange fruit [citrus] and Penicillins  Family History  Problem Relation Age of Onset  . Polycystic kidney disease Father   . Aneurysm Father     Social History Social History   Tobacco Use  . Smoking status: Former Smoker    Last attempt to quit: 04/19/2013    Years since quitting: 4.3  . Smokeless tobacco: Never Used  Substance Use Topics  . Alcohol use: No  . Drug use: No    Review of Systems Constitutional: No fever/chills Eyes: No visual changes. ENT: No sore throat. Cardiovascular: Denies chest pain. Respiratory: Shortness of breath, much worse with exertion and lying flat, as described above Gastrointestinal: No abdominal pain.  No nausea, no vomiting.  No diarrhea.  No constipation. Genitourinary: Negative for dysuria. Musculoskeletal: Negative for neck pain.  Negative for back pain. Integumentary: Negative for rash. Neurological: Negative for headaches, focal weakness or numbness.   ____________________________________________   PHYSICAL EXAM:  VITAL SIGNS: ED Triage Vitals  Enc Vitals Group     BP 08/29/17 2244 (!) 157/66     Pulse Rate 08/29/17 2244 92     Resp 08/29/17 2244 20     Temp 08/29/17 2244 98 F (36.7 C)     Temp Source 08/29/17 2244 Oral     SpO2 08/29/17 2244 96 %     Weight 08/29/17 2244 78.9 kg (174 lb)     Height 08/29/17 2244 1.778 m (5\' 10" )     Head Circumference --      Peak Flow --       Pain Score 08/29/17 2248 3     Pain Loc --      Pain Edu? --      Excl. in [place]? --     Constitutional: Alert and oriented. Well appearing and in no acute distress while at rest Eyes: Conjunctivae are normal.  Head: Atraumatic. Nose: No congestion/rhinnorhea. Mouth/Throat: Mucous membranes are moist. Neck: No stridor.  No meningeal signs.   Cardiovascular: Normal rate, regular rhythm. Good peripheral circulation. Grossly normal heart sounds. Respiratory: Slightly increased respiratory effort; the patient pauses during long sentences to take a force of breath. No retractions.  Diminished lung sounds on the left but clear sounds in the apex.  No expiratory wheezing. Gastrointestinal: Soft and nontender. No distention.  Musculoskeletal: No lower extremity tenderness nor edema. No gross deformities of extremities. Neurologic:  Normal speech and language. No gross focal neurologic deficits are appreciated.  Skin:  Skin is warm, dry and intact. No rash noted. Psychiatric: Mood and affect are normal. Speech and behavior are normal.  ____________________________________________   LABS (all labs ordered are listed, but only abnormal results are displayed)  Labs Reviewed  CBC WITH DIFFERENTIAL/PLATELET - Abnormal; Notable for the following components:      Result Value   RBC 4.09 (*)    Hemoglobin 11.5 (*)    HCT 36.0 (*)    MCHC 31.9 (*)    RDW 18.7 (*)    Lymphs Abs 0.4 (*)    All other components within normal limits  TROPONIN I - Abnormal; Notable for the following components:   Troponin I 0.03 (*)    All other components within normal limits  COMPREHENSIVE METABOLIC PANEL - Abnormal; Notable for the following components:   Chloride 96 (*)    Creatinine, Ser 3.17 (*)    ALT 10 (*)    Alkaline Phosphatase 136 (*)    GFR calc non Af Amer 18 (*)    GFR calc Af Amer 21 (*)    Anion gap 17 (*)    All other components within normal limits  MAGNESIUM  BASIC METABOLIC PANEL  CBC    ____________________________________________  EKG  ED ECG REPORT I, Hinda Kehr, the attending physician, personally viewed and interpreted this ECG.  Date: 08/29/2017 EKG Time: 22: 41 Rate: 92 Rhythm: Sinus rhythm with first-degree AV block QRS Axis: Left Axis deviation Intervals: Left bundle branch block ST/T Wave abnormalities: Non-specific ST segment / T-wave changes, but no evidence of acute ischemia. Narrative Interpretation: no evidence of acute ischemia and no significant change from EKG obtained about 1 month ago, left bundle branch block was pre-existing.   ____________________________________________  RADIOLOGY I, Hinda Kehr, personally viewed and evaluated these images (plain radiographs) as part of my medical decision making, as well as reviewing the written report by the radiologist.  ED MD interpretation: Cardiomegaly, some vascular congestion, and moderate left-sided pleural effusion.  Official radiology report(s): Dg Chest 2 View  Result Date: 08/29/2017 CLINICAL DATA:  Dyspnea EXAM: CHEST - 2 VIEW COMPARISON:  None. FINDINGS: Stable cardiomegaly with median sternotomy sutures, post CABG change and evidence of prior aortic valvular replacement. Pulmonary vascular congestion is noted with moderate small left effusion. High-riding right humeral head with tapered appearance of the distal right clavicle may represent stigmata rheumatoid arthritis and/or chronic rotator cuff tear. IMPRESSION: Cardiomegaly with mild vascular congestion and moderate left effusion. Electronically Signed   By: Ashley Royalty M.D.   On: 08/29/2017 23:16    ____________________________________________   PROCEDURES  Critical Care performed: No   Procedure(s) performed:   Procedures   ____________________________________________   INITIAL IMPRESSION / ASSESSMENT AND PLAN / ED COURSE  As part of my medical decision making, I reviewed the following data within the [facility] notes reviewed and incorporated, Labs reviewed , EKG interpreted , Old chart reviewed, Radiograph reviewed , Discussed with admitting physician  and Notes from prior ED visits    Differential includes, but is not limited to, viral syndrome, bronchitis including COPD exacerbation, pneumonia, reactive airway disease including asthma, CHF including exacerbation with or without pulmonary/interstitial edema, pneumothorax, ACS, thoracic trauma, and pulmonary embolism.  In this particular case, the patient seems to be suffering mostly from the reaccumulation of the left-sided  pleural effusion.  He has obviously diminished breath sounds and severe orthopnea and exertional dyspnea.  His oxygen saturation is about 92-94% at rest, but I will ambulate him to see if it drops to hypoxemic levels.  Regardless, though, he has severe exertional dyspnea and appears that he would benefit significantly from a thoracentesis.  I will start 2 L of oxygen by nasal cannula for comfort after the trial of ambulation and anticipate discussing admission with the hospitalist so that the patient can receive the appropriate pulmonology care tomorrow morning.  Patient agrees with the plan.  Clinical Course as of Aug 30 221  Sat Aug 30, 2017  0010 Troponin I: (!!) 0.03 [CF]  0011 The patient dropped to 87% on room air with ambulation.  He is now on 2 L of oxygen and I will discuss admission with the hospitalist.  [CF]    Clinical Course User Index [CF] Hinda Kehr, MD    ____________________________________________  FINAL CLINICAL IMPRESSION(S) / ED DIAGNOSES  Final diagnoses:  Acute respiratory failure with hypoxemia (HCC)  Recurrent left pleural effusion     MEDICATIONS GIVEN DURING THIS VISIT:  Medications  clopidogrel (PLAVIX) tablet 75 mg (not administered)  cholecalciferol (VITAMIN D) tablet 1,000 Units (not administered)  isosorbide mononitrate (IMDUR) 24 hr tablet 60 mg (not  administered)  metoprolol tartrate (LOPRESSOR) tablet 12.5 mg (not administered)  rosuvastatin (CRESTOR) tablet 10 mg (not administered)  tamsulosin (FLOMAX) capsule 0.4 mg (not administered)  traZODone (DESYREL) tablet 50 mg (not administered)  heparin injection 5,000 Units (not administered)  acetaminophen (TYLENOL) tablet 650 mg (not administered)    Or  acetaminophen (TYLENOL) suppository 650 mg (not administered)  ondansetron (ZOFRAN) tablet 4 mg (not administered)    Or  ondansetron (ZOFRAN) injection 4 mg (not administered)  sevelamer carbonate (RENVELA) tablet 1,600 mg (not administered)  cinacalcet (SENSIPAR) tablet 90 mg (not administered)  MEDLINE mouth rinse (not administered)     ED Discharge Orders    None       Note:  This document was prepared using Dragon voice recognition software and may include unintentional dictation errors.    Hinda Kehr, MD 08/30/17 [phone]

## 2017-08-29 NOTE — ED Triage Notes (Signed)
Pt reports feeling short of breath, more than normal, that started tonight; pt gets dialysis M-W-F; did make it to his appointment today; pt has had fluid pulled of lungs twice, the last time in January; pt says he feels the same now; talking in complete coherent sentences

## 2017-08-29 NOTE — ED Notes (Signed)
Ena Dawley RN at bedside to attempt peripheral IV insertion

## 2017-08-30 ENCOUNTER — Other Ambulatory Visit: Payer: Self-pay

## 2017-08-30 ENCOUNTER — Inpatient Hospital Stay: Payer: Medicare Other

## 2017-08-30 ENCOUNTER — Encounter: Payer: Self-pay | Admitting: *Deleted

## 2017-08-30 DIAGNOSIS — Z79899 Other long term (current) drug therapy: Secondary | ICD-10-CM | POA: Diagnosis not present

## 2017-08-30 DIAGNOSIS — I1311 Hypertensive heart and chronic kidney disease without heart failure, with stage 5 chronic kidney disease, or end stage renal disease: Secondary | ICD-10-CM | POA: Diagnosis not present

## 2017-08-30 DIAGNOSIS — J90 Pleural effusion, not elsewhere classified: Secondary | ICD-10-CM | POA: Diagnosis present

## 2017-08-30 DIAGNOSIS — I252 Old myocardial infarction: Secondary | ICD-10-CM | POA: Diagnosis not present

## 2017-08-30 DIAGNOSIS — Z8546 Personal history of malignant neoplasm of prostate: Secondary | ICD-10-CM | POA: Diagnosis not present

## 2017-08-30 DIAGNOSIS — Z951 Presence of aortocoronary bypass graft: Secondary | ICD-10-CM | POA: Diagnosis not present

## 2017-08-30 DIAGNOSIS — Z96653 Presence of artificial knee joint, bilateral: Secondary | ICD-10-CM | POA: Diagnosis not present

## 2017-08-30 DIAGNOSIS — I2510 Atherosclerotic heart disease of native coronary artery without angina pectoris: Secondary | ICD-10-CM | POA: Diagnosis present

## 2017-08-30 DIAGNOSIS — Z952 Presence of prosthetic heart valve: Secondary | ICD-10-CM | POA: Diagnosis not present

## 2017-08-30 DIAGNOSIS — E785 Hyperlipidemia, unspecified: Secondary | ICD-10-CM | POA: Diagnosis present

## 2017-08-30 DIAGNOSIS — Z7902 Long term (current) use of antithrombotics/antiplatelets: Secondary | ICD-10-CM | POA: Diagnosis not present

## 2017-08-30 DIAGNOSIS — Z91018 Allergy to other foods: Secondary | ICD-10-CM | POA: Diagnosis not present

## 2017-08-30 DIAGNOSIS — M1990 Unspecified osteoarthritis, unspecified site: Secondary | ICD-10-CM | POA: Diagnosis not present

## 2017-08-30 DIAGNOSIS — I10 Essential (primary) hypertension: Secondary | ICD-10-CM | POA: Diagnosis present

## 2017-08-30 DIAGNOSIS — J9601 Acute respiratory failure with hypoxia: Secondary | ICD-10-CM | POA: Diagnosis not present

## 2017-08-30 DIAGNOSIS — Z9861 Coronary angioplasty status: Secondary | ICD-10-CM | POA: Diagnosis not present

## 2017-08-30 DIAGNOSIS — Z880 Allergy status to penicillin: Secondary | ICD-10-CM | POA: Diagnosis not present

## 2017-08-30 DIAGNOSIS — E039 Hypothyroidism, unspecified: Secondary | ICD-10-CM | POA: Diagnosis not present

## 2017-08-30 DIAGNOSIS — Z923 Personal history of irradiation: Secondary | ICD-10-CM | POA: Diagnosis not present

## 2017-08-30 DIAGNOSIS — D631 Anemia in chronic kidney disease: Secondary | ICD-10-CM | POA: Diagnosis not present

## 2017-08-30 DIAGNOSIS — N2581 Secondary hyperparathyroidism of renal origin: Secondary | ICD-10-CM | POA: Diagnosis not present

## 2017-08-30 DIAGNOSIS — Z87891 Personal history of nicotine dependence: Secondary | ICD-10-CM | POA: Diagnosis not present

## 2017-08-30 DIAGNOSIS — N400 Enlarged prostate without lower urinary tract symptoms: Secondary | ICD-10-CM | POA: Diagnosis not present

## 2017-08-30 DIAGNOSIS — Q613 Polycystic kidney, unspecified: Secondary | ICD-10-CM | POA: Diagnosis not present

## 2017-08-30 DIAGNOSIS — N186 End stage renal disease: Secondary | ICD-10-CM | POA: Diagnosis not present

## 2017-08-30 LAB — MAGNESIUM: Magnesium: 2 mg/dL (ref 1.7–2.4)

## 2017-08-30 LAB — CBC
HCT: 31.8 % — ABNORMAL LOW (ref 40.0–52.0)
Hemoglobin: 10.2 g/dL — ABNORMAL LOW (ref 13.0–18.0)
MCH: 27.8 pg (ref 26.0–34.0)
MCHC: 32 g/dL (ref 32.0–36.0)
MCV: 87 fL (ref 80.0–100.0)
PLATELETS: 154 10*3/uL (ref 150–440)
RBC: 3.65 MIL/uL — AB (ref 4.40–5.90)
RDW: 18.2 % — ABNORMAL HIGH (ref 11.5–14.5)
WBC: 5.3 10*3/uL (ref 3.8–10.6)

## 2017-08-30 LAB — BASIC METABOLIC PANEL
Anion gap: 11 (ref 5–15)
BUN: 22 mg/dL — ABNORMAL HIGH (ref 6–20)
CALCIUM: 9.1 mg/dL (ref 8.9–10.3)
CHLORIDE: 99 mmol/L — AB (ref 101–111)
CO2: 30 mmol/L (ref 22–32)
CREATININE: 3.85 mg/dL — AB (ref 0.61–1.24)
GFR calc non Af Amer: 14 mL/min — ABNORMAL LOW (ref >60)
GFR, EST AFRICAN AMERICAN: 16 mL/min — AB (ref >60)
Glucose, Bld: 109 mg/dL — ABNORMAL HIGH (ref 65–99)
Potassium: 3.8 mmol/L (ref 3.5–5.1)
SODIUM: 140 mmol/L (ref 135–145)

## 2017-08-30 LAB — COMPREHENSIVE METABOLIC PANEL
ALK PHOS: 136 U/L — AB (ref 38–126)
ALT: 10 U/L — ABNORMAL LOW (ref 17–63)
ANION GAP: 17 — AB (ref 5–15)
AST: 18 U/L (ref 15–41)
Albumin: 3.6 g/dL (ref 3.5–5.0)
BUN: 17 mg/dL (ref 6–20)
CALCIUM: 9.2 mg/dL (ref 8.9–10.3)
CO2: 26 mmol/L (ref 22–32)
Chloride: 96 mmol/L — ABNORMAL LOW (ref 101–111)
Creatinine, Ser: 3.17 mg/dL — ABNORMAL HIGH (ref 0.61–1.24)
GFR calc non Af Amer: 18 mL/min — ABNORMAL LOW (ref >60)
GFR, EST AFRICAN AMERICAN: 21 mL/min — AB (ref >60)
Glucose, Bld: 84 mg/dL (ref 65–99)
POTASSIUM: 3.9 mmol/L (ref 3.5–5.1)
SODIUM: 139 mmol/L (ref 135–145)
Total Bilirubin: 1 mg/dL (ref 0.3–1.2)
Total Protein: 7.8 g/dL (ref 6.5–8.1)

## 2017-08-30 MED ORDER — ORAL CARE MOUTH RINSE
15.0000 mL | Freq: Two times a day (BID) | OROMUCOSAL | Status: DC
Start: 2017-08-30 — End: 2017-08-31
  Administered 2017-08-30: 15 mL via OROMUCOSAL

## 2017-08-30 MED ORDER — VITAMIN D 1000 UNITS PO TABS
1000.0000 [IU] | ORAL_TABLET | Freq: Every day | ORAL | Status: DC
  Administered 2017-08-30: 1000 [IU] via ORAL
  Filled 2017-08-30: qty 1

## 2017-08-30 MED ORDER — CINACALCET HCL 30 MG PO TABS: 90.0000 mg | ORAL_TABLET | Freq: Every day | ORAL | Status: DC

## 2017-08-30 MED ORDER — CLOPIDOGREL BISULFATE 75 MG PO TABS
75.0000 mg | ORAL_TABLET | Freq: Every day | ORAL | Status: DC
  Administered 2017-08-30: 75 mg via ORAL
  Filled 2017-08-30: qty 1

## 2017-08-30 MED ORDER — CINACALCET HCL 30 MG PO TABS
60.0000 mg | ORAL_TABLET | Freq: Every day | ORAL | Status: DC
  Administered 2017-08-30: 60 mg via ORAL
  Filled 2017-08-30 (×2): qty 2

## 2017-08-30 MED ORDER — METOPROLOL TARTRATE 25 MG PO TABS
12.5000 mg | ORAL_TABLET | Freq: Every morning | ORAL | Status: DC
  Administered 2017-08-30: 12.5 mg via ORAL
  Filled 2017-08-30: qty 1

## 2017-08-30 MED ORDER — ISOSORBIDE MONONITRATE ER 30 MG PO TB24
60.0000 mg | ORAL_TABLET | Freq: Every day | ORAL | Status: DC
  Administered 2017-08-30: 60 mg via ORAL
  Filled 2017-08-30: qty 2

## 2017-08-30 MED ORDER — ACETAMINOPHEN 650 MG RE SUPP: 650.0000 mg | Freq: Four times a day (QID) | RECTAL | Status: DC | PRN

## 2017-08-30 MED ORDER — ROSUVASTATIN CALCIUM 10 MG PO TABS
10.0000 mg | ORAL_TABLET | Freq: Every day | ORAL | Status: DC
  Administered 2017-08-30: 10 mg via ORAL
  Filled 2017-08-30: qty 1

## 2017-08-30 MED ORDER — TRAZODONE HCL 50 MG PO TABS
50.0000 mg | ORAL_TABLET | Freq: Every day | ORAL | Status: DC
  Administered 2017-08-30: 50 mg via ORAL
  Filled 2017-08-30: qty 1

## 2017-08-30 MED ORDER — ONDANSETRON HCL 4 MG PO TABS: 4.0000 mg | ORAL_TABLET | Freq: Four times a day (QID) | ORAL | Status: DC | PRN

## 2017-08-30 MED ORDER — SEVELAMER CARBONATE 800 MG PO TABS: 800.0000 mg | ORAL_TABLET | Freq: Three times a day (TID) | ORAL | Status: DC

## 2017-08-30 MED ORDER — HEPARIN SODIUM (PORCINE) 5000 UNIT/ML IJ SOLN
5000.0000 [IU] | Freq: Three times a day (TID) | INTRAMUSCULAR | Status: DC
  Administered 2017-08-30: 5000 [IU] via SUBCUTANEOUS
  Filled 2017-08-30 (×3): qty 1

## 2017-08-30 MED ORDER — ACETAMINOPHEN 325 MG PO TABS
650.0000 mg | ORAL_TABLET | Freq: Four times a day (QID) | ORAL | Status: DC | PRN
  Administered 2017-08-30 (×2): 650 mg via ORAL
  Filled 2017-08-30 (×2): qty 2

## 2017-08-30 MED ORDER — SEVELAMER CARBONATE 800 MG PO TABS
2400.0000 mg | ORAL_TABLET | Freq: Three times a day (TID) | ORAL | Status: DC
  Administered 2017-08-30 – 2017-08-31 (×4): 2400 mg via ORAL
  Filled 2017-08-30 (×4): qty 3

## 2017-08-30 MED ORDER — ONDANSETRON HCL 4 MG/2ML IJ SOLN: 4.0000 mg | Freq: Four times a day (QID) | INTRAMUSCULAR | Status: DC | PRN

## 2017-08-30 MED ORDER — TAMSULOSIN HCL 0.4 MG PO CAPS
0.4000 mg | ORAL_CAPSULE | Freq: Every day | ORAL | Status: DC
Start: 2017-08-30 — End: 2017-08-31
  Administered 2017-08-30: 0.4 mg via ORAL
  Filled 2017-08-30: qty 1

## 2017-08-30 MED ORDER — SEVELAMER CARBONATE 800 MG PO TABS: 1600.0000 mg | ORAL_TABLET | ORAL | Status: DC | PRN

## 2017-08-30 NOTE — Progress Notes (Signed)
[facility] [facility] [place] NAME: Jimmy Carson    MR#:  [id number]  DATE OF BIRTH:  10-10-1940  SUBJECTIVE:   Came in with increasing shortness of breath.  Found to have mild to moderate left-sided recurrent pleural effusion.  Patient's oxygen sats currently is stable.  He is not in respiratory distress. REVIEW OF SYSTEMS:   Review of Systems  Constitutional: Negative for chills, fever and weight loss.  HENT: Negative for ear discharge, ear pain and nosebleeds.   Eyes: Negative for blurred vision, pain and discharge.  Respiratory: Positive for shortness of breath. Negative for sputum production, wheezing and stridor.   Cardiovascular: Negative for chest pain, palpitations, orthopnea and PND.  Gastrointestinal: Negative for abdominal pain, diarrhea, nausea and vomiting.  Genitourinary: Negative for frequency and urgency.  Musculoskeletal: Negative for back pain and joint pain.  Neurological: Positive for weakness. Negative for sensory change, speech change and focal weakness.  Psychiatric/Behavioral: Negative for depression and hallucinations. The patient is not nervous/anxious.    Tolerating Diet:yes Tolerating PT: not needed  DRUG ALLERGIES:   Allergies  Allergen Reactions  . Orange Fruit [Citrus] Hives  . Penicillins Hives    Has patient had a PCN reaction causing immediate rash, facial/tongue/throat swelling, SOB or lightheadedness with hypotension: Yes Has patient had a PCN reaction causing severe rash involving mucus membranes or skin necrosis: No Has patient had a PCN reaction that required hospitalization No Has patient had a PCN reaction occurring within the last 10 years: No If all of the above answers are "NO", then may proceed with Cephalosporin use.     VITALS:  Blood pressure 105/64, pulse 64, temperature 99.1 F (37.3 C), temperature source Oral, resp. rate 19, height 5\' 10"  (1.778 m), weight 75.1 kg (165 lb  9.6 oz), SpO2 96 %.  PHYSICAL EXAMINATION:   Physical Exam  GENERAL:  77 y.o.-year-old patient lying in the bed with no acute distress.  EYES: Pupils equal, round, reactive to light and accommodation. No scleral icterus. Extraocular muscles intact.  HEENT: Head atraumatic, normocephalic. Oropharynx and nasopharynx clear.  NECK:  Supple, no jugular venous distention. No thyroid enlargement, no tenderness.  LUNGS:decreased breath sounds bilaterally right > left, no wheezing, rales, rhonchi. No use of accessory muscles of respiration.  CARDIOVASCULAR: S1, S2 normal. No murmurs, rubs, or gallops.  ABDOMEN: Soft, nontender, nondistended. Bowel sounds present. No organomegaly or mass.  EXTREMITIES: No cyanosis, clubbing or edema b/l.    NEUROLOGIC: Cranial nerves II through XII are intact. No focal Motor or sensory deficits b/l.   PSYCHIATRIC:  patient is alert and oriented x 3.  SKIN: No obvious rash, lesion, or ulcer.   LABORATORY PANEL:  CBC Recent Labs  Lab 08/30/17 0543  WBC 5.3  HGB 10.2*  HCT 31.8*  PLT 154    Chemistries  Recent Labs  Lab 08/29/17 2352 08/30/17 0543  NA 139 140  K 3.9 3.8  CL 96* 99*  CO2 26 30  GLUCOSE 84 109*  BUN 17 22*  CREATININE 3.17* 3.85*  CALCIUM 9.2 9.1  MG 2.0  --   AST 18  --   ALT 10*  --   ALKPHOS 136*  --   BILITOT 1.0  --    Cardiac Enzymes Recent Labs  Lab 08/29/17 2302  TROPONINI 0.03*   RADIOLOGY:  Dg Chest 2 View  Result Date: 08/29/2017 CLINICAL DATA:  Dyspnea EXAM: CHEST - 2 VIEW COMPARISON:  None. FINDINGS: Stable cardiomegaly  with median sternotomy sutures, post CABG change and evidence of prior aortic valvular replacement. Pulmonary vascular congestion is noted with moderate small left effusion. High-riding right humeral head with tapered appearance of the distal right clavicle may represent stigmata rheumatoid arthritis and/or chronic rotator cuff tear. IMPRESSION: Cardiomegaly with mild vascular congestion and  moderate left effusion. Electronically Signed   By: Ashley Royalty M.D.   On: 08/29/2017 23:16   ASSESSMENT AND PLAN:  Jimmy Carson  is a 77 y.o. male who presents with aggressive dyspnea over the last several days.  Patient states that he has had an effusion in the past and had to have it drained twice.  The first time he states was in January 2018.  Second time was January 2019.    1. Pleural effusion on left -this is likely due to the patient's dialysis and difficult fluid control.  Unclear why reaccumulated so quickly this time. -  We have ordered ultrasound-guided thoracentesis -pt on plavix. Dr Jarvis Newcomer aware  2.  ESRD on dialysis [facility]) -nephrology consult for dialysis support -Had dialysis yesterday.  3.  HTN (hypertension) -continue home antihypertensives    4.  CAD (coronary artery disease) -continue home medications    5.  HLD (hyperlipidemia) -home dose antilipid   Overall appears stable.  Discharge pending thoracentesis procedure.  Case discussed with Care Management/Social Worker. Management plans discussed with the patient, family and they are in agreement.  CODE STATUS: full  DVT Prophylaxis: heparin  TOTAL TIME TAKING CARE OF THIS PATIENT: 30 minutes.  >50% time spent on counselling and coordination of care  POSSIBLE D/C IN 1 DAYS, DEPENDING ON CLINICAL CONDITION.  Note: This dictation was prepared with Dragon dictation along with smaller phrase technology. Any transcriptional errors that result from this process are unintentional.  Fritzi Mandes M.D on 08/30/2017 at 1:30 PM  Between 7am to 6pm - Pager - [phone]  After 6pm go to [email or web address] - password [email or web address] [facility]  Office  [phone]  CC: Primary care physician; Dion Body, MDPatient ID: Jimmy Carson, male   DOB: 10/20/1940, 77 y.o.   MRN: [id number]

## 2017-08-30 NOTE — ED Notes (Signed)
Ambulated patient per MD Karma Greaser request. Patient's oxygen saturation decreased from 96% on RA to 87%. Patient became dyspneic, with labored breathing. Patients respiratory rate increased drom 19 to 31 breaths/min. Patients heart rate increased from 90 to 110 bpm. MD Karma Greaser informed.   Patient placed on 2L [place] per MD Karma Greaser request.

## 2017-08-30 NOTE — Procedures (Signed)
PROCEDURE SUMMARY:  Successful US guided left thoracentesis. Yielded 1.4 L of clear amber colored fluid. Pt tolerated procedure well. No immediate complications.  Specimen was not sent for labs. CXR ordered.  Ascencion Dike PA-C 08/30/2017 1:32 PM

## 2017-08-30 NOTE — Progress Notes (Signed)
[facility], [place] 08/30/17  Subjective:   Patient presents for continued shortness of breath He said he was unable to breathe yesterday Currently on room air Chest x-ray shows moderate left pleural effusion Patient is scheduled for thoracentesis   Objective:  Vital signs in last 24 hours:  Temp:  [98 F (36.7 C)-99.1 F (37.3 C)] 99.1 F (37.3 C) (03/09 0144) Pulse Rate:  [70-110] 70 (03/09 0144) Resp:  [11-31] 16 (03/09 0144) BP: (145-157)/(66-81) 147/68 (03/09 0144) SpO2:  [87 %-100 %] 98 % (03/09 0144) Weight:  [75.1 kg (165 lb 9.6 oz)-78.9 kg (174 lb)] 75.1 kg (165 lb 9.6 oz) (03/09 0144)  Weight change:  Filed Weights   08/29/17 2244 08/30/17 0144  Weight: 78.9 kg (174 lb) 75.1 kg (165 lb 9.6 oz)    Intake/Output:    Intake/Output Summary (Last 24 hours) at 08/30/2017 0956 Last data filed at 08/30/2017 0601 Gross per 24 hour  Intake -  Output 100 ml  Net -100 ml     Physical Exam: General:  Sitting up in the chair, no acute distress  HEENT  anicteric, moist oral mucous membranes  Neck  supple  Pulm/lungs  decreased breath sounds left lung base  CVS/Heart  soft systolic murmur  Abdomen:   Soft, nontender  Extremities:  No edema  Neurologic:  Alert, oriented  Skin:  No acute rashes  Access:  Left forearm AV fistula       Basic Metabolic Panel:  Recent Labs  Lab 08/29/17 2352 08/30/17 0543  NA 139 140  K 3.9 3.8  CL 96* 99*  CO2 26 30  GLUCOSE 84 109*  BUN 17 22*  CREATININE 3.17* 3.85*  CALCIUM 9.2 9.1  MG 2.0  --      CBC: Recent Labs  Lab 08/29/17 2302 08/30/17 0543  WBC 6.6 5.3  NEUTROABS 5.4  --   HGB 11.5* 10.2*  HCT 36.0* 31.8*  MCV 88.0 87.0  PLT 226 154     No results found for: HEPBSAG, HEPBSAB, HEPBIGM    Microbiology:  No results found for this or any previous visit (from the past 240 hour(s)).  Coagulation Studies: No results for input(s): LABPROT, INR in the last 72  hours.  Urinalysis: No results for input(s): COLORURINE, LABSPEC, PHURINE, GLUCOSEU, HGBUR, BILIRUBINUR, KETONESUR, PROTEINUR, UROBILINOGEN, NITRITE, LEUKOCYTESUR in the last 72 hours.  Invalid input(s): APPERANCEUR    Imaging: Dg Chest 2 View  Result Date: 08/29/2017 CLINICAL DATA:  Dyspnea EXAM: CHEST - 2 VIEW COMPARISON:  None. FINDINGS: Stable cardiomegaly with median sternotomy sutures, post CABG change and evidence of prior aortic valvular replacement. Pulmonary vascular congestion is noted with moderate small left effusion. High-riding right humeral head with tapered appearance of the distal right clavicle may represent stigmata rheumatoid arthritis and/or chronic rotator cuff tear. IMPRESSION: Cardiomegaly with mild vascular congestion and moderate left effusion. Electronically Signed   By: Ashley Royalty M.D.   On: 08/29/2017 23:16     Medications:    . cholecalciferol  1,000 Units Oral Daily  . cinacalcet  60 mg Oral Daily  . clopidogrel  75 mg Oral Daily  . heparin  5,000 Units Subcutaneous Q8H  . isosorbide mononitrate  60 mg Oral Daily  . mouth rinse  15 mL Mouth Rinse BID  . metoprolol tartrate  12.5 mg Oral q morning - 10a  . rosuvastatin  10 mg Oral Daily  . sevelamer carbonate  2,400 mg Oral TID WC  . tamsulosin  0.4 mg Oral Daily  . traZODone  50 mg Oral QHS   acetaminophen **OR** acetaminophen, ondansetron **OR** ondansetron (ZOFRAN) IV, sevelamer carbonate  Assessment/ Plan:  77 y.o. male with hypothyroidism, hypertension, BPH, CAD status post CABG, hyperlipidemia.   [facility] [place] MWF  1.  End-stage renal disease 2.  Shortness of breath from left pleural effusion 3.  Secondary hyperparathyroidism 4.  Anemia of chronic kidney disease  Patient likely has ongoing shortness of breath from left pleural effusion.  He states that he routinely asks dialysis staff to take extra fluid off of him.  That has not helped him in terms of shortness of breath  and resolution of pleural effusion. He completed his routing HD yesterday. At present, he does not have lower extremity edema.  Currently on room air.  He is scheduled for thoracentesis today. Electrolytes and volume status are acceptable.  No acute indication for dialysis at present.  We will plan for dialysis on Monday if patient is still in the hospital.   LOS: 0 Shamaria Kavan Candiss Norse 3/9/20199:56 AM  [place], [place]  Note: This note was prepared with Dragon dictation. Any transcription errors are unintentional

## 2017-08-30 NOTE — H&P (Addendum)
[facility] [place] NAME: Jimmy Carson    MR#:  [id number]  DATE OF BIRTH:  01-31-1941  DATE OF ADMISSION:  08/29/2017  PRIMARY CARE PHYSICIAN: Dion Body, MD   REQUESTING/REFERRING PHYSICIAN: Karma Greaser, MD  CHIEF COMPLAINT:   Chief Complaint  Patient presents with  . Shortness of Breath    HISTORY OF PRESENT ILLNESS:  Jimmy Carson  is a 77 y.o. male who presents with aggressive dyspnea over the last several days.  Patient states that he has had an effusion in the past and had to have it drained twice.  The first time he states was in January 2018.  Second time was January 2019.  This time, he states that he feels like it has recurred much faster over only the past 2 months.  X-ray imaging here in the ED confirms left-sided moderate pleural effusion.  Hospitalist were called for admission.  PAST MEDICAL HISTORY:   Past Medical History:  Diagnosis Date  . Anemia   . CAD (coronary artery disease)    s/p percutaneous coronary angioplasty, s/p CABG x 6   . ESRD (end stage renal disease) ([place])   . Heart attack   . History of radiation therapy    for prostate CA  . Hyperlipidemia   . Hyperparathyroidism, secondary renal ([place])   . Hypertension   . Osteoarthritis   . Polycystic kidney disease   . Prostate CA [facility]) 2012   Brachytherapy  . Renal insufficiency    currently on dialysis  . Shingles      PAST SURGICAL HISTORY:   Past Surgical History:  Procedure Laterality Date  . AORTIC VALVE REPLACEMENT    . CARDIAC CATHETERIZATION N/A 07/24/2015   Procedure: Coronary/Graft Angiography;  Surgeon: Corey Skains, MD;  Location: [place] [facility];  Service: Cardiovascular;  Laterality: N/A;  . CARDIAC CATHETERIZATION N/A 06/03/2016   Procedure: Left Heart Cath and Coronary Angiography;  Surgeon: Isaias Cowman, MD;  Location: [place] [facility];  Service: Cardiovascular;  Laterality: N/A;  .  CORONARY ARTERY BYPASS GRAFT    . JOINT REPLACEMENT    . REPLACEMENT TOTAL KNEE BILATERAL    . ROTATOR CUFF REPAIR Right      SOCIAL HISTORY:   Social History   Tobacco Use  . Smoking status: Former Smoker    Last attempt to quit: 04/19/2013    Years since quitting: 4.3  . Smokeless tobacco: Never Used  Substance Use Topics  . Alcohol use: No     FAMILY HISTORY:   Family History  Problem Relation Age of Onset  . Polycystic kidney disease Father   . Aneurysm Father      DRUG ALLERGIES:   Allergies  Allergen Reactions  . Orange Fruit [Citrus] Hives  . Penicillins Hives    Has patient had a PCN reaction causing immediate rash, facial/tongue/throat swelling, SOB or lightheadedness with hypotension: Yes Has patient had a PCN reaction causing severe rash involving mucus membranes or skin necrosis: No Has patient had a PCN reaction that required hospitalization No Has patient had a PCN reaction occurring within the last 10 years: No If all of the above answers are "NO", then may proceed with Cephalosporin use.     MEDICATIONS AT HOME:   Prior to Admission medications   Medication Sig Start Date End Date Taking? Authorizing Provider  acetaminophen (TYLENOL) 325 MG tablet Take 650 mg by mouth every 6 (six) hours as needed.  [provider]  cholecalciferol (VITAMIN D) 400 UNITS TABS tablet Take 1,000 Units by mouth.    [provider]  cinacalcet (SENSIPAR) 90 MG tablet Take 90 mg by mouth daily.    [provider]  clopidogrel (PLAVIX) 75 MG tablet Take 75 mg by mouth daily.    [provider]  isosorbide mononitrate (IMDUR) 60 MG 24 hr tablet Take 60 mg by mouth daily.    [provider]  metoprolol tartrate (LOPRESSOR) 25 MG tablet Take 0.5 tablets (12.5 mg total) by mouth every morning. 06/03/16   Max Sane, MD  nitroGLYCERIN (NITROSTAT) 0.4 MG SL tablet Place 1 tablet (0.4 mg total) under the tongue every 5 (five)  minutes as needed for chest pain. 06/03/16   Max Sane, MD  predniSONE (DELTASONE) 20 MG tablet Take 2 tablets (40 mg total) by mouth daily. 07/25/16   Harvest Dark, MD  rosuvastatin (CRESTOR) 10 MG tablet Take 10 mg by mouth daily.    [provider]  sevelamer carbonate (RENVELA) 800 MG tablet Take 800-2,400 mg by mouth 3 (three) times daily with meals. How many he takes with his meals depends on what he's eating    [provider]  tamsulosin (FLOMAX) 0.4 MG CAPS capsule Take 0.4 mg by mouth.    [provider]  traZODone (DESYREL) 50 MG tablet Take 50 mg by mouth at bedtime.    [provider]    REVIEW OF SYSTEMS:  Review of Systems  Constitutional: Negative for chills, fever, malaise/fatigue and weight loss.  HENT: Negative for ear pain, hearing loss and tinnitus.   Eyes: Negative for blurred vision, double vision, pain and redness.  Respiratory: Positive for shortness of breath. Negative for cough and hemoptysis.   Cardiovascular: Negative for chest pain, palpitations, orthopnea and leg swelling.  Gastrointestinal: Negative for abdominal pain, constipation, diarrhea, nausea and vomiting.  Genitourinary: Negative for dysuria, frequency and hematuria.  Musculoskeletal: Negative for back pain, joint pain and neck pain.  Skin:       No acne, rash, or lesions  Neurological: Negative for dizziness, tremors, focal weakness and weakness.  Endo/Heme/Allergies: Negative for polydipsia. Does not bruise/bleed easily.  Psychiatric/Behavioral: Negative for depression. The patient is not nervous/anxious and does not have insomnia.      VITAL SIGNS:   Vitals:   08/29/17 2244 08/30/17 0005 08/30/17 0015  BP: (!) 157/66    Pulse: 92 (!) 110 92  Resp: 20 (!) 31 18  Temp: 98 F (36.7 C)    TempSrc: Oral    SpO2: 96% (!) 87% 100%  Weight: 78.9 kg (174 lb)    Height: 5\' 10"  (1.778 m)     Wt Readings from Last 3 Encounters:  08/29/17 78.9 kg (174 lb)   07/25/16 80.3 kg (177 lb)  06/03/16 76.5 kg (168 lb 10.4 oz)    PHYSICAL EXAMINATION:  Physical Exam  Vitals reviewed. Constitutional: He is oriented to person, place, and time. He appears well-developed and well-nourished. No distress.  HENT:  Head: Normocephalic and atraumatic.  Mouth/Throat: Oropharynx is clear and moist.  Eyes: Conjunctivae and EOM are normal. Pupils are equal, round, and reactive to light. No scleral icterus.  Neck: Normal range of motion. Neck supple. No JVD present. No thyromegaly present.  Cardiovascular: Normal rate, regular rhythm and intact distal pulses. Exam reveals no gallop and no friction rub.  Murmur heard. Respiratory: Effort normal. No respiratory distress. He has no wheezes. He has no rales.  Left basilar  and mid lobe diminished breath sounds  GI: Soft. Bowel sounds are normal. He exhibits no distension. There is no tenderness.  Musculoskeletal: Normal range of motion. He exhibits no edema.  No arthritis, no gout  Lymphadenopathy:    He has no cervical adenopathy.  Neurological: He is alert and oriented to person, place, and time. No cranial nerve deficit.  No dysarthria, no aphasia  Skin: Skin is warm and dry. No rash noted. No erythema.  Psychiatric: He has a normal mood and affect. His behavior is normal. Judgment and thought content normal.    LABORATORY PANEL:   CBC Recent Labs  Lab 08/29/17 2302  WBC 6.6  HGB 11.5*  HCT 36.0*  PLT 226   ------------------------------------------------------------------------------------------------------------------  Chemistries  No results for input(s): NA, K, CL, CO2, GLUCOSE, BUN, CREATININE, CALCIUM, MG, AST, ALT, ALKPHOS, BILITOT in the last 168 hours.  Invalid input(s): GFRCGP ------------------------------------------------------------------------------------------------------------------  Cardiac Enzymes Recent Labs  Lab 08/29/17 2302  TROPONINI 0.03*    ------------------------------------------------------------------------------------------------------------------  RADIOLOGY:  Dg Chest 2 View  Result Date: 08/29/2017 CLINICAL DATA:  Dyspnea EXAM: CHEST - 2 VIEW COMPARISON:  None. FINDINGS: Stable cardiomegaly with median sternotomy sutures, post CABG change and evidence of prior aortic valvular replacement. Pulmonary vascular congestion is noted with moderate small left effusion. High-riding right humeral head with tapered appearance of the distal right clavicle may represent stigmata rheumatoid arthritis and/or chronic rotator cuff tear. IMPRESSION: Cardiomegaly with mild vascular congestion and moderate left effusion. Electronically Signed   By: Ashley Royalty M.D.   On: 08/29/2017 23:16    EKG:   Orders placed or performed during the hospital encounter of 08/29/17  . EKG 12-Lead  . EKG 12-Lead    IMPRESSION AND PLAN:  Principal Problem:   Pleural effusion on left -this is likely due to the patient's dialysis and difficult fluid control.  Unclear why reaccumulated so quickly this time.  We have ordered ultrasound-guided thoracentesis Active Problems:   ESRD on dialysis [facility]) -nephrology consult for dialysis support   HTN (hypertension) -continue home antihypertensives   CAD (coronary artery disease) -continue home medications   HLD (hyperlipidemia) -home dose antilipid  Chart review performed and case discussed with ED provider. Labs, imaging and ECG reviewed by provider and discussed with patient/family. Management plans discussed with the patient and/or family.  DVT PROPHYLAXIS: SubQ heparin  GI PROPHYLAXIS: None  ADMISSION STATUS: Observation  CODE STATUS: Full Code Status History    Date Active Date Inactive Code Status Order ID Comments User Context   05/31/2016 20:36 06/04/2016 00:53 Full Code [id number]  Henreitta Leber, MD ED   07/22/2015 00:02 07/24/2015 22:25 Full Code [id number]  Harrie Foreman, MD ED     Advance Directive Documentation     Most Recent Value  Type of Advance Directive  Healthcare Power of Attorney, Living will  Pre-existing out of facility DNR order (yellow form or pink MOST form)  No data  "MOST" Form in Place?  No data      TOTAL TIME TAKING CARE OF THIS PATIENT: 40 minutes.   Shearon Clonch FIELDING 08/30/2017, 12:34 AM  [facility]  Office  [phone]  CC: Primary care physician; Dion Body, MD  Note:  This document was prepared using Dragon voice recognition software and may include unintentional dictation errors.

## 2017-08-31 NOTE — Discharge Summary (Signed)
[facility] [place] NAME: Jimmy Carson    MR#:  [id number]  DATE OF BIRTH:  January 29, 1941  DATE OF ADMISSION:  08/29/2017 ADMITTING PHYSICIAN: Lance Coon, MD  DATE OF DISCHARGE: 08/31/2017  PRIMARY CARE PHYSICIAN: Dion Body, MD    ADMISSION DIAGNOSIS:  Recurrent left pleural effusion [J90] Acute respiratory failure with hypoxemia (HCC) [J96.01]  DISCHARGE DIAGNOSIS:  Recurrent left pleural effusion status post thoracentesis with removal of 1.4 L fluid Acute respiratory failure with hypoxemia resolved SECONDARY DIAGNOSIS:   Past Medical History:  Diagnosis Date  . Anemia   . CAD (coronary artery disease)    s/p percutaneous coronary angioplasty, s/p CABG x 6   . ESRD (end stage renal disease) ([place])   . Heart attack ([place])   . History of radiation therapy    for prostate CA  . Hyperlipidemia   . Hyperparathyroidism, secondary renal ([place])   . Hypertension   . Osteoarthritis   . Polycystic kidney disease   . Prostate CA [facility]) 2012   Brachytherapy  . Renal insufficiency    currently on dialysis  . Shingles     HOSPITAL COURSE:   [name] a77 y.o.malewho presents with aggressive dyspnea over the last several days. Patient states that he has had an effusion in the past and had to have it drained twice. The first time he states was in January 2018. Second time was January 2019.   1. acute hypoxic respiratory failure secondary to recurrent Pleural effusion on left -this is likely due to the patient's dialysis and difficult fluid control. Unclear why reaccumulated so quickly this time. -  pt is s/p ultrasound-guided thoracentesis--removal of 1.4 L fluid -Patient follows with Dr. Raul Del as outpatient.  He has appointment on September 02, 2017 for which he is advised to keep. -Sats 9 92-94% on room air--weaned off oxygen  2.ESRD on dialysis [facility]) -nephrology consult for dialysis  support -Had dialysis yesterday.  3.HTN (hypertension) -continue home antihypertensives  4.  CAD (coronary artery disease) -continue home medications  5.  HLD (hyperlipidemia) -home dose antilipid  Overall stable.  Discharge home  CONSULTS OBTAINED:  Treatment Team:  Murlean Iba, MD  DRUG ALLERGIES:   Allergies  Allergen Reactions  . Orange Fruit [Citrus] Hives  . Penicillins Hives    Has patient had a PCN reaction causing immediate rash, facial/tongue/throat swelling, SOB or lightheadedness with hypotension: Yes Has patient had a PCN reaction causing severe rash involving mucus membranes or skin necrosis: No Has patient had a PCN reaction that required hospitalization No Has patient had a PCN reaction occurring within the last 10 years: No If all of the above answers are "NO", then may proceed with Cephalosporin use.     DISCHARGE MEDICATIONS:   Allergies as of 08/31/2017      Reactions   Orange Fruit [citrus] Hives   Penicillins Hives   Has patient had a PCN reaction causing immediate rash, facial/tongue/throat swelling, SOB or lightheadedness with hypotension: Yes Has patient had a PCN reaction causing severe rash involving mucus membranes or skin necrosis: No Has patient had a PCN reaction that required hospitalization No Has patient had a PCN reaction occurring within the last 10 years: No If all of the above answers are "NO", then may proceed with Cephalosporin use.      Medication List    TAKE these medications   acetaminophen 325 MG tablet Commonly known as:  TYLENOL Take 650 mg by mouth every 4 (  four) hours as needed for mild pain.   b complex-C-folic acid 1 MG capsule Take 1 capsule by mouth daily.   cholecalciferol 400 units Tabs tablet Commonly known as:  VITAMIN D Take 1,000 Units by mouth.   cinacalcet 60 MG tablet Commonly known as:  SENSIPAR Take 60 mg by mouth daily.   clopidogrel 75 MG tablet Commonly known as:  PLAVIX Take 75 mg  by mouth daily.   isosorbide mononitrate 60 MG 24 hr tablet Commonly known as:  IMDUR Take 60 mg by mouth daily.   nitroGLYCERIN 0.4 MG SL tablet Commonly known as:  NITROSTAT Place 1 tablet (0.4 mg total) under the tongue every 5 (five) minutes as needed for chest pain.   rosuvastatin 10 MG tablet Commonly known as:  CRESTOR Take 10 mg by mouth daily.   sevelamer carbonate 800 MG tablet Commonly known as:  RENVELA Take 2,400 mg by mouth 3 (three) times daily with meals. How many he takes with his meals depends on what he's eating   sevelamer carbonate 800 MG tablet Commonly known as:  RENVELA Take 1,600 mg by mouth as needed (with snacks).   tamsulosin 0.4 MG Caps capsule Commonly known as:  FLOMAX Take 0.4 mg by mouth.   traZODone 50 MG tablet Commonly known as:  DESYREL Take 50 mg by mouth at bedtime.       If you experience worsening of your admission symptoms, develop shortness of breath, life threatening emergency, suicidal or homicidal thoughts you must seek medical attention immediately by calling 911 or calling your MD immediately  if symptoms less severe.  You Must read complete instructions/literature along with all the possible adverse reactions/side effects for all the Medicines you take and that have been prescribed to you. Take any new Medicines after you have completely understood and accept all the possible adverse reactions/side effects.   Please note  You were cared for by a hospitalist during your hospital stay. If you have any questions about your discharge medications or the care you received while you were in the hospital after you are discharged, you can call the unit and asked to speak with the hospitalist on call if the hospitalist that took care of you is not available. Once you are discharged, your primary care physician will handle any further medical issues. Please note that NO REFILLS for any discharge medications will be authorized once you are  discharged, as it is imperative that you return to your primary care physician (or establish a relationship with a primary care physician if you do not have one) for your aftercare needs so that they can reassess your need for medications and monitor your lab values. Today   SUBJECTIVE   Doing well  VITAL SIGNS:  Blood pressure 114/61, pulse 78, temperature 98.3 F (36.8 C), temperature source Oral, resp. rate 18, height 5\' 10"  (1.778 m), weight 75.1 kg (165 lb 9.6 oz), SpO2 94 %.  I/O:    Intake/Output Summary (Last 24 hours) at 08/31/2017 0831 Last data filed at 08/30/2017 2242 Gross per 24 hour  Intake 660 ml  Output 400 ml  Net 260 ml    PHYSICAL EXAMINATION:  GENERAL:  77 y.o.-year-old patient lying in the bed with no acute distress.  EYES: Pupils equal, round, reactive to light and accommodation. No scleral icterus. Extraocular muscles intact.  HEENT: Head atraumatic, normocephalic. Oropharynx and nasopharynx clear.  NECK:  Supple, no jugular venous distention. No thyroid enlargement, no tenderness.  LUNGS: Normal breath  sounds bilaterally, no wheezing, rales,rhonchi or crepitation. No use of accessory muscles of respiration.  CARDIOVASCULAR: S1, S2 normal. No murmurs, rubs, or gallops.  ABDOMEN: Soft, non-tender, non-distended. Bowel sounds present. No organomegaly or mass.  EXTREMITIES: No pedal edema, cyanosis, or clubbing.  NEUROLOGIC: Cranial nerves II through XII are intact. Muscle strength 5/5 in all extremities. Sensation intact. Gait not checked.  PSYCHIATRIC: The patient is alert and oriented x 3.  SKIN: No obvious rash, lesion, or ulcer.   DATA REVIEW:   CBC  Recent Labs  Lab 08/30/17 0543  WBC 5.3  HGB 10.2*  HCT 31.8*  PLT 154    Chemistries  Recent Labs  Lab 08/29/17 2352 08/30/17 0543  NA 139 140  K 3.9 3.8  CL 96* 99*  CO2 26 30  GLUCOSE 84 109*  BUN 17 22*  CREATININE 3.17* 3.85*  CALCIUM 9.2 9.1  MG 2.0  --   AST 18  --   ALT 10*  --    ALKPHOS 136*  --   BILITOT 1.0  --     Microbiology Results   No results found for this or any previous visit (from the past 240 hour(s)).  RADIOLOGY:  Dg Chest 1 View  Result Date: 08/30/2017 CLINICAL DATA:  Post thoracentesis.  1.4 L. EXAM: CHEST 1 VIEW COMPARISON:  Chest x-ray dated 08/29/2017 FINDINGS: Improved aeration at the left lung base status post thoracentesis. Small residual opacity at the left lung base, residual pleural effusion and/or atelectasis. Right lung is clear. No pneumothorax seen. Cardiomegaly is stable. Prominent calcifications in the left neck, incompletely imaged. Atherosclerotic changes again noted at the aortic arch. IMPRESSION: 1. Improved aeration at the left lung base status post thoracentesis. Small residual opacity at the left lung base, compatible with small residual effusion and/or atelectasis. No pneumothorax. 2. Stable cardiomegaly. 3. Prominent calcifications in the left neck, presumably carotid atherosclerosis. 4. Aortic atherosclerosis. Electronically Signed   By: Franki Cabot M.D.   On: 08/30/2017 13:49   Dg Chest 2 View  Result Date: 08/29/2017 CLINICAL DATA:  Dyspnea EXAM: CHEST - 2 VIEW COMPARISON:  None. FINDINGS: Stable cardiomegaly with median sternotomy sutures, post CABG change and evidence of prior aortic valvular replacement. Pulmonary vascular congestion is noted with moderate small left effusion. High-riding right humeral head with tapered appearance of the distal right clavicle may represent stigmata rheumatoid arthritis and/or chronic rotator cuff tear. IMPRESSION: Cardiomegaly with mild vascular congestion and moderate left effusion. Electronically Signed   By: Ashley Royalty M.D.   On: 08/29/2017 23:16   US Thoracentesis Asp Pleural Space W/img Guide  Result Date: 08/30/2017 INDICATION: Shortness of breath. Cough. Recurrent left pleural effusion. Request for therapeutic thoracentesis. EXAM: ULTRASOUND GUIDED LEFT THORACENTESIS MEDICATIONS:  None. COMPLICATIONS: None immediate. Postprocedural chest x-ray negative for pneumothorax. PROCEDURE: An ultrasound guided thoracentesis was thoroughly discussed with the patient and questions answered. The benefits, risks, alternatives and complications were also discussed. The patient understands and wishes to proceed with the procedure. Written consent was obtained. Ultrasound was performed to localize and mark an adequate pocket of fluid in the left chest. The area was then prepped and draped in the normal sterile fashion. 1% Lidocaine was used for local anesthesia. Under ultrasound guidance a Safe-T-Centesis catheter was introduced. Thoracentesis was performed. The catheter was removed and a dressing applied. FINDINGS: A total of approximately 1.4 L of clear, amber colored fluid was removed. IMPRESSION: Successful ultrasound guided left thoracentesis yielding 1.4 L of pleural fluid. Read by:  Ascencion Dike PA-C Electronically Signed   By: Lucrezia Europe M.D.   On: 08/30/2017 13:53     Management plans discussed with the patient, family and they are in agreement.  CODE STATUS:     Code Status Orders  (From admission, onward)        Start     Ordered   08/30/17 0207  Full code  Continuous     08/30/17 0206    Code Status History    Date Active Date Inactive Code Status Order ID Comments User Context   05/31/2016 20:36 06/04/2016 00:53 Full Code [id number]  Henreitta Leber, MD ED   07/22/2015 00:02 07/24/2015 22:25 Full Code [id number]  Harrie Foreman, MD ED    Advance Directive Documentation     Most Recent Value  Type of Advance Directive  Living will, Healthcare Power of Attorney  Pre-existing out of facility DNR order (yellow form or pink MOST form)  No data  "MOST" Form in Place?  No data      TOTAL TIME TAKING CARE OF THIS PATIENT: *40* minutes.    Jimmy Carson M.D on 08/31/2017 at 8:31 AM  Between 7am to 6pm - Pager - [phone] After 6pm go to [email or web address] - password [email or web address]  [facility]  Office  [phone]  CC: Primary care physician; Dion Body, MD

## 2017-08-31 NOTE — Discharge Instructions (Signed)
Resume your hemodialysis as before on her outpatient schedule

## 2017-09-06 LAB — ACID FAST CULTURE WITH REFLEXED SENSITIVITIES

## 2017-09-06 LAB — ACID FAST CULTURE WITH REFLEXED SENSITIVITIES (MYCOBACTERIA): Acid Fast Culture: NEGATIVE

## 2017-12-08 ENCOUNTER — Other Ambulatory Visit
Admission: RE | Admit: 2017-12-08 | Discharge: 2017-12-08 | Disposition: A | Discharge status: Home / Self Care | Payer: Medicare Other | Source: Ambulatory Visit | Attending: Nephrology | Admitting: Nephrology

## 2017-12-08 DIAGNOSIS — E875 Hyperkalemia: Secondary | ICD-10-CM | POA: Insufficient documentation

## 2017-12-08 LAB — POTASSIUM: POTASSIUM: 5.5 mmol/L — AB (ref 3.5–5.1)

## 2018-03-03 ENCOUNTER — Other Ambulatory Visit: Payer: Self-pay | Admitting: Specialist

## 2018-03-03 DIAGNOSIS — R0602 Shortness of breath: Secondary | ICD-10-CM

## 2018-03-03 DIAGNOSIS — J986 Disorders of diaphragm: Secondary | ICD-10-CM

## 2018-03-17 ENCOUNTER — Ambulatory Visit
Admission: RE | Admit: 2018-03-17 | Discharge: 2018-03-17 | Disposition: A | Discharge status: Home / Self Care | Payer: Medicare Other | Source: Ambulatory Visit | Attending: Specialist | Admitting: Specialist

## 2018-03-17 DIAGNOSIS — R0602 Shortness of breath: Secondary | ICD-10-CM | POA: Insufficient documentation

## 2018-03-17 DIAGNOSIS — J986 Disorders of diaphragm: Secondary | ICD-10-CM | POA: Diagnosis not present

## 2018-03-18 ENCOUNTER — Other Ambulatory Visit: Payer: Self-pay | Admitting: Physician Assistant

## 2018-03-18 DIAGNOSIS — Z96651 Presence of right artificial knee joint: Secondary | ICD-10-CM

## 2018-04-02 ENCOUNTER — Ambulatory Visit
Admission: RE | Admit: 2018-04-02 | Discharge: 2018-04-02 | Disposition: A | Discharge status: Home / Self Care | Payer: Medicare Other | Source: Ambulatory Visit | Attending: Physician Assistant | Admitting: Physician Assistant

## 2018-04-02 DIAGNOSIS — M89551 Osteolysis, right thigh: Secondary | ICD-10-CM | POA: Insufficient documentation

## 2018-04-02 DIAGNOSIS — M25461 Effusion, right knee: Secondary | ICD-10-CM | POA: Insufficient documentation

## 2018-04-02 DIAGNOSIS — M25561 Pain in right knee: Secondary | ICD-10-CM | POA: Diagnosis present

## 2018-04-02 DIAGNOSIS — Z96651 Presence of right artificial knee joint: Secondary | ICD-10-CM

## 2018-04-03 ENCOUNTER — Other Ambulatory Visit
Admission: RE | Admit: 2018-04-03 | Discharge: 2018-04-03 | Disposition: A | Discharge status: Home / Self Care | Payer: Medicare Other | Source: Ambulatory Visit | Attending: Orthopedic Surgery | Admitting: Orthopedic Surgery

## 2018-04-03 DIAGNOSIS — M25461 Effusion, right knee: Secondary | ICD-10-CM | POA: Insufficient documentation

## 2018-04-06 ENCOUNTER — Emergency Department: Payer: Medicare Other

## 2018-04-06 ENCOUNTER — Other Ambulatory Visit: Payer: Self-pay

## 2018-04-06 ENCOUNTER — Emergency Department
Admission: EM | Admit: 2018-04-06 | Discharge: 2018-04-06 | Disposition: A | Discharge status: Home / Self Care | Payer: Medicare Other | Attending: Emergency Medicine | Admitting: Emergency Medicine

## 2018-04-06 DIAGNOSIS — Z79899 Other long term (current) drug therapy: Secondary | ICD-10-CM | POA: Diagnosis not present

## 2018-04-06 DIAGNOSIS — Z87891 Personal history of nicotine dependence: Secondary | ICD-10-CM | POA: Diagnosis not present

## 2018-04-06 DIAGNOSIS — R0789 Other chest pain: Secondary | ICD-10-CM | POA: Diagnosis not present

## 2018-04-06 DIAGNOSIS — N186 End stage renal disease: Secondary | ICD-10-CM | POA: Insufficient documentation

## 2018-04-06 DIAGNOSIS — I120 Hypertensive chronic kidney disease with stage 5 chronic kidney disease or end stage renal disease: Secondary | ICD-10-CM | POA: Diagnosis not present

## 2018-04-06 DIAGNOSIS — Z951 Presence of aortocoronary bypass graft: Secondary | ICD-10-CM | POA: Diagnosis not present

## 2018-04-06 DIAGNOSIS — Z96653 Presence of artificial knee joint, bilateral: Secondary | ICD-10-CM | POA: Insufficient documentation

## 2018-04-06 DIAGNOSIS — Z7902 Long term (current) use of antithrombotics/antiplatelets: Secondary | ICD-10-CM | POA: Insufficient documentation

## 2018-04-06 DIAGNOSIS — I2510 Atherosclerotic heart disease of native coronary artery without angina pectoris: Secondary | ICD-10-CM | POA: Diagnosis not present

## 2018-04-06 LAB — CBC
HCT: 33 % — ABNORMAL LOW (ref 39.0–52.0)
HEMOGLOBIN: 10.1 g/dL — AB (ref 13.0–17.0)
MCH: 26.8 pg (ref 26.0–34.0)
MCHC: 30.6 g/dL (ref 30.0–36.0)
MCV: 87.5 fL (ref 80.0–100.0)
Platelets: 160 10*3/uL (ref 150–400)
RBC: 3.77 MIL/uL — AB (ref 4.22–5.81)
RDW: 18.6 % — ABNORMAL HIGH (ref 11.5–15.5)
WBC: 5.7 10*3/uL (ref 4.0–10.5)
nRBC: 0 % (ref 0.0–0.2)

## 2018-04-06 LAB — BASIC METABOLIC PANEL
ANION GAP: 14 (ref 5–15)
BUN: 20 mg/dL (ref 8–23)
CO2: 30 mmol/L (ref 22–32)
Calcium: 7.9 mg/dL — ABNORMAL LOW (ref 8.9–10.3)
Chloride: 93 mmol/L — ABNORMAL LOW (ref 98–111)
Creatinine, Ser: 4.13 mg/dL — ABNORMAL HIGH (ref 0.61–1.24)
GFR calc Af Amer: 15 mL/min — ABNORMAL LOW (ref >60)
GFR, EST NON AFRICAN AMERICAN: 13 mL/min — AB (ref >60)
GLUCOSE: 102 mg/dL — AB (ref 70–99)
POTASSIUM: 3.2 mmol/L — AB (ref 3.5–5.1)
Sodium: 137 mmol/L (ref 135–145)

## 2018-04-06 LAB — TROPONIN I
Troponin I: <0.03 ng/mL (ref <0.03)
Troponin I: <0.03 ng/mL (ref <0.03)

## 2018-04-06 LAB — MAGNESIUM: Magnesium: 2.2 mg/dL (ref 1.7–2.4)

## 2018-04-06 NOTE — ED Notes (Signed)
Pt given water to drink. 

## 2018-04-06 NOTE — ED Provider Notes (Signed)
[facility] Emergency Department Provider Note  ____________________________________________  Time seen: Approximately 5:21 PM  I have reviewed the triage vital signs and the nursing notes.   HISTORY  Chief Complaint Chest Pain    HPI Jimmy Carson is a 77 y.o. male with a history of anemia, CAD, end-stage renal disease on hemodialysis, reports that he was in his usual state of health, went to a routine dialysis today, and about halfway through his session he started getting a vague chest discomfort.  He denies it being pain or pressure.  No associated shortness of breath vomiting diaphoresis radiation or dizziness.  Is just a vague discomfort which was 4/10 at its worst, now 2/10.  No aggravating or alleviating factors.    He is compliant with his dialysis diet, usually needs less than 3 L ultrafiltration.  Past Medical History:  Diagnosis Date  . Anemia   . CAD (coronary artery disease)    s/p percutaneous coronary angioplasty, s/p CABG x 6   . ESRD (end stage renal disease) ([place])   . Heart attack ([place])   . History of radiation therapy    for prostate CA  . Hyperlipidemia   . Hyperparathyroidism, secondary renal ([place])   . Hypertension   . Osteoarthritis   . Polycystic kidney disease   . Prostate CA [facility]) 2012   Brachytherapy  . Renal insufficiency    currently on dialysis  . Shingles      Patient Active Problem List   Diagnosis Date Noted  . Pleural effusion on left 08/30/2017  . ESRD on dialysis ([place]) 08/30/2017  . HTN (hypertension) 08/30/2017  . CAD (coronary artery disease) 08/30/2017  . HLD (hyperlipidemia) 08/30/2017  . 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 NSTEMI (non-ST elevated myocardial infarction) ([place]) 07/22/2015  . Chest pain 07/21/2015     Past Surgical History:  Procedure Laterality Date  . AORTIC VALVE REPLACEMENT    . CARDIAC CATHETERIZATION N/A 07/24/2015   Procedure: Coronary/Graft Angiography;   Surgeon: Corey Skains, MD;  Location: [place] [facility];  Service: Cardiovascular;  Laterality: N/A;  . CARDIAC CATHETERIZATION N/A 06/03/2016   Procedure: Left Heart Cath and Coronary Angiography;  Surgeon: Isaias Cowman, MD;  Location: [place] [facility];  Service: Cardiovascular;  Laterality: N/A;  . CORONARY ARTERY BYPASS GRAFT    . JOINT REPLACEMENT    . REPLACEMENT TOTAL KNEE BILATERAL    . ROTATOR CUFF REPAIR Right      Prior to Admission medications   Medication Sig Start Date End Date Taking? Authorizing Provider  acetaminophen (TYLENOL) 325 MG tablet Take 650 mg by mouth every 4 (four) hours as needed for mild pain.     [provider]  b complex-C-folic acid 1 MG capsule Take 1 capsule by mouth daily.    [provider]  cholecalciferol (VITAMIN D) 400 UNITS TABS tablet Take 1,000 Units by mouth.    [provider]  cinacalcet (SENSIPAR) 60 MG tablet Take 60 mg by mouth daily.     [provider]  clopidogrel (PLAVIX) 75 MG tablet Take 75 mg by mouth daily.    [provider]  isosorbide mononitrate (IMDUR) 60 MG 24 hr tablet Take 60 mg by mouth daily.    [provider]  nitroGLYCERIN (NITROSTAT) 0.4 MG SL tablet Place 1 tablet (0.4 mg total) under the tongue every 5 (five) minutes as needed for chest pain. 06/03/16   Max Sane, MD  rosuvastatin (CRESTOR) 10 MG  tablet Take 10 mg by mouth daily.    [provider]  sevelamer carbonate (RENVELA) 800 MG tablet Take 2,400 mg by mouth 3 (three) times daily with meals. How many he takes with his meals depends on what he's eating    [provider]  sevelamer carbonate (RENVELA) 800 MG tablet Take 1,600 mg by mouth as needed (with snacks).    [provider]  tamsulosin (FLOMAX) 0.4 MG CAPS capsule Take 0.4 mg by mouth.    [provider]  traZODone (DESYREL) 50 MG tablet Take 50 mg by mouth at bedtime.    [provider]      Allergies Orange fruit [citrus] and Penicillins   Family History  Problem Relation Age of Onset  . Polycystic kidney disease Father   . Aneurysm Father     Social History Social History   Tobacco Use  . Smoking status: Former Smoker    Last attempt to quit: 04/19/2013    Years since quitting: 4.9  . Smokeless tobacco: Never Used  Substance Use Topics  . Alcohol use: No  . Drug use: No    Review of Systems  Constitutional:   No fever or chills.  ENT:   No sore throat. No rhinorrhea. Cardiovascular: Positive as above for chest discomfort without syncope. Respiratory:   No dyspnea or cough. Gastrointestinal:   Negative for abdominal pain, vomiting and diarrhea.  Musculoskeletal:   Negative for focal pain or swelling All other systems reviewed and are negative except as documented above in ROS and HPI.  ____________________________________________   PHYSICAL EXAM:  VITAL SIGNS: ED Triage Vitals  Enc Vitals Group     BP 04/06/18 1455 (!) 142/80     Pulse Rate 04/06/18 1455 70     Resp 04/06/18 1455 15     Temp 04/06/18 1455 98.4 F (36.9 C)     Temp Source 04/06/18 1455 Oral     SpO2 04/06/18 1453 97 %     Weight 04/06/18 1456 170 lb (77.1 kg)     Height 04/06/18 1456 5\' 10"  (1.778 m)     Head Circumference --      Peak Flow --      Pain Score 04/06/18 1456 4     Pain Loc --      Pain Edu? --      Excl. in [employer]? --     Vital signs reviewed, nursing assessments reviewed.   Constitutional:   Alert and oriented. Non-toxic appearance. Eyes:   Conjunctivae are normal. EOMI. PERRL. ENT      Head:   Normocephalic and atraumatic.      Nose:   No congestion/rhinnorhea.       Mouth/Throat:   MMM, no pharyngeal erythema. No peritonsillar mass.       Neck:   No meningismus. Full ROM. Hematological/Lymphatic/Immunilogical:   No cervical lymphadenopathy. Cardiovascular:   RRR. Symmetric bilateral DP pulses.  Diminished left radial pulse.  Good thrill in the  left upper arm AV fistula.  Loud AS murmur.. Cap refill less than 2 seconds. Respiratory:   Normal respiratory effort without tachypnea/retractions. Breath sounds are clear and equal bilaterally. No wheezes/rales/rhonchi. Gastrointestinal:   Soft and nontender. Non distended. There is no CVA tenderness.  No rebound, rigidity, or guarding.  Musculoskeletal:   Normal range of motion in all extremities. No joint effusions.  No lower extremity tenderness.  No edema. Neurologic:   Normal speech and language.  Motor grossly intact. No acute focal  neurologic deficits are appreciated.  Skin:    Skin is warm, dry and intact. No rash noted.  No petechiae, purpura, or bullae.  ____________________________________________    LABS (pertinent positives/negatives) (all labs ordered are listed, but only abnormal results are displayed) Labs Reviewed  BASIC METABOLIC PANEL - Abnormal; Notable for the following components:      Result Value   Potassium 3.2 (*)    Chloride 93 (*)    Glucose, Bld 102 (*)    Creatinine, Ser 4.13 (*)    Calcium 7.9 (*)    GFR calc non Af Amer 13 (*)    GFR calc Af Amer 15 (*)    All other components within normal limits  CBC - Abnormal; Notable for the following components:   RBC 3.77 (*)    Hemoglobin 10.1 (*)    HCT 33.0 (*)    RDW 18.6 (*)    All other components within normal limits  TROPONIN I  MAGNESIUM  TROPONIN I   ____________________________________________   EKG  Interpreted by me Sinus rhythm rate of 68, right axis, prolonged QTC of 5 3 4  ms, first-degree AV block.  Right left bundle branch block.  No acute ischemic changes.  ____________________________________________    RADIOLOGY  Dg Chest 2 View  Result Date: 04/06/2018 CLINICAL DATA:  Chest discomfort which began during dialysis treatment. No shortness of breath or radiation of pain. Former smoker. History of coronary artery disease, CABG, and aortic valve replacement. EXAM: CHEST - 2  VIEW COMPARISON:  Chest x-ray of August 30, 2017 FINDINGS: The lungs are adequately inflated. The interstitial markings are increased. There is a small left pleural effusion which is not new. The cardiac silhouette is enlarged. The pulmonary vascularity is engorged. There is calcification in the wall of the aortic arch. There is a prosthetic aortic valve cage. The sternal wires are intact. There is multilevel degenerative disc disease of the thoracic spine. IMPRESSION: Mild interstitial edema bilaterally likely secondary to CHF. No alveolar pneumonia. Chronic small left pleural effusion. Thoracic aortic atherosclerosis. Electronically Signed   By: David  Martinique M.D.   On: 04/06/2018 15:35    ____________________________________________   PROCEDURES Procedures  ____________________________________________   CLINICAL IMPRESSION / ASSESSMENT AND PLAN / ED COURSE  Pertinent labs & imaging results that were available during my care of the patient were reviewed by me and considered in my medical decision making (see chart for details).    Patient presents with a vague atypical chest discomfort, noncardiac.  In the last 2 to 3 months he has had an echocardiogram and nuclear medicine cardiac study performed at [facility] both of which were stable compared to previous with his underlying aortic stenosis and history of CAD and heart attack.  Considering the patient's symptoms, medical history, and physical examination today, I have low suspicion for ACS, PE, TAD, pneumothorax, carditis, mediastinitis, pneumonia, CHF, or sepsis.  Suspect gas pain or GERD is the most likely cause.  Pain is improving on its own after short period of time.  I will observe him in the ED for telemetry monitoring while checking labs and serial troponin.  If negative I think patient is stable for discharge home.  He has recently had reassuring cardiac studies.   ----------------------------------------- 7:58 PM on  04/06/2018 -----------------------------------------  Serial troponins are negative.  Tolerating oral intake.  Symptoms resolved.  Stable for discharge home to follow-up with cardiology this week.     ____________________________________________   FINAL CLINICAL IMPRESSION(S) / ED DIAGNOSES  Final diagnoses:  Atypical chest pain     ED Discharge Orders    None      Portions of this note were generated with dragon dictation software. Dictation errors may occur despite best attempts at proofreading.    Carrie Mew, MD 04/06/18 1958

## 2018-04-06 NOTE — Discharge Instructions (Addendum)
Your lab test today were all okay.  Please continue taking all your medicines and follow-up with your cardiologist this week for continued monitoring of your symptoms.

## 2018-04-06 NOTE — ED Triage Notes (Signed)
Pt arrived via ems for report of chest discomfort from dialysis approx 1 hour from completion of session - denies shortness of breath, dizziness, radiation of pain, or N/V

## 2018-04-10 ENCOUNTER — Other Ambulatory Visit
Admission: RE | Admit: 2018-04-10 | Discharge: 2018-04-10 | Disposition: A | Discharge status: Home / Self Care | Payer: Medicare Other | Source: Ambulatory Visit | Attending: Orthopedic Surgery | Admitting: Orthopedic Surgery

## 2018-04-10 DIAGNOSIS — M25461 Effusion, right knee: Secondary | ICD-10-CM | POA: Insufficient documentation

## 2018-04-10 LAB — SYNOVIAL FLUID, CRYSTAL: Crystals, Fluid: NONE SEEN

## 2018-04-26 NOTE — Discharge Instructions (Signed)
°  Instructions after Total Knee Replacement ° ° [name], [name], M.D.    ° Dept. of Orthopaedics & Sports Medicine ° [facility] ° [address] ° [place], [place]  [address] ° Phone: [phone]   Fax: [phone] ° °  °DIET: °• Drink plenty of non-alcoholic fluids. °• Resume your normal diet. Include foods high in fiber. ° °ACTIVITY:  °• You may use crutches or a walker with weight-bearing as tolerated, unless instructed otherwise. °• You may be weaned off of the walker or crutches by your Physical Therapist.  °• Do NOT place pillows under the knee. Anything placed under the knee could limit your ability to straighten the knee.   °• Continue doing gentle exercises. Exercising will reduce the pain and swelling, increase motion, and prevent muscle weakness.   °• Please continue to use the TED compression stockings for 6 weeks. You may remove the stockings at night, but should reapply them in the morning. °• Do not drive or operate any equipment until instructed. ° °WOUND CARE:  °• Continue to use the PolarCare or ice packs periodically to reduce pain and swelling. °• You may bathe or shower after the staples are removed at the first office visit following surgery. ° °MEDICATIONS: °• You may resume your regular medications. °• Please take the pain medication as prescribed on the medication. °• Do not take pain medication on an empty stomach. °• You have been given a prescription for a blood thinner (Lovenox or Coumadin). Please take the medication as instructed. (NOTE: After completing a 2 week course of Lovenox, take one Enteric-coated aspirin once a day. This along with elevation will help reduce the possibility of phlebitis in your operated leg.) °• Do not drive or drink alcoholic beverages when taking pain medications. ° °CALL THE OFFICE FOR: °• Temperature above 101 degrees °• Excessive bleeding or drainage on the dressing. °• Excessive swelling, coldness, or paleness of the toes. °• Persistent  nausea and vomiting. ° °FOLLOW-UP:  °• You should have an appointment to return to the office in 10-14 days after surgery. °• Arrangements have been made for continuation of Physical Therapy (either home therapy or outpatient therapy). °  °

## 2018-04-30 ENCOUNTER — Inpatient Hospital Stay: Admission: RE | Admit: 2018-04-30 | Payer: Medicare Other | Source: Ambulatory Visit

## 2018-05-07 ENCOUNTER — Other Ambulatory Visit: Payer: Self-pay

## 2018-05-07 ENCOUNTER — Encounter
Admission: RE | Admit: 2018-05-07 | Discharge: 2018-05-07 | Disposition: A | Discharge status: Home / Self Care | Payer: Medicare Other | Source: Ambulatory Visit | Attending: Orthopedic Surgery | Admitting: Orthopedic Surgery

## 2018-05-07 DIAGNOSIS — Z01812 Encounter for preprocedural laboratory examination: Secondary | ICD-10-CM | POA: Diagnosis present

## 2018-05-07 LAB — COMPREHENSIVE METABOLIC PANEL
ALT: 13 U/L (ref 0–44)
AST: 21 U/L (ref 15–41)
Albumin: 3.2 g/dL — ABNORMAL LOW (ref 3.5–5.0)
Alkaline Phosphatase: 103 U/L (ref 38–126)
Anion gap: 14 (ref 5–15)
BILIRUBIN TOTAL: 0.8 mg/dL (ref 0.3–1.2)
BUN: 26 mg/dL — AB (ref 8–23)
CHLORIDE: 92 mmol/L — AB (ref 98–111)
CO2: 34 mmol/L — ABNORMAL HIGH (ref 22–32)
CREATININE: 4.48 mg/dL — AB (ref 0.61–1.24)
Calcium: 8.5 mg/dL — ABNORMAL LOW (ref 8.9–10.3)
GFR, EST AFRICAN AMERICAN: 14 mL/min — AB (ref >60)
GFR, EST NON AFRICAN AMERICAN: 12 mL/min — AB (ref >60)
Glucose, Bld: 114 mg/dL — ABNORMAL HIGH (ref 70–99)
POTASSIUM: 4.1 mmol/L (ref 3.5–5.1)
Sodium: 140 mmol/L (ref 135–145)
TOTAL PROTEIN: 7.2 g/dL (ref 6.5–8.1)

## 2018-05-07 LAB — CBC
HEMATOCRIT: 31.5 % — AB (ref 39.0–52.0)
Hemoglobin: 9.2 g/dL — ABNORMAL LOW (ref 13.0–17.0)
MCH: 26.3 pg (ref 26.0–34.0)
MCHC: 29.2 g/dL — ABNORMAL LOW (ref 30.0–36.0)
MCV: 90 fL (ref 80.0–100.0)
Platelets: 211 10*3/uL (ref 150–400)
RBC: 3.5 MIL/uL — ABNORMAL LOW (ref 4.22–5.81)
RDW: 17.6 % — AB (ref 11.5–15.5)
WBC: 5.8 10*3/uL (ref 4.0–10.5)
nRBC: 0 % (ref 0.0–0.2)

## 2018-05-07 LAB — TYPE AND SCREEN
ABO/RH(D): O POS
Antibody Screen: NEGATIVE

## 2018-05-07 LAB — PROTIME-INR
INR: 1.14
PROTHROMBIN TIME: 14.5 s (ref 11.4–15.2)

## 2018-05-07 LAB — SEDIMENTATION RATE: SED RATE: 79 mm/h — AB (ref 0–20)

## 2018-05-07 LAB — SURGICAL PCR SCREEN
MRSA, PCR: NEGATIVE
STAPHYLOCOCCUS AUREUS: NEGATIVE

## 2018-05-07 LAB — C-REACTIVE PROTEIN: CRP: 8.7 mg/dL — ABNORMAL HIGH (ref <1.0)

## 2018-05-07 LAB — APTT: aPTT: 46 seconds — ABNORMAL HIGH (ref 24–36)

## 2018-05-07 NOTE — Patient Instructions (Signed)
  Your procedure is scheduled on: Friday May 15, 2018 Report to Same Day Surgery 2nd floor [facility] Entrance-take elevator on left to 2nd floor.  Check in with surgery information desk.) To find out your arrival time, call [phone] 1:00-3:00 PM on Thursday May 14, 2018  Remember: Instructions that are not followed completely may result in serious medical risk, up to and including death, or upon the discretion of your surgeon and anesthesiologist your surgery may need to be rescheduled.    __x__ 1. Do not eat food (including mints, candies, chewing gum) after midnight the night before your procedure. You may drink water up to 2 hours before you are scheduled to arrive at the hospital for your procedure.  Do not drink anything within 2 hours of your scheduled arrival to the hospital.    __x__ 2. No Alcohol for 24 hours before or after surgery.   __x__ 3. No Smoking or e-cigarettes for 24 hours before surgery.  Do not use any chewable tobacco products for at least 6 hours before surgery.   __x__ 4. Notify your doctor if there is any change in your medical condition (cold, fever, infections).   __x__ 5. On the morning of surgery brush your teeth with toothpaste and water.  You may rinse your mouth with mouthwash if you wish.  Do not swallow any toothpaste or mouthwash.  Please read over the following fact sheets that you were given:   [facility] Preparing for Surgery and/or MRSA Information    __x__ Use CHG Soap or Sage wipes as directed on instruction sheet   Do not wear jewelry on the day of surgery.  Do not wear lotions, powders, deodorant, or perfumes.   Do not shave below the face/neck 48 hours prior to surgery.   Do not bring valuables to the hospital.    [facility] is not responsible for any belongings or valuables.               Contacts, dentures or bridgework may not be worn into surgery.  Leave your suitcase in the car. After surgery it may be  brought to your room.  For patients admitted to the hospital, discharge time is determined by your treatment team.  __x__ On the morning of surgery, take with a SMALL SIP OF WATER:  1. Tylenol if needed  __x__ Follow recommendations from Cardiologist, Pulmonologist or PCP regarding stopping Plavix, or other blood thinners such as Aspirin, Coumadin, Plavix, Eliquis, Effient, Pradaxa, and Pletal.  __x__ TODAY: Stop Anti-inflammatories such as Naproxen (Aleve), Advil, Ibuprofen, Motrin, Naprosyn, BC/Goodies powders or aspirin products. You may continue to take Tylenol and Celebrex.   __x__ TODAY: Stop supplements until after surgery. You may continue to take Vitamin D, Vitamin B, and multivitamin.

## 2018-05-07 NOTE — Pre-Procedure Instructions (Signed)
RESULTED LABS FAXED TO DR Marry Guan

## 2018-05-07 NOTE — Pre-Procedure Instructions (Signed)
FAXED [place]. ALSO MESSAGED HIM

## 2018-05-08 ENCOUNTER — Encounter: Admission: RE | Admit: 2018-05-08 | Payer: Medicare Other | Source: Ambulatory Visit

## 2018-05-08 NOTE — Pre-Procedure Instructions (Signed)
CRP results sent to Dr. Marry Guan for review.

## 2018-05-09 LAB — IGE: IgE (Immunoglobulin E), Serum: 2 IU/mL — ABNORMAL LOW (ref 6–495)

## 2018-05-11 ENCOUNTER — Encounter
Admission: RE | Admit: 2018-05-11 | Discharge: 2018-05-11 | Disposition: A | Discharge status: Home / Self Care | Payer: Medicare Other | Source: Ambulatory Visit | Attending: Orthopedic Surgery | Admitting: Orthopedic Surgery

## 2018-05-11 DIAGNOSIS — Z01812 Encounter for preprocedural laboratory examination: Secondary | ICD-10-CM | POA: Diagnosis not present

## 2018-05-11 LAB — URINALYSIS, ROUTINE W REFLEX MICROSCOPIC
BILIRUBIN URINE: NEGATIVE
Bacteria, UA: NONE SEEN
GLUCOSE, UA: 50 mg/dL — AB
KETONES UR: NEGATIVE mg/dL
LEUKOCYTES UA: NEGATIVE
NITRITE: NEGATIVE
PH: 9 — AB (ref 5.0–8.0)
PROTEIN: 100 mg/dL — AB
Specific Gravity, Urine: 1.009 (ref 1.005–1.030)

## 2018-05-11 NOTE — Pre-Procedure Instructions (Signed)
UA FAXED TO DR Marry Guan

## 2018-05-13 LAB — URINE CULTURE

## 2018-05-13 NOTE — Pre-Procedure Instructions (Addendum)
CLEARED BY DR PARASCHOS MEDIUM RISK. RECOMMENDS BLOCK VS GENERAL IF POSSIBLE. FAXED UC TO DR Marry Guan

## 2018-05-14 MED ORDER — TRANEXAMIC ACID-NACL 1000-0.7 MG/100ML-% IV SOLN
1000.0000 mg | INTRAVENOUS | Status: AC
  Filled 2018-05-14: qty 100

## 2018-05-14 MED ORDER — CLINDAMYCIN PHOSPHATE 900 MG/50ML IV SOLN: 900.0000 mg | INTRAVENOUS | Status: AC

## 2018-05-15 ENCOUNTER — Encounter: Payer: Self-pay | Admitting: Orthopedic Surgery

## 2018-05-15 ENCOUNTER — Encounter
Admission: RE | Disposition: A | Discharge status: Home / Self Care | Payer: Self-pay | Source: Ambulatory Visit | Attending: Orthopedic Surgery

## 2018-05-15 ENCOUNTER — Inpatient Hospital Stay: Payer: Medicare Other

## 2018-05-15 ENCOUNTER — Ambulatory Visit
Admission: RE | Admit: 2018-05-15 | Discharge: 2018-05-15 | Disposition: A | Discharge status: Home / Self Care | Payer: Medicare Other | Source: Ambulatory Visit | Attending: Orthopedic Surgery | Admitting: Orthopedic Surgery

## 2018-05-15 ENCOUNTER — Other Ambulatory Visit: Payer: Self-pay

## 2018-05-15 DIAGNOSIS — M89561 Osteolysis, right lower leg: Secondary | ICD-10-CM | POA: Insufficient documentation

## 2018-05-15 DIAGNOSIS — E7800 Pure hypercholesterolemia, unspecified: Secondary | ICD-10-CM | POA: Insufficient documentation

## 2018-05-15 DIAGNOSIS — G4700 Insomnia, unspecified: Secondary | ICD-10-CM | POA: Insufficient documentation

## 2018-05-15 DIAGNOSIS — I2510 Atherosclerotic heart disease of native coronary artery without angina pectoris: Secondary | ICD-10-CM | POA: Insufficient documentation

## 2018-05-15 DIAGNOSIS — Z992 Dependence on renal dialysis: Secondary | ICD-10-CM

## 2018-05-15 DIAGNOSIS — N186 End stage renal disease: Secondary | ICD-10-CM | POA: Insufficient documentation

## 2018-05-15 DIAGNOSIS — Z9861 Coronary angioplasty status: Secondary | ICD-10-CM

## 2018-05-15 LAB — POCT I-STAT 4, (NA,K, GLUC, HGB,HCT)
Glucose, Bld: 78 mg/dL (ref 70–99)
HCT: 28 % — ABNORMAL LOW (ref 39.0–52.0)
HEMOGLOBIN: 9.5 g/dL — AB (ref 13.0–17.0)
Potassium: 4.1 mmol/L (ref 3.5–5.1)
Sodium: 136 mmol/L (ref 135–145)

## 2018-05-15 LAB — ABO/RH: ABO/RH(D): O POS

## 2018-05-15 SURGERY — TOTAL KNEE REVISION
Anesthesia: General | Laterality: Right

## 2018-05-15 MED ORDER — SODIUM CHLORIDE FLUSH 0.9 % IV SOLN
INTRAVENOUS | Status: AC
  Filled 2018-05-15: qty 40

## 2018-05-15 MED ORDER — CHLORHEXIDINE GLUCONATE 4 % EX LIQD
60.0000 mL | Freq: Once | CUTANEOUS | Status: AC
  Administered 2018-05-15: 4 via TOPICAL

## 2018-05-15 MED ORDER — CLINDAMYCIN PHOSPHATE 900 MG/50ML IV SOLN
INTRAVENOUS | Status: AC
  Filled 2018-05-15: qty 50

## 2018-05-15 MED ORDER — CELECOXIB 200 MG PO CAPS
400.0000 mg | ORAL_CAPSULE | Freq: Once | ORAL | Status: AC
  Administered 2018-05-15: 400 mg via ORAL

## 2018-05-15 MED ORDER — TETRACAINE HCL 1 % IJ SOLN
INTRAMUSCULAR | Status: AC
  Filled 2018-05-15: qty 2

## 2018-05-15 MED ORDER — NEOMYCIN-POLYMYXIN B GU 40-200000 IR SOLN
Status: AC
  Filled 2018-05-15: qty 20

## 2018-05-15 MED ORDER — GABAPENTIN 300 MG PO CAPS
ORAL_CAPSULE | ORAL | Status: AC
  Filled 2018-05-15: qty 1

## 2018-05-15 MED ORDER — GABAPENTIN 300 MG PO CAPS
300.0000 mg | ORAL_CAPSULE | Freq: Once | ORAL | Status: AC
  Administered 2018-05-15: 300 mg via ORAL

## 2018-05-15 MED ORDER — FAMOTIDINE 20 MG PO TABS
ORAL_TABLET | ORAL | Status: AC
  Filled 2018-05-15: qty 1

## 2018-05-15 MED ORDER — ACETAMINOPHEN 10 MG/ML IV SOLN
INTRAVENOUS | Status: AC
  Filled 2018-05-15: qty 100

## 2018-05-15 MED ORDER — BUPIVACAINE LIPOSOME 1.3 % IJ SUSP
INTRAMUSCULAR | Status: AC
  Filled 2018-05-15: qty 20

## 2018-05-15 MED ORDER — FAMOTIDINE 20 MG PO TABS
20.0000 mg | ORAL_TABLET | Freq: Once | ORAL | Status: AC
  Administered 2018-05-15: 20 mg via ORAL

## 2018-05-15 MED ORDER — CELECOXIB 200 MG PO CAPS
ORAL_CAPSULE | ORAL | Status: AC
  Filled 2018-05-15: qty 2

## 2018-05-15 MED ORDER — DEXAMETHASONE SODIUM PHOSPHATE 10 MG/ML IJ SOLN
INTRAMUSCULAR | Status: AC
  Filled 2018-05-15: qty 1

## 2018-05-15 MED ORDER — DEXMEDETOMIDINE HCL IN NACL 80 MCG/20ML IV SOLN
INTRAVENOUS | Status: AC
  Filled 2018-05-15: qty 20

## 2018-05-15 MED ORDER — BUPIVACAINE HCL (PF) 0.25 % IJ SOLN
INTRAMUSCULAR | Status: AC
  Filled 2018-05-15: qty 60

## 2018-05-15 MED ORDER — SODIUM CHLORIDE 0.9 % IV SOLN
INTRAVENOUS | Status: DC
  Administered 2018-05-15: 07:00:00 via INTRAVENOUS

## 2018-05-15 MED ORDER — PROPOFOL 500 MG/50ML IV EMUL
INTRAVENOUS | Status: AC
  Filled 2018-05-15: qty 50

## 2018-05-15 MED ORDER — LIDOCAINE HCL (PF) 1 % IJ SOLN
INTRAMUSCULAR | Status: AC
  Filled 2018-05-15: qty 30

## 2018-05-15 MED ORDER — MIDAZOLAM HCL 2 MG/2ML IJ SOLN
INTRAMUSCULAR | Status: AC
  Filled 2018-05-15: qty 2

## 2018-05-15 MED ORDER — DEXAMETHASONE SODIUM PHOSPHATE 10 MG/ML IJ SOLN
8.0000 mg | Freq: Once | INTRAMUSCULAR | Status: AC
  Administered 2018-05-15: 8 mg via INTRAVENOUS

## 2018-05-15 MED ORDER — FENTANYL CITRATE (PF) 100 MCG/2ML IJ SOLN
INTRAMUSCULAR | Status: AC
  Filled 2018-05-15: qty 2

## 2018-05-15 MED ORDER — SEVOFLURANE IN SOLN
RESPIRATORY_TRACT | Status: AC
  Filled 2018-05-15: qty 250

## 2018-05-15 SURGICAL SUPPLY — 70 items
BLADE OSCILLATING/SAGITTAL (BLADE)
BLADE SAW 1 (BLADE) IMPLANT
BLADE SAW 1/2 (BLADE) IMPLANT
BLADE SW THK.38XMED LNG THN (BLADE) IMPLANT
CANISTER SUCT 1200ML W/VALVE (MISCELLANEOUS) IMPLANT
CANISTER SUCT 3000ML PPV (MISCELLANEOUS) IMPLANT
CNTNR SPEC 2.5X3XGRAD LEK (MISCELLANEOUS)
CONT SPEC 4OZ STER OR WHT (MISCELLANEOUS)
CONTAINER SPEC 2.5X3XGRAD LEK (MISCELLANEOUS) IMPLANT
COOLER POLAR GLACIER W/PUMP (MISCELLANEOUS) IMPLANT
COVER WAND RF STERILE (DRAPES) IMPLANT
CUFF TOURN 24 STER (MISCELLANEOUS) IMPLANT
CUFF TOURN 30 STER DUAL PORT (MISCELLANEOUS) IMPLANT
DRAPE SHEET LG 3/4 BI-LAMINATE (DRAPES) IMPLANT
DRAPE TABLE BACK 80X90 (DRAPES) IMPLANT
DRSG DERMACEA 8X12 NADH (GAUZE/BANDAGES/DRESSINGS) IMPLANT
DRSG OPSITE POSTOP 4X14 (GAUZE/BANDAGES/DRESSINGS) IMPLANT
DURAPREP 26ML APPLICATOR (WOUND CARE) IMPLANT
ELECT CAUTERY BLADE 6.4 (BLADE) IMPLANT
ELECT REM PT RETURN 9FT ADLT (ELECTROSURGICAL)
ELECTRODE REM PT RTRN 9FT ADLT (ELECTROSURGICAL) IMPLANT
GAUZE SPONGE 4X4 12PLY STRL (GAUZE/BANDAGES/DRESSINGS) IMPLANT
GLOVE BIOGEL M STRL SZ7.5 (GLOVE) IMPLANT
GLOVE BIOGEL PI IND STRL 9 (GLOVE) IMPLANT
GLOVE BIOGEL PI INDICATOR 9 (GLOVE)
GLOVE INDICATOR 8.0 STRL GRN (GLOVE) IMPLANT
GLOVE SURG SYN 9.0  PF PI (GLOVE)
GLOVE SURG SYN 9.0 PF PI (GLOVE) IMPLANT
GOWN STRL REUS W/ TWL LRG LVL3 (GOWN DISPOSABLE) IMPLANT
GOWN STRL REUS W/TWL 2XL LVL3 (GOWN DISPOSABLE) IMPLANT
GOWN STRL REUS W/TWL LRG LVL3 (GOWN DISPOSABLE)
HANDPIECE VERSAJET DEBRIDEMENT (MISCELLANEOUS) IMPLANT
HEMOVAC 400CC 10FR (MISCELLANEOUS) IMPLANT
HOOD PEEL AWAY FLYTE STAYCOOL (MISCELLANEOUS) IMPLANT
IRRIGATION STRYKERFLOW (MISCELLANEOUS) IMPLANT
IRRIGATOR STRYKERFLOW (MISCELLANEOUS)
KIT TURNOVER KIT A (KITS) IMPLANT
KNIFE SCULPS 14X20 (INSTRUMENTS) IMPLANT
NDL SAFETY ECLIPSE 18X1.5 (NEEDLE) IMPLANT
NEEDLE HYPO 18GX1.5 SHARP (NEEDLE)
NEEDLE SPNL 18GX3.5 QUINCKE PK (NEEDLE) IMPLANT
NEEDLE SPNL 20GX3.5 QUINCKE YW (NEEDLE) IMPLANT
NS IRRIG 1000ML POUR BTL (IV SOLUTION) IMPLANT
PACK TOTAL KNEE (MISCELLANEOUS) IMPLANT
PAD ABD DERMACEA PRESS 5X9 (GAUZE/BANDAGES/DRESSINGS) IMPLANT
PAD WRAPON POLAR KNEE (MISCELLANEOUS) IMPLANT
PENCIL SMOKE ULTRAEVAC 22 CON (MISCELLANEOUS) IMPLANT
PULSAVAC PLUS IRRIG FAN TIP (DISPOSABLE)
SOL .9 NS 3000ML IRR  AL (IV SOLUTION)
SOL .9 NS 3000ML IRR UROMATIC (IV SOLUTION) IMPLANT
SPONGE LAP 18X18 RF (DISPOSABLE) IMPLANT
STAPLER SKIN PROX 35W (STAPLE) IMPLANT
SUCTION FRAZIER HANDLE 10FR (MISCELLANEOUS)
SUCTION TUBE FRAZIER 10FR DISP (MISCELLANEOUS) IMPLANT
SUT MNCRL 0 1X36 CT-1 (SUTURE) IMPLANT
SUT MON AB 2-0 CT1 36 (SUTURE) IMPLANT
SUT MONOCRYL 0 (SUTURE)
SUT PROLENE 1 CT 1 30 (SUTURE) IMPLANT
SUT VIC AB 0 CT1 36 (SUTURE) IMPLANT
SUT VIC AB 1 CT1 36 (SUTURE) IMPLANT
SUT VIC AB 2-0 CT1 (SUTURE) IMPLANT
SWAB CULTURE AMIES ANAERIB BLU (MISCELLANEOUS) IMPLANT
SYR 20CC LL (SYRINGE) IMPLANT
SYR 30ML LL (SYRINGE) IMPLANT
TIP COAXIAL FEMORAL CANAL (MISCELLANEOUS) IMPLANT
TIP FAN IRRIG PULSAVAC PLUS (DISPOSABLE) IMPLANT
TOWEL OR 17X26 4PK STRL BLUE (TOWEL DISPOSABLE) IMPLANT
TOWER CARTRIDGE SMART MIX (DISPOSABLE) IMPLANT
TRAY FOLEY MTR SLVR 16FR STAT (SET/KITS/TRAYS/PACK) IMPLANT
WRAPON POLAR PAD KNEE (MISCELLANEOUS)

## 2018-05-15 NOTE — Transfer of Care (Signed)
Immediate Anesthesia Transfer of Care Note  Patient: Jimmy Carson  Procedure(s) Performed: TOTAL KNEE REVISION-Case Cancel  (Right )  Patient Location: Short Stay  Anesthesia Type:none  Level of Consciousness: awake, alert  and oriented  Airway & Oxygen Therapy: Patient Spontanous Breathing  Post-op Assessment: Report given to RN and Post -op Vital signs reviewed and stable  Post vital signs: Reviewed  Last Vitals:  Vitals Value Taken Time  BP    Temp    Pulse    Resp    SpO2      Last Pain:  Vitals:   05/15/18 0617  TempSrc: Temporal  PainSc: 3          Complications: No apparent anesthesia complications

## 2018-05-15 NOTE — Progress Notes (Signed)
   05/15/18 0800  Clinical Encounter Type  Visited With Patient  Visit Type Initial  Spiritual Encounters  Spiritual Needs Emotional;Other (Comment) (Check-in after canceled surgery.)   Chaplain engaged patient to see if the canceled surgery generated an emotional response. Patient understood the situation and wanted to be safe. Chaplain offered presence and emotional support.

## 2018-05-15 NOTE — H&P (Signed)
The patient has been re-examined, and the chart reviewed, and there have been no interval changes to the documented history and physical.    The risks, benefits, and alternatives have been discussed at length. The patient expressed understanding of the risks benefits and agreed with plans for surgical intervention.   P. , [name] M.D.    

## 2018-05-15 NOTE — Anesthesia Post-op Follow-up Note (Signed)
Anesthesia QCDR form completed.        

## 2018-05-15 NOTE — Anesthesia Preprocedure Evaluation (Addendum)
Anesthesia Evaluation  Patient identified by MRN, date of birth, ID band Patient awake    Reviewed: Allergy & Precautions, H&P , NPO status , Patient's Chart, lab work & pertinent test results  Airway Mallampati: III       Dental   Pulmonary COPD, former smoker,    breath sounds clear to auscultation       Cardiovascular hypertension, + CAD, + Past MI, + Cardiac Stents, + CABG and +CHF  + Valvular Problems/Murmurs (mod AS, severe MR, mod TR) AS and MR  Rhythm:regular + Systolic murmurs Echo 1287:  - Left ventricle: The cavity size was mildly dilated. There was   moderate concentric hypertrophy. Systolic function was moderately   reduced. The estimated ejection fraction was in the range of 35%   to 40%. - Aortic valve: There was mild to moderate stenosis. Valve area   (VTI): 1.41 cm^2. Valve area (Vmax): 1.36 cm^2. Valve area   (Vmean): 1.37 cm^2. - Mitral valve: There was severe regurgitation. - Left atrium: The atrium was mildly dilated. - Right atrium: The atrium was mildly dilated. - Tricuspid valve: There was moderate regurgitation.   Neuro/Psych negative neurological ROS  negative psych ROS   GI/Hepatic negative GI ROS, Neg liver ROS, Cystic liver disease    Endo/Other  Hyperthyroidism   Renal/GU ESRFRenal disease     Musculoskeletal  (+) Arthritis ,   Abdominal   Peds  Hematology  (+) Blood dyscrasia, anemia ,   Anesthesia Other Findings Past Medical History: No date: Anemia No date: CAD (coronary artery disease)     Comment:  s/p percutaneous coronary angioplasty, s/p CABG x 6  No date: ESRD (end stage renal disease) ([facility]) No date: Heart attack ([place]) No date: History of radiation therapy     Comment:  for prostate CA No date: Hyperlipidemia No date: Hyperparathyroidism, secondary renal ([place]) No date: Hypertension No date: Osteoarthritis No date: Polycystic kidney disease 2012: Prostate CA ([place])     Comment:  Brachytherapy No date: Renal insufficiency     Comment:  currently on dialysis No date: Shingles  Past Surgical History: No date: AORTIC VALVE REPLACEMENT 07/24/2015: CARDIAC CATHETERIZATION; N/A     Comment:  Procedure: Coronary/Graft Angiography;  Surgeon: Corey Skains, MD;  Location: [place] [facility];  Service:               Cardiovascular;  Laterality: N/A; 06/03/2016: CARDIAC CATHETERIZATION; N/A     Comment:  Procedure: Left Heart Cath and Coronary Angiography;                Surgeon: Isaias Cowman, MD;  Location: [place] [facility];  Service: Cardiovascular;  Laterality:               N/A; No date: CORONARY ARTERY BYPASS GRAFT No date: HERNIA REPAIR No date: JOINT REPLACEMENT No date: REPLACEMENT TOTAL KNEE BILATERAL No date: ROTATOR CUFF REPAIR; Right  BMI    Body Mass Index:  24.61 kg/m      Reproductive/Obstetrics negative OB ROS                          Anesthesia Physical Anesthesia Plan  ASA: IV  Anesthesia Plan: General ETT   Post-op Pain Management:    Induction:   PONV Risk Score and Plan:  Ondansetron, Dexamethasone and Treatment may vary due to age or medical condition  Airway Management Planned:   Additional Equipment:   Intra-op Plan:   Post-operative Plan:   Informed Consent: I have reviewed the patients History and Physical, chart, labs and discussed the procedure including the risks, benefits and alternatives for the proposed anesthesia with the patient or authorized representative who has indicated his/her understanding and acceptance.   Dental Advisory Given  Plan Discussed with: Anesthesiologist  Anesthesia Plan Comments: (This case was postponed after long discussion with surgeon.  Pt had only been off Plavix x 4 days and was not eligible for a spinal.)       Anesthesia Quick Evaluation

## 2018-05-21 MED ORDER — CLINDAMYCIN PHOSPHATE 900 MG/50ML IV SOLN
900.0000 mg | INTRAVENOUS | Status: AC
  Administered 2018-05-22: 900 mg via INTRAVENOUS

## 2018-05-21 MED ORDER — TRANEXAMIC ACID-NACL 1000-0.7 MG/100ML-% IV SOLN
1000.0000 mg | INTRAVENOUS | Status: AC
  Administered 2018-05-22: 1000 mg via INTRAVENOUS
  Filled 2018-05-21: qty 100

## 2018-05-21 NOTE — Discharge Instructions (Signed)
°  Instructions after Total Knee Replacement ° ° Bluma Buresh P. Holmes Hays, [name], M.D.    ° Dept. of Orthopaedics & Sports Medicine ° [facility] ° [address] ° , [place]  [address] ° Phone: [phone]   Fax: [phone] ° °  °DIET: °• Drink plenty of non-alcoholic fluids. °• Resume your normal diet. Include foods high in fiber. ° °ACTIVITY:  °• You may use crutches or a walker with weight-bearing as tolerated, unless instructed otherwise. °• You may be weaned off of the walker or crutches by your Physical Therapist.  °• Do NOT place pillows under the knee. Anything placed under the knee could limit your ability to straighten the knee.   °• Continue doing gentle exercises. Exercising will reduce the pain and swelling, increase motion, and prevent muscle weakness.   °• Please continue to use the TED compression stockings for 6 weeks. You may remove the stockings at night, but should reapply them in the morning. °• Do not drive or operate any equipment until instructed. ° °WOUND CARE:  °• Continue to use the PolarCare or ice packs periodically to reduce pain and swelling. °• You may bathe or shower after the staples are removed at the first office visit following surgery. ° °MEDICATIONS: °• You may resume your regular medications. °• Please take the pain medication as prescribed on the medication. °• Do not take pain medication on an empty stomach. °• You have been given a prescription for a blood thinner (Lovenox or Coumadin). Please take the medication as instructed. (NOTE: After completing a 2 week course of Lovenox, take one Enteric-coated aspirin once a day. This along with elevation will help reduce the possibility of phlebitis in your operated leg.) °• Do not drive or drink alcoholic beverages when taking pain medications. ° °CALL THE OFFICE FOR: °• Temperature above 101 degrees °• Excessive bleeding or drainage on the dressing. °• Excessive swelling, coldness, or paleness of the toes. °• Persistent  nausea and vomiting. ° °FOLLOW-UP:  °• You should have an appointment to return to the office in 10-14 days after surgery. °• Arrangements have been made for continuation of Physical Therapy (either home therapy or outpatient therapy). °  °

## 2018-05-22 ENCOUNTER — Encounter: Payer: Self-pay | Admitting: Orthopedic Surgery

## 2018-05-22 ENCOUNTER — Inpatient Hospital Stay
Admission: RE | Admit: 2018-05-22 | Discharge: 2018-05-26 | DRG: 466 | Disposition: A | Discharge status: Skilled Nursing Facility | Payer: Medicare Other | Source: Ambulatory Visit | Attending: Orthopedic Surgery | Admitting: Orthopedic Surgery

## 2018-05-22 ENCOUNTER — Inpatient Hospital Stay: Payer: Medicare Other | Admitting: Anesthesiology

## 2018-05-22 ENCOUNTER — Encounter
Admission: RE | Disposition: A | Discharge status: Skilled Nursing Facility | Payer: Self-pay | Source: Ambulatory Visit | Attending: Orthopedic Surgery

## 2018-05-22 ENCOUNTER — Other Ambulatory Visit: Payer: Self-pay

## 2018-05-22 ENCOUNTER — Inpatient Hospital Stay: Payer: Medicare Other

## 2018-05-22 DIAGNOSIS — I252 Old myocardial infarction: Secondary | ICD-10-CM

## 2018-05-22 DIAGNOSIS — I2510 Atherosclerotic heart disease of native coronary artery without angina pectoris: Secondary | ICD-10-CM | POA: Diagnosis present

## 2018-05-22 DIAGNOSIS — R05 Cough: Secondary | ICD-10-CM

## 2018-05-22 DIAGNOSIS — Z9861 Coronary angioplasty status: Secondary | ICD-10-CM | POA: Diagnosis not present

## 2018-05-22 DIAGNOSIS — R059 Cough, unspecified: Secondary | ICD-10-CM

## 2018-05-22 DIAGNOSIS — D62 Acute posthemorrhagic anemia: Secondary | ICD-10-CM | POA: Diagnosis not present

## 2018-05-22 DIAGNOSIS — D631 Anemia in chronic kidney disease: Secondary | ICD-10-CM | POA: Diagnosis present

## 2018-05-22 DIAGNOSIS — Z992 Dependence on renal dialysis: Secondary | ICD-10-CM

## 2018-05-22 DIAGNOSIS — M25461 Effusion, right knee: Secondary | ICD-10-CM | POA: Diagnosis present

## 2018-05-22 DIAGNOSIS — M25511 Pain in right shoulder: Secondary | ICD-10-CM | POA: Diagnosis present

## 2018-05-22 DIAGNOSIS — N400 Enlarged prostate without lower urinary tract symptoms: Secondary | ICD-10-CM | POA: Diagnosis present

## 2018-05-22 DIAGNOSIS — Z8546 Personal history of malignant neoplasm of prostate: Secondary | ICD-10-CM | POA: Diagnosis not present

## 2018-05-22 DIAGNOSIS — Q613 Polycystic kidney, unspecified: Secondary | ICD-10-CM

## 2018-05-22 DIAGNOSIS — Z96659 Presence of unspecified artificial knee joint: Secondary | ICD-10-CM | POA: Diagnosis present

## 2018-05-22 DIAGNOSIS — I120 Hypertensive chronic kidney disease with stage 5 chronic kidney disease or end stage renal disease: Secondary | ICD-10-CM | POA: Diagnosis present

## 2018-05-22 DIAGNOSIS — N186 End stage renal disease: Secondary | ICD-10-CM | POA: Diagnosis present

## 2018-05-22 DIAGNOSIS — M1990 Unspecified osteoarthritis, unspecified site: Secondary | ICD-10-CM | POA: Diagnosis present

## 2018-05-22 DIAGNOSIS — E7800 Pure hypercholesterolemia, unspecified: Secondary | ICD-10-CM | POA: Diagnosis present

## 2018-05-22 DIAGNOSIS — E039 Hypothyroidism, unspecified: Secondary | ICD-10-CM | POA: Diagnosis present

## 2018-05-22 DIAGNOSIS — Z951 Presence of aortocoronary bypass graft: Secondary | ICD-10-CM

## 2018-05-22 DIAGNOSIS — N2581 Secondary hyperparathyroidism of renal origin: Secondary | ICD-10-CM | POA: Diagnosis present

## 2018-05-22 DIAGNOSIS — M89561 Osteolysis, right lower leg: Secondary | ICD-10-CM | POA: Diagnosis present

## 2018-05-22 DIAGNOSIS — Z880 Allergy status to penicillin: Secondary | ICD-10-CM | POA: Diagnosis not present

## 2018-05-22 DIAGNOSIS — Z923 Personal history of irradiation: Secondary | ICD-10-CM

## 2018-05-22 DIAGNOSIS — Z91018 Allergy to other foods: Secondary | ICD-10-CM

## 2018-05-22 DIAGNOSIS — M25519 Pain in unspecified shoulder: Secondary | ICD-10-CM

## 2018-05-22 DIAGNOSIS — I959 Hypotension, unspecified: Secondary | ICD-10-CM | POA: Diagnosis not present

## 2018-05-22 HISTORY — PX: TOTAL KNEE REVISION: SHX996

## 2018-05-22 LAB — PROTIME-INR
INR: 1.2
Prothrombin Time: 15.1 seconds (ref 11.4–15.2)

## 2018-05-22 LAB — APTT: APTT: 52 s — AB (ref 24–36)

## 2018-05-22 LAB — COMPREHENSIVE METABOLIC PANEL
ALT: 13 U/L (ref 0–44)
AST: 17 U/L (ref 15–41)
Albumin: 3.1 g/dL — ABNORMAL LOW (ref 3.5–5.0)
Alkaline Phosphatase: 84 U/L (ref 38–126)
Anion gap: 14 (ref 5–15)
BUN: 47 mg/dL — ABNORMAL HIGH (ref 8–23)
CO2: 30 mmol/L (ref 22–32)
Calcium: 8.1 mg/dL — ABNORMAL LOW (ref 8.9–10.3)
Chloride: 90 mmol/L — ABNORMAL LOW (ref 98–111)
Creatinine, Ser: 7.5 mg/dL — ABNORMAL HIGH (ref 0.61–1.24)
GFR calc non Af Amer: 6 mL/min — ABNORMAL LOW (ref >60)
GFR, EST AFRICAN AMERICAN: 7 mL/min — AB (ref >60)
Glucose, Bld: 80 mg/dL (ref 70–99)
Potassium: 4 mmol/L (ref 3.5–5.1)
Sodium: 134 mmol/L — ABNORMAL LOW (ref 135–145)
Total Bilirubin: 0.8 mg/dL (ref 0.3–1.2)
Total Protein: 6.6 g/dL (ref 6.5–8.1)

## 2018-05-22 LAB — CBC
HCT: 30.1 % — ABNORMAL LOW (ref 39.0–52.0)
Hemoglobin: 9 g/dL — ABNORMAL LOW (ref 13.0–17.0)
MCH: 27.3 pg (ref 26.0–34.0)
MCHC: 29.9 g/dL — ABNORMAL LOW (ref 30.0–36.0)
MCV: 91.2 fL (ref 80.0–100.0)
Platelets: 191 10*3/uL (ref 150–400)
RBC: 3.3 MIL/uL — ABNORMAL LOW (ref 4.22–5.81)
RDW: 18.4 % — AB (ref 11.5–15.5)
WBC: 5.8 10*3/uL (ref 4.0–10.5)
nRBC: 0 % (ref 0.0–0.2)

## 2018-05-22 LAB — TYPE AND SCREEN
ABO/RH(D): O POS
Antibody Screen: NEGATIVE

## 2018-05-22 LAB — POCT I-STAT 4, (NA,K, GLUC, HGB,HCT)
Glucose, Bld: 72 mg/dL (ref 70–99)
HCT: 28 % — ABNORMAL LOW (ref 39.0–52.0)
Hemoglobin: 9.5 g/dL — ABNORMAL LOW (ref 13.0–17.0)
Potassium: 3.9 mmol/L (ref 3.5–5.1)
Sodium: 133 mmol/L — ABNORMAL LOW (ref 135–145)

## 2018-05-22 LAB — GLUCOSE, CAPILLARY: GLUCOSE-CAPILLARY: 95 mg/dL (ref 70–99)

## 2018-05-22 SURGERY — TOTAL KNEE REVISION
Anesthesia: Spinal | Site: Knee | Laterality: Right

## 2018-05-22 MED ORDER — FENTANYL CITRATE (PF) 100 MCG/2ML IJ SOLN
25.0000 ug | INTRAMUSCULAR | Status: DC | PRN
  Administered 2018-05-22 (×4): 25 ug via INTRAVENOUS

## 2018-05-22 MED ORDER — TRAMADOL HCL 50 MG PO TABS
50.0000 mg | ORAL_TABLET | ORAL | Status: DC | PRN
  Administered 2018-05-26: 50 mg via ORAL
  Filled 2018-05-22: qty 1

## 2018-05-22 MED ORDER — PROPOFOL 500 MG/50ML IV EMUL
INTRAVENOUS | Status: AC
  Filled 2018-05-22: qty 50

## 2018-05-22 MED ORDER — HYDROMORPHONE HCL 1 MG/ML IJ SOLN: 0.5000 mg | INTRAMUSCULAR | Status: DC | PRN

## 2018-05-22 MED ORDER — PROPOFOL 500 MG/50ML IV EMUL
INTRAVENOUS | Status: DC | PRN
  Administered 2018-05-22: 55 ug/kg/min via INTRAVENOUS

## 2018-05-22 MED ORDER — MIDAZOLAM HCL 2 MG/2ML IJ SOLN
INTRAMUSCULAR | Status: AC
  Filled 2018-05-22: qty 2

## 2018-05-22 MED ORDER — BUPIVACAINE HCL (PF) 0.5 % IJ SOLN
INTRAMUSCULAR | Status: DC | PRN
  Administered 2018-05-22: 2.5 mL

## 2018-05-22 MED ORDER — PROPOFOL 10 MG/ML IV BOLUS
INTRAVENOUS | Status: AC
  Filled 2018-05-22: qty 20

## 2018-05-22 MED ORDER — SENNOSIDES-DOCUSATE SODIUM 8.6-50 MG PO TABS
1.0000 | ORAL_TABLET | Freq: Two times a day (BID) | ORAL | Status: DC
  Administered 2018-05-22 – 2018-05-26 (×7): 1 via ORAL
  Filled 2018-05-22 (×7): qty 1

## 2018-05-22 MED ORDER — FENTANYL CITRATE (PF) 100 MCG/2ML IJ SOLN
INTRAMUSCULAR | Status: AC
  Administered 2018-05-22: 25 ug via INTRAVENOUS
  Filled 2018-05-22: qty 2

## 2018-05-22 MED ORDER — MENTHOL 3 MG MT LOZG
1.0000 | LOZENGE | OROMUCOSAL | Status: DC | PRN
  Filled 2018-05-22: qty 9

## 2018-05-22 MED ORDER — OXYCODONE HCL 5 MG/5ML PO SOLN: 5.0000 mg | Freq: Once | ORAL | Status: DC | PRN

## 2018-05-22 MED ORDER — BUPIVACAINE LIPOSOME 1.3 % IJ SUSP
INTRAMUSCULAR | Status: AC
  Filled 2018-05-22: qty 20

## 2018-05-22 MED ORDER — TRANEXAMIC ACID-NACL 1000-0.7 MG/100ML-% IV SOLN
1000.0000 mg | Freq: Once | INTRAVENOUS | Status: AC
  Administered 2018-05-22: 1000 mg via INTRAVENOUS
  Filled 2018-05-22: qty 100

## 2018-05-22 MED ORDER — DEXAMETHASONE SODIUM PHOSPHATE 10 MG/ML IJ SOLN
8.0000 mg | Freq: Once | INTRAMUSCULAR | Status: AC
  Administered 2018-05-22: 8 mg via INTRAVENOUS

## 2018-05-22 MED ORDER — METOCLOPRAMIDE HCL 5 MG/ML IJ SOLN: 5.0000 mg | Freq: Three times a day (TID) | INTRAMUSCULAR | Status: DC | PRN

## 2018-05-22 MED ORDER — FERROUS SULFATE 325 (65 FE) MG PO TABS
325.0000 mg | ORAL_TABLET | Freq: Two times a day (BID) | ORAL | Status: DC
  Administered 2018-05-22 – 2018-05-26 (×8): 325 mg via ORAL
  Filled 2018-05-22 (×8): qty 1

## 2018-05-22 MED ORDER — VASOPRESSIN 20 UNIT/ML IV SOLN
INTRAVENOUS | Status: AC
  Filled 2018-05-22: qty 1

## 2018-05-22 MED ORDER — SUGAMMADEX SODIUM 200 MG/2ML IV SOLN
INTRAVENOUS | Status: AC
  Filled 2018-05-22: qty 2

## 2018-05-22 MED ORDER — NEOMYCIN-POLYMYXIN B GU 40-200000 IR SOLN
Status: DC | PRN
  Administered 2018-05-22: 14 mL

## 2018-05-22 MED ORDER — PANTOPRAZOLE SODIUM 40 MG PO TBEC
40.0000 mg | DELAYED_RELEASE_TABLET | Freq: Two times a day (BID) | ORAL | Status: DC
  Administered 2018-05-22 – 2018-05-26 (×5): 40 mg via ORAL
  Filled 2018-05-22 (×7): qty 1

## 2018-05-22 MED ORDER — PROPOFOL 500 MG/50ML IV EMUL
INTRAVENOUS | Status: AC
  Filled 2018-05-22: qty 100

## 2018-05-22 MED ORDER — ROCURONIUM BROMIDE 100 MG/10ML IV SOLN
INTRAVENOUS | Status: DC | PRN
  Administered 2018-05-22: 20 mg via INTRAVENOUS

## 2018-05-22 MED ORDER — ONDANSETRON HCL 4 MG/2ML IJ SOLN: 4.0000 mg | Freq: Four times a day (QID) | INTRAMUSCULAR | Status: DC | PRN

## 2018-05-22 MED ORDER — TRAZODONE HCL 50 MG PO TABS
50.0000 mg | ORAL_TABLET | Freq: Every day | ORAL | Status: DC
  Administered 2018-05-23 – 2018-05-25 (×3): 50 mg via ORAL
  Filled 2018-05-22 (×3): qty 1

## 2018-05-22 MED ORDER — ACETAMINOPHEN 325 MG PO TABS
325.0000 mg | ORAL_TABLET | Freq: Four times a day (QID) | ORAL | Status: DC | PRN
  Administered 2018-05-24 (×2): 650 mg via ORAL
  Filled 2018-05-22 (×2): qty 2

## 2018-05-22 MED ORDER — SUCCINYLCHOLINE CHLORIDE 20 MG/ML IJ SOLN
INTRAMUSCULAR | Status: DC | PRN
  Administered 2018-05-22: 80 mg via INTRAVENOUS

## 2018-05-22 MED ORDER — SODIUM CHLORIDE 0.9 % IV SOLN
INTRAVENOUS | Status: DC | PRN
  Administered 2018-05-22: 30 ug/min via INTRAVENOUS

## 2018-05-22 MED ORDER — SUGAMMADEX SODIUM 200 MG/2ML IV SOLN
INTRAVENOUS | Status: DC | PRN
  Administered 2018-05-22: 150 mg via INTRAVENOUS

## 2018-05-22 MED ORDER — OXYCODONE HCL 5 MG PO TABS: 5.0000 mg | ORAL_TABLET | Freq: Once | ORAL | Status: DC | PRN

## 2018-05-22 MED ORDER — CELECOXIB 200 MG PO CAPS
ORAL_CAPSULE | ORAL | Status: AC
  Filled 2018-05-22: qty 2

## 2018-05-22 MED ORDER — ISOSORBIDE MONONITRATE ER 30 MG PO TB24
60.0000 mg | ORAL_TABLET | Freq: Every day | ORAL | Status: DC
  Administered 2018-05-24 – 2018-05-26 (×2): 60 mg via ORAL
  Filled 2018-05-22 (×3): qty 2

## 2018-05-22 MED ORDER — OXYCODONE HCL 5 MG PO TABS
5.0000 mg | ORAL_TABLET | ORAL | Status: DC | PRN
  Administered 2018-05-22 – 2018-05-26 (×6): 5 mg via ORAL
  Filled 2018-05-22 (×7): qty 1

## 2018-05-22 MED ORDER — METOCLOPRAMIDE HCL 10 MG PO TABS: 5.0000 mg | ORAL_TABLET | Freq: Three times a day (TID) | ORAL | Status: DC | PRN

## 2018-05-22 MED ORDER — ONDANSETRON HCL 4 MG PO TABS: 4.0000 mg | ORAL_TABLET | Freq: Four times a day (QID) | ORAL | Status: DC | PRN

## 2018-05-22 MED ORDER — SEVELAMER CARBONATE 800 MG PO TABS
2400.0000 mg | ORAL_TABLET | Freq: Three times a day (TID) | ORAL | Status: DC
  Administered 2018-05-22 – 2018-05-26 (×12): 2400 mg via ORAL
  Filled 2018-05-22 (×13): qty 3

## 2018-05-22 MED ORDER — TETRACAINE HCL 1 % IJ SOLN
INTRAMUSCULAR | Status: DC | PRN
  Administered 2018-05-22: 5 mg via INTRASPINAL

## 2018-05-22 MED ORDER — BUPIVACAINE HCL (PF) 0.25 % IJ SOLN
INTRAMUSCULAR | Status: AC
  Filled 2018-05-22: qty 60

## 2018-05-22 MED ORDER — GABAPENTIN 300 MG PO CAPS
ORAL_CAPSULE | ORAL | Status: AC
  Administered 2018-05-22: 300 mg via ORAL
  Filled 2018-05-22: qty 1

## 2018-05-22 MED ORDER — SODIUM CHLORIDE 0.9 % IV SOLN
INTRAVENOUS | Status: DC | PRN
  Administered 2018-05-22: 60 mL

## 2018-05-22 MED ORDER — SODIUM CHLORIDE 0.9 % IV BOLUS
250.0000 mL | Freq: Once | INTRAVENOUS | Status: AC
  Administered 2018-05-22: 250 mL via INTRAVENOUS

## 2018-05-22 MED ORDER — ONDANSETRON HCL 4 MG/2ML IJ SOLN
INTRAMUSCULAR | Status: DC | PRN
  Administered 2018-05-22: 4 mg via INTRAVENOUS

## 2018-05-22 MED ORDER — CLOPIDOGREL BISULFATE 75 MG PO TABS
75.0000 mg | ORAL_TABLET | Freq: Every day | ORAL | Status: DC
  Administered 2018-05-24 – 2018-05-26 (×2): 75 mg via ORAL
  Filled 2018-05-22 (×3): qty 1

## 2018-05-22 MED ORDER — BISACODYL 10 MG RE SUPP: 10.0000 mg | Freq: Every day | RECTAL | Status: DC | PRN

## 2018-05-22 MED ORDER — DIPHENHYDRAMINE HCL 12.5 MG/5ML PO ELIX: 12.5000 mg | ORAL_SOLUTION | ORAL | Status: DC | PRN

## 2018-05-22 MED ORDER — CHLORHEXIDINE GLUCONATE 4 % EX LIQD: 60.0000 mL | Freq: Once | CUTANEOUS | Status: DC

## 2018-05-22 MED ORDER — NITROGLYCERIN 0.4 MG SL SUBL: 0.4000 mg | SUBLINGUAL_TABLET | SUBLINGUAL | Status: DC | PRN

## 2018-05-22 MED ORDER — SEVELAMER CARBONATE 800 MG PO TABS
1600.0000 mg | ORAL_TABLET | ORAL | Status: DC
  Administered 2018-05-22 – 2018-05-26 (×2): 1600 mg via ORAL
  Filled 2018-05-22 (×13): qty 2

## 2018-05-22 MED ORDER — SODIUM CHLORIDE 0.9 % IV SOLN
INTRAVENOUS | Status: DC
  Administered 2018-05-22: 07:00:00 via INTRAVENOUS

## 2018-05-22 MED ORDER — ACETAMINOPHEN 10 MG/ML IV SOLN
1000.0000 mg | Freq: Four times a day (QID) | INTRAVENOUS | Status: AC
  Administered 2018-05-22 – 2018-05-23 (×4): 1000 mg via INTRAVENOUS
  Filled 2018-05-22 (×4): qty 100

## 2018-05-22 MED ORDER — FAMOTIDINE 20 MG PO TABS
20.0000 mg | ORAL_TABLET | Freq: Once | ORAL | Status: AC
  Administered 2018-05-22: 20 mg via ORAL

## 2018-05-22 MED ORDER — ALUM & MAG HYDROXIDE-SIMETH 200-200-20 MG/5ML PO SUSP: 30.0000 mL | ORAL | Status: DC | PRN

## 2018-05-22 MED ORDER — SODIUM CHLORIDE 0.9 % IV SOLN
INTRAVENOUS | Status: DC
  Administered 2018-05-22 – 2018-05-23 (×3): via INTRAVENOUS

## 2018-05-22 MED ORDER — PHENYLEPHRINE HCL 10 MG/ML IJ SOLN
INTRAMUSCULAR | Status: DC | PRN
  Administered 2018-05-22 (×4): 100 ug via INTRAVENOUS
  Administered 2018-05-22: 200 ug via INTRAVENOUS
  Administered 2018-05-22: 100 ug via INTRAVENOUS

## 2018-05-22 MED ORDER — VASOPRESSIN 20 UNIT/ML IV SOLN
INTRAVENOUS | Status: DC | PRN
  Administered 2018-05-22 (×5): 1 [IU] via INTRAVENOUS

## 2018-05-22 MED ORDER — ACETAMINOPHEN 10 MG/ML IV SOLN
INTRAVENOUS | Status: AC
  Filled 2018-05-22: qty 100

## 2018-05-22 MED ORDER — METOCLOPRAMIDE HCL 10 MG PO TABS
10.0000 mg | ORAL_TABLET | Freq: Three times a day (TID) | ORAL | Status: AC
  Administered 2018-05-22 – 2018-05-24 (×5): 10 mg via ORAL
  Filled 2018-05-22 (×6): qty 1

## 2018-05-22 MED ORDER — OXYCODONE HCL 5 MG PO TABS: 10.0000 mg | ORAL_TABLET | ORAL | Status: DC | PRN

## 2018-05-22 MED ORDER — FLEET ENEMA 7-19 GM/118ML RE ENEM: 1.0000 | ENEMA | Freq: Once | RECTAL | Status: DC | PRN

## 2018-05-22 MED ORDER — GABAPENTIN 300 MG PO CAPS
300.0000 mg | ORAL_CAPSULE | Freq: Every day | ORAL | Status: DC
  Administered 2018-05-22 – 2018-05-25 (×4): 300 mg via ORAL
  Filled 2018-05-22 (×4): qty 1

## 2018-05-22 MED ORDER — CLINDAMYCIN PHOSPHATE 900 MG/50ML IV SOLN
INTRAVENOUS | Status: AC
  Filled 2018-05-22: qty 50

## 2018-05-22 MED ORDER — PHENOL 1.4 % MT LIQD
1.0000 | OROMUCOSAL | Status: DC | PRN
  Filled 2018-05-22: qty 177

## 2018-05-22 MED ORDER — FAMOTIDINE 20 MG PO TABS
ORAL_TABLET | ORAL | Status: AC
  Administered 2018-05-22: 20 mg via ORAL
  Filled 2018-05-22: qty 1

## 2018-05-22 MED ORDER — CINACALCET HCL 30 MG PO TABS
60.0000 mg | ORAL_TABLET | Freq: Every day | ORAL | Status: DC
  Administered 2018-05-22 – 2018-05-26 (×4): 60 mg via ORAL
  Filled 2018-05-22 (×5): qty 2

## 2018-05-22 MED ORDER — PHENYLEPHRINE HCL 10 MG/ML IJ SOLN
INTRAMUSCULAR | Status: AC
  Filled 2018-05-22: qty 1

## 2018-05-22 MED ORDER — CLINDAMYCIN PHOSPHATE 600 MG/50ML IV SOLN
600.0000 mg | Freq: Four times a day (QID) | INTRAVENOUS | Status: AC
  Administered 2018-05-22 – 2018-05-23 (×4): 600 mg via INTRAVENOUS
  Filled 2018-05-22 (×4): qty 50

## 2018-05-22 MED ORDER — ACETAMINOPHEN 10 MG/ML IV SOLN
INTRAVENOUS | Status: DC | PRN
  Administered 2018-05-22: 1000 mg via INTRAVENOUS

## 2018-05-22 MED ORDER — PROPOFOL 10 MG/ML IV BOLUS
INTRAVENOUS | Status: DC | PRN
  Administered 2018-05-22: 80 mg via INTRAVENOUS

## 2018-05-22 MED ORDER — TAMSULOSIN HCL 0.4 MG PO CAPS
0.4000 mg | ORAL_CAPSULE | Freq: Every day | ORAL | Status: DC
  Administered 2018-05-22 – 2018-05-26 (×5): 0.4 mg via ORAL
  Filled 2018-05-22 (×5): qty 1

## 2018-05-22 MED ORDER — MIDAZOLAM HCL 5 MG/5ML IJ SOLN
INTRAMUSCULAR | Status: DC | PRN
  Administered 2018-05-22: 2 mg via INTRAVENOUS

## 2018-05-22 MED ORDER — GABAPENTIN 300 MG PO CAPS
300.0000 mg | ORAL_CAPSULE | Freq: Once | ORAL | Status: AC
  Administered 2018-05-22: 300 mg via ORAL

## 2018-05-22 MED ORDER — BUPIVACAINE HCL 0.25 % IJ SOLN
INTRAMUSCULAR | Status: DC | PRN
  Administered 2018-05-22: 60 mL

## 2018-05-22 MED ORDER — ONDANSETRON HCL 4 MG/2ML IJ SOLN
INTRAMUSCULAR | Status: AC
  Filled 2018-05-22: qty 2

## 2018-05-22 MED ORDER — MAGNESIUM HYDROXIDE 400 MG/5ML PO SUSP
30.0000 mL | Freq: Every day | ORAL | Status: DC
  Administered 2018-05-23 – 2018-05-24 (×2): 30 mL via ORAL
  Filled 2018-05-22 (×3): qty 30

## 2018-05-22 MED ORDER — ROSUVASTATIN CALCIUM 10 MG PO TABS
10.0000 mg | ORAL_TABLET | Freq: Every day | ORAL | Status: DC
  Administered 2018-05-22 – 2018-05-26 (×4): 10 mg via ORAL
  Filled 2018-05-22 (×4): qty 1

## 2018-05-22 MED ORDER — DEXAMETHASONE SODIUM PHOSPHATE 10 MG/ML IJ SOLN
INTRAMUSCULAR | Status: AC
  Administered 2018-05-22: 8 mg via INTRAVENOUS
  Filled 2018-05-22: qty 1

## 2018-05-22 MED ORDER — VITAMIN B-6 50 MG PO TABS
100.0000 mg | ORAL_TABLET | Freq: Every day | ORAL | Status: DC
  Administered 2018-05-22 – 2018-05-26 (×4): 100 mg via ORAL
  Filled 2018-05-22 (×5): qty 2

## 2018-05-22 SURGICAL SUPPLY — 86 items
ADAPTER BOLT FEMORAL +2/-2 (Knees) ×3 IMPLANT
AUGMENT FMRL POST PFC 4MM SZ5 (Orthopedic Implant) ×1 IMPLANT
AUGMENT POST 5 8 (Orthopedic Implant) ×2 IMPLANT
AUGMENT POST 5 8MM (Orthopedic Implant) ×1 IMPLANT
BLADE OSCILLATING/SAGITTAL (BLADE) ×2
BLADE SAW 1 (BLADE) ×3 IMPLANT
BLADE SAW 1/2 (BLADE) ×3 IMPLANT
BLADE SW THK.38XMED LNG THN (BLADE) ×1 IMPLANT
BONE CEMENT GENTAMICIN (Cement) ×6 IMPLANT
BONE CORTICO CANC 40ML HUMAN T (Bone Implant) ×3 IMPLANT
CANISTER SUCT 1200ML W/VALVE (MISCELLANEOUS) ×3 IMPLANT
CANISTER SUCT 3000ML PPV (MISCELLANEOUS) ×3 IMPLANT
CEMENT BONE GENTAMICIN 40 (Cement) ×2 IMPLANT
CNTNR SPEC 2.5X3XGRAD LEK (MISCELLANEOUS) ×1
CONT SPEC 4OZ STER OR WHT (MISCELLANEOUS) ×2
CONTAINER SPEC 2.5X3XGRAD LEK (MISCELLANEOUS) ×1 IMPLANT
COOLER POLAR GLACIER W/PUMP (MISCELLANEOUS) ×3 IMPLANT
COVER WAND RF STERILE (DRAPES) ×3 IMPLANT
CUFF TOURN 24 STER (MISCELLANEOUS) IMPLANT
CUFF TOURN 30 STER DUAL PORT (MISCELLANEOUS) IMPLANT
DRAPE SHEET LG 3/4 BI-LAMINATE (DRAPES) ×6 IMPLANT
DRAPE TABLE BACK 80X90 (DRAPES) ×3 IMPLANT
DRSG DERMACEA 8X12 NADH (GAUZE/BANDAGES/DRESSINGS) ×3 IMPLANT
DRSG OPSITE POSTOP 4X14 (GAUZE/BANDAGES/DRESSINGS) ×3 IMPLANT
DURAPREP 26ML APPLICATOR (WOUND CARE) ×3 IMPLANT
ELECT CAUTERY BLADE 6.4 (BLADE) ×3 IMPLANT
ELECT REM PT RETURN 9FT ADLT (ELECTROSURGICAL) ×3
ELECTRODE REM PT RTRN 9FT ADLT (ELECTROSURGICAL) ×1 IMPLANT
FEM POST AUG PFC 4MM SZ5 (Orthopedic Implant) ×3 IMPLANT
FEM TC3 PFC SIGMA SZ5 (Orthopedic Implant) ×3 IMPLANT
FEMORAL ADAPTER (Orthopedic Implant) ×3 IMPLANT
FEMORAL TC3 PFC SIGMA SZ5 (Orthopedic Implant) ×1 IMPLANT
GAUZE SPONGE 4X4 12PLY STRL (GAUZE/BANDAGES/DRESSINGS) ×3 IMPLANT
GLOVE BIOGEL M STRL SZ7.5 (GLOVE) ×3 IMPLANT
GLOVE BIOGEL PI IND STRL 9 (GLOVE) ×1 IMPLANT
GLOVE BIOGEL PI INDICATOR 9 (GLOVE) ×2
GLOVE INDICATOR 8.0 STRL GRN (GLOVE) ×3 IMPLANT
GLOVE SURG SYN 9.0  PF PI (GLOVE) ×2
GLOVE SURG SYN 9.0 PF PI (GLOVE) ×1 IMPLANT
GOWN STRL REUS W/ TWL LRG LVL3 (GOWN DISPOSABLE) ×2 IMPLANT
GOWN STRL REUS W/TWL 2XL LVL3 (GOWN DISPOSABLE) ×3 IMPLANT
GOWN STRL REUS W/TWL LRG LVL3 (GOWN DISPOSABLE) ×4
HANDPIECE VERSAJET DEBRIDEMENT (MISCELLANEOUS) ×3 IMPLANT
HEMOVAC 400CC 10FR (MISCELLANEOUS) ×3 IMPLANT
HOOD PEEL AWAY FLYTE STAYCOOL (MISCELLANEOUS) ×6 IMPLANT
IRRIGATION STRYKERFLOW (MISCELLANEOUS) ×1 IMPLANT
IRRIGATOR STRYKERFLOW (MISCELLANEOUS) ×3
KIT TURNOVER KIT A (KITS) ×3 IMPLANT
KNIFE SCULPS 14X20 (INSTRUMENTS) ×3 IMPLANT
NDL SAFETY ECLIPSE 18X1.5 (NEEDLE) ×1 IMPLANT
NEEDLE HYPO 18GX1.5 SHARP (NEEDLE) ×2
NEEDLE SPNL 18GX3.5 QUINCKE PK (NEEDLE) ×3 IMPLANT
NEEDLE SPNL 20GX3.5 QUINCKE YW (NEEDLE) ×6 IMPLANT
NS IRRIG 1000ML POUR BTL (IV SOLUTION) ×3 IMPLANT
PACK TOTAL KNEE (MISCELLANEOUS) ×3 IMPLANT
PAD ABD DERMACEA PRESS 5X9 (GAUZE/BANDAGES/DRESSINGS) ×3 IMPLANT
PAD WRAPON POLAR KNEE (MISCELLANEOUS) ×1 IMPLANT
PATELLA DOME PFC 41MM (Knees) ×3 IMPLANT
PENCIL SMOKE ULTRAEVAC 22 CON (MISCELLANEOUS) ×3 IMPLANT
PLATE ROT INSERT 10MM SIZE 5 (Plate) ×3 IMPLANT
PULSAVAC PLUS IRRIG FAN TIP (DISPOSABLE) ×3
PUTTY DBX 10CC (Bone Implant) ×3 IMPLANT
SLEEVE UNIV FEM FUL PRO SZ46MM (Sleeve) ×3 IMPLANT
SOL .9 NS 3000ML IRR  AL (IV SOLUTION) ×2
SOL .9 NS 3000ML IRR UROMATIC (IV SOLUTION) ×1 IMPLANT
SPONGE LAP 18X18 RF (DISPOSABLE) ×3 IMPLANT
STAPLER SKIN PROX 35W (STAPLE) ×3 IMPLANT
STEM UNIVERSAL REVISION 75X16 (Stem) ×3 IMPLANT
SUCTION FRAZIER HANDLE 10FR (MISCELLANEOUS) ×2
SUCTION TUBE FRAZIER 10FR DISP (MISCELLANEOUS) ×1 IMPLANT
SUT MNCRL 0 1X36 CT-1 (SUTURE) ×1 IMPLANT
SUT MON AB 2-0 CT1 36 (SUTURE) ×3 IMPLANT
SUT MONOCRYL 0 (SUTURE) ×2
SUT PROLENE 1 CT 1 30 (SUTURE) ×3 IMPLANT
SUT VIC AB 0 CT1 36 (SUTURE) ×3 IMPLANT
SUT VIC AB 1 CT1 36 (SUTURE) ×6 IMPLANT
SUT VIC AB 2-0 CT1 (SUTURE) ×3 IMPLANT
SWAB CULTURE AMIES ANAERIB BLU (MISCELLANEOUS) ×21 IMPLANT
SYR 20CC LL (SYRINGE) ×3 IMPLANT
SYR 30ML LL (SYRINGE) ×6 IMPLANT
TIP BRUSH PULSAVAC PLUS 24.33 (MISCELLANEOUS) ×3 IMPLANT
TIP FAN IRRIG PULSAVAC PLUS (DISPOSABLE) ×1 IMPLANT
TOWEL OR 17X26 4PK STRL BLUE (TOWEL DISPOSABLE) ×3 IMPLANT
TOWER CARTRIDGE SMART MIX (DISPOSABLE) ×3 IMPLANT
TRAY FOLEY MTR SLVR 16FR STAT (SET/KITS/TRAYS/PACK) ×3 IMPLANT
WRAPON POLAR PAD KNEE (MISCELLANEOUS) ×3

## 2018-05-22 NOTE — Transfer of Care (Signed)
Immediate Anesthesia Transfer of Care Note  Patient: Jimmy Carson  Procedure(s) Performed: TOTAL KNEE REVISION (Right Knee)  Patient Location: PACU  Anesthesia Type:General and Spinal  Level of Consciousness: drowsy  Airway & Oxygen Therapy: Patient Spontanous Breathing and Patient connected to face mask oxygen  Post-op Assessment: Report given to RN and Post -op Vital signs reviewed and stable  Post vital signs: Reviewed and stable  Last Vitals:  Vitals Value Taken Time  BP 103/61 121-Aug-202019  1:29 PM  Temp 36.4 C 121-Aug-202019  1:27 PM  Pulse 89 121-Aug-202019  1:29 PM  Resp 15 121-Aug-202019  1:29 PM  SpO2 100 % 121-Aug-202019  1:29 PM  Vitals shown include unvalidated device data.  Last Pain:  Vitals:   05/22/18 0623  TempSrc: Tympanic  PainSc: 5          Complications: No apparent anesthesia complications

## 2018-05-22 NOTE — H&P (Signed)
The patient has been re-examined, and the chart reviewed, and there have been no interval changes to the documented history and physical.    The risks, benefits, and alternatives have been discussed at length. The patient expressed understanding of the risks benefits and agreed with plans for surgical intervention.  Aris Moman P. Laticia Vannostrand, [name] M.D.    

## 2018-05-22 NOTE — Anesthesia Procedure Notes (Signed)
Performed by: Lance Muss, CRNA Pre-anesthesia Checklist: Emergency Drugs available, Patient identified, Suction available, Patient being monitored and Timeout performed Patient Re-evaluated:Patient Re-evaluated prior to induction Oxygen Delivery Method: Simple face mask

## 2018-05-22 NOTE — NC FL2 (Signed)
[place] LEVEL OF CARE SCREENING TOOL     IDENTIFICATION  Patient Name: Jimmy Carson Birthdate: 1941/04/12 Sex: male Admission Date (Current Location): 12020/09/2417  [place] and [place] Number:  Engineering geologist and Address:  [facility], [address], [place], [place] [address]      Provider Number: [id number]  Attending Physician Name and Address:  Dereck Leep, MD  Relative Name and Phone Number:       Current Level of Care: Hospital Recommended Level of Care: [place] Prior Approval Number:    Date Approved/Denied:   PASRR Number: [id number])  Discharge Plan: SNF    Current Diagnoses: Patient Active Problem List   Diagnosis Date Noted  . History of revision of total knee arthroplasty 12020/09/2417  . CAD S/P percutaneous coronary angioplasty 05/15/2018  . ESRD on hemodialysis ([place]) 05/15/2018  . Insomnia 05/15/2018  . Pure hypercholesterolemia 05/15/2018  . Pleural effusion on left 08/30/2017  . HTN (hypertension) 08/30/2017  . CAD (coronary artery disease) 08/30/2017  . HLD (hyperlipidemia) 08/30/2017  . 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 Pulmonary emphysema ([place]) 07/18/2016  . Other specified complication of vascular prosthetic devices, implants and grafts, initial encounter ([place]) 05/08/2016  . Complete tear of left rotator cuff 01/12/2016  . Rotator cuff arthropathy, left 01/12/2016  . Rotator cuff arthropathy, right 01/12/2016  . NSTEMI (non-ST elevated myocardial infarction) ([place]) 07/22/2015  . Chest pain 07/21/2015  . Non-rheumatic mitral regurgitation 07/18/2015  . History of prostate cancer 12/02/2014  . Lymphopenia 12/02/2014  . 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 S/P CABG x 6 02/16/2014  . Discitis 02/11/2013  . Anemia of chronic disease 12/04/2012  . Hyperparathyroidism, secondary renal ([place])  12/04/2012  . Congenital polycystic kidney 12/03/2012  . PKD (polycystic kidney disease) 12/03/2012    Orientation RESPIRATION BLADDER Height & Weight     Self, Time, Situation, Place  O2(2 Liters Oxygen. ) Continent Weight: 171 lb 8.3 oz (77.8 kg) Height:  5\' 10"  (177.8 cm)  BEHAVIORAL SYMPTOMS/MOOD NEUROLOGICAL BOWEL NUTRITION STATUS      Continent Diet(Diet: Renal Diet. )  AMBULATORY STATUS COMMUNICATION OF NEEDS Skin   Extensive Assist Verbally Surgical wounds(Incision: Right Knee. )                       Personal Care Assistance Level of Assistance  Bathing, Feeding, Dressing Bathing Assistance: Limited assistance Feeding assistance: Independent Dressing Assistance: Limited assistance     Functional Limitations Info  Sight, Hearing, Speech Sight Info: Adequate Hearing Info: Adequate Speech Info: Adequate    SPECIAL CARE FACTORS FREQUENCY  PT (By licensed PT), OT (By licensed OT)(Dialysis patient. )     PT Frequency: (5) OT Frequency: (5)            Contractures      Additional Factors Info  Code Status, Allergies Code Status Info: (Full Code. ) Allergies Info: (Orange Fruit Citrus, Penicillins)           Current Medications (12020/09/2417):  This is the current hospital active medication list Current Facility-Administered Medications  Medication Dose Route Frequency Provider Last Rate Last Dose  . 0.9 %  sodium chloride infusion   Intravenous Continuous Hooten, Laurice Record, MD 100 mL/hr at 05/22/18 1644    . acetaminophen (OFIRMEV) IV 1,000 mg  1,000 mg Intravenous Q6H Hooten, Laurice Record, MD 400 mL/hr at 05/22/18 1725 1,000  mg at 05/22/18 1725  . [START ON 05/23/2018] acetaminophen (TYLENOL) tablet 325-650 mg  325-650 mg Oral Q6H PRN Hooten, Laurice Record, MD      . alum & mag hydroxide-simeth (MAALOX/MYLANTA) 200-200-20 MG/5ML suspension 30 mL  30 mL Oral Q4H PRN Hooten, Laurice Record, MD      . bisacodyl (DULCOLAX) suppository 10 mg  10 mg Rectal Daily PRN Hooten,  Laurice Record, MD      . celecoxib (CELEBREX) 200 MG capsule           . cinacalcet (SENSIPAR) tablet 60 mg  60 mg Oral Q breakfast Hooten, Laurice Record, MD   60 mg at 05/22/18 1639  . clindamycin (CLEOCIN) IVPB 600 mg  600 mg Intravenous Q6H Hooten, Laurice Record, MD 100 mL/hr at 05/22/18 1645 600 mg at 05/22/18 1645  . clopidogrel (PLAVIX) tablet 75 mg  75 mg Oral Daily Hooten, Laurice Record, MD      . diphenhydrAMINE (BENADRYL) 12.5 MG/5ML elixir 12.5-25 mg  12.5-25 mg Oral Q4H PRN Hooten, Laurice Record, MD      . ferrous sulfate tablet 325 mg  325 mg Oral BID WC Hooten, Laurice Record, MD   325 mg at 05/22/18 1639  . gabapentin (NEURONTIN) capsule 300 mg  300 mg Oral QHS Hooten, Laurice Record, MD      . HYDROmorphone (DILAUDID) injection 0.5-1 mg  0.5-1 mg Intravenous Q4H PRN Hooten, Laurice Record, MD      . isosorbide mononitrate (IMDUR) 24 hr tablet 60 mg  60 mg Oral Daily Hooten, Laurice Record, MD      . magnesium hydroxide (MILK OF MAGNESIA) suspension 30 mL  30 mL Oral Daily Hooten, Laurice Record, MD      . menthol-cetylpyridinium (CEPACOL) lozenge 3 mg  1 lozenge Oral PRN Hooten, Laurice Record, MD       Or  . phenol (CHLORASEPTIC) mouth spray 1 spray  1 spray Mouth/Throat PRN Hooten, Laurice Record, MD      . metoCLOPramide (REGLAN) tablet 5-10 mg  5-10 mg Oral Q8H PRN Hooten, Laurice Record, MD       Or  . metoCLOPramide (REGLAN) injection 5-10 mg  5-10 mg Intravenous Q8H PRN Hooten, Laurice Record, MD      . metoCLOPramide (REGLAN) tablet 10 mg  10 mg Oral TID AC & HS Hooten, Laurice Record, MD   10 mg at 05/22/18 1640  . nitroGLYCERIN (NITROSTAT) SL tablet 0.4 mg  0.4 mg Sublingual Q5 min PRN Hooten, Laurice Record, MD      . ondansetron (ZOFRAN) tablet 4 mg  4 mg Oral Q6H PRN Hooten, Laurice Record, MD       Or  . ondansetron (ZOFRAN) injection 4 mg  4 mg Intravenous Q6H PRN Hooten, Laurice Record, MD      . oxyCODONE (Oxy IR/ROXICODONE) immediate release tablet 10 mg  10 mg Oral Q4H PRN Hooten, Laurice Record, MD      . oxyCODONE (Oxy IR/ROXICODONE) immediate release tablet 5 mg  5 mg Oral Q4H PRN  Hooten, Laurice Record, MD   5 mg at 05/22/18 1720  . pantoprazole (PROTONIX) EC tablet 40 mg  40 mg Oral BID Hooten, Laurice Record, MD      . pyridOXINE (VITAMIN B-6) tablet 100 mg  100 mg Oral Daily Hooten, Laurice Record, MD   100 mg at 05/22/18 1639  . rosuvastatin (CRESTOR) tablet 10 mg  10 mg Oral Daily Hooten, Laurice Record, MD   10 mg at 05/22/18 1551  .  senna-docusate (Senokot-S) tablet 1 tablet  1 tablet Oral BID Hooten, Laurice Record, MD      . sevelamer carbonate (RENVELA) tablet 1,600 mg  1,600 mg Oral With snacks Hooten, Laurice Record, MD      . sevelamer carbonate (RENVELA) tablet 2,400 mg  2,400 mg Oral TID WC Hooten, Laurice Record, MD   2,400 mg at 05/22/18 1639  . sodium phosphate (FLEET) 7-19 GM/118ML enema 1 enema  1 enema Rectal Once PRN Hooten, Laurice Record, MD      . tamsulosin (FLOMAX) capsule 0.4 mg  0.4 mg Oral Daily Hooten, Laurice Record, MD   0.4 mg at 05/22/18 1551  . traMADol (ULTRAM) tablet 50-100 mg  50-100 mg Oral Q4H PRN Hooten, Laurice Record, MD      . traZODone (DESYREL) tablet 50 mg  50 mg Oral QHS Hooten, Laurice Record, MD         Discharge Medications: Please see discharge summary for a list of discharge medications.  Relevant Imaging Results:  Relevant Lab Results:   Additional Information (SSN: [id number])  Haylie Mccutcheon, Veronia Beets, LCSW

## 2018-05-22 NOTE — Progress Notes (Signed)
BP 79/45. Dr. Roland Rack made aware. Order to give 250cc of normal saline bolus received.

## 2018-05-22 NOTE — Op Note (Signed)
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 M.D.  ASSISTANT:  Vance Peper, PA (present and scrubbed throughout the case, critical for assistance with exposure, retraction, instrumentation, and closure)  ANESTHESIA: spinal and general  ESTIMATED BLOOD LOSS: 125 mL  FLUIDS REPLACED: 400 mL of crystalloid  TOURNIQUET TIME: #1 - 120 minutes #2 - 67 minutes  DRAINS: 2 medium Hemovac drains  IMPLANTS UTILIZED: DePuy TC3 Sigma size 5 femoral component (cemented), 5 degree femoral adapter, adapter bolt, 48 mm porous-coated femoral sleeve, 75 mm x 16 mm universal fluted stem, 8 mm posteromedial augment, 4 mm posterolateral augment, 41 mm 3 peg oval dome patella (cemented), and a 10 mm stabilized rotating platform polyethylene insert.  Corticocancellus allograft bone and 10 cc of DBX putty was also utilized.  INDICATIONS FOR SURGERY: Jimmy Carson is a 77 y.o. year old male who previously underwent a right total knee arthroplasty.  He had recently had the onset of some discomfort as well as swelling.. X-rays demonstrated osteolytic/cystic lesions to the distal femur. The patient had not seen any significant improvement despite conservative nonsurgical intervention. After discussion of the risks and benefits of surgical intervention, the patient expressed understanding of the risks benefits and agree with plans for revision of the total knee arthroplasty.   The risks, benefits, and alternatives were discussed at length including but not limited to the risks of infection, bleeding, nerve injury, stiffness, blood clots, the need for revision surgery, cardiopulmonary complications, among  others, and they were willing to proceed.  PROCEDURE IN DETAIL: The patient was brought into the operating room and, after adequate spinal anesthesia (and subsequently general anesthesia) was achieved, a tourniquet was placed on the patient's upper thigh. The patient's knee and leg were cleaned and prepped with alcohol and DuraPrep and draped in the usual sterile fashion. A "timeout" was performed as per usual protocol. The lower extremity was exsanguinated using an Esmarch, and the tourniquet was inflated to 300 mmHg. An anterior longitudinal incision was made and approximately 15 mL of yellow fluid was aspirated and submitted for complete cell count, crystals, stat Gram stain, and culture.  A medial parapatellar approach was utilized. The deep fibers of the medial collateral ligament were elevated in a subperiosteal fashion off of the medial flare of the tibia so as to maintain a continuous soft tissue sleeve. The patella was subluxed laterally and the patellofemoral ligament was incised.  Swabs were obtained from the medial and lateral gutters and submitted for stat Gram stain and cultures.  Inspection of the knee demonstrated abundant inflamed synovial tissue.  An extensive synovectomy was performed extending along the medial and lateral gutters and along the suprapatellar pouch.  The polyethylene was removed and was noted to have wear to both the post as well as along the posteromedial aspect.  Swabs were obtained of the posterior recess and submitted for stat Gram stain and culture.  Synovectomy was performed along the posterior recess of the knee.  The patella was visualized and also noted to have significant polyethylene wear.  The interface between the implants and the bone was cleared of synovial tissue using rongeurs and curettes.  Ostial lysis was noted involving the posterior flanges of the femoral component.  The interface  along the anterior, anterior chamfer, and distal aspect of the femoral  component was in reasonably good condition.  A TPS saw and then osteotomes were used to carefully free the femoral implant.  A large cystic lesion was encountered that had resulted in lysis of bone involving both the medial and lateral condyles and extended posteriorly.  The lesion was removed using curettes and rongeurs and submitted to pathology.  Swabs were also obtained for stat Gram stain and cultures.  Next, the interface between the tibial component and the the proximal tibia was evaluated.  The interface appeared to be stable with the exception of a small anterolateral area.  The femoral canal was reamed in a sequential fashion up to a 16 mm diameter.  Serial broaches were then inserted for the femoral sleeve up to a 48 mm broach size.  Essentially no cleanup cut was required distally.  The 3 and 1 cutting guide was placed in the +2 position.  Chamfer cuts were refreshed and cuts were performed so as to accommodate a 4 mm posterior augment laterally and an 8 mm exterior augment medially.  The central box cut was then freshened.  All components were placed along with a 10 mm polyethylene trial.  Full extension was noted good mediolateral soft tissue balancing.  The tourniquet was deflated after initial tourniquet time 120 minutes.  Attention was then directed to the patella.  First a TPS saw was used to initiate the cut and then the oscillating saw was used to disrupt the interface between the patella component and the patellar bone.  Good thickness was noted of the remaining patella.  Patellar trial was positioned so 3041 mm patella was appropriate.  Patellar trial was placed and he was placed range of motion with good patellar tracking noted.  Trials were removed.  The 48 mm broach was reinserted into the femur.  Cortical cancellus bone and 10 cc of DBX putty was mixed and impacted along the medial and lateral femoral condyles as well as along the posterior aspect of the broach.  Jimmy Carson was then  removed.  Cut surfaces of bone had been irrigated and cleaned.  The right lower extremity was exsanguinated again using an Esmarch and the tourniquet was reinflated to 300 mmHg.  The femoral construct consisted of a TC 3 Sigma femoral implant with a +2 mm adapter bolt and a 5 degree adapter, 48 mm fully porous-coated femoral sleeve, and a 75 mm x 16 mm universal fluted stem.  A +8 mm posterior augment was placed medially and a +4 mm posterior augment was placed laterally.    Polymethylmethacrylate cement with gentamicin was prepared in the usual fashion using a vacuum mixer. Cement was applied to the cut surfaces of the femur as well as along the posterior flanges of the size femoral construct. The femoral component was positioned and impacted into place. Excess cement was removed using [employer] fast streamer. A 10 mm polyethylene trial was inserted and the knee was brought into full extension with steady axial compression applied. Finally, cement was applied to the backside of a 41 mm 3 peg oval dome patella and the patellar component was positioned and patellar clamp applied. Excess cement was removed using [employer] fast streamer. After adequate curing of the cement, the tourniquet was deflated after a second tourniquet time of 67 minutes. Hemostasis was achieved using electrocautery. The knee was irrigated with copious amounts of normal saline with antibiotic solution using pulsatile lavage and then suctioned dry. 20 mL of 1.3% Exparel  and 60 mL of 0.25% Marcaine in 40 mL of normal saline was injected along the posterior capsule, medial and lateral gutters, and along the arthrotomy site. A 10 mm stabilized rotating platform polyethylene insert was inserted and the knee was placed through a range of motion with excellent mediolateral soft tissue balancing appreciated and excellent patellar tracking noted. 2 medium drains were placed in the wound bed and brought out through separate stab incisions. The medial parapatellar  portion of the incision was reapproximated using interrupted sutures of #1 Vicryl. Subcutaneous tissue was approximated in layers using first #0 Vicryl followed #2-0 Vicryl. The skin was approximated with skin staples. A sterile dressing was applied.  The patient tolerated the procedure well and was transported to the recovery room in stable condition.    Jimmy Carson P. Holley Bouche., M.D.

## 2018-05-22 NOTE — Anesthesia Preprocedure Evaluation (Addendum)
Anesthesia Evaluation  Patient identified by MRN, date of birth, ID band Patient awake    Reviewed: Allergy & Precautions, H&P , NPO status , Patient's Chart, lab work & pertinent test results  History of Anesthesia Complications Negative for: history of anesthetic complications  Airway Mallampati: III  TM Distance: <3 FB Neck ROM: limited    Dental  (+) Chipped   Pulmonary neg shortness of breath, former smoker,           Cardiovascular Exercise Tolerance: Good hypertension, (-) angina+ CAD, + Past MI, + Cardiac Stents and + CABG       Neuro/Psych negative neurological ROS  negative psych ROS   GI/Hepatic negative GI ROS, Neg liver ROS,   Endo/Other  negative endocrine ROS  Renal/GU DialysisRenal disease     Musculoskeletal  (+) Arthritis ,   Abdominal   Peds  Hematology negative hematology ROS (+)   Anesthesia Other Findings Past Medical History: No date: Anemia No date: CAD (coronary artery disease)     Comment:  s/p percutaneous coronary angioplasty, s/p CABG x 6  No date: ESRD (end stage renal disease) ([facility]) No date: Heart attack ([place]) No date: History of radiation therapy     Comment:  for prostate CA No date: Hyperlipidemia No date: Hyperparathyroidism, secondary renal ([place]) No date: Hypertension No date: Osteoarthritis No date: Polycystic kidney disease 2012: Prostate CA ([place])     Comment:  Brachytherapy No date: Renal insufficiency     Comment:  currently on dialysis No date: Shingles  Past Surgical History: No date: AORTIC VALVE REPLACEMENT 07/24/2015: CARDIAC CATHETERIZATION; N/A     Comment:  Procedure: Coronary/Graft Angiography;  Surgeon: Corey Skains, MD;  Location: [facility];  Service:               Cardiovascular;  Laterality: N/A; 06/03/2016: CARDIAC CATHETERIZATION; N/A     Comment:  Procedure: Left Heart Cath and Coronary Angiography;    Surgeon: Isaias Cowman, MD;  Location: [facility];  Service: Cardiovascular;  Laterality:               N/A; No date: CORONARY ARTERY BYPASS GRAFT No date: HERNIA REPAIR No date: JOINT REPLACEMENT No date: REPLACEMENT TOTAL KNEE BILATERAL No date: ROTATOR CUFF REPAIR; Right  BMI    Body Mass Index:  24.61 kg/m      Reproductive/Obstetrics negative OB ROS                            Anesthesia Physical Anesthesia Plan  ASA: IV  Anesthesia Plan: Spinal   Post-op Pain Management:    Induction:   PONV Risk Score and Plan:   Airway Management Planned: Natural Airway and Nasal Cannula  Additional Equipment:   Intra-op Plan:   Post-operative Plan:   Informed Consent: I have reviewed the patients History and Physical, chart, labs and discussed the procedure including the risks, benefits and alternatives for the proposed anesthesia with the patient or authorized representative who has indicated his/her understanding and acceptance.   Dental Advisory Given  Plan Discussed with: Anesthesiologist, CRNA and Surgeon  Anesthesia Plan Comments: (Patient would like spinal for this procedure  Patient reports no bleeding problems and no anticoagulant use, Patient reports no plavix in 9 days  Plan for spinal  with backup GA  Patient consented for risks of anesthesia including but not limited to:  - adverse reactions to medications - risk of bleeding, infection, nerve damage and headache - risk of failed spinal - damage to teeth, lips or other oral mucosa - sore throat or hoarseness - Damage to heart, brain, lungs or loss of life  Patient voiced understanding.)       Anesthesia Quick Evaluation

## 2018-05-22 NOTE — Anesthesia Procedure Notes (Signed)
Procedure Name: Intubation Performed by: Nelda Marseille, CRNA Pre-anesthesia Checklist: Patient identified, Patient being monitored, Timeout performed, Emergency Drugs available and Suction available Patient Re-evaluated:Patient Re-evaluated prior to induction Oxygen Delivery Method: Circle System Utilized Preoxygenation: Pre-oxygenation with 100% oxygen Induction Type: IV induction Ventilation: Mask ventilation without difficulty Laryngoscope Size: Mac and 3 Grade View: Grade I Tube type: Oral Tube size: 7.5 mm Number of attempts: 1 Airway Equipment and Method: Stylet Placement Confirmation: ETT inserted through vocal cords under direct vision,  positive ETCO2 and breath sounds checked- equal and bilateral Secured at: 22 cm Tube secured with: Tape Dental Injury: Teeth and Oropharynx as per pre-operative assessment

## 2018-05-22 NOTE — Anesthesia Post-op Follow-up Note (Signed)
Anesthesia QCDR form completed.        

## 2018-05-22 NOTE — OR Nursing (Signed)
Explanted femur, insert, and patella right knee.

## 2018-05-22 NOTE — Progress Notes (Signed)
Pt blood pressures are soft at times. RN will continue to monitor. Pt is not symptomatic and is in no acute distress. MD made aware. Pt denies dizziness and other VS WNL.  Will monitor closely. Pt is in ESRD.

## 2018-05-23 LAB — ACID FAST SMEAR (AFB, MYCOBACTERIA): Acid Fast Smear: NEGATIVE

## 2018-05-23 MED ORDER — EPOETIN ALFA 10000 UNIT/ML IJ SOLN
4000.0000 [IU] | Freq: Once | INTRAMUSCULAR | Status: AC
  Administered 2018-05-23: 4000 [IU] via INTRAVENOUS

## 2018-05-23 MED ORDER — CHLORHEXIDINE GLUCONATE CLOTH 2 % EX PADS
6.0000 | MEDICATED_PAD | Freq: Every day | CUTANEOUS | Status: DC
  Administered 2018-05-23 – 2018-05-26 (×3): 6 via TOPICAL

## 2018-05-23 NOTE — Progress Notes (Signed)
PRE HD   

## 2018-05-23 NOTE — Progress Notes (Signed)
HD start 

## 2018-05-23 NOTE — Progress Notes (Signed)
Patient arrived back from dialysis. VSS. No complaints of pain. Bone foam placed back on left leg, polar care in place. Continue to monitor.

## 2018-05-23 NOTE — Progress Notes (Signed)
[facility], [place] 05/23/18  Subjective:   Patient known to our practice from previous admissions.  He underwent right total knee revision arthroplasty by Dr. Dina Rich on November 29.  Nephrology consult for routine hemodialysis. Patient states he is able to eat without nausea or vomiting.  No shortness of breath.  No leg edema.  Objective:  Vital signs in last 24 hours:  Temp:  [97.2 F (36.2 C)-98.2 F (36.8 C)] 98 F (36.7 C) (11/30 0438) Pulse Rate:  [66-98] 66 (11/30 0755) Resp:  [13-22] 18 (11/30 0755) BP: (76-127)/(45-69) 102/56 (11/30 0755) SpO2:  [88 %-100 %] 100 % (11/30 0755)  Weight change:  Filed Weights   05/22/18 0623  Weight: 77.8 kg    Intake/Output:    Intake/Output Summary (Last 24 hours) at 05/23/2018 0847 Last data filed at 05/23/2018 0816 Gross per 24 hour  Intake 1876.19 ml  Output 160 ml  Net 1716.19 ml     Physical Exam: General:  Laying in the bed, no acute distress  HEENT  anicteric, moist oral mucous membranes  Neck  supple  Pulm/lungs  normal breathing effort, clear to auscultation, [place] O2  CVS/Heart regular with ectopic beats  Abdomen:   Soft, nontender  Extremities: Rt knee bandaged, no edema  Neurologic:  Alert, oriented  Skin:  No acute rashes  Access: LUE aneurysmal AVF       Basic Metabolic Panel:  Recent Labs  Lab 05/22/18 0628 05/22/18 0638  NA 134* 133*  K 4.0 3.9  CL 90*  --   CO2 30  --   GLUCOSE 80 72  BUN 47*  --   CREATININE 7.50*  --   CALCIUM 8.1*  --      CBC: Recent Labs  Lab 05/22/18 0628 05/22/18 0638  WBC 5.8  --   HGB 9.0* 9.5*  HCT 30.1* 28.0*  MCV 91.2  --   PLT 191  --      No results found for: HEPBSAG, HEPBSAB, HEPBIGM    Microbiology:  Recent Results (from the past 240 hour(s))  Aerobic/Anaerobic Culture (surgical/deep wound)     Status: None (Preliminary result)   Collection Time: 05/22/18  8:20 AM  Result Value Ref Range Status   Specimen  Description KNEE  Final   Special Requests RIGHT  Final   Gram Stain   Final    RARE WBC SEEN C/MINDY HUFFINES AT 1050 05/22/18.PMF Performed at [facility], [address] [place], [place] [address]    Culture PENDING  Incomplete   Report Status PENDING  Incomplete  Aerobic/Anaerobic Culture (surgical/deep wound)     Status: None (Preliminary result)   Collection Time: 05/22/18  8:28 AM  Result Value Ref Range Status   Specimen Description TISSUE  Final   Special Requests NONE  Final   Gram Stain   Final    RARE WBC SEEN C/MINDY HUFFINES AT 1050 05/22/18.PMF Performed at [facility], [address] [place], [place] [address]    Culture PENDING  Incomplete   Report Status PENDING  Incomplete  Aerobic/Anaerobic Culture (surgical/deep wound)     Status: None (Preliminary result)   Collection Time: 05/22/18  8:35 AM  Result Value Ref Range Status   Specimen Description KNEE  Final   Special Requests NONE  Final   Gram Stain   Final    RARE WBC SEEN C/MINDY HUFFINES AT 1050 05/22/18.PMH Performed at [facility], [address], [place], [place] [address]  Culture PENDING  Incomplete   Report Status PENDING  Incomplete  Aerobic/Anaerobic Culture (surgical/deep wound)     Status: None (Preliminary result)   Collection Time: 05/22/18  8:36 AM  Result Value Ref Range Status   Specimen Description KNEE  Final   Special Requests RIGHT  Final   Gram Stain   Final    NO ORGANISMS SEEN RARE WBC SEEN C/MINDY HUFFINES AT 1050 05/22/18.PMF Performed at [facility], [address] [place], [place] [address]    Culture PENDING  Incomplete   Report Status PENDING  Incomplete  Aerobic/Anaerobic Culture (surgical/deep wound)     Status: None (Preliminary result)   Collection Time: 05/22/18  8:54 AM  Result Value Ref Range Status   Specimen Description KNEE  Final   Special Requests RIGHT  Final   Gram Stain   Final    NO ORGANISMS SEEN RARE  WBC SEEN C/MINDY HUFFINES AT 1050 05/22/18.PMF Performed at [facility], [address] [place], [place] [address]    Culture PENDING  Incomplete   Report Status PENDING  Incomplete  Aerobic/Anaerobic Culture (surgical/deep wound)     Status: None (Preliminary result)   Collection Time: 05/22/18  9:34 AM  Result Value Ref Range Status   Specimen Description KNEE  Final   Special Requests NONE  Final   Gram Stain   Final    FEW WBC SEEN C/MINDY HUFFINES AT 1050 05/22/18.PMF Performed at [facility], [address] [place], [place] [address]    Culture PENDING  Incomplete   Report Status PENDING  Incomplete    Coagulation Studies: Recent Labs    05/22/18 0628  LABPROT 15.1  INR 1.20    Urinalysis: No results for input(s): COLORURINE, LABSPEC, PHURINE, GLUCOSEU, HGBUR, BILIRUBINUR, KETONESUR, PROTEINUR, UROBILINOGEN, NITRITE, LEUKOCYTESUR in the last 72 hours.  Invalid input(s): APPERANCEUR    Imaging: Dg Knee Right Port  Result Date: 112-Mar-202019 CLINICAL DATA:  Total knee replacement EXAM: PORTABLE RIGHT KNEE - 1-2 VIEW COMPARISON:  CT 04/02/2018 FINDINGS: Changes of right total knee revision. Soft tissue gas and drains noted. No hardware bony complicating feature. IMPRESSION: Right knee replacement revision. No acute bony abnormality or hardware complicating feature. Electronically Signed   By: Rolm Baptise M.D.   On: 112-Mar-202019 13:50     Medications:   . sodium chloride 100 mL/hr at 05/23/18 0327  . acetaminophen Stopped (05/23/18 0519)  . clindamycin (CLEOCIN) IV 600 mg (05/23/18 0323)   . cinacalcet  60 mg Oral Q breakfast  . clopidogrel  75 mg Oral Daily  . ferrous sulfate  325 mg Oral BID WC  . gabapentin  300 mg Oral QHS  . isosorbide mononitrate  60 mg Oral Daily  . magnesium hydroxide  30 mL Oral Daily  . metoCLOPramide  10 mg Oral TID AC & HS  . pantoprazole  40 mg Oral BID  . pyridOXINE  100 mg Oral Daily  . rosuvastatin  10 mg  Oral Daily  . senna-docusate  1 tablet Oral BID  . sevelamer carbonate  1,600 mg Oral With snacks  . sevelamer carbonate  2,400 mg Oral TID WC  . tamsulosin  0.4 mg Oral Daily  . traZODone  50 mg Oral QHS   acetaminophen, alum & mag hydroxide-simeth, bisacodyl, diphenhydrAMINE, HYDROmorphone (DILAUDID) injection, menthol-cetylpyridinium **OR** phenol, metoCLOPramide **OR** metoCLOPramide (REGLAN) injection, nitroGLYCERIN, ondansetron **OR** ondansetron (ZOFRAN) IV, oxyCODONE, oxyCODONE, sodium phosphate, traMADol  Assessment/ Plan:  77 y.o. Caucasian male with ESRD, hypothyroidism, hypertension,  BPH, CAD status post CABG, hyperlipidemia, right total knee revision arthroplasty May 22, 2018 Dr. Marry Guan  [facility] nephrology/MWF  1.  End-stage renal disease We will arrange for routine hemodialysis today  2.  Anemia of chronic kidney disease Hemoglobin 9.5 Continue Epogen with hemodialysis  3.  Secondary hyperparathyroidism Continue home dose of binders with meals and monitor phosphorus  4.  Status post total right knee arthroplasty Plan as per orthopedics team     LOS: [place] 11/30/20198:47 AM  [place], [place]  Note: This note was prepared with Dragon dictation. Any transcription errors are unintentional

## 2018-05-23 NOTE — Evaluation (Signed)
Physical Therapy Evaluation Patient Details Name: Jimmy Carson MRN: [id number] DOB: 1940-08-10 Today's Date: 05/23/2018   History of Present Illness  Pt is a 77 yo male with a PMH that includes HTN, CAD, MI, cardiac stents, CABG, CHF, COPD, anemia, ESRD on HD, prostate CA, OA, and aortic valve replacement.  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    Clinical Impression  Pt presents with deficits in strength, transfers, mobility, R knee ROM, gait, balance, and activity tolerance.  Pt required mod A during sit to/from stand transfer to prevent fall secondary to R knee buckling upon standing.  Once pt was in standing pt had no more instances of buckling and reported buckle occurred secondary to unexpected R knee pain with weight bearing.  Pt progressed in standing from left/right weight shifting to standing marching with antalgic RLE but with improved stability.  Pt then amb around 5' at the EOB with a RW, close CGA, and short step-to gait pattern.  Pt will benefit from PT services in a SNF setting upon discharge to safely address above deficits for decreased caregiver assistance and eventual return to PLOF.        Follow Up Recommendations SNF;Supervision for mobility/OOB    Equipment Recommendations  Other (comment)(TBD at next venue of care, will likely need a BSC)    Recommendations for Other Services       Precautions / Restrictions Precautions Precautions: Fall Precaution Comments: Watch BP Required Braces or Orthoses: Other Brace Other Brace: Pt able to perform Ind RLE SLRs without extensor lag, no KI donned. Restrictions Weight Bearing Restrictions: Yes RLE Weight Bearing: Weight bearing as tolerated      Mobility  Bed Mobility Overal bed mobility: Modified Independent             General bed mobility comments: Extra time and effort but no physical assistance required with bed  mobility tasks  Transfers Overall transfer level: Needs assistance Equipment used: Rolling walker (2 wheeled) Transfers: Sit to/from Stand Sit to Stand: Mod assist         General transfer comment: Mod A to prevent fall during initial sit to/stand secondary to RLE buckling that pt reported occured secondary to unexpected pain  Ambulation/Gait Ambulation/Gait assistance: Min guard Gait Distance (Feet): 5 Feet Assistive device: Rolling walker (2 wheeled) Gait Pattern/deviations: Step-to pattern;Decreased stance time - right;Decreased step length - left Gait velocity: decreased   General Gait Details: Slow, cautious cadence with close CGA and a RW with cues for step-to sequencing for safety; pt presented with no RLE buckling during limited amb but was slightly unsteady  Stairs            Wheelchair Mobility    Modified Rankin (Stroke Patients Only)       Balance Overall balance assessment: Needs assistance   Sitting balance-Leahy Scale: Good     Standing balance support: Bilateral upper extremity supported Standing balance-Leahy Scale: Fair                               Pertinent Vitals/Pain Pain Assessment: No/denies pain    Home Living Family/patient expects to be discharged to:: Private residence Living Arrangements: Alone Available Help at Discharge: Family;Available PRN/intermittently Type of Home: House Home Access: Stairs to enter Entrance Stairs-Rails: None Entrance Stairs-Number of Steps: 1 step to a small landing big enough for a RW then another 1  step into the house Home Layout: One level Home Equipment: [place] - 2 wheels;Cane - single point      Prior Function Level of Independence: Independent with assistive device(s)         Comments: Mod Ind ambulation with a SPC in the community and without an AD in the home, no fall hx but unsteady in the home per daughter, Ind with ADLs     Hand Dominance        Extremity/Trunk  Assessment   Upper Extremity Assessment Upper Extremity Assessment: Overall WFL for tasks assessed    Lower Extremity Assessment Lower Extremity Assessment: Generalized weakness;RLE deficits/detail RLE Deficits / Details: R hip flex >/= 3/5 RLE: Unable to fully assess due to pain       Communication   Communication: No difficulties  Cognition Arousal/Alertness: Awake/alert Behavior During Therapy: WFL for tasks assessed/performed Overall Cognitive Status: Within Functional Limits for tasks assessed                                        General Comments      Exercises Total Joint Exercises Ankle Circles/Pumps: AROM;Both;10 reps Quad Sets: AROM;Strengthening;Both;10 reps Gluteal Sets: Strengthening;Both;10 reps Hip ABduction/ADduction: AROM;Both;10 reps Straight Leg Raises: AROM;Both;10 reps Long Arc Quad: AROM;Right;5 reps;10 reps Knee Flexion: AROM;Right;5 reps;10 reps Goniometric ROM: R knee AROM: 10-91 deg Marching in Standing: AROM;Both;10 reps;Standing Other Exercises Other Exercises: HEP education per handout Other Exercises: Positioning education to promote R knee ext PROM while in bed/chair with heels floated   Assessment/Plan    PT Assessment Patient needs continued PT services  PT Problem List Decreased strength;Decreased range of motion;Decreased activity tolerance;Decreased balance;Decreased mobility;Decreased knowledge of use of DME       PT Treatment Interventions DME instruction;Gait training;Functional mobility training;Therapeutic exercise;Therapeutic activities;Balance training;Stair training;Patient/family education    PT Goals (Current goals can be found in the Care Plan section)  Acute Rehab PT Goals Patient Stated Goal: To get back to how I was PT Goal Formulation: With patient Time For Goal Achievement: 06/05/18 Potential to Achieve Goals: Good    Frequency BID   Barriers to discharge Inaccessible home  environment;Decreased caregiver support      Co-evaluation               AM-PAC PT "6 Clicks" Mobility  Outcome Measure Help needed turning from your back to your side while in a flat bed without using bedrails?: A Little Help needed moving from lying on your back to sitting on the side of a flat bed without using bedrails?: A Little Help needed moving to and from a bed to a chair (including a wheelchair)?: Total Help needed standing up from a chair using your arms (e.g., wheelchair or bedside chair)?: A Lot Help needed to walk in hospital room?: A Lot Help needed climbing 3-5 steps with a railing? : Total 6 Click Score: 12    End of Session Equipment Utilized During Treatment: Gait belt Activity Tolerance: Patient tolerated treatment well Patient left: in chair;with chair alarm set;with call bell/phone within reach;with family/visitor present;with SCD's reapplied;Other (comment)(Polar care donned to R knee) Nurse Communication: Mobility status;Other (comment)(Careful with R knee buckling x 1 during session) PT Visit Diagnosis: Unsteadiness on feet (R26.81);Muscle weakness (generalized) (M62.81);Difficulty in walking, not elsewhere classified (R26.2);Pain Pain - Right/Left: Right Pain - part of body: Knee    Time: 1525-1606 PT Time Calculation (min) (ACUTE  ONLY): 41 min   Charges:   PT Evaluation $PT Eval Low Complexity: 1 Low PT Treatments $Therapeutic Exercise: 8-22 mins $Therapeutic Activity: 8-22 mins        D. Royetta Asal PT, DPT 05/23/18, 4:59 PM

## 2018-05-23 NOTE — Progress Notes (Signed)
PT Cancellation Note  Patient Details Name: Yostin Malacara MRN: [id number] DOB: [birth year]   Cancelled Treatment:    Reason Eval/Treat Not Completed: Patient at procedure or test/unavailable: Pt at HD this AM, will attempt to see pt at a future date/time as appropriate.     Linus Salmons PT, DPT 05/23/18, 10:48 AM

## 2018-05-23 NOTE — Progress Notes (Signed)
OT Cancellation Note  Patient Details Name: Jimmy Carson MRN: [id number] DOB: 1941-06-21   Cancelled Treatment:     Mulitple attempts this date to see patient for OT evaluation however, patient is off the floor for the duration. Will attempt to see pt at a future date/time as appropriate.    Georgiann Neider T Juwann Sherk, OTR/L, CLT   Danecia Underdown 05/23/2018, 1:25 PM

## 2018-05-23 NOTE — Plan of Care (Signed)

## 2018-05-23 NOTE — Progress Notes (Signed)
   Subjective: 1 Day Post-Op Procedure(s) (LRB): TOTAL KNEE REVISION (Right) Patient reports pain as 0 on 0-10 scale.   Patient is well, and has had no acute complaints or problems. Slightly hypotensive yesterday but asymptomatic.  Patient given IV fluids and blood pressures have improved. Denies any CP, SOB, ABD pain. We will start physical therapy today.    Objective: Vital signs in last 24 hours: Temp:  [97.2 F (36.2 C)-98.2 F (36.8 C)] 98 F (36.7 C) (11/30 0438) Pulse Rate:  [66-98] 66 (11/30 0755) Resp:  [13-22] 18 (11/30 0755) BP: (76-127)/(45-69) 102/56 (11/30 0755) SpO2:  [88 %-100 %] 100 % (11/30 0755)  Intake/Output from previous day: 11/29 0701 - 11/30 0700 In: 1776.2 [I.V.:1310.2; IV Piggyback:466] Out: 160 [Urine:10; Blood:150] Intake/Output this shift: Total I/O In: 100 [IV Piggyback:100] Out: -   Recent Labs    05/22/18 0628 05/22/18 0638  HGB 9.0* 9.5*   Recent Labs    05/22/18 0628 05/22/18 0638  WBC 5.8  --   RBC 3.30*  --   HCT 30.1* 28.0*  PLT 191  --    Recent Labs    05/22/18 0628 05/22/18 0638  NA 134* 133*  K 4.0 3.9  CL 90*  --   CO2 30  --   BUN 47*  --   CREATININE 7.50*  --   GLUCOSE 80 72  CALCIUM 8.1*  --    Recent Labs    05/22/18 0628  INR 1.20    EXAM General - Patient is Alert, Appropriate and Oriented Extremity - Neurovascular intact Sensation intact distally Intact pulses distally Dorsiflexion/Plantar flexion intact No cellulitis present Compartment soft Dressing - dressing C/D/I and no drainage, Hemovac intact Motor Function - intact, moving foot and toes well on exam.   Past Medical History:  Diagnosis Date  . Anemia   . CAD (coronary artery disease)    s/p percutaneous coronary angioplasty, s/p CABG x 6   . ESRD (end stage renal disease) ([place])   . Heart attack ([place])   . History of radiation therapy    for prostate CA  . Hyperlipidemia   . Hyperparathyroidism, secondary renal ([place])   .  Hypertension   . Osteoarthritis   . Polycystic kidney disease   . Prostate CA [facility]) 2012   Brachytherapy  . Renal insufficiency    currently on dialysis  . Shingles     Assessment/Plan:   1 Day Post-Op Procedure(s) (LRB): TOTAL KNEE REVISION (Right) Active Problems:   History of revision of total knee arthroplasty  Estimated body mass index is 24.61 kg/m as calculated from the following:   Height as of this encounter: 5\' 10"  (1.778 m).   Weight as of this encounter: 77.8 kg. Advance diet Up with therapy, weightbearing as tolerated right lower extremity Needs bowel movement Patient going to dialysis this morning Labs and vital signs are stable, recheck labs in the morning Care management to assist with discharge  DVT Prophylaxis - TED hose and Plavix, SCDs Weight-Bearing as tolerated to right leg   T. Rachelle Hora, PA-C [place] 05/23/2018, 9:14 AM

## 2018-05-24 LAB — CBC
HCT: 28.1 % — ABNORMAL LOW (ref 39.0–52.0)
Hemoglobin: 8 g/dL — ABNORMAL LOW (ref 13.0–17.0)
MCH: 27 pg (ref 26.0–34.0)
MCHC: 28.5 g/dL — ABNORMAL LOW (ref 30.0–36.0)
MCV: 94.9 fL (ref 80.0–100.0)
Platelets: 164 10*3/uL (ref 150–400)
RBC: 2.96 MIL/uL — ABNORMAL LOW (ref 4.22–5.81)
RDW: 18.6 % — ABNORMAL HIGH (ref 11.5–15.5)
WBC: 7.8 10*3/uL (ref 4.0–10.5)
nRBC: 0 % (ref 0.0–0.2)

## 2018-05-24 LAB — BASIC METABOLIC PANEL
Anion gap: 13 (ref 5–15)
BUN: 32 mg/dL — ABNORMAL HIGH (ref 8–23)
CO2: 29 mmol/L (ref 22–32)
Calcium: 8 mg/dL — ABNORMAL LOW (ref 8.9–10.3)
Chloride: 93 mmol/L — ABNORMAL LOW (ref 98–111)
Creatinine, Ser: 5.52 mg/dL — ABNORMAL HIGH (ref 0.61–1.24)
GFR calc Af Amer: 11 mL/min — ABNORMAL LOW (ref >60)
GFR, EST NON AFRICAN AMERICAN: 9 mL/min — AB (ref >60)
GLUCOSE: 110 mg/dL — AB (ref 70–99)
Potassium: 4.7 mmol/L (ref 3.5–5.1)
Sodium: 135 mmol/L (ref 135–145)

## 2018-05-24 MED ORDER — EPOETIN ALFA 4000 UNIT/ML IJ SOLN
4000.0000 [IU] | INTRAMUSCULAR | Status: DC
  Administered 2018-05-25: 4000 [IU] via INTRAVENOUS
  Filled 2018-05-24: qty 1

## 2018-05-24 NOTE — Progress Notes (Signed)
   Subjective: 2 Days Post-Op Procedure(s) (LRB): TOTAL KNEE REVISION (Right) Patient reports pain as mild.   Patient is well, and has had no acute complaints or problems. Denies any CP, SOB, ABD pain. We will start physical therapy today.   Patient underwent dialysis yesterday.   Objective: Vital signs in last 24 hours: Temp:  [97.6 F (36.4 C)-98.2 F (36.8 C)] 98 F (36.7 C) (11/30 1721) Pulse Rate:  [66-82] 76 (12/01 0749) Resp:  [11-24] 18 (11/30 2311) BP: (82-107)/(48-71) 104/67 (12/01 0749) SpO2:  [94 %-100 %] 98 % (12/01 0749) Weight:  [83.1 kg] 83.1 kg (11/30 1325)  Intake/Output from previous day: 11/30 0701 - 12/01 0700 In: 350.1 [P.O.:100; IV Piggyback:250.1] Out: 30 [Drains:30] Intake/Output this shift: No intake/output data recorded.  Recent Labs    05/22/18 0628 05/22/18 0638  HGB 9.0* 9.5*   Recent Labs    05/22/18 0628 05/22/18 0638  WBC 5.8  --   RBC 3.30*  --   HCT 30.1* 28.0*  PLT 191  --    Recent Labs    05/22/18 0628 05/22/18 0638  NA 134* 133*  K 4.0 3.9  CL 90*  --   CO2 30  --   BUN 47*  --   CREATININE 7.50*  --   GLUCOSE 80 72  CALCIUM 8.1*  --    Recent Labs    05/22/18 0628  INR 1.20    EXAM General - Patient is Alert, Appropriate and Oriented Extremity - Neurovascular intact Sensation intact distally Intact pulses distally Dorsiflexion/Plantar flexion intact No cellulitis present Compartment soft Dressing - dressing C/D/I and no drainage, Hemovac removed Motor Function - intact, moving foot and toes well on exam.   Past Medical History:  Diagnosis Date  . Anemia   . CAD (coronary artery disease)    s/p percutaneous coronary angioplasty, s/p CABG x 6   . ESRD (end stage renal disease) ([place])   . Heart attack ([place])   . History of radiation therapy    for prostate CA  . Hyperlipidemia   . Hyperparathyroidism, secondary renal ([place])   . Hypertension   . Osteoarthritis   . Polycystic kidney disease   .  Prostate CA [facility]) 2012   Brachytherapy  . Renal insufficiency    currently on dialysis  . Shingles     Assessment/Plan:   2 Days Post-Op Procedure(s) (LRB): TOTAL KNEE REVISION (Right) Active Problems:   History of revision of total knee arthroplasty   Acute post op blood loss anemia with underlying anemia of chronic kidney disease  Estimated body mass index is 26.29 kg/m as calculated from the following:   Height as of this encounter: 5\' 10"  (1.778 m).   Weight as of this encounter: 83.1 kg. Advance diet Up with therapy, weightbearing as tolerated right lower extremity Needs bowel movement Labs pending Vital signs stable  Care management to assist with discharge to SNF  DVT Prophylaxis - TED hose and Plavix, SCDs Weight-Bearing as tolerated to right leg   T. Rachelle Hora, PA-C [place] 05/24/2018, 9:05 AM

## 2018-05-24 NOTE — Progress Notes (Signed)
[facility], [place] 05/24/18  Subjective:   He underwent right total knee revision arthroplasty by Dr. Marry Guan on November 29.  Tolerated dialysis well yesterday Able to eat without nausea or vomiting No shortness of breath or leg edema  Objective:  Vital signs in last 24 hours:  Temp:  [97.6 F (36.4 C)-98.2 F (36.8 C)] 98 F (36.7 C) (11/30 1721) Pulse Rate:  [70-82] 76 (12/01 0749) Resp:  [16-24] 18 (11/30 2311) BP: (84-107)/(50-67) 104/67 (12/01 0749) SpO2:  [94 %-100 %] 98 % (12/01 0749) Weight:  [83.1 kg] 83.1 kg (11/30 1325)  Weight change:  Filed Weights   05/22/18 0623 05/23/18 1325  Weight: 77.8 kg 83.1 kg    Intake/Output:    Intake/Output Summary (Last 24 hours) at 05/24/2018 1125 Last data filed at 05/24/2018 0957 Gross per 24 hour  Intake 250.11 ml  Output 30 ml  Net 220.11 ml     Physical Exam: General:  Laying in the bed, no acute distress  HEENT  anicteric, moist oral mucous membranes  Neck  supple  Pulm/lungs  normal breathing effort, clear to auscultation, room air  CVS/Heart regular with ectopic beats  Abdomen:   Soft, nontender  Extremities: Rt knee bandaged, no edema  Neurologic:  Alert, oriented  Skin:  No acute rashes  Access: LUE aneurysmal AVF       Basic Metabolic Panel:  Recent Labs  Lab 05/22/18 0628 05/22/18 0638 05/24/18 0953  NA 134* 133* 135  K 4.0 3.9 4.7  CL 90*  --  93*  CO2 30  --  29  GLUCOSE 80 72 110*  BUN 47*  --  32*  CREATININE 7.50*  --  5.52*  CALCIUM 8.1*  --  8.0*     CBC: Recent Labs  Lab 05/22/18 0628 05/22/18 0638 05/24/18 0953  WBC 5.8  --  7.8  HGB 9.0* 9.5* 8.0*  HCT 30.1* 28.0* 28.1*  MCV 91.2  --  94.9  PLT 191  --  164     No results found for: HEPBSAG, HEPBSAB, HEPBIGM    Microbiology:  Recent Results (from the past 240 hour(s))  Aerobic/Anaerobic Culture (surgical/deep wound)     Status: None (Preliminary result)   Collection Time: 05/22/18   8:20 AM  Result Value Ref Range Status   Specimen Description   Final    KNEE Performed at [facility], [address], [place], [place] [address]    Special Requests   Final    RIGHT Performed at [facility], [address] [place], [place] [address]    Gram Stain   Final    RARE WBC SEEN C/MINDY HUFFINES AT 1050 05/22/18.PMF Performed at [facility], [address], [place], [place] [address]    Culture   Final    NO GROWTH < 24 HOURS Performed at [facility], [place] [address] [place], [place] [address]    Report Status PENDING  Incomplete  Aerobic/Anaerobic Culture (surgical/deep wound)     Status: None (Preliminary result)   Collection Time: 05/22/18  8:28 AM  Result Value Ref Range Status   Specimen Description   Final    TISSUE Performed at [facility], [address], [place], [place] [address]    Special Requests   Final    NONE Performed at [facility], [address] [place],  [address]    Gram Stain   Final    RARE WBC SEEN  C/MINDY HUFFINES AT 1050 05/22/18.PMF Performed at [facility], [address], [place], [place] [address]    Culture   Final    NO GROWTH < 24 HOURS Performed at [facility], [place] [address], [place], [place] [address]    Report Status PENDING  Incomplete  Aerobic/Anaerobic Culture (surgical/deep wound)     Status: None (Preliminary result)   Collection Time: 05/22/18  8:35 AM  Result Value Ref Range Status   Specimen Description   Final    KNEE Performed at [facility], [address], [place], [place] [address]    Special Requests   Final    NONE Performed at [facility], [address] [place], [place] [address]    Gram Stain   Final    RARE WBC SEEN C/MINDY HUFFINES AT 1050 05/22/18.PMH Performed at [facility], [address] [place], [place] [address]    Culture   Final    NO GROWTH < 24 HOURS Performed  at [place] [address], [place], [place] [address]    Report Status PENDING  Incomplete  Aerobic/Anaerobic Culture (surgical/deep wound)     Status: None (Preliminary result)   Collection Time: 05/22/18  8:36 AM  Result Value Ref Range Status   Specimen Description   Final    KNEE Performed at [facility], [address], [place], [place] [address]    Special Requests   Final    RIGHT Performed at [facility], [address] [place], [place] [address]    Gram Stain   Final    NO ORGANISMS SEEN RARE WBC SEEN C/MINDY HUFFINES AT 1050 05/22/18.PMF Performed at [facility], [address] [place], [place] [address]    Culture   Final    NO GROWTH < 24 HOURS Performed at [facility], [place] [address] [place], [place] [address]    Report Status PENDING  Incomplete  Aerobic/Anaerobic Culture (surgical/deep wound)     Status: None (Preliminary result)   Collection Time: 05/22/18  8:54 AM  Result Value Ref Range Status   Specimen Description   Final    KNEE Performed at [facility], [address], [place], [place] [address]    Special Requests   Final    RIGHT Performed at [facility], [address] [place], [place] [address]    Gram Stain   Final    NO ORGANISMS SEEN RARE WBC SEEN C/MINDY HUFFINES AT 1050 05/22/18.PMF Performed at [facility], [address], [place], [place] [address]    Culture   Final    NO GROWTH < 24 HOURS Performed at [facility], [place] [address], [place], [place] [address]    Report Status PENDING  Incomplete  Aerobic/Anaerobic Culture (surgical/deep wound)     Status: None (Preliminary result)   Collection Time: 05/22/18  9:34 AM  Result Value Ref Range Status   Specimen Description   Final    KNEE Performed at [facility], [address], [place], [place] [address]    Special Requests   Final    NONE Performed at [facility], [address] [place], [place] [address]    Gram Stain   Final    FEW WBC SEEN C/MINDY HUFFINES AT 1050 05/22/18.PMF Performed at [facility], [address] [place], [place] [address]    Culture   Final    NO  GROWTH < 24 HOURS Performed at [facility], [place] [address], [place], [place] [address]    Report Status PENDING  Incomplete  Acid Fast Smear (AFB)     Status: None   Collection Time: 05/22/18  2:36 PM  Result Value Ref Range Status   AFB Specimen Processing Comment  Final    Comment: Tissue Grinding and Digestion/Decontamination   Acid Fast Smear Negative  Final    Comment: (NOTE) Performed At: [facility] [address] [place], [place] [id number] Rush Farmer MD BS:[id number]    Source (AFB) TISSUE  Final    Coagulation Studies: Recent Labs    05/22/18 0628  LABPROT 15.1  INR 1.20    Urinalysis: No results for input(s): COLORURINE, LABSPEC, PHURINE, GLUCOSEU, HGBUR, BILIRUBINUR, KETONESUR, PROTEINUR, UROBILINOGEN, NITRITE, LEUKOCYTESUR in the last 72 hours.  Invalid input(s): APPERANCEUR    Imaging: Dg Knee Right Port  Result Date: 12020/06/717 CLINICAL DATA:  Total knee replacement EXAM: PORTABLE RIGHT KNEE - 1-2 VIEW COMPARISON:  CT 04/02/2018 FINDINGS: Changes of right total knee revision. Soft tissue gas and drains noted. No hardware bony complicating feature. IMPRESSION: Right knee replacement revision. No acute bony abnormality or hardware complicating feature. Electronically Signed   By: Rolm Baptise M.D.   On: 12020/06/717 13:50     Medications:    . Chlorhexidine Gluconate Cloth  6 each Topical Q0600  . cinacalcet  60 mg Oral Q breakfast  . clopidogrel  75 mg Oral Daily  . ferrous sulfate  325 mg Oral BID WC  . gabapentin  300 mg Oral QHS  . isosorbide mononitrate  60 mg Oral Daily  . magnesium hydroxide  30 mL Oral Daily  . metoCLOPramide  10 mg Oral TID AC & HS  . pantoprazole  40 mg Oral BID  . pyridOXINE  100 mg  Oral Daily  . rosuvastatin  10 mg Oral Daily  . senna-docusate  1 tablet Oral BID  . sevelamer carbonate  1,600 mg Oral With snacks  . sevelamer carbonate  2,400 mg Oral TID WC  . tamsulosin  0.4 mg Oral Daily  . traZODone  50 mg Oral QHS   acetaminophen, alum & mag hydroxide-simeth, bisacodyl, diphenhydrAMINE, HYDROmorphone (DILAUDID) injection, menthol-cetylpyridinium **OR** phenol, metoCLOPramide **OR** metoCLOPramide (REGLAN) injection, nitroGLYCERIN, ondansetron **OR** ondansetron (ZOFRAN) IV, oxyCODONE, oxyCODONE, sodium phosphate, traMADol  Assessment/ Plan:  77 y.o. Caucasian male with ESRD, hypothyroidism, hypertension, BPH, CAD status post CABG, hyperlipidemia, right total knee revision arthroplasty May 22, 2018 Dr. Marry Guan  [facility] nephrology/MWF  1.  End-stage renal disease We will arrange for routine hemodialysis for Monday  2.  Anemia of chronic kidney disease Hemoglobin 8.0 Continue Epogen with hemodialysis  3.  Secondary hyperparathyroidism Continue home dose of binders and cinacalcet with meals and monitor phosphorus  4.  Status post total right knee arthroplasty Plan as per orthopedics team     LOS: [place] 12/1/201911:25 AM  [place], [place]  Note: This note was prepared with Dragon dictation. Any transcription errors are unintentional

## 2018-05-24 NOTE — Progress Notes (Signed)
Patient resting in bed. Bone foam removed per patient request. Polar care in place, teds and foot pumps on. POA at bedside. NO complaints at this time. Bed in lowest position, call bell in reach, bed alarm on. Continue to monitor.

## 2018-05-24 NOTE — Progress Notes (Signed)
Physical Therapy Treatment Patient Details Name: Jimmy Carson MRN: [id number] DOB: Nov 03, 1940 Today's Date: 05/24/2018    History of Present Illness Pt is a 77 yo male with a PMH that includes HTN, CAD, MI, cardiac stents, CABG, CHF, COPD, anemia, ESRD on HD, prostate CA, OA, and aortic valve replacement.  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    PT Comments    Patient demonstrates adequate quadriceps strength on the bed and in bed level activities, however is noted to have significant buckling episode as well as several near buckling episodes on RLE. He did take several steps initially without incident and may be buckling secondary to pain rather than "weakness". He is otherwise progressing well, though still struggles to perform transfers without assistance secondary to decreased quadriceps strength.  Follow Up Recommendations  SNF;Supervision for mobility/OOB     Equipment Recommendations  Other (comment)(TBD at next venue of care, will likely need a BSC)    Recommendations for Other Services       Precautions / Restrictions Precautions Precautions: Fall Other Brace: Pt able to perform Ind RLE SLRs without extensor lag, no KI donned. Restrictions Weight Bearing Restrictions: Yes RLE Weight Bearing: Weight bearing as tolerated    Mobility  Bed Mobility Overal bed mobility: Needs Assistance Bed Mobility: Supine to Sit     Supine to sit: Min guard     General bed mobility comments: Patient required verbal cuing for sequencing and technique for transfer.   Transfers Overall transfer level: Needs assistance Equipment used: Rolling walker (2 wheeled) Transfers: Sit to/from Stand Sit to Stand: Mod assist         General transfer comment: Patient required elevated bed surface and mod A to complete transfer secondary to decreased RLE strength.   Ambulation/Gait Ambulation/Gait  assistance: Min guard Gait Distance (Feet): 12 Feet Assistive device: Rolling walker (2 wheeled) Gait Pattern/deviations: Step-to pattern;Decreased stance time - right;Decreased step length - left Gait velocity: decreased Gait velocity interpretation: <1.31 ft/sec, indicative of household ambulator General Gait Details: Patient had one significant episode of buckling requiring bracing from therapist. Chair follow afterwards as patient continued to have difficulty maintaining RLE in extension and had several near buckles on RLE even with cuing for increasing UE assistance.    Stairs             Wheelchair Mobility    Modified Rankin (Stroke Patients Only)       Balance Overall balance assessment: Needs assistance Sitting-balance support: Bilateral upper extremity supported Sitting balance-Leahy Scale: Good     Standing balance support: Bilateral upper extremity supported Standing balance-Leahy Scale: Fair                              Cognition Arousal/Alertness: Awake/alert Behavior During Therapy: WFL for tasks assessed/performed Overall Cognitive Status: Within Functional Limits for tasks assessed                                        Exercises Total Joint Exercises Ankle Circles/Pumps: AROM;Both;15 reps Quad Sets: AROM;Strengthening;Both;15 reps Gluteal Sets: Strengthening;Both;15 reps Heel Slides: AROM;15 reps;AAROM;Both Hip ABduction/ADduction: AROM;Both;15 reps Straight Leg Raises: AROM;Both;10 reps Long Arc Quad: AROM;Right;5 reps;10 reps Knee Flexion: AROM;Both;15 reps Goniometric ROM: R knee 6-92    General Comments  Pertinent Vitals/Pain Pain Assessment: Faces Faces Pain Scale: Hurts a little bit Pain Location: R knee  Pain Descriptors / Indicators: Aching;Operative site guarding Pain Intervention(s): Limited activity within patient's tolerance;Repositioned;Monitored during session    Home Living                       Prior Function            PT Goals (current goals can now be found in the care plan section) Acute Rehab PT Goals Patient Stated Goal: To get back to how I was PT Goal Formulation: With patient Time For Goal Achievement: 06/05/18 Potential to Achieve Goals: Good Progress towards PT goals: Progressing toward goals    Frequency    BID      PT Plan Current plan remains appropriate    Co-evaluation              AM-PAC PT "6 Clicks" Mobility   Outcome Measure  Help needed turning from your back to your side while in a flat bed without using bedrails?: A Little Help needed moving from lying on your back to sitting on the side of a flat bed without using bedrails?: A Little Help needed moving to and from a bed to a chair (including a wheelchair)?: A Lot Help needed standing up from a chair using your arms (e.g., wheelchair or bedside chair)?: A Lot Help needed to walk in hospital room?: A Lot Help needed climbing 3-5 steps with a railing? : Total 6 Click Score: 13    End of Session Equipment Utilized During Treatment: Gait belt Activity Tolerance: Patient tolerated treatment well Patient left: in chair;with chair alarm set;with call bell/phone within reach;with SCD's reapplied;Other (comment)(Polar care donned to R knee and towel roll to promote extension underneath R calf/rearfoot) Nurse Communication: Mobility status;Other (comment)(Careful with R knee buckling x 1 during session) PT Visit Diagnosis: Unsteadiness on feet (R26.81);Muscle weakness (generalized) (M62.81);Difficulty in walking, not elsewhere classified (R26.2);Pain Pain - Right/Left: Right Pain - part of body: Knee     Time: 5188-4166 PT Time Calculation (min) (ACUTE ONLY): 23 min  Charges:  $Gait Training: 8-22 mins $Therapeutic Exercise: 8-22 mins                     Royce Macadamia PT, DPT, CSCS   05/24/2018, 1:48 PM

## 2018-05-24 NOTE — Anesthesia Postprocedure Evaluation (Signed)
Anesthesia Post Note  Patient: Jimmy Carson  Procedure(s) Performed: TOTAL KNEE REVISION (Right Knee)  Patient location during evaluation: PACU Anesthesia Type: Spinal Level of consciousness: oriented and awake and alert Pain management: pain level controlled Vital Signs Assessment: post-procedure vital signs reviewed and stable Respiratory status: spontaneous breathing, respiratory function stable and patient connected to nasal cannula oxygen Cardiovascular status: blood pressure returned to baseline and stable Postop Assessment: no headache, no backache and no apparent nausea or vomiting Anesthetic complications: no     Last Vitals:  Vitals:   05/23/18 2311 05/24/18 0749  BP: (!) 92/54 104/67  Pulse: 76 76  Resp: 18   Temp:    SpO2:  98%    Last Pain:  Vitals:   05/24/18 0816  TempSrc:   PainSc: [place]

## 2018-05-24 NOTE — Clinical Social Work Note (Signed)
Clinical Social Work Assessment  Patient Details  Name: Jimmy Carson MRN: [id number] Date of Birth: 1941/03/16  Date of referral:  05/24/18               Reason for consult:  Facility Placement                Permission sought to share information with:  Chartered certified accountant granted to share information::  Yes, Verbal Permission Granted  Name::        Agency::  [facility]  Relationship::     Contact Information:     Housing/Transportation Living arrangements for the past 2 months:  Single Family Home Source of Information:  Patient, Power of Attorney Patient Interpreter Needed:  None Criminal Activity/Legal Involvement Pertinent to Current Situation/Hospitalization:  No - Comment as needed Significant Relationships:  Adult Children, Community Support Lives with:  Self Do you feel safe going back to the place where you live?  Yes Need for family participation in patient care:  No (Coment)  Care giving concerns:  PT recommendation for SNF   Social Worker assessment / plan: The CSW met with the patient and his HCPOA, Peter Congo, at bedside. The CSW introduced self and role in care. The patient and his HCPOA verbalized agreement with SNF referral. CSW explained that [facility] would require a prior authorization, and the CSW explained the referral process. Peter Congo explained that she would discuss bed offers with the patient's daughter, Denny Peon, once offers become available. The CSW also explained to the patient what to expect at SNF as he has never admitted for short term rehab. The patient has MWF dialysis at [facility] and is followed by [facility].  The CSW will follow up with bed offers as available. The patient will most likely discharge tomorrow or Tuesday pending medical clearance and insurance authorization. CSW is following.  Employment status:  Retired Nurse, adult PT Recommendations:  [place],  [place] / Referral to community resources:  [place]  Patient/Family's Response to care: The patient and his HCPOA thanked the CSW and were receptive to care.  Patient/Family's Understanding of and Emotional Response to Diagnosis, Current Treatment, and Prognosis: The patient and his family seem invested in the patient's quality of care and are in agreement with discharge to SNF when stable.  Emotional Assessment Appearance:  Appears stated age Attitude/Demeanor/Rapport:  Lethargic Affect (typically observed):  Stable Orientation:  Oriented to Self, Oriented to Place, Oriented to Situation, Oriented to  Time Alcohol / Substance use:  Never Used Psych involvement (Current and /or in the community):  No (Comment)  Discharge Needs  Concerns to be addressed:  Care Coordination, Discharge Planning Concerns Readmission within the last 30 days:  No Current discharge risk:  Physical Impairment Barriers to Discharge:  Continued Medical Work up, [place], LCSW 05/24/2018, 4:11 PM

## 2018-05-24 NOTE — Plan of Care (Signed)

## 2018-05-25 ENCOUNTER — Encounter: Payer: Self-pay | Admitting: Orthopedic Surgery

## 2018-05-25 ENCOUNTER — Inpatient Hospital Stay: Payer: Medicare Other

## 2018-05-25 LAB — SURGICAL PATHOLOGY

## 2018-05-25 MED ORDER — TRAMADOL HCL 50 MG PO TABS: 50.0000 mg | ORAL_TABLET | ORAL | 0 refills | Status: DC | PRN

## 2018-05-25 MED ORDER — OXYCODONE HCL 5 MG PO TABS: 5.0000 mg | ORAL_TABLET | ORAL | 0 refills | Status: DC | PRN

## 2018-05-25 NOTE — Progress Notes (Signed)
Placed right leg into bone foam and foot pumps during am report. Noted during am med pass patient stated he removed them. PT/OT working w/ pt, alerted to low BP. Dialysis notified, accepted him to Dialysis

## 2018-05-25 NOTE — Progress Notes (Signed)
Pre HD assessment    05/25/18 1016  Neurological  Level of Consciousness Alert  Orientation Level Oriented X4  Respiratory  Respiratory Pattern Regular;Unlabored  Chest Assessment Chest expansion symmetrical  Cardiac  Pulse Irregular  ECG Monitor Yes  Cardiac Rhythm NSR  Vascular  R Radial Pulse +2  L Radial Pulse +2  Edema Generalized  Integumentary  Integumentary (WDL) X  Skin Color Appropriate for ethnicity  Musculoskeletal  Musculoskeletal (WDL) X  Generalized Weakness Yes  Assistive Device None  GU Assessment  Genitourinary (WDL) X  Genitourinary Symptoms  (HD)  Psychosocial  Psychosocial (WDL) WDL

## 2018-05-25 NOTE — Clinical Social Work Placement (Signed)
   CLINICAL SOCIAL WORK PLACEMENT  NOTE  Date:  05/25/2018  Patient Details  Name: Jimmy Carson MRN: [id number] Date of Birth: 03-19-1941  Clinical Social Work is seeking post-discharge placement for this patient at the [place] level of care (*CSW will initial, date and re-position this form in  chart as items are completed):  Yes   Patient/family provided with [facility] Work Department's list of facilities offering this level of care within the geographic area requested by the patient (or if unable, by the patient's family).  Yes   Patient/family informed of their freedom to choose among providers that offer the needed level of care, that participate in Medicare, Medicaid or managed care program needed by the patient, have an available bed and are willing to accept the patient.  Yes   Patient/family informed of [facility]'s ownership interest in [facility] and [facility], as well as of the fact that they are under no obligation to receive care at these facilities.  PASRR submitted to EDS on 05/22/18     PASRR number received on 05/22/18     Existing PASRR number confirmed on       FL2 transmitted to all facilities in geographic area requested by pt/family on 05/24/18     FL2 transmitted to all facilities within larger geographic area on       Patient informed that his/her managed care company has contracts with or will negotiate with certain facilities, including the following:        Yes   Patient/family informed of bed offers received.  Patient chooses bed at ([facility] )     Physician recommends and patient chooses bed at      Patient to be transferred to   on  .  Patient to be transferred to facility by       Patient family notified on   of transfer.  Name of family member notified:        PHYSICIAN       Additional Comment:    _______________________________________________ Shonia Skilling, Veronia Beets, LCSW 05/25/2018,  12:13 PM

## 2018-05-25 NOTE — Progress Notes (Signed)
Post HD assessment. PT tolerated tx well without c/o or complication. Net UF 510, goal met.    05/25/18 1407  Vital Signs  Temp 97.8 F (36.6 C)  Temp Source Oral  Pulse Rate 81  Pulse Rate Source Monitor  Resp 17  BP 113/60  BP Location Right Arm  BP Method Automatic  Patient Position (if appropriate) Lying  Oxygen Therapy  SpO2 100 %  O2 Device Nasal Cannula  O2 Flow Rate (L/min) 2 L/min  Dialysis Weight  Weight 85.5 kg  Type of Weight Post-Dialysis  Post-Hemodialysis Assessment  Rinseback Volume (mL) 250 mL  KECN 80.3 V  Dialyzer Clearance Lightly streaked  Duration of HD Treatment -hour(s) 3.5 hour(s)  Hemodialysis Intake (mL) 500 mL  UF Total -Machine (mL) 1010 mL  Net UF (mL) 510 mL  Tolerated HD Treatment Yes  AVG/AVF Arterial Site Held (minutes) 15 minutes  AVG/AVF Venous Site Held (minutes) 15 minutes  Education / Care Plan  Dialysis Education Provided Yes  Documented Education in Care Plan Yes  Fistula / Graft Left Upper arm Arteriovenous fistula  No Placement Date or Time found.   Orientation: Left  Access Location: Upper arm  Access Type: Arteriovenous fistula  Site Condition No complications  Fistula / Graft Assessment Present;Thrill;Bruit  Status Deaccessed  Drainage Description None

## 2018-05-25 NOTE — Progress Notes (Signed)
Physical Therapy Treatment Patient Details Name: Jimmy Carson MRN: [id number] DOB: 1941-05-11 Today's Date: 05/25/2018    History of Present Illness Pt is a 77 yo male with a PMH that includes HTN, CAD, MI, cardiac stents, CABG, CHF, COPD, anemia, ESRD on HD, prostate CA, OA, and aortic valve replacement.  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    PT Comments    Pt agreeable to PT; reports 6/10 pain in R knee that increases to 7 with ambulation. Pt found sitting edge of bed without polar care and knee joint in open position (knee flexed about 60 degrees). O2 saturation low and addressed through pursed lip breathing; HR improved with O2 saturation improvement. BP 110/65, asymptomatic. Maintains at 111/58 post stand and 10 ft walk. Pt able to progress ambulation to 32 feet after brief rest. Pt educated in R knee positioning for pain/swelling management as well as QS for improving extension range. Pt also educated in use of incentive spirometer to be performed hourly. Continue PT to progress range, strength of R knee and overall mobility to improve function.    Follow Up Recommendations  SNF     Equipment Recommendations       Recommendations for Other Services       Precautions / Restrictions Precautions Precautions: Fall Precaution Comments: Watch BP Restrictions Weight Bearing Restrictions: Yes RLE Weight Bearing: Weight bearing as tolerated    Mobility  Bed Mobility               General bed mobility comments: Not tested; received sitting edge of bed  Transfers Overall transfer level: Needs assistance Equipment used: Rolling walker (2 wheeled) Transfers: Sit to/from Stand Sit to Stand: Min guard;Min assist         General transfer comment: Min guard to stand; initial sit with poor eccentric control requiring Min A; improved post instruction on second  sit  Ambulation/Gait Ambulation/Gait assistance: Min guard Gait Distance (Feet): 10 Feet(second walk 32 feet) Assistive device: Rolling walker (2 wheeled) Gait Pattern/deviations: Step-through pattern Gait velocity: decreased   General Gait Details: Mild antalgic apprearing, slow but controlled reciprocal pattern. No buckling. No reports of dizziness; BP post stand and initial ambulation 111/58   Stairs             Wheelchair Mobility    Modified Rankin (Stroke Patients Only)       Balance Overall balance assessment: Needs assistance Sitting-balance support: Bilateral upper extremity supported;Feet supported Sitting balance-Leahy Scale: Good     Standing balance support: Bilateral upper extremity supported Standing balance-Leahy Scale: Fair                              Cognition Arousal/Alertness: Awake/alert Behavior During Therapy: WFL for tasks assessed/performed Overall Cognitive Status: Within Functional Limits for tasks assessed                                        Exercises Total Joint Exercises Quad Sets: Strengthening;Both;20 reps Knee Flexion: AROM;Right;Seated;10 reps    General Comments        Pertinent Vitals/Pain Pain Assessment: 0-10 Pain Score: 6 (increased to 7 with ambulation) Pain Location: R knee     Home Living  Prior Function            PT Goals (current goals can now be found in the care plan section) Progress towards PT goals: Progressing toward goals    Frequency    BID      PT Plan Current plan remains appropriate    Co-evaluation              AM-PAC PT "6 Clicks" Mobility   Outcome Measure  Help needed turning from your back to your side while in a flat bed without using bedrails?: A Little Help needed moving from lying on your back to sitting on the side of a flat bed without using bedrails?: A Little Help needed moving to and from a bed to a  chair (including a wheelchair)?: A Little Help needed standing up from a chair using your arms (e.g., wheelchair or bedside chair)?: A Little Help needed to walk in hospital room?: A Little Help needed climbing 3-5 steps with a railing? : A Lot 6 Click Score: 17    End of Session Equipment Utilized During Treatment: Gait belt;Oxygen Activity Tolerance: Patient tolerated treatment well Patient left: in chair;with call bell/phone within reach;with chair alarm set;Other (comment)(polar care in place)   PT Visit Diagnosis: Unsteadiness on feet (R26.81);Muscle weakness (generalized) (M62.81);Difficulty in walking, not elsewhere classified (R26.2);Pain Pain - Right/Left: Right Pain - part of body: Knee     Time: 4287-6811 PT Time Calculation (min) (ACUTE ONLY): 32 min  Charges:  $Gait Training: 8-22 mins $Therapeutic Exercise: 8-22 mins                      Larae Grooms, PTA 05/25/2018, 4:51 PM

## 2018-05-25 NOTE — Progress Notes (Signed)
[facility], [place] 05/25/18  Subjective:  Patient seen and evaluated during hemodialysis. Tolerating well. Still having some pain in his right knee.  Objective:  Vital signs in last 24 hours:  Temp:  [97.7 F (36.5 C)-98.1 F (36.7 C)] 97.8 F (36.6 C) (12/02 1015) Pulse Rate:  [72-82] 73 (12/02 1145) Resp:  [15-21] 15 (12/02 1145) BP: (94-119)/(53-84) 115/80 (12/02 1145) SpO2:  [94 %-100 %] 100 % (12/02 1145) Weight:  [87.9 kg] 87.9 kg (12/02 1015)  Weight change:  Filed Weights   05/22/18 0623 05/23/18 1325 05/25/18 1015  Weight: 77.8 kg 83.1 kg 87.9 kg    Intake/Output:    Intake/Output Summary (Last 24 hours) at 05/25/2018 1158 Last data filed at 05/25/2018 0957 Gross per 24 hour  Intake 360 ml  Output 0 ml  Net 360 ml     Physical Exam: General:  Laying in the bed, no acute distress  HEENT  anicteric, moist oral mucous membranes  Neck  supple  Pulm/lungs  normal breathing effort, clear to auscultation, room air  CVS/Heart  regular no rubs  Abdomen:   Soft, nontender  Extremities:  Rt knee bandaged, no edema  Neurologic:  Alert, oriented  Skin:  No acute rashes  Access:  LUE aneurysmal AVF       Basic Metabolic Panel:  Recent Labs  Lab 05/22/18 0628 05/22/18 0638 05/24/18 0953  NA 134* 133* 135  K 4.0 3.9 4.7  CL 90*  --  93*  CO2 30  --  29  GLUCOSE 80 72 110*  BUN 47*  --  32*  CREATININE 7.50*  --  5.52*  CALCIUM 8.1*  --  8.0*     CBC: Recent Labs  Lab 05/22/18 0628 05/22/18 0638 05/24/18 0953  WBC 5.8  --  7.8  HGB 9.0* 9.5* 8.0*  HCT 30.1* 28.0* 28.1*  MCV 91.2  --  94.9  PLT 191  --  164     No results found for: HEPBSAG, HEPBSAB, HEPBIGM    Microbiology:  Recent Results (from the past 240 hour(s))  Aerobic/Anaerobic Culture (surgical/deep wound)     Status: None (Preliminary result)   Collection Time: 05/22/18  8:20 AM  Result Value Ref Range Status   Specimen Description   Final   KNEE Performed at [facility], [address] [place], [place] [address]    Special Requests   Final    RIGHT Performed at [facility], [address] [place], [place] [address]    Gram Stain   Final    RARE WBC SEEN C/MINDY HUFFINES AT 1050 05/22/18.PMF Performed at [facility], [address], [place], [place] [address]    Culture   Final    NO GROWTH 2 DAYS Performed at [facility], [place] [address], [place], [place] [address]    Report Status PENDING  Incomplete  Aerobic/Anaerobic Culture (surgical/deep wound)     Status: None (Preliminary result)   Collection Time: 05/22/18  8:28 AM  Result Value Ref Range Status   Specimen Description   Final    TISSUE Performed at [facility], [address] [place], [place] [address]    Special Requests   Final    NONE Performed at [facility], [place] [place], [place] [address]    Gram Stain   Final    RARE WBC SEEN C/MINDY HUFFINES AT 1050 05/22/18.PMF Performed at [facility], [place],  [place], [place] [address]    Culture   Final    NO GROWTH 2 DAYS NO GROWTH 5 DAYS Performed at [facility], [place] [address] [place], [place] [address]    Report Status PENDING  Incomplete  Aerobic/Anaerobic Culture (surgical/deep wound)     Status: None (Preliminary result)   Collection Time: 05/22/18  8:35 AM  Result Value Ref Range Status   Specimen Description   Final    KNEE Performed at [facility], [address], [place], [place] [address]    Special Requests   Final    NONE Performed at [facility], [place] [place], [place] [address]    Gram Stain   Final    RARE WBC SEEN C/MINDY HUFFINES AT 1050 05/22/18.PMH Performed at [facility], [address], [place], [place] [address]    Culture   Final    NO GROWTH 2 DAYS Performed at [facility], [place] [address] [place], [place] [address]     Report Status PENDING  Incomplete  Aerobic/Anaerobic Culture (surgical/deep wound)     Status: None (Preliminary result)   Collection Time: 05/22/18  8:36 AM  Result Value Ref Range Status   Specimen Description   Final    KNEE Performed at [facility], [address], [place], [place] [address]    Special Requests   Final    RIGHT Performed at [facility], [address] [place], [place] [address]    Gram Stain   Final    NO ORGANISMS SEEN RARE WBC SEEN C/MINDY HUFFINES AT 1050 05/22/18.PMF Performed at [facility], [address], [place], [place] [address]    Culture   Final    NO GROWTH 2 DAYS NO GROWTH 5 DAYS Performed at [facility], [place] [address], [place], [place] [address]    Report Status PENDING  Incomplete  Aerobic/Anaerobic Culture (surgical/deep wound)     Status: None (Preliminary result)   Collection Time: 05/22/18  8:54 AM  Result Value Ref Range Status   Specimen Description   Final    KNEE Performed at [facility], [address] [place], [place] [address]    Special Requests   Final    RIGHT Performed at [facility], [address] [place], [place] [address]    Gram Stain   Final    NO ORGANISMS SEEN RARE WBC SEEN C/MINDY HUFFINES AT 1050 05/22/18.PMF Performed at [facility], [address] [place], [place] [address]    Culture   Final    NO GROWTH 2 DAYS NO ANAEROBES ISOLATED; CULTURE IN PROGRESS FOR 5 DAYS Performed at [facility], [place] [address] [place], [place] [address]    Report Status PENDING  Incomplete  Aerobic/Anaerobic Culture (surgical/deep wound)     Status: None (Preliminary result)   Collection Time: 05/22/18  9:34 AM  Result Value Ref Range Status   Specimen Description   Final    KNEE Performed at [facility], [address], [place], [place] [address]    Special Requests   Final    NONE Performed at [facility], [address] [place], [place] [address]    Gram Stain   Final    FEW WBC SEEN C/MINDY HUFFINES AT 1050 05/22/18.PMF Performed at [facility], [address], [place], [place] [address]    Culture   Final    NO GROWTH  2 DAYS Performed at [facility], [place] [address], [place], [place] [address]    Report Status PENDING  Incomplete  Acid Fast Smear (AFB)     Status: None   Collection Time: 05/22/18  2:36 PM  Result Value Ref Range Status   AFB Specimen Processing Comment  Final    Comment: Tissue Grinding and Digestion/Decontamination   Acid Fast Smear Negative  Final    Comment: (NOTE) Performed At: [facility] [place], [place] [id number] Rush Farmer MD QI:[id number]    Source (AFB) TISSUE  Final    Coagulation Studies: No results for input(s): LABPROT, INR in the last 72 hours.  Urinalysis: No results for input(s): COLORURINE, LABSPEC, PHURINE, GLUCOSEU, HGBUR, BILIRUBINUR, KETONESUR, PROTEINUR, UROBILINOGEN, NITRITE, LEUKOCYTESUR in the last 72 hours.  Invalid input(s): APPERANCEUR    Imaging: No results found.   Medications:    . Chlorhexidine Gluconate Cloth  6 each Topical Q0600  . cinacalcet  60 mg Oral Q breakfast  . clopidogrel  75 mg Oral Daily  . epoetin (EPOGEN/PROCRIT) injection  4,000 Units Intravenous Q M,W,F-HD  . ferrous sulfate  325 mg Oral BID WC  . gabapentin  300 mg Oral QHS  . isosorbide mononitrate  60 mg Oral Daily  . pantoprazole  40 mg Oral BID  . pyridOXINE  100 mg Oral Daily  . rosuvastatin  10 mg Oral Daily  . senna-docusate  1 tablet Oral BID  . sevelamer carbonate  1,600 mg Oral With snacks  . sevelamer carbonate  2,400 mg Oral TID WC  . tamsulosin  0.4 mg Oral Daily  . traZODone  50 mg Oral QHS   acetaminophen, alum & mag hydroxide-simeth, bisacodyl, diphenhydrAMINE, HYDROmorphone (DILAUDID) injection, menthol-cetylpyridinium **OR** phenol, metoCLOPramide **OR** metoCLOPramide (REGLAN) injection,  nitroGLYCERIN, ondansetron **OR** ondansetron (ZOFRAN) IV, oxyCODONE, oxyCODONE, sodium phosphate, traMADol  Assessment/ Plan:  77 y.o. Caucasian male with ESRD, hypothyroidism, hypertension, BPH, CAD status post CABG, hyperlipidemia, right total knee revision arthroplasty May 22, 2018 Dr. Marry Guan  [facility] nephrology/MWF  1.  End-stage renal disease Patient seen and evaluated during hemodialysis tolerating well today.  2.  Anemia of chronic kidney disease emoglobin currently 8.0.  Continue Epogen with dialysis.  3.  Secondary hyperparathyroidism Continue to periodically monitor serum phosphorus.  Otherwise continue Sensipar and binders.  4.  Status post total right knee arthroplasty Plan as per orthopedics team     LOS: 3 Colum Colt 12/2/201911:58 AM  [place], [place]  Note: This note was prepared with Dragon dictation. Any transcription errors are unintentional

## 2018-05-25 NOTE — Plan of Care (Signed)
  Problem: Education: Goal: Knowledge of General Education information will improve Description Including pain rating scale, medication(s)/side effects and non-pharmacologic comfort measures Outcome: Progressing   Problem: Health Behavior/Discharge Planning: Goal: Ability to manage health-related needs will improve Outcome: Progressing   Problem: Clinical Measurements: Goal: Ability to maintain clinical measurements within normal limits will improve Outcome: Progressing Goal: Will remain free from infection Outcome: Progressing Goal: Diagnostic test results will improve Outcome: Progressing Goal: Respiratory complications will improve Outcome: Progressing Goal: Cardiovascular complication will be avoided Outcome: Progressing   Problem: Activity: Goal: Risk for activity intolerance will decrease Outcome: Progressing   Problem: Nutrition: Goal: Adequate nutrition will be maintained Outcome: Progressing   Problem: Coping: Goal: Level of anxiety will decrease Outcome: Progressing   Problem: Elimination: Goal: Will not experience complications related to bowel motility Outcome: Progressing Goal: Will not experience complications related to urinary retention Outcome: Progressing   Problem: Pain Managment: Goal: General experience of comfort will improve Outcome: Progressing   Problem: Safety: Goal: Ability to remain free from injury will improve Outcome: Progressing   Problem: Skin Integrity: Goal: Risk for impaired skin integrity will decrease Outcome: Progressing   Problem: Education: Goal: Knowledge of the prescribed therapeutic regimen will improve Outcome: Progressing Goal: Individualized Educational Video(s) Outcome: Progressing   Problem: Activity: Goal: Ability to avoid complications of mobility impairment will improve Outcome: Progressing Goal: Range of joint motion will improve Outcome: Progressing   Problem: Clinical Measurements: Goal:  Postoperative complications will be avoided or minimized Outcome: Progressing

## 2018-05-25 NOTE — Progress Notes (Signed)
HD tx start    05/25/18 1023  Vital Signs  Pulse Rate 74  Pulse Rate Source Monitor  Resp 15  BP 94/60  BP Location Right Arm  BP Method Automatic  Patient Position (if appropriate) Lying  Oxygen Therapy  SpO2 99 %  O2 Device Nasal Cannula  O2 Flow Rate (L/min) 2 L/min  During Hemodialysis Assessment  Blood Flow Rate (mL/min) 400 mL/min  Arterial Pressure (mmHg) -90 mmHg  Venous Pressure (mmHg) 160 mmHg  Transmembrane Pressure (mmHg) 60 mmHg  Ultrafiltration Rate (mL/min) 290 mL/min  Dialysate Flow Rate (mL/min) 600 ml/min  Conductivity: Machine  14.1  HD Safety Checks Performed Yes  Dialysis Fluid Bolus Normal Saline  Bolus Amount (mL) 250 mL  Intra-Hemodialysis Comments Tx initiated  Fistula / Graft Left Upper arm Arteriovenous fistula  No Placement Date or Time found.   Orientation: Left  Access Location: Upper arm  Access Type: Arteriovenous fistula  Status Accessed  Needle Size 15

## 2018-05-25 NOTE — Discharge Summary (Addendum)
Physician Discharge Summary  Patient ID: Jimmy Carson MRN: [id number] DOB/AGE: 09-15-1940 77 y.o.  Admit date: 12020/10/1517 Discharge date: 05/26/2018  Admission Diagnoses:  effusion of right knee status post total knee replacement   Discharge Diagnoses: Patient Active Problem List   Diagnosis Date Noted  . History of revision of total knee arthroplasty 12020/10/1517  . CAD S/P percutaneous coronary angioplasty 05/15/2018  . ESRD on hemodialysis ([place]) 05/15/2018  . Insomnia 05/15/2018  . Pure hypercholesterolemia 05/15/2018  . Pleural effusion on left 08/30/2017  . HTN (hypertension) 08/30/2017  . CAD (coronary artery disease) 08/30/2017  . HLD (hyperlipidemia) 08/30/2017  . 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 Pulmonary emphysema ([place]) 07/18/2016  . Other specified complication of vascular prosthetic devices, implants and grafts, initial encounter ([place]) 05/08/2016  . Complete tear of left rotator cuff 01/12/2016  . Rotator cuff arthropathy, left 01/12/2016  . Rotator cuff arthropathy, right 01/12/2016  . NSTEMI (non-ST elevated myocardial infarction) ([place]) 07/22/2015  . Chest pain 07/21/2015  . Non-rheumatic mitral regurgitation 07/18/2015  . History of prostate cancer 12/02/2014  . Lymphopenia 12/02/2014  . 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 S/P CABG x 6 02/16/2014  . Discitis 02/11/2013  . Anemia of chronic disease 12/04/2012  . Hyperparathyroidism, secondary renal ([place]) 12/04/2012  . Congenital polycystic kidney 12/03/2012  . PKD (polycystic kidney disease) 12/03/2012    Past Medical History:  Diagnosis Date  . Anemia   . CAD (coronary artery disease)    s/p percutaneous coronary angioplasty, s/p CABG x 6   . ESRD (end stage renal disease) ([place])   . Heart attack ([place])   . History of radiation therapy    for prostate CA  . Hyperlipidemia   . Hyperparathyroidism, secondary renal  ([place])   . Hypertension   . Osteoarthritis   . Polycystic kidney disease   . Prostate CA [facility]) 2012   Brachytherapy  . Renal insufficiency    currently on dialysis  . Shingles      Transfusion: No transfusions during this admission.  Patient did have routine dialysis.   Consultants (if any): Treatment Team:  Murlean Iba, MD  Discharged Condition: Improved  Hospital Course: Jimmy Carson is an 77 y.o. male who was admitted 12020/10/1517 with a diagnosis of osteolysis of right total knee arthroplasty and went to the operating room on 12020/10/1517 and underwent the above named procedures.    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 M.D.  ASSISTANT:  Vance Peper, PA (present and scrubbed throughout the case, critical for assistance with exposure, retraction, instrumentation, and closure)  ANESTHESIA: spinal and general  ESTIMATED BLOOD LOSS: 125 mL  FLUIDS REPLACED: 400 mL of crystalloid  TOURNIQUET TIME: #1 - 120 minutes         #2 - 67 minutes  DRAINS: 2 medium Hemovac drains  IMPLANTS UTILIZED: DePuy TC3 Sigma size 5 femoral component (cemented), 5 degree femoral adapter, adapter bolt, 48 mm porous-coated femoral sleeve, 75 mm x 16 mm universal fluted stem, 8 mm posteromedial augment, 4 mm posterolateral augment, 41 mm 3 peg oval dome patella (cemented), and a 10 mm stabilized rotating platform polyethylene insert.  Corticocancellus allograft bone and 10 cc of DBX putty was also utilized.  INDICATIONS FOR SURGERY: Jimmy  Carson is a 77 y.o. year old male who previously underwent a right total knee arthroplasty.  He had recently had the onset of some discomfort as well as swelling.. X-rays demonstrated  osteolytic/cystic lesions to the distal femur. The patient had not seen any significant improvement despite conservative nonsurgical intervention. After discussion of the risks and benefits of surgical intervention, the patient expressed understanding of the risks benefits and agree with plans for revision of the total knee arthroplasty.   The risks, benefits, and alternatives were discussed at length including but not limited to the risks of infection, bleeding, nerve injury, stiffness, blood clots, the need for revision surgery, cardiopulmonary complications, among others, and they were willing to proceed. Patient tolerated the surgery well. No complications .Patient was taken to PACU where she was stabilized and then transferred to the orthopedic floor.  Patient started on Plavix. Foot pumps applied bilaterally at 80 mm hgb. Heels elevated off bed with rolled towels. No evidence of DVT. Calves non tender. Negative Homan. Physical therapy started on day #1 for gait training and transfer with OT starting on  day #1 for ADL and assisted devices. Patient has done well with therapy. Ambulated 15 feet upon being discharged.  Patient's IV And Foley were discontinued on day #1 with Hemovac being discontinued on day #2. Dressing was changed on day 2 prior to patient being discharged   He was given perioperative antibiotics:  Anti-infectives (From admission, onward)   Start     Dose/Rate Route Frequency Ordered Stop   05/22/18 1530  clindamycin (CLEOCIN) IVPB 600 mg     600 mg 100 mL/hr over 30 Minutes Intravenous Every 6 hours 05/22/18 1526 05/23/18 1529   05/22/18 0600  clindamycin (CLEOCIN) IVPB 900 mg     900 mg 100 mL/hr over 30 Minutes Intravenous On call to O.R. 05/21/18 2134 05/22/18 0821   05/22/18 0546  clindamycin (CLEOCIN) 900 MG/50ML IVPB    Note to Pharmacy:  Lyman Bishop   : cabinet override      05/22/18 0546 05/22/18 0751    .  He was fitted with AV 1 compression foot pump  devices, instructed on heel pumps, early ambulation, and fitted with TED stockings bilaterally for DVT prophylaxis.  He benefited maximally from the hospital stay and there were no complications.    Recent vital signs:  Vitals:   05/24/18 1606 05/25/18 0101  BP: 109/65 97/64  Pulse: 72 81  Resp:    Temp: 97.7 F (36.5 C)   SpO2: 94% 97%    Recent laboratory studies:  Lab Results  Component Value Date   HGB 8.0 (L) 05/24/2018   HGB 9.5 (L) 102-25-2019   HGB 9.0 (L) 102-25-2019   Lab Results  Component Value Date   WBC 7.8 05/24/2018   PLT 164 05/24/2018   Lab Results  Component Value Date   INR 1.20 102-25-2019   Lab Results  Component Value Date   NA 135 05/24/2018   K 4.7 05/24/2018   CL 93 (L) 05/24/2018   CO2 29 05/24/2018   BUN 32 (H) 05/24/2018   CREATININE 5.52 (H) 05/24/2018   GLUCOSE 110 (H) 05/24/2018    Discharge Medications:   Allergies as of 05/25/2018      Reactions   Orange Fruit [citrus] Hives   Penicillins Hives, Other (See Comments)   Has patient had a PCN reaction causing immediate rash, facial/tongue/throat swelling, SOB or lightheadedness with hypotension: Yes Has patient had a PCN reaction causing severe rash  involving mucus membranes or skin necrosis: No Has patient had a PCN reaction that required hospitalization No Has patient had a PCN reaction occurring within the last 10 years: No If all of the above answers are "NO", then may proceed with Cephalosporin use.      Medication List    STOP taking these medications   ALEVE 220 MG tablet Generic drug:  naproxen sodium     TAKE these medications   acetaminophen 500 MG tablet Commonly known as:  TYLENOL Take 1,000 mg by mouth every 6 (six) hours as needed for mild pain or headache.   cinacalcet 60 MG tablet Commonly known as:  SENSIPAR Take 60 mg by mouth daily.   clopidogrel 75 MG tablet Commonly known as:  PLAVIX Take 75 mg by mouth daily.   isosorbide mononitrate 60 MG 24 hr  tablet Commonly known as:  IMDUR Take 60 mg by mouth daily.   nitroGLYCERIN 0.4 MG SL tablet Commonly known as:  NITROSTAT Place 1 tablet (0.4 mg total) under the tongue every 5 (five) minutes as needed for chest pain.   oxyCODONE 5 MG immediate release tablet Commonly known as:  Oxy IR/ROXICODONE Take 1 tablet (5 mg total) by mouth every 4 (four) hours as needed for moderate pain (pain score 4-6).   pyridOXINE 100 MG tablet Commonly known as:  VITAMIN B-6 Take 100 mg by mouth daily.   rosuvastatin 10 MG tablet Commonly known as:  CRESTOR Take 10 mg by mouth daily.   sevelamer carbonate 800 MG tablet Commonly known as:  RENVELA Take 1,600-2,400 mg by mouth See admin instructions. Take 2400 mg by mouth 3 times daily with meals and take 1600 mg by mouth with snacks   tamsulosin 0.4 MG Caps capsule Commonly known as:  FLOMAX Take 0.4 mg by mouth daily.   traMADol 50 MG tablet Commonly known as:  ULTRAM Take 1-2 tablets (50-100 mg total) by mouth every 4 (four) hours as needed for moderate pain.   traZODone 50 MG tablet Commonly known as:  DESYREL Take 50 mg by mouth at bedtime.            Durable Medical Equipment  (From admission, onward)         Start     Ordered   05/22/18 1527  DME Walker rolling  Once    Question:  Patient needs a walker to treat with the following condition  Answer:  Total knee replacement status   05/22/18 1526   05/22/18 1527  DME Bedside commode  Once    Question:  Patient needs a bedside commode to treat with the following condition  Answer:  Total knee replacement status   05/22/18 1526          Diagnostic Studies: Dg Knee Right Port  Result Date: 1Sep 27, 202019 CLINICAL DATA:  Total knee replacement EXAM: PORTABLE RIGHT KNEE - 1-2 VIEW COMPARISON:  CT 04/02/2018 FINDINGS: Changes of right total knee revision. Soft tissue gas and drains noted. No hardware bony complicating feature. IMPRESSION: Right knee replacement revision. No acute  bony abnormality or hardware complicating feature. Electronically Signed   By: Rolm Baptise M.D.   On: 1Sep 27, 202019 13:50    Disposition:    PATIENT IS TO HAVE AN MRI OF RIGHT SHOULDER ON AN OUTPATIENT BASES ON A Tuesday OR Thursday   Discharge Instructions    Diet - low sodium heart healthy   Complete by:  As directed    Increase activity slowly   Complete  by:  As directed       Follow-up Information    Watt Climes, PA On 06/05/2018.   Specialty:  Physician Assistant Why:  at 2:15pm Contact information: [place] [address] [phone]        Dereck Leep, MD On 07/02/2018.   Specialty:  Orthopedic Surgery Why:  at 10:45am Contact information: [place] [address] [phone]            Signed: Watt Climes 05/25/2018, 7:11 AM

## 2018-05-25 NOTE — Progress Notes (Signed)
HD tx end    05/25/18 1400  Vital Signs  Pulse Rate 79  Pulse Rate Source Monitor  Resp 13  BP (!) 117/59  BP Location Right Arm  BP Method Automatic  Patient Position (if appropriate) Lying  Oxygen Therapy  SpO2 100 %  O2 Device Nasal Cannula  O2 Flow Rate (L/min) 2 L/min  During Hemodialysis Assessment  Dialysis Fluid Bolus Normal Saline  Bolus Amount (mL) 250 mL  Intra-Hemodialysis Comments Tx completed

## 2018-05-25 NOTE — Progress Notes (Signed)
Physical Therapy Treatment Patient Details Name: Jimmy Carson MRN: [id number] DOB: 12-Jan-1941 Today's Date: 05/25/2018    History of Present Illness Pt is a 77 yo male with a PMH that includes HTN, CAD, MI, cardiac stents, CABG, CHF, COPD, anemia, ESRD on HD, prostate CA, OA, and aortic valve replacement.  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    PT Comments    Pt presents with deficits in strength, transfers, mobility, gait, R knee ROM, and activity tolerance.  Pt required min A for the RLE in/out of bed this session.  Pt's BP in sitting at EOB 93/60 mmHg with no adverse symptoms.  After seated and standing therex pt was able to amb around 10' with a RW with cues for sequencing with no instances of RLE buckling this session.  Upon returning to sitting pt reported no adverse symptoms other than mild R knee pain but BP was taken at 57/49 mmHg.   Pt returned to supine with nursing called. Pt asked again regarding symptoms and stated "maybe I'm a little dizzy, but just a little".   With nursing in room BP taken again in supine at 92/51 mmHg with no adverse symptoms.  Pt will benefit from PT services in a SNF setting upon discharge to safely address above deficits for decreased caregiver assistance and eventual return to PLOF.     Follow Up Recommendations  SNF;Supervision for mobility/OOB     Equipment Recommendations  Other (comment)(TBD at next venue of care)    Recommendations for Other Services       Precautions / Restrictions Precautions Precautions: Fall Precaution Comments: Watch BP Required Braces or Orthoses: Other Brace Other Brace: Pt able to perform Ind RLE SLRs without extensor lag, no KI donned. Restrictions Weight Bearing Restrictions: Yes RLE Weight Bearing: Weight bearing as tolerated    Mobility  Bed Mobility Overal bed mobility: Needs Assistance Bed Mobility: Supine to  Sit;Sit to Supine     Supine to sit: Min assist Sit to supine: Min assist   General bed mobility comments: Pt required minimial assist for RLE in/out of bed.  Transfers Overall transfer level: Needs assistance Equipment used: Rolling walker (2 wheeled) Transfers: Sit to/from Stand Sit to Stand: Min guard         General transfer comment: Min verbal cues for sequencing with pt steady without LOB or RLE buckling.    Ambulation/Gait Ambulation/Gait assistance: Min guard Gait Distance (Feet): 10 Feet Assistive device: Rolling walker (2 wheeled) Gait Pattern/deviations: Step-to pattern;Trunk flexed Gait velocity: decreased   General Gait Details: Min verbal cues for sequencing with the RW for step-to pattern for safety secondary to recent RLE buckling with amb but no buckling this session.  BP after amb in sitting 57/49 mmHg, see CI note.     Stairs             Wheelchair Mobility    Modified Rankin (Stroke Patients Only)       Balance Overall balance assessment: Needs assistance Sitting-balance support: Single extremity supported Sitting balance-Leahy Scale: Good     Standing balance support: Bilateral upper extremity supported Standing balance-Leahy Scale: Fair Standing balance comment: deferred 2/2 BP/O2 levels                            Cognition Arousal/Alertness: Awake/alert Behavior During Therapy: WFL for tasks assessed/performed Overall Cognitive Status: Within Functional Limits for  tasks assessed                                        Exercises Total Joint Exercises Ankle Circles/Pumps: AROM;Both;15 reps Quad Sets: AROM;Strengthening;15 reps;Right;10 reps Long Arc Quad: AROM;Right;10 reps;15 reps Knee Flexion: AROM;15 reps;Right;10 reps Goniometric ROM: R knee AROM: 10-91 deg Marching in Standing: AROM;Both;10 reps;Standing Other Exercises Other Exercises: HEP education review per handout Other Exercises:  Positioning education review to promote R knee ext PROM while in bed/chair with heels floated Other Exercises: Pt educated in polar care and compression sock mgt Other Exercises: Pt educated in falls prevention strategies when completing ADL tasks.  Other Exercises: Pt educated in use of AE for LB dressing tasks.    General Comments General comments (skin integrity, edema, etc.): Supine BP 81/69, 63/46, 93/56; O2 86%; EOB BP 93/56; O2 89%      Pertinent Vitals/Pain Pain Assessment: 0-10 Pain Score: 2  Pain Location: R knee  Pain Descriptors / Indicators: Sore Pain Intervention(s): Premedicated before session;Monitored during session    Home Living Family/patient expects to be discharged to:: Private residence Living Arrangements: Alone Available Help at Discharge: Family;Available PRN/intermittently Type of Home: House Home Access: Stairs to enter Entrance Stairs-Rails: None Home Layout: One level Home Equipment: Environmental consultant - 2 wheels;Cane - single point;Shower seat - built in      Prior Function Level of Independence: Independent with assistive device(s)      Comments: Mod Ind ambulation with a SPC in the community and without an AD in the home, no fall hx; Ind with ADLs; tends to eat out/not cook; able to clean dishes and take care of household chores   PT Goals (current goals can now be found in the care plan section) Acute Rehab PT Goals Patient Stated Goal: get back home Progress towards PT goals: Progressing toward goals    Frequency    BID      PT Plan Current plan remains appropriate    Co-evaluation              AM-PAC PT "6 Clicks" Mobility   Outcome Measure                   End of Session Equipment Utilized During Treatment: Gait belt Activity Tolerance: Patient tolerated treatment well Patient left: in bed;Other (comment)(Pt being taken to HD at end of session) Nurse Communication: Mobility status;Other (comment)(Pt's BP after amb) PT  Visit Diagnosis: Unsteadiness on feet (R26.81);Muscle weakness (generalized) (M62.81);Difficulty in walking, not elsewhere classified (R26.2);Pain Pain - Right/Left: Right Pain - part of body: Knee     Time: 0928-1000 PT Time Calculation (min) (ACUTE ONLY): 32 min  Charges:  $Therapeutic Exercise: 8-22 mins $Therapeutic Activity: 8-22 mins                    D. Scott Laquia Rosano PT, DPT 05/25/18, 12:15 PM

## 2018-05-25 NOTE — Progress Notes (Signed)
Clinical Education officer, museum (CSW) presented bed offers to patient's daughter Denny Peon and friend Peter Congo. They chose [facility]. Per Otila Kluver [facility] liaison she will start [facility] authorization today. Otila Kluver is aware that patient goes to dialysis MWF at [facility] on [address] at 11:25 am.  [facility], LCSW [phone]

## 2018-05-25 NOTE — Evaluation (Signed)
Occupational Therapy Evaluation Patient Details Name: Jimmy Carson MRN: [id number] DOB: 02-26-1941 Today's Date: 05/25/2018    History of Present Illness Pt is a 77 yo male with a PMH that includes HTN, CAD, MI, cardiac stents, CABG, CHF, COPD, anemia, ESRD on HD, prostate CA, OA, and aortic valve replacement.  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   Clinical Impression   Pt seen for OT evaluation this date, POD#1 from above surgery. Pt was independent in all ADLs prior to surgery, however occasionally using SPC for mobility in the community  due to R knee pain. Pt is eager to return to PLOF with less pain and improved safety and independence. Pt presented supine in bed with BP 81/69, 63/46, 93/56 and O2 at 86% on 1.5 L of oxygen. Sitting EOB, pt BP 93/56 and O2 89% with 1.5 L of O2. Pt currently demonstrates impairments in (see OT Problem List below) requiring at least minimal assist for LB dressing/bathing  while in seated position due to pain and limited AROM of R knee. Pt instructed in polar care mgt, falls prevention strategies, home/routines modifications, DME/AE for LB bathing and dressing tasks, and compression stocking mgt. Pt would benefit from skilled OT services including additional instruction in dressing techniques with or without assistive devices for dressing and bathing skills to support recall and carryover prior to discharge and ultimately to maximize safety, independence, and minimize falls risk and caregiver burden. OT recommends SNF upon discharge.    Follow Up Recommendations  SNF    Equipment Recommendations  None recommended by OT    Recommendations for Other Services       Precautions / Restrictions Precautions Precautions: Fall Precaution Comments: Watch BP Restrictions Weight Bearing Restrictions: Yes RLE Weight Bearing: Weight bearing as tolerated      Mobility Bed  Mobility Overal bed mobility: Needs Assistance Bed Mobility: Supine to Sit     Supine to sit: Min assist     General bed mobility comments: Pt required minimial assist for moving RLE off bed.   Transfers                 General transfer comment: Deferred 2/2 low BP and O2 levels. PT in room and was going to continue mobility with pt.     Balance Overall balance assessment: Needs assistance Sitting-balance support: Single extremity supported Sitting balance-Leahy Scale: Good         Standing balance comment: deferred 2/2 BP/O2 levels                           ADL either performed or assessed with clinical judgement   ADL Overall ADL's : Needs assistance/impaired                                       General ADL Comments: Pt able to eat and groom independently while sitting. Pt needs at least MIN A for LB dressing and bathing tasks.      Vision Baseline Vision/History: Wears glasses Wears Glasses: At all times Patient Visual Report: No change from baseline       Perception     Praxis      Pertinent Vitals/Pain Pain Score: 7  Pain Location: R knee  Pain Descriptors / Indicators: Constant Pain Intervention(s): Monitored during session;Limited activity  within patient's tolerance;Repositioned;Premedicated before session;Ice applied     Hand Dominance     Extremity/Trunk Assessment Upper Extremity Assessment Upper Extremity Assessment: Overall WFL for tasks assessed(4/5 grip and bi/tricep strength; 50% AROM in bilat shoulders)   Lower Extremity Assessment Lower Extremity Assessment: RLE deficits/detail RLE Deficits / Details: post op expected deficiencies       Communication Communication Communication: No difficulties   Cognition Arousal/Alertness: Awake/alert Behavior During Therapy: WFL for tasks assessed/performed Overall Cognitive Status: Within Functional Limits for tasks assessed                                      General Comments  Supine BP 81/69, 63/46, 93/56; O2 86%; EOB BP 93/56; O2 89%    Exercises Other Exercises Other Exercises: Pt educated in polar care and compression sock mgt Other Exercises: Pt educated in falls prevention strategies when completing ADL tasks.  Other Exercises: Pt educated in use of AE for LB dressing tasks.   Shoulder Instructions      Home Living Family/patient expects to be discharged to:: Private residence Living Arrangements: Alone Available Help at Discharge: Family;Available PRN/intermittently Type of Home: House Home Access: Stairs to enter [employer] of Steps: 1 step to a small landing big enough for a RW then another 1 step into the house Entrance Stairs-Rails: None Home Layout: One level     Bathroom Shower/Tub: Occupational psychologist: Standard     Home Equipment: Environmental consultant - 2 wheels;Cane - single point;Shower seat - built in          Prior Functioning/Environment Level of Independence: Independent with assistive device(s)        Comments: Mod Ind ambulation with a SPC in the community and without an AD in the home, no fall hx; Ind with ADLs; tends to eat out/not cook; able to clean dishes and take care of household chores        OT Problem List: Decreased strength;Decreased activity tolerance;Decreased knowledge of use of DME or AE;Pain;Impaired balance (sitting and/or standing);Decreased range of motion      OT Treatment/Interventions: Self-care/ADL training;Balance training;Therapeutic exercise;DME and/or AE instruction;Patient/family education;Therapeutic activities    OT Goals(Current goals can be found in the care plan section) Acute Rehab OT Goals Patient Stated Goal: get back home OT Goal Formulation: With patient Time For Goal Achievement: 06/08/18 Potential to Achieve Goals: Good ADL Goals Pt Will Perform Lower Body Dressing: with adaptive equipment;sit to/from stand;with supervision Pt  Will Transfer to Toilet: ambulating;with supervision;regular height toilet Additional ADL Goal #1: Pt will independently educate caregiver with polar care mgt including donning/doffing, wear schedule and positioning. Additional ADL Goal #2: Pt will independently educate caregiver with compression sock mgt including donning/doffing, wear schedule and positioning  OT Frequency: Min 1X/week   Barriers to D/C:            Co-evaluation              AM-PAC OT "6 Clicks" Daily Activity     Outcome Measure Help from another person eating meals?: None Help from another person taking care of personal grooming?: None Help from another person toileting, which includes using toliet, bedpan, or urinal?: A Little Help from another person bathing (including washing, rinsing, drying)?: A Little Help from another person to put on and taking off regular upper body clothing?: None Help from another person to put on and taking off  regular lower body clothing?: A Little 6 Click Score: 21   End of Session    Activity Tolerance: Patient tolerated treatment well Patient left: in bed(Pt left EOB with PT in room)  OT Visit Diagnosis: Other abnormalities of gait and mobility (R26.89);Pain Pain - Right/Left: Right Pain - part of body: Knee                Time: 2575-0518 OT Time Calculation (min): 34 min Charges:     Jadene Pierini OTS  Luna Fuse Solymar Grace 05/25/2018, 11:22 AM

## 2018-05-25 NOTE — Progress Notes (Signed)
Pre HD assessment    05/25/18 1015  Vital Signs  Temp 97.8 F (36.6 C)  Temp Source Oral  Pulse Rate 78  Pulse Rate Source Monitor  Resp 17  BP (!) 99/53  BP Location Right Arm  BP Method Automatic  Patient Position (if appropriate) Lying  Oxygen Therapy  SpO2 100 %  O2 Device Nasal Cannula  O2 Flow Rate (L/min) 2 L/min  Pain Assessment  Pain Scale 0-10  Pain Score 0  Dialysis Weight  Weight 87.9 kg  Type of Weight Pre-Dialysis  Time-Out for Hemodialysis  What Procedure? HD  Pt Identifiers(min of two) First/Last Name;MRN/Account#  Correct Site? Yes  Correct Side? Yes  Correct Procedure? Yes  Consents Verified? Yes  Rad Studies Available? N/A  Safety Precautions Reviewed? Yes  Research scientist (physical sciences)  (1A)  Station Number 1  UF/Alarm Test Passed  Conductivity: Meter 14  Conductivity: Machine  14.1  pH 7.4  Reverse Osmosis main  Normal Saline Lot Number [id number]  Dialyzer Lot Number [id number]  Disposable Set Lot Number [id number]  Machine Temperature 98.6 F (37 C)  Musician and Audible Yes  Blood Lines Intact and Secured Yes  Pre Treatment Patient Checks  Vascular access used during treatment Fistula  Hepatitis B Surface Antigen Results Negative  Date Hepatitis B Surface Antigen Drawn 04/29/18  Hepatitis B Surface Antibody  (<10)  Date Hepatitis B Surface Antibody Drawn 02/25/18  Hemodialysis Consent Verified Yes  Hemodialysis Standing Orders Initiated Yes  ECG (Telemetry) Monitor On Yes  Prime Ordered Normal Saline  Length of  DialysisTreatment -hour(s) 3.5 Hour(s)  Dialyzer Elisio 17H NR  Dialysate 3K, 2.5 Ca  Dialysis Anticoagulant None  Dialysate Flow Ordered 600  Blood Flow Rate Ordered 400 mL/min  Ultrafiltration Goal 0.5 Liters  Dialysis Blood Pressure Support Ordered Normal Saline  Education / Care Plan  Dialysis Education Provided Yes  Documented Education in Care Plan Yes  Fistula / Graft Left Upper arm Arteriovenous fistula   No Placement Date or Time found.   Orientation: Left  Access Location: Upper arm  Access Type: Arteriovenous fistula  Site Condition No complications  Fistula / Graft Assessment Present;Thrill;Bruit  Drainage Description None

## 2018-05-25 NOTE — Care Management Important Message (Signed)
Important Message  Patient Details  Name: Jimmy Carson MRN: [id number] Date of Birth: 31-Dec-1940   Medicare Important Message Given:  Yes    Juliann Pulse A Aneliz Carbary 05/25/2018, 11:32 AM

## 2018-05-25 NOTE — Progress Notes (Signed)
Post HD assessment    05/25/18 1406  Neurological  Level of Consciousness Responds to Voice  Orientation Level Oriented X4  Respiratory  Respiratory Pattern Regular;Unlabored  Chest Assessment Chest expansion symmetrical  Cardiac  Pulse Irregular  ECG Monitor Yes  Cardiac Rhythm NSR  Vascular  R Radial Pulse +2  L Radial Pulse +2  Edema Generalized  Integumentary  Integumentary (WDL) X  Skin Color Appropriate for ethnicity  Musculoskeletal  Musculoskeletal (WDL) X  Generalized Weakness Yes  Assistive Device None  GU Assessment  Genitourinary (WDL) X  Genitourinary Symptoms  (HD)  Psychosocial  Psychosocial (WDL) WDL

## 2018-05-25 NOTE — Progress Notes (Signed)
   Subjective: 3 Days Post-Op Procedure(s) (LRB): TOTAL KNEE REVISION (Right) Patient reports pain as mild.  Resting well.  No complaints Polar Care not working this morning.  Was unplugged.   Patient is well, and has had no acute complaints or problems Continue physical therapy.  Has 92 degrees of flexion.  Lacks about 10 degrees full extension.  Plan is to go Rehab after hospital stay. no nausea and no vomiting Patient denies any chest pains or shortness of breath. Objective: Vital signs in last 24 hours: Temp:  [97.7 F (36.5 C)] 97.7 F (36.5 C) (12/01 1606) Pulse Rate:  [72-81] 81 (12/02 0101) BP: (97-109)/(64-67) 97/64 (12/02 0101) SpO2:  [94 %-98 %] 97 % (12/02 0101) well approximated incision Heels are non tender and elevated off the bed using rolled towels Knees in flexed position.  No bone foam. Polar Care not working.  Unplugged TED stockings not on right leg Intake/Output from previous day: 12/01 0701 - 12/02 0700 In: 240 [P.O.:240] Out: 0  Intake/Output this shift: Total I/O In: 240 [P.O.:240] Out: 0   Recent Labs    05/24/18 0953  HGB 8.0*   Recent Labs    05/24/18 0953  WBC 7.8  RBC 2.96*  HCT 28.1*  PLT 164   Recent Labs    05/24/18 0953  NA 135  K 4.7  CL 93*  CO2 29  BUN 32*  CREATININE 5.52*  GLUCOSE 110*  CALCIUM 8.0*   No results for input(s): LABPT, INR in the last 72 hours.  EXAM General - Patient is Alert, Appropriate and Oriented Extremity - Neurologically intact Neurovascular intact Sensation intact distally Intact pulses distally Dorsiflexion/Plantar flexion intact No cellulitis present Compartment soft Dressing - dressing C/D/I Motor Function - intact, moving foot and toes well on exam.    Past Medical History:  Diagnosis Date  . Anemia   . CAD (coronary artery disease)    s/p percutaneous coronary angioplasty, s/p CABG x 6   . ESRD (end stage renal disease) ([place])   . Heart attack ([place])   . History of radiation  therapy    for prostate CA  . Hyperlipidemia   . Hyperparathyroidism, secondary renal ([place])   . Hypertension   . Osteoarthritis   . Polycystic kidney disease   . Prostate CA [facility]) 2012   Brachytherapy  . Renal insufficiency    currently on dialysis  . Shingles     Assessment/Plan: 3 Days Post-Op Procedure(s) (LRB): TOTAL KNEE REVISION (Right) Active Problems:   History of revision of total knee arthroplasty  Estimated body mass index is 26.29 kg/m as calculated from the following:   Height as of this encounter: 5\' 10"  (1.778 m).   Weight as of this encounter: 83.1 kg. Up with therapy Discharge to SNF when bed available  Labs: No labs from today as of this dictation DVT Prophylaxis - Foot Pumps, TED hose and Plavix Weight-Bearing as tolerated to right leg Please change dressing today Be sure bone foam is under operative heel TED stockings bilaterally per orders  Braelon Sprung R. [place] 05/25/2018, 6:57 AM

## 2018-05-26 ENCOUNTER — Inpatient Hospital Stay: Payer: Medicare Other

## 2018-05-26 LAB — AEROBIC/ANAEROBIC CULTURE W GRAM STAIN (SURGICAL/DEEP WOUND): Culture: NO GROWTH

## 2018-05-26 LAB — FUNGAL STAIN REFLEX

## 2018-05-26 LAB — FUNGUS STAIN

## 2018-05-26 NOTE — Discharge Planning (Signed)
Patient IV removed.  RN assessment and VS revealed stability for DC to facility.  Report called to [facility] and s/w [employer], LPN. Informed of suggested FU appts and appts made. Told of Outpatient MRI needed and RN agreed to contact Dr. Clydell Hakim office to confirm date/time of needed appt.  Verbal confirmation given from Dr. Rogers Blocker that patient is ok to discharge.  Printed, signed pain scripts x2 placed in packet.  EMS contacted to transport to room 604. Waiting on arrival.

## 2018-05-26 NOTE — Progress Notes (Signed)
Patient is medically stable for D/C to [facility] today. Per [facility] authorization has been received and patient can come today to room 604. RN will call report and arrange EMS for transport. Clinical Education officer, museum (CSW) sent D/C orders to [facility] via [facility]. Patient is aware of above. CSW contacted patient's daughter Denny Peon and made her aware of above. Patient's friend Peter Congo is also aware of above. CSW left [place] dialysis coordinator at voicemail making her aware of above. Please reconsult if future social work needs arise. CSW signing off.   [employer], LCSW [phone]

## 2018-05-26 NOTE — Progress Notes (Signed)
Physical Therapy Treatment Patient Details Name: Jimmy Carson MRN: [id number] DOB: 1940/10/12 Today's Date: 05/26/2018    History of Present Illness Pt is a 77 yo male with a PMH that includes HTN, CAD, MI, cardiac stents, CABG, CHF, COPD, anemia, ESRD on HD, prostate CA, OA, and aortic valve replacement.  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    PT Comments    Participated in exercises as described below.  49' and 20' with walker and min guard, mod assist needed when R knee buckles without warning.  Pt is able to complete SLR without assist x 10 but R knee buckling remains a concern during gait.  He may benefit from a KI if it continues for pt safety.    Follow Up Recommendations  SNF     Equipment Recommendations       Recommendations for Other Services       Precautions / Restrictions Precautions Precautions: Fall Precaution Comments: Watch BP, R knee buckles Other Brace: Pt able to perform Ind RLE SLRs without extensor lag, no KI donned. Restrictions Weight Bearing Restrictions: Yes RLE Weight Bearing: Weight bearing as tolerated Other Position/Activity Restrictions: Pt may benefit from [place] if knee keeps buckling during gait for safety and fall prevention    Mobility  Bed Mobility Overal bed mobility: Needs Assistance Bed Mobility: Supine to Sit     Supine to sit: Min guard        Transfers Overall transfer level: Needs assistance Equipment used: Rolling walker (2 wheeled) Transfers: Sit to/from Stand Sit to Stand: Min assist         General transfer comment: Min guard to stand; initial sit with poor eccentric control requiring Min A; improved post instruction on second sit  Ambulation/Gait Ambulation/Gait assistance: Min guard;Mod assist Gait Distance (Feet): 40 Feet Assistive device: Rolling walker (2 wheeled) Gait Pattern/deviations: Step-to pattern;Step-through  pattern Gait velocity: decreased   General Gait Details: irregular pattern.  73' then 20' R knee buckled each time promting seated rest.   Stairs             Wheelchair Mobility    Modified Rankin (Stroke Patients Only)       Balance Overall balance assessment: Needs assistance Sitting-balance support: Bilateral upper extremity supported;Feet supported Sitting balance-Leahy Scale: Good     Standing balance support: Bilateral upper extremity supported Standing balance-Leahy Scale: Fair                              Cognition Arousal/Alertness: Awake/alert Behavior During Therapy: WFL for tasks assessed/performed Overall Cognitive Status: Within Functional Limits for tasks assessed                                        Exercises Total Joint Exercises Ankle Circles/Pumps: AROM;Both;15 reps Quad Sets: Strengthening;Both;20 reps Heel Slides: AROM;15 reps;AAROM;Both Straight Leg Raises: AROM;Both;10 reps Long Arc Quad: AROM;Right;10 reps;15 reps Knee Flexion: AROM;Right;Seated;10 reps Goniometric ROM: 3-95 Marching in Standing: AROM;Both;10 reps;Seated    General Comments        Pertinent Vitals/Pain Pain Assessment: 0-10 Pain Score: 3  Pain Location: R knee  Pain Descriptors / Indicators: Sore Pain Intervention(s): Limited activity within patient's tolerance;Monitored during session    Home Living  Prior Function            PT Goals (current goals can now be found in the care plan section) Progress towards PT goals: Progressing toward goals    Frequency    BID      PT Plan Current plan remains appropriate    Co-evaluation              AM-PAC PT "6 Clicks" Mobility   Outcome Measure  Help needed turning from your back to your side while in a flat bed without using bedrails?: A Little Help needed moving from lying on your back to sitting on the side of a flat bed without using  bedrails?: A Little Help needed moving to and from a bed to a chair (including a wheelchair)?: A Little Help needed standing up from a chair using your arms (e.g., wheelchair or bedside chair)?: A Little Help needed to walk in hospital room?: A Little Help needed climbing 3-5 steps with a railing? : A Lot 6 Click Score: 17    End of Session Equipment Utilized During Treatment: Gait belt Activity Tolerance: Patient tolerated treatment well Patient left: in chair;with call bell/phone within reach;with chair alarm set;Other (comment)   Pain - Right/Left: Right Pain - part of body: Knee     Time: 8978-4784 PT Time Calculation (min) (ACUTE ONLY): 16 min  Charges:  $Gait Training: 8-22 mins                     Chesley Noon, PTA 05/26/18, 10:51 AM

## 2018-05-26 NOTE — Progress Notes (Addendum)
Subjective: 4 Days Post-Op Procedure(s) (LRB): TOTAL KNEE REVISION (Right) Patient reports pain as mild.   Patient is well, but has had some minor complaints of Right shoulder pain that he has been experiencing prior to his admission.  States is not so much pain but was more concerned about the discoloration of the shoulder. Patient progressing very slowly with therapy.  Ambulated only 15 feet yesterday. Plan is to go Rehab after hospital stay. no nausea and no vomiting Patient denies any chest pains or shortness of breath. Objective: Vital signs in last 24 hours: Temp:  [97.8 F (36.6 C)-98.3 F (36.8 C)] 98.3 F (36.8 C) (12/02 2352) Pulse Rate:  [72-85] 85 (12/02 2352) Resp:  [13-29] 18 (12/02 2352) BP: (94-120)/(49-84) 112/49 (12/02 2352) SpO2:  [90 %-100 %] 98 % (12/02 2352) Weight:  [85.5 kg-87.9 kg] 85.5 kg (12/02 1407) well approximated incision Heels are non tender and elevated off the bed using rolled towels Intake/Output from previous day: 12/02 0701 - 12/03 0700 In: 360 [P.O.:360] Out: 510  Intake/Output this shift: No intake/output data recorded.  Recent Labs    05/24/18 0953  HGB 8.0*   Recent Labs    05/24/18 0953  WBC 7.8  RBC 2.96*  HCT 28.1*  PLT 164   Recent Labs    05/24/18 0953  NA 135  K 4.7  CL 93*  CO2 29  BUN 32*  CREATININE 5.52*  GLUCOSE 110*  CALCIUM 8.0*   No results for input(s): LABPT, INR in the last 72 hours.  EXAM General - Patient is Alert, Appropriate and Oriented Extremity - Neurologically intact Neurovascular intact Sensation intact distally Intact pulses distally Dorsiflexion/Plantar flexion intact No cellulitis present Compartment soft  Patient is noted to have some discoloration to the anterior right shoulder which appeared to be residual bruising with some possible residual hematoma.  Minimal tenderness noted to palpation.  Patient is moving right shoulder on his own.  He is noted to have significant  restriction range of motion to the shoulder but did not appear to be necessarily secondary to pain Dressing - dressing C/D/I Motor Function - intact, moving foot and toes well on exam.    Past Medical History:  Diagnosis Date  . Anemia   . CAD (coronary artery disease)    s/p percutaneous coronary angioplasty, s/p CABG x 6   . ESRD (end stage renal disease) ([place])   . Heart attack ([place])   . History of radiation therapy    for prostate CA  . Hyperlipidemia   . Hyperparathyroidism, secondary renal ([place])   . Hypertension   . Osteoarthritis   . Polycystic kidney disease   . Prostate CA [facility]) 2012   Brachytherapy  . Renal insufficiency    currently on dialysis  . Shingles     Assessment/Plan: 4 Days Post-Op Procedure(s) (LRB): TOTAL KNEE REVISION (Right) Active Problems:   History of revision of total knee arthroplasty  Estimated body mass index is 27.05 kg/m as calculated from the following:   Height as of this encounter: 5\' 10"  (1.778 m).   Weight as of this encounter: 85.5 kg. Up with therapy Discharge to SNF  Labs: None DVT Prophylaxis - Foot Pumps, TED hose and Plavix Weight-Bearing as tolerated to right leg Patient may be discharged to rehab today. Please change dressing prior to being discharged We will get an x-ray of the right shoulder today prior to being discharged  Luian Schumpert R. [place] 05/26/2018, 7:39 AM

## 2018-05-26 NOTE — Progress Notes (Signed)
Clinical Education officer, museum (CSW) made [facility] liaison aware to follow up with Dr. Clydell Hakim office about outpatient MRI appointment.   [employer], LCSW [phone]

## 2018-05-26 NOTE — Clinical Social Work Placement (Signed)
   CLINICAL SOCIAL WORK PLACEMENT  NOTE  Date:  05/26/2018  Patient Details  Name: Sebastion Jun MRN: [id number] Date of Birth: 1941-01-27  Clinical Social Work is seeking post-discharge placement for this patient at the [place] level of care (*CSW will initial, date and re-position this form in  chart as items are completed):  Yes   Patient/family provided with [place] Work Department's list of facilities offering this level of care within the geographic area requested by the patient (or if unable, by the patient's family).  Yes   Patient/family informed of their freedom to choose among providers that offer the needed level of care, that participate in Medicare, Medicaid or managed care program needed by the patient, have an available bed and are willing to accept the patient.  Yes   Patient/family informed of [facility]'s ownership interest in [facility] and [facility], as well as of the fact that they are under no obligation to receive care at these facilities.  PASRR submitted to EDS on 05/22/18     PASRR number received on 05/22/18     Existing PASRR number confirmed on       FL2 transmitted to all facilities in geographic area requested by pt/family on 05/24/18     FL2 transmitted to all facilities within larger geographic area on       Patient informed that his/her managed care company has contracts with or will negotiate with certain facilities, including the following:        Yes   Patient/family informed of bed offers received.  [facility] ([facility] )     Physician recommends and patient chooses bed at      Patient to be transferred to ([facility] ) on 05/26/18.  Patient to be transferred to facility by [facility] )     Patient family notified on 05/26/18 of transfer.  Name of family member notified:  (Patient's daughter Denny Peon is aware of D/C today. )     PHYSICIAN       Additional Comment:     _______________________________________________ Kolt Mcwhirter, Veronia Beets, LCSW 05/26/2018, 9:49 AM

## 2018-05-26 NOTE — Progress Notes (Signed)
[place], [place] 05/26/18  Subjective:  Patient completed hemodialysis yesterday. Working with physical therapy today.  Objective:  Vital signs in last 24 hours:  Temp:  [97.8 F (36.6 C)-98.3 F (36.8 C)] 97.9 F (36.6 C) (12/03 0745) Pulse Rate:  [74-85] 80 (12/03 0745) Resp:  [13-19] 18 (12/02 2352) BP: (99-120)/(49-82) 99/60 (12/03 0745) SpO2:  [90 %-100 %] 99 % (12/03 0745) Weight:  [85.5 kg] 85.5 kg (12/02 1407)  Weight change:  Filed Weights   05/23/18 1325 05/25/18 1015 05/25/18 1407  Weight: 83.1 kg 87.9 kg 85.5 kg    Intake/Output:    Intake/Output Summary (Last 24 hours) at 05/26/2018 1223 Last data filed at 05/26/2018 0900 Gross per 24 hour  Intake 600 ml  Output 510 ml  Net 90 ml     Physical Exam: General:  Laying in the bed, no acute distress  HEENT  anicteric, moist oral mucous membranes  Neck  supple  Pulm/lungs  normal breathing effort, clear to auscultation, room air  CVS/Heart  regular no rubs  Abdomen:   Soft, nontender  Extremities:  Rt knee bandaged, no edema  Neurologic:  Alert, oriented  Skin:  No acute rashes  Access:  LUE aneurysmal AVF       Basic Metabolic Panel:  Recent Labs  Lab 05/22/18 0628 05/22/18 0638 05/24/18 0953  NA 134* 133* 135  K 4.0 3.9 4.7  CL 90*  --  93*  CO2 30  --  29  GLUCOSE 80 72 110*  BUN 47*  --  32*  CREATININE 7.50*  --  5.52*  CALCIUM 8.1*  --  8.0*     CBC: Recent Labs  Lab 05/22/18 0628 05/22/18 0638 05/24/18 0953  WBC 5.8  --  7.8  HGB 9.0* 9.5* 8.0*  HCT 30.1* 28.0* 28.1*  MCV 91.2  --  94.9  PLT 191  --  164     No results found for: HEPBSAG, HEPBSAB, HEPBIGM    Microbiology:  Recent Results (from the past 240 hour(s))  Aerobic/Anaerobic Culture (surgical/deep wound)     Status: None (Preliminary result)   Collection Time: 05/22/18  8:20 AM  Result Value Ref Range Status   Specimen Description   Final    KNEE Performed at [facility], [address] [place], [place] [address]    Special Requests   Final    RIGHT Performed at [facility], [address] [place], [place] [address]    Gram Stain   Final    RARE WBC SEEN C/MINDY HUFFINES AT 1050 05/22/18.PMF Performed at [facility], [address], [place], [place] [address]    Culture   Final    NO GROWTH 4 DAYS NO ANAEROBES ISOLATED; CULTURE IN PROGRESS FOR 5 DAYS Performed at [facility], [place] [address], [place], [place] [address]    Report Status PENDING  Incomplete  Aerobic/Anaerobic Culture (surgical/deep wound)     Status: None (Preliminary result)   Collection Time: 05/22/18  8:28 AM  Result Value Ref Range Status   Specimen Description   Final    TISSUE Performed at [facility], [address] [place], [place] [address]    Special Requests   Final    NONE Performed at [facility], [address] [place], [place] [address]    Gram Stain   Final    RARE WBC SEEN C/MINDY HUFFINES AT 1050 05/22/18.PMF Performed at [facility],  [place], [place] [address]    Culture   Final    NO GROWTH 4 DAYS Performed at [facility], [place] [address] [place], [place] [address]    Report Status PENDING  Incomplete  Aerobic/Anaerobic Culture (surgical/deep wound)     Status: None (Preliminary result)   Collection Time: 05/22/18  8:35 AM  Result Value Ref Range Status   Specimen Description   Final    KNEE Performed at [facility], [address] [place], [place] [address]    Special Requests   Final    NONE Performed at [facility], [address] [place], [place] [address]    Gram Stain   Final    RARE WBC SEEN C/MINDY HUFFINES AT 1050 05/22/18.PMH Performed at [facility], [address], [place], [place] [address]    Culture   Final    NO GROWTH 4 DAYS NO ANAEROBES ISOLATED; CULTURE IN PROGRESS FOR 5 DAYS Performed at [place] [facility] [address] [place], [place] [address]    Report Status PENDING  Incomplete  Aerobic/Anaerobic Culture (surgical/deep wound)     Status: None (Preliminary result)   Collection Time: 05/22/18  8:36 AM  Result Value Ref Range Status   Specimen Description   Final    KNEE Performed at [facility], [address], [place], [place] [address]    Special Requests   Final    RIGHT Performed at [facility], [address] [place], [place] [address]    Gram Stain   Final    NO ORGANISMS SEEN RARE WBC SEEN C/MINDY HUFFINES AT 1050 05/22/18.PMF Performed at [facility], [address], [place], [place] [address]    Culture   Final    NO GROWTH 4 DAYS Performed at [facility], [place] [address] [place], [place] [address]    Report Status PENDING  Incomplete  Aerobic/Anaerobic Culture (surgical/deep wound)     Status: None   Collection Time: 05/22/18  8:54 AM  Result Value Ref Range Status   Specimen Description   Final    KNEE Performed at [facility], [address] [place], [place] [address]    Special Requests   Final    RIGHT Performed at [facility], [address] [place], [place] [address]    Gram Stain   Final    NO ORGANISMS SEEN RARE WBC SEEN C/MINDY HUFFINES AT 1050 05/22/18.PMF Performed at [facility], [address], [place], [place] [address]    Culture   Final    No growth aerobically or anaerobically. Performed at [facility], [place] [address], [place], [place] [address]    Report Status 05/26/2018 FINAL  Final  Fungus Stain     Status: None   Collection Time: 05/22/18  9:34 AM  Result Value Ref Range Status   FUNGUS STAIN Final report  Final    Comment: (NOTE) Performed At: [facility] [address] [place], [place] [id number] Rush Farmer MD MA:[id number]    Fungal Source KNEE  Final    Comment: Performed at [facility], [address] [place], [place] [address]  Aerobic/Anaerobic Culture (surgical/deep wound)     Status: None (Preliminary result)   Collection Time: 05/22/18  9:34 AM  Result Value Ref Range Status   Specimen Description   Final    KNEE Performed at [facility], [address] [place], [place] [address]    Special Requests   Final    NONE Performed at [facility], [address] [place] [address] [place], [place] [address]    Gram Stain   Final    FEW WBC SEEN C/MINDY HUFFINES AT 1050 05/22/18.PMF Performed at [facility], [address] [place], [place] [address]    Culture   Final    NO GROWTH 4 DAYS NO ANAEROBES ISOLATED; CULTURE IN PROGRESS FOR 5 DAYS Performed at [facility], [place] [address], [place], [place] [address]    Report Status PENDING  Incomplete  Fungal Stain reflex     Status: None   Collection Time: 05/22/18  9:34 AM  Result Value Ref Range Status   Fungal stain result 1 Comment  Final    Comment: (NOTE) KOH/Calcofluor preparation:  no fungus observed. Performed At: [facility] [place], [place] [id number] Rush Farmer MD OI:[id number]   Acid Fast Smear (AFB)     Status: None   Collection Time: 05/22/18  2:36 PM  Result Value Ref Range Status   AFB Specimen Processing Comment  Final    Comment: Tissue Grinding and Digestion/Decontamination   Acid Fast Smear Negative  Final    Comment: (NOTE) Performed At: [facility] [place], [place] [id number] Rush Farmer MD QH:[id number]    Source (AFB) TISSUE  Final    Coagulation Studies: No results for input(s): LABPROT, INR in the last 72 hours.  Urinalysis: No results for input(s): COLORURINE, LABSPEC, PHURINE, GLUCOSEU, HGBUR, BILIRUBINUR, KETONESUR, PROTEINUR, UROBILINOGEN, NITRITE, LEUKOCYTESUR in the last 72 hours.  Invalid input(s): APPERANCEUR    Imaging: Dg Chest 1 View  Result Date: 05/25/2018 CLINICAL DATA:  Patient with cough for 2  days. EXAM: CHEST  1 VIEW COMPARISON:  Chest radiograph 04/06/2018 FINDINGS: Patient is rotated to the left. Stable cardiomegaly. Diffuse bilateral interstitial pulmonary opacities. Moderate left pleural effusion with underlying consolidation. IMPRESSION: Moderate left pleural effusion and underlying opacities which may represent atelectasis or infection. Pulmonary vascular redistribution and mild interstitial edema. Enlarged pulmonary arteries. Electronically Signed   By: Lovey Newcomer M.D.   On: 05/25/2018 20:50     Medications:    . Chlorhexidine Gluconate Cloth  6 each Topical Q0600  . cinacalcet  60 mg Oral Q breakfast  . clopidogrel  75 mg Oral Daily  . epoetin (EPOGEN/PROCRIT) injection  4,000 Units Intravenous Q M,W,F-HD  . ferrous sulfate  325 mg Oral BID WC  . gabapentin  300 mg Oral QHS  . isosorbide mononitrate  60 mg Oral Daily  . pantoprazole  40 mg Oral BID  . pyridOXINE  100 mg Oral Daily  . rosuvastatin  10 mg Oral Daily  . senna-docusate  1 tablet Oral BID  . sevelamer carbonate  1,600 mg Oral With snacks  . sevelamer carbonate  2,400 mg Oral TID WC  . tamsulosin  0.4 mg Oral Daily  . traZODone  50 mg Oral QHS   acetaminophen, alum & mag hydroxide-simeth, bisacodyl, diphenhydrAMINE, HYDROmorphone (DILAUDID) injection, menthol-cetylpyridinium **OR** phenol, metoCLOPramide **OR** metoCLOPramide (REGLAN) injection, nitroGLYCERIN, ondansetron **OR** ondansetron (ZOFRAN) IV, oxyCODONE, oxyCODONE, sodium phosphate, traMADol  Assessment/ Plan:  77 y.o. Caucasian male with ESRD, hypothyroidism, hypertension, BPH, CAD status post CABG, hyperlipidemia, right total knee revision arthroplasty May 22, 2018 Dr. Marry Guan  [facility] nephrology/MWF  1.  End-stage renal disease Patient completed hemodialysis yesterday.  Next dialysis tomorrow as an outpatient.  2.  Anemia of chronic  kidney disease Continue erythropoietin stimulating agents as an outpatient.  3.   Secondary hyperparathyroidism Continue Renvela and Sensipar as an outpatient.  4.  Status post total right knee arthroplasty Plan as per orthopedics team, pt being discharged to facility with PT.      LOS: 4 Swati Granberry 12/3/201912:23 PM  [place], [place]  Note: This note was prepared with Dragon dictation. Any transcription errors are unintentional

## 2018-05-27 ENCOUNTER — Other Ambulatory Visit: Payer: Self-pay | Admitting: Physician Assistant

## 2018-05-27 DIAGNOSIS — G8929 Other chronic pain: Secondary | ICD-10-CM

## 2018-05-27 DIAGNOSIS — M25511 Pain in right shoulder: Principal | ICD-10-CM

## 2018-05-27 LAB — AEROBIC/ANAEROBIC CULTURE W GRAM STAIN (SURGICAL/DEEP WOUND)
Culture: NO GROWTH
Culture: NO GROWTH
Culture: NO GROWTH
Culture: NO GROWTH
Culture: NO GROWTH
Gram Stain: NONE SEEN
Gram Stain: NONE SEEN
Gram Stain: NONE SEEN
Gram Stain: NONE SEEN

## 2018-05-27 LAB — AEROBIC/ANAEROBIC CULTURE (SURGICAL/DEEP WOUND)

## 2018-05-29 ENCOUNTER — Encounter: Payer: Self-pay | Admitting: Orthopedic Surgery

## 2018-06-03 ENCOUNTER — Observation Stay
Admission: EM | Admit: 2018-06-03 | Discharge: 2018-06-06 | Disposition: A | Discharge status: Skilled Nursing Facility | Payer: Medicare Other | Attending: Internal Medicine | Admitting: Internal Medicine

## 2018-06-03 ENCOUNTER — Emergency Department: Payer: Medicare Other

## 2018-06-03 ENCOUNTER — Other Ambulatory Visit: Payer: Self-pay

## 2018-06-03 DIAGNOSIS — J439 Emphysema, unspecified: Secondary | ICD-10-CM | POA: Diagnosis not present

## 2018-06-03 DIAGNOSIS — R410 Disorientation, unspecified: Secondary | ICD-10-CM | POA: Insufficient documentation

## 2018-06-03 DIAGNOSIS — I272 Pulmonary hypertension, unspecified: Secondary | ICD-10-CM | POA: Diagnosis not present

## 2018-06-03 DIAGNOSIS — R7989 Other specified abnormal findings of blood chemistry: Secondary | ICD-10-CM | POA: Insufficient documentation

## 2018-06-03 DIAGNOSIS — E876 Hypokalemia: Secondary | ICD-10-CM | POA: Diagnosis not present

## 2018-06-03 DIAGNOSIS — Z7902 Long term (current) use of antithrombotics/antiplatelets: Secondary | ICD-10-CM | POA: Insufficient documentation

## 2018-06-03 DIAGNOSIS — D631 Anemia in chronic kidney disease: Secondary | ICD-10-CM | POA: Insufficient documentation

## 2018-06-03 DIAGNOSIS — E7800 Pure hypercholesterolemia, unspecified: Secondary | ICD-10-CM | POA: Diagnosis not present

## 2018-06-03 DIAGNOSIS — I120 Hypertensive chronic kidney disease with stage 5 chronic kidney disease or end stage renal disease: Secondary | ICD-10-CM | POA: Diagnosis not present

## 2018-06-03 DIAGNOSIS — Z96653 Presence of artificial knee joint, bilateral: Secondary | ICD-10-CM | POA: Diagnosis not present

## 2018-06-03 DIAGNOSIS — Z952 Presence of prosthetic heart valve: Secondary | ICD-10-CM | POA: Diagnosis not present

## 2018-06-03 DIAGNOSIS — Z87891 Personal history of nicotine dependence: Secondary | ICD-10-CM | POA: Insufficient documentation

## 2018-06-03 DIAGNOSIS — I2510 Atherosclerotic heart disease of native coronary artery without angina pectoris: Secondary | ICD-10-CM | POA: Diagnosis present

## 2018-06-03 DIAGNOSIS — N400 Enlarged prostate without lower urinary tract symptoms: Secondary | ICD-10-CM | POA: Insufficient documentation

## 2018-06-03 DIAGNOSIS — R079 Chest pain, unspecified: Principal | ICD-10-CM | POA: Insufficient documentation

## 2018-06-03 DIAGNOSIS — E8770 Fluid overload, unspecified: Secondary | ICD-10-CM | POA: Insufficient documentation

## 2018-06-03 DIAGNOSIS — N186 End stage renal disease: Secondary | ICD-10-CM

## 2018-06-03 DIAGNOSIS — R531 Weakness: Secondary | ICD-10-CM | POA: Insufficient documentation

## 2018-06-03 DIAGNOSIS — Z923 Personal history of irradiation: Secondary | ICD-10-CM | POA: Insufficient documentation

## 2018-06-03 DIAGNOSIS — G9341 Metabolic encephalopathy: Secondary | ICD-10-CM | POA: Diagnosis not present

## 2018-06-03 DIAGNOSIS — Z951 Presence of aortocoronary bypass graft: Secondary | ICD-10-CM | POA: Diagnosis not present

## 2018-06-03 DIAGNOSIS — N2581 Secondary hyperparathyroidism of renal origin: Secondary | ICD-10-CM | POA: Diagnosis not present

## 2018-06-03 DIAGNOSIS — I10 Essential (primary) hypertension: Secondary | ICD-10-CM | POA: Diagnosis present

## 2018-06-03 DIAGNOSIS — E785 Hyperlipidemia, unspecified: Secondary | ICD-10-CM | POA: Diagnosis not present

## 2018-06-03 DIAGNOSIS — Z8546 Personal history of malignant neoplasm of prostate: Secondary | ICD-10-CM | POA: Diagnosis not present

## 2018-06-03 DIAGNOSIS — I252 Old myocardial infarction: Secondary | ICD-10-CM | POA: Diagnosis not present

## 2018-06-03 DIAGNOSIS — Z992 Dependence on renal dialysis: Secondary | ICD-10-CM | POA: Diagnosis not present

## 2018-06-03 DIAGNOSIS — I2699 Other pulmonary embolism without acute cor pulmonale: Secondary | ICD-10-CM

## 2018-06-03 DIAGNOSIS — Z79899 Other long term (current) drug therapy: Secondary | ICD-10-CM | POA: Insufficient documentation

## 2018-06-03 LAB — COMPREHENSIVE METABOLIC PANEL
ALK PHOS: 90 U/L (ref 38–126)
ALT: 24 U/L (ref 0–44)
AST: 45 U/L — ABNORMAL HIGH (ref 15–41)
Albumin: 3 g/dL — ABNORMAL LOW (ref 3.5–5.0)
Anion gap: 12 (ref 5–15)
BUN: 19 mg/dL (ref 8–23)
CO2: 31 mmol/L (ref 22–32)
Calcium: 8 mg/dL — ABNORMAL LOW (ref 8.9–10.3)
Chloride: 92 mmol/L — ABNORMAL LOW (ref 98–111)
Creatinine, Ser: 3.49 mg/dL — ABNORMAL HIGH (ref 0.61–1.24)
GFR calc Af Amer: 19 mL/min — ABNORMAL LOW (ref >60)
GFR calc non Af Amer: 16 mL/min — ABNORMAL LOW (ref >60)
Glucose, Bld: 101 mg/dL — ABNORMAL HIGH (ref 70–99)
Potassium: 3.3 mmol/L — ABNORMAL LOW (ref 3.5–5.1)
Sodium: 135 mmol/L (ref 135–145)
Total Bilirubin: 0.9 mg/dL (ref 0.3–1.2)
Total Protein: 6.4 g/dL — ABNORMAL LOW (ref 6.5–8.1)

## 2018-06-03 LAB — CBC
HCT: 26.1 % — ABNORMAL LOW (ref 39.0–52.0)
Hemoglobin: 7.8 g/dL — ABNORMAL LOW (ref 13.0–17.0)
MCH: 27 pg (ref 26.0–34.0)
MCHC: 29.9 g/dL — ABNORMAL LOW (ref 30.0–36.0)
MCV: 90.3 fL (ref 80.0–100.0)
NRBC: 0 % (ref 0.0–0.2)
Platelets: 183 10*3/uL (ref 150–400)
RBC: 2.89 MIL/uL — ABNORMAL LOW (ref 4.22–5.81)
RDW: 18.5 % — ABNORMAL HIGH (ref 11.5–15.5)
WBC: 7.6 10*3/uL (ref 4.0–10.5)

## 2018-06-03 LAB — TROPONIN I: Troponin I: 0.34 ng/mL (ref <0.03)

## 2018-06-03 MED ORDER — IOHEXOL 350 MG/ML SOLN
75.0000 mL | Freq: Once | INTRAVENOUS | Status: AC | PRN
  Administered 2018-06-03: 75 mL via INTRAVENOUS

## 2018-06-03 NOTE — ED Notes (Signed)
Date and time results received: 06/03/18 2145 (use smartphrase ".now" to insert current time)  Test: Troponin Critical Value: 0.34  Name of Provider Notified: Dr. Kerman Passey  Orders Received? Or Actions Taken?: ED provider aware

## 2018-06-03 NOTE — ED Triage Notes (Signed)
Patient arrives from [facility]. Patient arrives with c/o altered mental status and chest pressure. Nurse at facility gave 1 nitro, 324 ASA. Family states patient had fall 2 days prior and has not been acting himself. +Agitated. Pinpoint pupils.  Hx dialysis MWF. Right leg swelling from right knee revision 10 days ago. , fistula left arm. 18g right AC.

## 2018-06-03 NOTE — H&P (Signed)
[facility] NAME: Jimmy Carson    MR#:  [id number]  DATE OF BIRTH:  11/01/1940  DATE OF ADMISSION:  06/03/2018  PRIMARY CARE PHYSICIAN: Dion Body, MD   REQUESTING/REFERRING PHYSICIAN: Kerman Passey, MD  CHIEF COMPLAINT:   Chief Complaint  Patient presents with  . Altered Mental Status  . Chest Pain    HISTORY OF PRESENT ILLNESS:  Jimmy Carson  is a 77 y.o. male who presents with chief complaint as above.  Patient presents to the ED from [facility] with a complaint of chest discomfort.  He had a fall a couple of days ago, and then afterwards developed some chest discomfort over the past couple of days.  He had some nausea with vomiting per his report.  He is unable to give a clear history on HPI, and his wife provides the majority of the sequence of events.  He states that his discomfort has not felt like his prior heart attacks, and that it is not pressure-like, but he cannot clarify or characterize his discomfort any further than that.  He is currently not feeling this discomfort.  Work-up in the ED shows elevated troponin, though this is in the setting of end-stage renal disease on dialysis.  Patient has significant prior cardiac history with multiple bypass surgeries and aortic valve replacement.  Hospitalist were called for further evaluation and treatment.  Of note, patient has also been having more difficulty communicating his thoughts, and this is also something new over the past couple of days.  He notes no focal neurologic deficit other than this difficulty in being able to communicate what he wants to say.  However, he denies any outright expressive aphasia.  PAST MEDICAL HISTORY:   Past Medical History:  Diagnosis Date  . Anemia   . CAD (coronary artery disease)    s/p percutaneous coronary angioplasty, s/p CABG x 6   . ESRD (end stage renal disease) ([place])   . Heart attack ([place])   .  History of radiation therapy    for prostate CA  . Hyperlipidemia   . Hyperparathyroidism, secondary renal ([place])   . Hypertension   . Osteoarthritis   . Polycystic kidney disease   . Prostate CA [facility]) 2012   Brachytherapy  . Renal insufficiency    currently on dialysis  . Shingles      PAST SURGICAL HISTORY:   Past Surgical History:  Procedure Laterality Date  . AORTIC VALVE REPLACEMENT    . CARDIAC CATHETERIZATION N/A 07/24/2015   Procedure: Coronary/Graft Angiography;  Surgeon: Corey Skains, MD;  Location: [place] [facility];  Service: Cardiovascular;  Laterality: N/A;  . CARDIAC CATHETERIZATION N/A 06/03/2016   Procedure: Left Heart Cath and Coronary Angiography;  Surgeon: Isaias Cowman, MD;  Location: [place] [facility];  Service: Cardiovascular;  Laterality: N/A;  . CORONARY ARTERY BYPASS GRAFT    . HERNIA REPAIR    . JOINT REPLACEMENT    . REPLACEMENT TOTAL KNEE BILATERAL    . ROTATOR CUFF REPAIR Right   . TOTAL KNEE REVISION Right 107-02-202019   Procedure: TOTAL KNEE REVISION;  Surgeon: Dereck Leep, MD;  Location: [facility] OR[facility];  Service: Orthopedics;  Laterality: Right;     SOCIAL HISTORY:   Social History   Tobacco Use  . Smoking status: Former Smoker    Last attempt to quit: 04/19/2013    Years since quitting: 5.1  . Smokeless tobacco: Never Used  Substance Use Topics  .  Alcohol use: No     FAMILY HISTORY:   Family History  Problem Relation Age of Onset  . Polycystic kidney disease Father   . Aneurysm Father      DRUG ALLERGIES:   Allergies  Allergen Reactions  . Orange Fruit [Citrus] Hives  . Penicillins Hives and Other (See Comments)    Has patient had a PCN reaction causing immediate rash, facial/tongue/throat swelling, SOB or lightheadedness with hypotension: Yes Has patient had a PCN reaction causing severe rash involving mucus membranes or skin necrosis: No Has patient had a PCN reaction that required hospitalization No Has  patient had a PCN reaction occurring within the last 10 years: No If all of the above answers are "NO", then may proceed with Cephalosporin use.     MEDICATIONS AT HOME:   Prior to Admission medications   Medication Sig Start Date End Date Taking? Authorizing Provider  acetaminophen (TYLENOL) 500 MG tablet Take 1,000 mg by mouth every 6 (six) hours as needed for mild pain or headache.    Yes [provider]  cinacalcet (SENSIPAR) 60 MG tablet Take 60 mg by mouth daily.    Yes [provider]  clopidogrel (PLAVIX) 75 MG tablet Take 75 mg by mouth daily.   Yes [provider]  isosorbide mononitrate (IMDUR) 60 MG 24 hr tablet Take 60 mg by mouth daily.   Yes [provider]  nitroGLYCERIN (NITROSTAT) 0.4 MG SL tablet Place 1 tablet (0.4 mg total) under the tongue every 5 (five) minutes as needed for chest pain. 06/03/16  Yes Max Sane, MD  pyridOXINE (VITAMIN B-6) 100 MG tablet Take 100 mg by mouth daily.   Yes [provider]  rosuvastatin (CRESTOR) 10 MG tablet Take 10 mg by mouth daily.   Yes [provider]  sevelamer carbonate (RENVELA) 800 MG tablet Take 1,600-2,400 mg by mouth See admin instructions. Take 2400 mg by mouth 3 times daily with meals and take 1600 mg by mouth with snacks   Yes [provider]  tamsulosin (FLOMAX) 0.4 MG CAPS capsule Take 0.4 mg by mouth daily.    Yes [provider]  traMADol (ULTRAM) 50 MG tablet Take 1-2 tablets (50-100 mg total) by mouth every 4 (four) hours as needed for moderate pain. 05/25/18  Yes Watt Climes, PA  traZODone (DESYREL) 50 MG tablet Take 50 mg by mouth at bedtime.   Yes [provider]  oxyCODONE (OXY IR/ROXICODONE) 5 MG immediate release tablet Take 1 tablet (5 mg total) by mouth every 4 (four) hours as needed for moderate pain (pain score 4-6). Patient not taking: Reported on 06/03/2018 05/25/18   Watt Climes, PA    REVIEW OF SYSTEMS:  Review of Systems   Constitutional: Negative for chills, fever, malaise/fatigue and weight loss.  HENT: Negative for ear pain, hearing loss and tinnitus.   Eyes: Negative for blurred vision, double vision, pain and redness.  Respiratory: Negative for cough, hemoptysis and shortness of breath.   Cardiovascular: Positive for chest pain. Negative for palpitations, orthopnea and leg swelling.  Gastrointestinal: Negative for abdominal pain, constipation, diarrhea, nausea and vomiting.  Genitourinary: Negative for dysuria, frequency and hematuria.  Musculoskeletal: Negative for back pain, joint pain and neck pain.  Skin:       No acne, rash, or lesions  Neurological: Negative for dizziness, tremors, focal weakness and weakness.  Endo/Heme/Allergies: Negative for polydipsia. Does not bruise/bleed easily.  Psychiatric/Behavioral: Negative for depression. The patient is not nervous/anxious and  does not have insomnia.      VITAL SIGNS:   Vitals:   06/03/18 2035 06/03/18 2223  BP: 104/62 (!) 111/57  Pulse: 76 75  Resp: 16 17  Temp: 98.7 F (37.1 C)   TempSrc: Oral   SpO2: 98% 98%   Wt Readings from Last 3 Encounters:  05/25/18 85.5 kg  05/15/18 77.8 kg  05/07/18 77.8 kg    PHYSICAL EXAMINATION:  Physical Exam  Vitals reviewed. Constitutional: He is oriented to person, place, and time. He appears well-developed and well-nourished. No distress.  HENT:  Head: Normocephalic and atraumatic.  Mouth/Throat: Oropharynx is clear and moist.  Eyes: Pupils are equal, round, and reactive to light. Conjunctivae and EOM are normal. No scleral icterus.  Neck: Normal range of motion. Neck supple. No JVD present. No thyromegaly present.  Cardiovascular: Normal rate, regular rhythm and intact distal pulses. Exam reveals no gallop and no friction rub.  Murmur heard. Respiratory: Effort normal and breath sounds normal. No respiratory distress. He has no wheezes. He has no rales.  GI: Soft. Bowel sounds are normal. He  exhibits no distension. There is no tenderness.  Musculoskeletal: Normal range of motion. He exhibits no edema.  No arthritis, no gout  Lymphadenopathy:    He has no cervical adenopathy.  Neurological: He is alert and oriented to person, place, and time. No cranial nerve deficit.  No dysarthria, no aphasia  Skin: Skin is warm and dry. No rash noted. No erythema.  Psychiatric: He has a normal mood and affect. His behavior is normal. Judgment and thought content normal.    LABORATORY PANEL:   CBC Recent Labs  Lab 06/03/18 2113  WBC 7.6  HGB 7.8*  HCT 26.1*  PLT 183   ------------------------------------------------------------------------------------------------------------------  Chemistries  Recent Labs  Lab 06/03/18 2113  NA 135  K 3.3*  CL 92*  CO2 31  GLUCOSE 101*  BUN 19  CREATININE 3.49*  CALCIUM 8.0*  AST 45*  ALT 24  ALKPHOS 90  BILITOT 0.9   ------------------------------------------------------------------------------------------------------------------  Cardiac Enzymes Recent Labs  Lab 06/03/18 2113  TROPONINI 0.34*   ------------------------------------------------------------------------------------------------------------------  RADIOLOGY:  Dg Chest 2 View  Result Date: 06/03/2018 CLINICAL DATA:  Altered mental status and chest pressure. EXAM: CHEST - 2 VIEW COMPARISON:  05/25/2018; 04/06/2018; 08/29/2017; Apr 03, 202019 FINDINGS: Grossly unchanged cardiac silhouette and mediastinal contours with nodular prominence of the bilateral pulmonary hila. Post median sternotomy. Atherosclerotic plaque within thoracic aorta. Post aortic valve replacement. Unchanged small potentially partially loculated left-sided pleural effusion with associated left basilar consolidative opacities. No new focal airspace opacities. No evidence of edema. No pneumothorax. No acute osseus abnormalities. Post right-sided rotator cuff repair. IMPRESSION: 1. Stable examination without  definitive superimposed acute cardiopulmonary disease. 2. Similar findings of chronic small left-sided effusion and associated left basilar opacities, likely atelectasis or scar. Electronically Signed   By: Sandi Mariscal M.D.   On: 06/03/2018 21:34   Ct Head Wo Contrast  Result Date: 06/03/2018 CLINICAL DATA:  Altered mental status history of fall 2 days ago EXAM: CT HEAD WITHOUT CONTRAST TECHNIQUE: Contiguous axial images were obtained from the base of the skull through the vertex without intravenous contrast. COMPARISON:  MRI 07/24/2017, CT brain 04/24/2016 FINDINGS: Brain: No acute territorial infarction, hemorrhage or intracranial mass. Mild motion degradation. Moderate atrophy. Mild small vessel ischemic changes of the white matter Vascular: No hyperdense vessels.  Carotid vascular calcification. Skull: Normal. Negative for fracture or focal lesion. Sinuses/Orbits: No acute finding. Other: None IMPRESSION: 1. No  CT evidence for acute intracranial abnormality. 2. Atrophy and mild small vessel ischemic changes of the white matter Electronically Signed   By: Donavan Foil M.D.   On: 06/03/2018 21:33    EKG:   Orders placed or performed during the hospital encounter of 06/03/18  . EKG 12-Lead  . EKG 12-Lead    IMPRESSION AND PLAN:  Principal Problem:   Chest pain -we are obtaining CTA chest to evaluate for the possibility of pulmonary embolism versus some other pathology to explain his chest pain.  We will trend his cardiac enzymes tonight, get an echocardiogram and a cardiology consult in the morning Active Problems:   Confusion -patient is oriented, though he has a difficult time describing in full detail his symptoms or his history of present illness.  He does not have any focal neurologic deficit, but he does have some difficulty communicating his thoughts clearly.  Cardiac work-up initially is indicated given his chest pain symptoms, though neurologic work-up may also need to be evaluated if  the symptoms and does not improve.  We will check a urinalysis upfront for any possibility of UTI, as the patient does state that he still makes some urine on a daily basis.   HTN (hypertension) -home dose antihypertensives   CAD (coronary artery disease) -continue home meds, other work-up as above   ESRD on hemodialysis [facility]) -nephrology consult for dialysis support   HLD (hyperlipidemia) -Home dose antilipid  Chart review performed and case discussed with ED provider. Labs, imaging and/or ECG reviewed by provider and discussed with patient/family. Management plans discussed with the patient and/or family.  DVT PROPHYLAXIS: SubQ lovenox   GI PROPHYLAXIS:  None  ADMISSION STATUS: Inpatient     CODE STATUS: Full Code Status History    Date Active Date Inactive Code Status Order ID Comments User Context   111-13-202019 1526 05/26/2018 2139 Full Code [id number]  Dereck Leep, MD Inpatient   08/30/2017 0207 08/31/2017 1417 Full Code [id number]  Lance Coon, MD Inpatient   05/31/2016 2036 06/04/2016 0053 Full Code [id number]  Henreitta Leber, MD ED   07/22/2015 0002 07/24/2015 2225 Full Code [id number]  Harrie Foreman, MD ED      TOTAL TIME TAKING CARE OF THIS PATIENT: 45 minutes.   Jannifer Franklin, Shantaya [place] 06/03/2018, 11:20 PM  [facility]  Office  [phone]  CC: Primary care physician; Dion Body, MD  Note:  This document was prepared using Dragon voice recognition software and may include unintentional dictation errors.

## 2018-06-03 NOTE — ED Provider Notes (Signed)
[facility] Emergency Department Provider Note  Time seen: 8:47 PM  I have reviewed the triage vital signs and the nursing notes.   HISTORY  Chief Complaint Altered Mental Status and Chest Pain    HPI Jimmy Carson is a 77 y.o. male with a past medical history of anemia, end-stage renal disease on hemodialysis, hypertension, recent right knee revision currently at [facility] presents to the emergency department for intermittent chest pressure, intermittent agitation and confusion.  According EMS report patient has been at [facility] for the past 10+ days ever since his right knee replacement revision.  Patient is on hemodialysis and did go to dialysis today.  They state over the past 2 days patient has had issues with agitation at times and sometimes confusion.  Per EMS report family states that this is abnormal for the patient.  Currently the patient is awake alert he is oriented x4 and in no distress.  Patient denies any fever recent cough or congestion abdominal pain.  States he was having some mild chest pressure earlier received 1 nitroglycerin and denies any chest pain or pressure at this time.  Denies any shortness of breath at any point.  States mild bilateral shoulder pain which is chronic for him per patient.  Mild right knee pain which again is chronic since his surgery.  Overall the patient appears well, no distress.  EMS reports normal vitals.   Past Medical History:  Diagnosis Date  . Anemia   . CAD (coronary artery disease)    s/p percutaneous coronary angioplasty, s/p CABG x 6   . ESRD (end stage renal disease) ([place])   . Heart attack ([place])   . History of radiation therapy    for prostate CA  . Hyperlipidemia   . Hyperparathyroidism, secondary renal ([place])   . Hypertension   . Osteoarthritis   . Polycystic kidney disease   . Prostate CA [facility]) 2012   Brachytherapy  . Renal insufficiency    currently on dialysis  .  Shingles     Patient Active Problem List   Diagnosis Date Noted  . History of revision of total knee arthroplasty 1Jun 13, 202019  . CAD S/P percutaneous coronary angioplasty 05/15/2018  . ESRD on hemodialysis ([place]) 05/15/2018  . Insomnia 05/15/2018  . Pure hypercholesterolemia 05/15/2018  . Pleural effusion on left 08/30/2017  . HTN (hypertension) 08/30/2017  . CAD (coronary artery disease) 08/30/2017  . HLD (hyperlipidemia) 08/30/2017  . 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 Pulmonary emphysema ([place]) 07/18/2016  . Other specified complication of vascular prosthetic devices, implants and grafts, initial encounter ([place]) 05/08/2016  . Complete tear of left rotator cuff 01/12/2016  . Rotator cuff arthropathy, left 01/12/2016  . Rotator cuff arthropathy, right 01/12/2016  . NSTEMI (non-ST elevated myocardial infarction) ([place]) 07/22/2015  . Chest pain 07/21/2015  . Non-rheumatic mitral regurgitation 07/18/2015  . History of prostate cancer 12/02/2014  . Lymphopenia 12/02/2014  . 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 S/P CABG x 6 02/16/2014  . Discitis 02/11/2013  . Anemia of chronic disease 12/04/2012  . Hyperparathyroidism, secondary renal ([place]) 12/04/2012  . Congenital polycystic kidney 12/03/2012  . PKD (polycystic kidney disease) 12/03/2012    Past Surgical History:  Procedure Laterality Date  . AORTIC VALVE REPLACEMENT    . CARDIAC CATHETERIZATION N/A 07/24/2015   Procedure: Coronary/Graft Angiography;  Surgeon: Corey Skains, MD;  Location: [place] [facility];  Service:  Cardiovascular;  Laterality: N/A;  . CARDIAC CATHETERIZATION N/A 06/03/2016   Procedure: Left Heart Cath and Coronary Angiography;  Surgeon: Isaias Cowman, MD;  Location: [place] [facility];  Service: Cardiovascular;  Laterality: N/A;  . CORONARY ARTERY BYPASS GRAFT    . HERNIA REPAIR    . JOINT REPLACEMENT    . REPLACEMENT  TOTAL KNEE BILATERAL    . ROTATOR CUFF REPAIR Right   . TOTAL KNEE REVISION Right 110-Jan-202019   Procedure: TOTAL KNEE REVISION;  Surgeon: Dereck Leep, MD;  Location: [facility] ORS;  Service: Orthopedics;  Laterality: Right;    Prior to Admission medications   Medication Sig Start Date End Date Taking? Authorizing Provider  acetaminophen (TYLENOL) 500 MG tablet Take 1,000 mg by mouth every 6 (six) hours as needed for mild pain or headache.     [provider]  cinacalcet (SENSIPAR) 60 MG tablet Take 60 mg by mouth daily.     [provider]  clopidogrel (PLAVIX) 75 MG tablet Take 75 mg by mouth daily.    [provider]  isosorbide mononitrate (IMDUR) 60 MG 24 hr tablet Take 60 mg by mouth daily.    [provider]  nitroGLYCERIN (NITROSTAT) 0.4 MG SL tablet Place 1 tablet (0.4 mg total) under the tongue every 5 (five) minutes as needed for chest pain. 06/03/16   Max Sane, MD  oxyCODONE (OXY IR/ROXICODONE) 5 MG immediate release tablet Take 1 tablet (5 mg total) by mouth every 4 (four) hours as needed for moderate pain (pain score 4-6). 05/25/18   Watt Climes, PA  pyridOXINE (VITAMIN B-6) 100 MG tablet Take 100 mg by mouth daily.    [provider]  rosuvastatin (CRESTOR) 10 MG tablet Take 10 mg by mouth daily.    [provider]  sevelamer carbonate (RENVELA) 800 MG tablet Take 1,600-2,400 mg by mouth See admin instructions. Take 2400 mg by mouth 3 times daily with meals and take 1600 mg by mouth with snacks    [provider]  tamsulosin (FLOMAX) 0.4 MG CAPS capsule Take 0.4 mg by mouth daily.     [provider]  traMADol (ULTRAM) 50 MG tablet Take 1-2 tablets (50-100 mg total) by mouth every 4 (four) hours as needed for moderate pain. 05/25/18   Watt Climes, PA  traZODone (DESYREL) 50 MG tablet Take 50 mg by mouth at bedtime.    [provider]    Allergies  Allergen Reactions  . Orange Fruit [Citrus] Hives   . Penicillins Hives and Other (See Comments)    Has patient had a PCN reaction causing immediate rash, facial/tongue/throat swelling, SOB or lightheadedness with hypotension: Yes Has patient had a PCN reaction causing severe rash involving mucus membranes or skin necrosis: No Has patient had a PCN reaction that required hospitalization No Has patient had a PCN reaction occurring within the last 10 years: No If all of the above answers are "NO", then may proceed with Cephalosporin use.     Family History  Problem Relation Age of Onset  . Polycystic kidney disease Father   . Aneurysm Father     Social History Social History   Tobacco Use  . Smoking status: Former Smoker    Last attempt to quit: 04/19/2013    Years since quitting: 5.1  . Smokeless tobacco: Never Used  Substance Use Topics  . Alcohol use: No  . Drug use: No    Review of Systems Constitutional: Negative for fever. Cardiovascular:  Negative for chest pain. Respiratory: Negative for shortness of breath. Gastrointestinal: Negative for abdominal pain, vomiting  Genitourinary: Negative for urinary compaints Musculoskeletal: Negative for musculoskeletal complaints Skin: Negative for skin complaints  Neurological: Negative for headache All other ROS negative  ____________________________________________   PHYSICAL EXAM:  VITAL SIGNS: ED Triage Vitals  Enc Vitals Group     BP 06/03/18 2035 104/62     Pulse Rate 06/03/18 2035 76     Resp 06/03/18 2035 16     Temp 06/03/18 2035 98.7 F (37.1 C)     Temp Source 06/03/18 2035 Oral     SpO2 06/03/18 2035 98 %     Weight --      Height --      Head Circumference --      [facility] --      Pain Score 06/03/18 2043 0     Pain Loc --      Pain Edu? --      Excl. in [place]? --    Constitutional: Alert and oriented. Well appearing and in no distress. Eyes: Normal exam ENT   Head: Normocephalic and atraumatic.   Mouth/Throat: Mucous membranes are  moist. Cardiovascular: Normal rate, regular rhythm.  Respiratory: Normal respiratory effort without tachypnea nor retractions. Breath sounds are clear  Gastrointestinal: Soft and nontender. No distention. Musculoskeletal: Patient has incision to right knee, well-appearing with no signs of abscess or drainage.  No dehiscence, staples in place. Neurologic:  Normal speech and language. No gross focal neurologic deficits, somewhat limited in right lower extremity due to recent surgery Skin:  Skin is warm, dry and intact.  Psychiatric: Mood and affect are normal. Speech and behavior are normal.   ____________________________________________    EKG  EKG reviewed and interpreted by myself shows a normal sinus rhythm 80 bpm with a widened QRS, largely normal axis, QTC prolongation, nonspecific ST changes most consistent with left bundle branch block.  ____________________________________________    RADIOLOGY  CT head negative. Chest x-ray shows stable findings no acute abnormality.  ____________________________________________   INITIAL IMPRESSION / ASSESSMENT AND PLAN / ED COURSE  Pertinent labs & imaging results that were available during my care of the patient were reviewed by me and considered in my medical decision making (see chart for details).  Patient presents to the emergency department from his nursing facility with agitation/confusion, intermittent chest pressure.  He also states 2 days ago patient fell not sure if he hit his head as it was unwitnessed.  Given the patient's mental status changes per report we will obtain labs including cardiac enzymes and a chest x-ray.  Given his recent fall we will obtain CT imaging of the head.  Patient states he does not make urine on a daily basis.  Overall the patient currently appears very well oriented x4.  I reviewed the patient's medical administration record looks like he was taking oxycodone it is not clear if he is still taking this  medication, tramadol is also prescribed again is not entirely clear if he is currently taking these medications.  Patient is also prescribed trazodone.  These medications could possibly cause the patient's agitation/confusion.  Patient's work-up shows an elevated troponin 0 0.34, baseline troponin appears to be normal despite being on dialysis.  Given the patient's intermittent chest pain with recent knee replacement surgery I believe the patient requires CT imaging of the chest to rule out pulmonary embolism.  Regardless patient will need to be admitted given chest pain and an  elevated troponin.  Hospitalist will be admitting under their service.  ____________________________________________   FINAL CLINICAL IMPRESSION(S) / ED DIAGNOSES  Chest pain Elevated troponin    Harvest Dark, MD 06/03/18 2229

## 2018-06-04 ENCOUNTER — Inpatient Hospital Stay
Admit: 2018-06-04 | Discharge: 2018-06-04 | Disposition: A | Discharge status: Home / Self Care | Payer: Medicare Other | Attending: Internal Medicine | Admitting: Internal Medicine

## 2018-06-04 ENCOUNTER — Other Ambulatory Visit: Payer: Self-pay

## 2018-06-04 DIAGNOSIS — R079 Chest pain, unspecified: Secondary | ICD-10-CM | POA: Diagnosis not present

## 2018-06-04 LAB — CBC
HCT: 25.3 % — ABNORMAL LOW (ref 39.0–52.0)
HCT: 26.9 % — ABNORMAL LOW (ref 39.0–52.0)
Hemoglobin: 7.6 g/dL — ABNORMAL LOW (ref 13.0–17.0)
Hemoglobin: 8 g/dL — ABNORMAL LOW (ref 13.0–17.0)
MCH: 27.1 pg (ref 26.0–34.0)
MCH: 27.3 pg (ref 26.0–34.0)
MCHC: 30 g/dL (ref 30.0–36.0)
MCHC: 29.7 g/dL — ABNORMAL LOW (ref 30.0–36.0)
MCV: 90.4 fL (ref 80.0–100.0)
MCV: 91.8 fL (ref 80.0–100.0)
Platelets: 180 10*3/uL (ref 150–400)
Platelets: 189 10*3/uL (ref 150–400)
RBC: 2.8 MIL/uL — ABNORMAL LOW (ref 4.22–5.81)
RBC: 2.93 MIL/uL — ABNORMAL LOW (ref 4.22–5.81)
RDW: 18.6 % — ABNORMAL HIGH (ref 11.5–15.5)
RDW: 18.7 % — ABNORMAL HIGH (ref 11.5–15.5)
WBC: 6.2 10*3/uL (ref 4.0–10.5)
WBC: 6.7 10*3/uL (ref 4.0–10.5)
nRBC: 0 % (ref 0.0–0.2)
nRBC: 0 % (ref 0.0–0.2)

## 2018-06-04 LAB — RENAL FUNCTION PANEL
ANION GAP: 13 (ref 5–15)
Albumin: 2.6 g/dL — ABNORMAL LOW (ref 3.5–5.0)
BUN: 25 mg/dL — AB (ref 8–23)
CO2: 30 mmol/L (ref 22–32)
Calcium: 8 mg/dL — ABNORMAL LOW (ref 8.9–10.3)
Chloride: 91 mmol/L — ABNORMAL LOW (ref 98–111)
Creatinine, Ser: 4.39 mg/dL — ABNORMAL HIGH (ref 0.61–1.24)
GFR calc Af Amer: 14 mL/min — ABNORMAL LOW (ref >60)
GFR, EST NON AFRICAN AMERICAN: 12 mL/min — AB (ref >60)
Glucose, Bld: 80 mg/dL (ref 70–99)
Phosphorus: 3.8 mg/dL (ref 2.5–4.6)
Potassium: 3.3 mmol/L — ABNORMAL LOW (ref 3.5–5.1)
Sodium: 134 mmol/L — ABNORMAL LOW (ref 135–145)

## 2018-06-04 LAB — MRSA PCR SCREENING: MRSA by PCR: NEGATIVE

## 2018-06-04 LAB — DIFFERENTIAL
Abs Immature Granulocytes: 0.02 10*3/uL (ref 0.00–0.07)
Basophils Absolute: 0 10*3/uL (ref 0.0–0.1)
Basophils Relative: 1 %
Eosinophils Absolute: 0.2 10*3/uL (ref 0.0–0.5)
Eosinophils Relative: 3 %
Immature Granulocytes: 0 %
LYMPHS PCT: 6 %
Lymphs Abs: 0.4 10*3/uL — ABNORMAL LOW (ref 0.7–4.0)
Monocytes Absolute: 0.8 10*3/uL (ref 0.1–1.0)
Monocytes Relative: 11 %
Neutro Abs: 5.3 10*3/uL (ref 1.7–7.7)
Neutrophils Relative %: 79 %

## 2018-06-04 LAB — BASIC METABOLIC PANEL
ANION GAP: 11 (ref 5–15)
BUN: 21 mg/dL (ref 8–23)
CALCIUM: 7.9 mg/dL — AB (ref 8.9–10.3)
CO2: 31 mmol/L (ref 22–32)
Chloride: 94 mmol/L — ABNORMAL LOW (ref 98–111)
Creatinine, Ser: 3.93 mg/dL — ABNORMAL HIGH (ref 0.61–1.24)
GFR calc non Af Amer: 14 mL/min — ABNORMAL LOW (ref >60)
GFR, EST AFRICAN AMERICAN: 16 mL/min — AB (ref >60)
Glucose, Bld: 82 mg/dL (ref 70–99)
Potassium: 3.8 mmol/L (ref 3.5–5.1)
Sodium: 136 mmol/L (ref 135–145)

## 2018-06-04 LAB — TROPONIN I
Troponin I: 0.31 ng/mL (ref <0.03)
Troponin I: 0.32 ng/mL (ref <0.03)
Troponin I: 0.28 ng/mL (ref <0.03)

## 2018-06-04 LAB — ECHOCARDIOGRAM COMPLETE
Height: 70 in
Weight: 2715.2 oz

## 2018-06-04 MED ORDER — SEVELAMER CARBONATE 800 MG PO TABS
2400.0000 mg | ORAL_TABLET | Freq: Three times a day (TID) | ORAL | Status: DC
  Administered 2018-06-04 – 2018-06-06 (×5): 2400 mg via ORAL
  Filled 2018-06-04 (×5): qty 3

## 2018-06-04 MED ORDER — DIPHENHYDRAMINE HCL 50 MG/ML IJ SOLN
12.5000 mg | Freq: Once | INTRAMUSCULAR | Status: AC
  Administered 2018-06-04: 12.5 mg via INTRAVENOUS
  Filled 2018-06-04: qty 1

## 2018-06-04 MED ORDER — ACETAMINOPHEN 325 MG PO TABS
650.0000 mg | ORAL_TABLET | Freq: Four times a day (QID) | ORAL | Status: DC | PRN
  Administered 2018-06-04 – 2018-06-05 (×2): 650 mg via ORAL
  Filled 2018-06-04 (×3): qty 2

## 2018-06-04 MED ORDER — ONDANSETRON HCL 4 MG/2ML IJ SOLN: 4.0000 mg | Freq: Four times a day (QID) | INTRAMUSCULAR | Status: DC | PRN

## 2018-06-04 MED ORDER — TRAMADOL HCL 50 MG PO TABS
50.0000 mg | ORAL_TABLET | Freq: Four times a day (QID) | ORAL | Status: DC | PRN
  Filled 2018-06-04: qty 1

## 2018-06-04 MED ORDER — ONDANSETRON HCL 4 MG PO TABS: 4.0000 mg | ORAL_TABLET | Freq: Four times a day (QID) | ORAL | Status: DC | PRN

## 2018-06-04 MED ORDER — CINACALCET HCL 30 MG PO TABS
60.0000 mg | ORAL_TABLET | Freq: Every day | ORAL | Status: DC
  Administered 2018-06-04 – 2018-06-05 (×2): 60 mg via ORAL
  Filled 2018-06-04 (×4): qty 2

## 2018-06-04 MED ORDER — HEPARIN SODIUM (PORCINE) 5000 UNIT/ML IJ SOLN
5000.0000 [IU] | Freq: Three times a day (TID) | INTRAMUSCULAR | Status: DC
  Administered 2018-06-04 – 2018-06-06 (×6): 5000 [IU] via SUBCUTANEOUS
  Filled 2018-06-04 (×6): qty 1

## 2018-06-04 MED ORDER — SEVELAMER CARBONATE 800 MG PO TABS
1600.0000 mg | ORAL_TABLET | ORAL | Status: DC
  Filled 2018-06-04: qty 2

## 2018-06-04 MED ORDER — NITROGLYCERIN 0.4 MG SL SUBL: 0.4000 mg | SUBLINGUAL_TABLET | SUBLINGUAL | Status: DC | PRN

## 2018-06-04 MED ORDER — SENNOSIDES-DOCUSATE SODIUM 8.6-50 MG PO TABS
1.0000 | ORAL_TABLET | Freq: Every evening | ORAL | Status: DC | PRN
  Administered 2018-06-05: 1 via ORAL
  Filled 2018-06-04 (×2): qty 1

## 2018-06-04 MED ORDER — TRAZODONE HCL 50 MG PO TABS
ORAL_TABLET | ORAL | Status: AC
  Filled 2018-06-04: qty 1

## 2018-06-04 MED ORDER — CLOPIDOGREL BISULFATE 75 MG PO TABS
75.0000 mg | ORAL_TABLET | Freq: Every day | ORAL | Status: DC
  Administered 2018-06-04 – 2018-06-06 (×3): 75 mg via ORAL
  Filled 2018-06-04 (×3): qty 1

## 2018-06-04 MED ORDER — OXYCODONE HCL 5 MG PO TABS
5.0000 mg | ORAL_TABLET | ORAL | Status: DC | PRN
  Filled 2018-06-04 (×2): qty 1

## 2018-06-04 MED ORDER — DIPHENHYDRAMINE HCL 25 MG PO CAPS
25.0000 mg | ORAL_CAPSULE | Freq: Four times a day (QID) | ORAL | Status: DC | PRN
  Filled 2018-06-04: qty 1

## 2018-06-04 MED ORDER — ACETAMINOPHEN 650 MG RE SUPP: 650.0000 mg | Freq: Four times a day (QID) | RECTAL | Status: DC | PRN

## 2018-06-04 MED ORDER — ROSUVASTATIN CALCIUM 10 MG PO TABS
10.0000 mg | ORAL_TABLET | Freq: Every day | ORAL | Status: DC
  Administered 2018-06-04 – 2018-06-06 (×3): 10 mg via ORAL
  Filled 2018-06-04 (×3): qty 1

## 2018-06-04 NOTE — Progress Notes (Signed)
[facility], [place] 06/04/18  Subjective:   Admitted on 06/03/2018 for Chest pain, unspecified type [R07.9]  Placed on extra hemodialysis treatment for volume overload.   Objective:  Vital signs in last 24 hours:  Temp:  [96.6 F (35.9 C)-98.7 F (37.1 C)] 96.6 F (35.9 C) (12/12 1220) Pulse Rate:  [75-84] 76 (12/12 1245) Resp:  [16-17] 16 (12/11 2330) BP: (98-113)/(57-84) 101/65 (12/12 1245) SpO2:  [97 %-100 %] 98 % (12/12 1220) Weight:  [76.8 kg-77.1 kg] 76.8 kg (12/12 1220)  Weight change:  Filed Weights   06/04/18 0015 06/04/18 0532 06/04/18 1220  Weight: 77.1 kg 77 kg 76.8 kg    Intake/Output:    Intake/Output Summary (Last 24 hours) at 06/04/2018 1457 Last data filed at 06/04/2018 0947 Gross per 24 hour  Intake 360 ml  Output 0 ml  Net 360 ml     Physical Exam: General:  Laying in the bed, no acute distress  HEENT  anicteric, moist oral mucous membranes  Neck  supple  Pulm/lungs  normal breathing effort, clear to auscultation, room air  CVS/Heart  regular no rubs  Abdomen:   Soft, nontender  Extremities:  Rt knee bandaged, no edema  Neurologic:  Alert, oriented  Skin:  No acute rashes  Access:  LUE aneurysmal AVF       Basic Metabolic Panel:  Recent Labs  Lab 06/03/18 2113 06/04/18 0326  NA 135 136  K 3.3* 3.8  CL 92* 94*  CO2 31 31  GLUCOSE 101* 82  BUN 19 21  CREATININE 3.49* 3.93*  CALCIUM 8.0* 7.9*     CBC: Recent Labs  Lab 06/03/18 2113 06/04/18 0326  WBC 7.6 6.2  HGB 7.8* 7.6*  HCT 26.1* 25.3*  MCV 90.3 90.4  PLT 183 180     No results found for: HEPBSAG, HEPBSAB, HEPBIGM    Microbiology:  Recent Results (from the past 240 hour(s))  MRSA PCR Screening     Status: None   Collection Time: 06/04/18 12:35 AM  Result Value Ref Range Status   MRSA by PCR NEGATIVE NEGATIVE Final    Comment:        The GeneXpert MRSA Assay (FDA approved for NASAL specimens only), is one component of  a comprehensive MRSA colonization surveillance program. It is not intended to diagnose MRSA infection nor to guide or monitor treatment for MRSA infections. Performed at [facility], [address] [place], [place] [address]     Coagulation Studies: No results for input(s): LABPROT, INR in the last 72 hours.  Urinalysis: No results for input(s): COLORURINE, LABSPEC, PHURINE, GLUCOSEU, HGBUR, BILIRUBINUR, KETONESUR, PROTEINUR, UROBILINOGEN, NITRITE, LEUKOCYTESUR in the last 72 hours.  Invalid input(s): APPERANCEUR    Imaging: Dg Chest 2 View  Result Date: 06/03/2018 CLINICAL DATA:  Altered mental status and chest pressure. EXAM: CHEST - 2 VIEW COMPARISON:  05/25/2018; 04/06/2018; 08/29/2017; Dec 08, 202019 FINDINGS: Grossly unchanged cardiac silhouette and mediastinal contours with nodular prominence of the bilateral pulmonary hila. Post median sternotomy. Atherosclerotic plaque within thoracic aorta. Post aortic valve replacement. Unchanged small potentially partially loculated left-sided pleural effusion with associated left basilar consolidative opacities. No new focal airspace opacities. No evidence of edema. No pneumothorax. No acute osseus abnormalities. Post right-sided rotator cuff repair. IMPRESSION: 1. Stable examination without definitive superimposed acute cardiopulmonary disease. 2. Similar findings of chronic small left-sided effusion and associated left basilar opacities, likely atelectasis or scar. Electronically Signed   By: Sandi Mariscal M.D.   On: 06/03/2018  21:34   Ct Head Wo Contrast  Result Date: 06/03/2018 CLINICAL DATA:  Altered mental status history of fall 2 days ago EXAM: CT HEAD WITHOUT CONTRAST TECHNIQUE: Contiguous axial images were obtained from the base of the skull through the vertex without intravenous contrast. COMPARISON:  MRI 07/24/2017, CT brain 04/24/2016 FINDINGS: Brain: No acute territorial infarction, hemorrhage or intracranial mass. Mild  motion degradation. Moderate atrophy. Mild small vessel ischemic changes of the white matter Vascular: No hyperdense vessels.  Carotid vascular calcification. Skull: Normal. Negative for fracture or focal lesion. Sinuses/Orbits: No acute finding. Other: None IMPRESSION: 1. No CT evidence for acute intracranial abnormality. 2. Atrophy and mild small vessel ischemic changes of the white matter Electronically Signed   By: Donavan Foil M.D.   On: 06/03/2018 21:33   Ct Angio Chest Pe W And/or Wo Contrast  Result Date: 06/03/2018 CLINICAL DATA:  Chest pain anemia hemodialysis history of right knee replacement revision EXAM: CT ANGIOGRAPHY CHEST WITH CONTRAST TECHNIQUE: Multidetector CT imaging of the chest was performed using the standard protocol during bolus administration of intravenous contrast. Multiplanar CT image reconstructions and MIPs were obtained to evaluate the vascular anatomy. CONTRAST:  68mL OMNIPAQUE IOHEXOL 350 MG/ML SOLN COMPARISON:  Radiograph 06/03/2018, CT chest 07/25/2016 FINDINGS: Cardiovascular: Satisfactory opacification of the pulmonary arteries to the segmental level. No evidence for central or segmental filling defect. Under filling of distal subsegmental left lower lobe branch vessel. Status post CABG. Moderate aortic atherosclerosis. No aneurysm. Coronary vascular calcification. Cardiomegaly. Aortic replacement. No large pericardial effusion. Mediastinum/Nodes: Midline trachea. No thyroid mass. No significant adenopathy. Esophagus within normal limits. Low density within the right axilla with peripheral enhancement, possible axillary nodes. Lungs/Pleura: Small moderate left pleural effusion. Stable small right upper lobe pulmonary nodule. Partial consolidation in the left lower lobe. Upper Abdomen: Multiple low-density lesions in the liver, some with calcification consistent with cysts. Probable cysts in the kidneys. Musculoskeletal: Degenerative changes. Post sternotomy changes.  Suspected right scapular fracture, incompletely visualized. Diffuse soft tissue changes at the right shoulder joint may reflect effusion and soft tissue edema related to fracture. Review of the MIP images confirms the above findings. IMPRESSION: 1. No acute central embolus is seen. There is poor opacification of a distal subsegmental left lower lobe branch vessel, further evaluation limited by respiratory motion artifact and consolidated lung in the region. Small distal embolus in this region can not be excluded. 2. Cardiomegaly. Small moderate left pleural effusion. Consolidation in the left lower lobe may reflect atelectasis or pneumonia 3. Diffuse soft tissue changes about the right shoulder with possible incompletely visualized right scapular fracture. 4. Numerous liver cysts.  Probable renal cysts. Aortic Atherosclerosis (ICD10-I70.0). Electronically Signed   By: Donavan Foil M.D.   On: 06/03/2018 23:53     Medications:    . cinacalcet  60 mg Oral Q breakfast  . clopidogrel  75 mg Oral Daily  . heparin  5,000 Units Subcutaneous Q8H  . rosuvastatin  10 mg Oral Daily  . sevelamer carbonate  1,600 mg Oral With snacks  . sevelamer carbonate  2,400 mg Oral TID WC   acetaminophen **OR** acetaminophen, diphenhydrAMINE, nitroGLYCERIN, ondansetron **OR** ondansetron (ZOFRAN) IV, oxyCODONE  Assessment/ Plan:  77 y.o. white male with ESRD, hypothyroidism, hypertension, BPH, CAD status post CABG, hyperlipidemia, right total knee revision arthroplasty May 22, 2018 Dr. Marry Guan  [facility] [address] neph[facility]/MWF  1.  End-stage renal disease Extra treatment today for volume overload.  Treatment again for tomorrow.   2.  Anemia of  chronic kidney disease Continue mircera as outpatient.   3.  Secondary hyperparathyroidism Continue Renvela with meals.    LOS: 1 Lavonia Dana 12/12/20192:57 PM  [place], [place]

## 2018-06-04 NOTE — Progress Notes (Signed)
Pt sleeping   06/04/18 1330  Vital Signs  Pulse Rate 77  Pulse Rate Source Monitor  BP 109/62  BP Location Right Arm  BP Method Automatic  Patient Position (if appropriate) Lying  During Hemodialysis Assessment  Blood Flow Rate (mL/min) 400 mL/min  Arterial Pressure (mmHg) 140 mmHg  Venous Pressure (mmHg) 180 mmHg  Transmembrane Pressure (mmHg) 60 mmHg  Ultrafiltration Rate (mL/min) 850 mL/min  Dialysate Flow Rate (mL/min) 600 ml/min  Conductivity: Machine  13.6  HD Safety Checks Performed Yes  Intra-Hemodialysis Comments Progressing as prescribed

## 2018-06-04 NOTE — Progress Notes (Signed)
Pt stable, BP on the lower side of normal, will continue to monitor , tx started without a problem   06/04/18 1236  Vital Signs  Pulse Rate 78  Pulse Rate Source Monitor  BP 98/65  BP Location Right Arm  BP Method Automatic  Patient Position (if appropriate) Lying  During Hemodialysis Assessment  Blood Flow Rate (mL/min) 400 mL/min  Arterial Pressure (mmHg) 140 mmHg  Venous Pressure (mmHg) 190 mmHg  Transmembrane Pressure (mmHg) 60 mmHg  Ultrafiltration Rate (mL/min) 850 mL/min  Dialysate Flow Rate (mL/min) 600 ml/min  Conductivity: Machine  13.6  HD Safety Checks Performed Yes  Dialysis Fluid Bolus Normal Saline  Bolus Amount (mL) 250 mL  Intra-Hemodialysis Comments Tx initiated

## 2018-06-04 NOTE — Progress Notes (Signed)
Pt alert, denies problem   06/04/18 1345  Vital Signs  Pulse Rate 70  Pulse Rate Source Monitor  BP 114/62  BP Location Right Arm  BP Method Automatic  Patient Position (if appropriate) Lying  During Hemodialysis Assessment  Blood Flow Rate (mL/min) 400 mL/min  Arterial Pressure (mmHg) 140 mmHg  Venous Pressure (mmHg) 180 mmHg  Transmembrane Pressure (mmHg) 60 mmHg  Ultrafiltration Rate (mL/min) 850 mL/min  Dialysate Flow Rate (mL/min) 600 ml/min  Conductivity: Machine  13.7  HD Safety Checks Performed Yes  Intra-Hemodialysis Comments Progressing as prescribed

## 2018-06-04 NOTE — Progress Notes (Signed)
Pt tolerated well, denies c/o , tx ended with no problems   06/04/18 1549  Vital Signs  Temp 97.6 F (36.4 C)  Temp Source Oral  Pulse Rate 71  Pulse Rate Source Monitor  BP 114/64  BP Location Right Arm  BP Method Automatic  Patient Position (if appropriate) Lying  During Hemodialysis Assessment  Blood Flow Rate (mL/min) 400 mL/min  Arterial Pressure (mmHg) 140 mmHg  Venous Pressure (mmHg) 180 mmHg  Transmembrane Pressure (mmHg) 60 mmHg  Ultrafiltration Rate (mL/min) 850 mL/min  Dialysate Flow Rate (mL/min) 600 ml/min  Conductivity: Machine  13.7  HD Safety Checks Performed Yes  Dialysis Fluid Bolus Normal Saline  Bolus Amount (mL) 250 mL  Intra-Hemodialysis Comments Tx completed;Tolerated well

## 2018-06-04 NOTE — Progress Notes (Addendum)
Update 2059: Pt and family refused refused to take renvela tonight. Will contineu to monitor.  Pt family mentioned that pt does not want to take oxycodone and want to switch to tramadol, request to have an order for a stool softener and concerned of right lef ( knee part more warmer than the left leg). Notify prime. Will continue to monitor.  Update 2201: Dr. Jannifer Franklin ordered senna-docusate oral 1 tablet at bedtime as needed for mild constipation, tramadol 50 mg tablet oral every 6 hours as needed for moderate pain. Pt does have an order for both lower extremity ultrasound. Called Ultrasound and states pt ultrasound is schedule 06/05/18 ( routine ).Will continue to monitor.  Update 2236: Pt refusing to go downstairs for lower extremity ultrasound and states to just go in the morning (06/04/18). Will continue to monitor.   Update 2310: Pt refused both the tramadol and senokot. Will continue to monitor.  Update 0006: Daughter came to me and requested for a sleeping meds and states " Pts wants to take ambien" and pt request tylenol for pain. Notify prime. Will continue to monitor.  Update 0014: Dr. Jannifer Franklin ordered Ambien 5 mg tablet oral once. Will continue to monitor.  Update [phone]: Pt IV came off. IV team was consulted. Will continue to monitor.

## 2018-06-04 NOTE — Progress Notes (Signed)
Pt stable, denies c/o

## 2018-06-04 NOTE — Progress Notes (Signed)
PT Cancellation Note  Patient Details Name: Kekoa Fyock MRN: [id number] DOB: 1941-06-24   Cancelled Treatment:    Reason Eval/Treat Not Completed: Patient at procedure or test/unavailable(Consult received and chart reviewed.  Patient currently off unit at dialysis.  Will re-attempt at later time/date as medically appropriate and available.)   Zyree Traynham H. Owens Shark, PT, DPT, NCS 06/04/18, 2:25 PM [phone]

## 2018-06-04 NOTE — Plan of Care (Signed)
Patient was admitted at 0000. Some complaints of pain with left arm, but refused any pain medication. Patient is having trouble finding some words to complete his sentences. It is spontaneous in nature but not frequent. Patient is able to complete patient profile. Significant other is at the bedside.

## 2018-06-04 NOTE — Progress Notes (Signed)
Post dialysis assessment   06/04/18 1600  Neurological  Level of Consciousness Alert  Orientation Level Oriented X4  Respiratory  Respiratory Pattern Regular;Unlabored  Chest Assessment Chest expansion symmetrical  Cardiac  Pulse Regular  Vascular  R Radial Pulse +2  L Radial Pulse +2  R Dorsalis Pedis Pulse +1  L Dorsalis Pedis Pulse +1  Edema Right lower extremity;Left lower extremity  RLE Edema +2  LLE Edema +2  Psychosocial  Psychosocial (WDL) X  Patient Behaviors Blunted affect  Needs Expressed Emotional  Emotional support given Given to patient

## 2018-06-04 NOTE — Care Management Note (Signed)
Case Management Note  Patient Details  Name: Jimmy Carson MRN: [id number] Date of Birth: January 05, 1941  Subjective/Objective:    Patient is originally from home alone and independent.   He had knee surgery and was discharged to [facility].  After approximately 4-5 days there he fell and has had AMS.  Son Jimmy Carson and friend Jimmy Carson at bedside.  Jimmy Carson is HPOA.  She states that the family does not want him to return to [facility].  Would prefer [facility].  Jimmy Carson, CSW made aware.  They deny difficulties obtaining medications or with medical care.  He has a walker and a cane.  Uses [facility] in [place] as pharmacy.  He is current with his PCP.  No further needs identified at this time for CM.  Will continue to follow through patient discharge.                  Action/Plan:   Expected Discharge Date:                  Expected Discharge Plan:  [place]  In-House Referral:     Discharge planning Services  CM Consult  Post Acute Care Choice:    Choice offered to:     DME Arranged:    DME Agency:     HH Arranged:    [place] Agency:     Status of Service:  In process, will continue to follow  If discussed at Long Length of Stay Meetings, dates discussed:    Additional Comments:  Jimmy Rafter, RN 06/04/2018, 12:20 PM

## 2018-06-04 NOTE — Progress Notes (Signed)
Low bp, uf off , will continue to monitor   06/04/18 1300  Vital Signs  Pulse Rate 72  Pulse Rate Source Monitor  BP (!) 78/65  During Hemodialysis Assessment  Blood Flow Rate (mL/min) 400 mL/min  Arterial Pressure (mmHg) 140 mmHg  Venous Pressure (mmHg) 190 mmHg  Transmembrane Pressure (mmHg) 60 mmHg  Ultrafiltration Rate (mL/min) 850 mL/min  Dialysate Flow Rate (mL/min) 600 ml/min  Conductivity: Machine  13.6  HD Safety Checks Performed Yes  Intra-Hemodialysis Comments See progress note

## 2018-06-04 NOTE — Consult Note (Signed)
[facility] Cardiology  CARDIOLOGY CONSULT NOTE  Patient ID: Jimmy Carson MRN: [id number] DOB/AGE: 03/22/1941 77 y.o.  Admit date: 06/03/2018 Referring Physician Dr. Bridgett Larsson Primary Physician Dr. Netty Starring  Primary Cardiologist Dr. Saralyn Pilar  Reason for Consultation Chest pain  HPI:  Jimmy Carson is a 77 year old male with a past medical history significant for coronary artery disease s/p PCI in 2012, CABG in 2013, aortic valve disease s/p TAVR in 2016, end stage renal disease on dialysis, hypertension, and hyperlipidemia who presented to the ED on 06/03/18 with complaints of sharp, left sided chest pain with associated shoulder pain.  Symptoms were different than chest pressure he experienced prior to previous heart attacks.  ED workup included elevated troponin at .34, .31, and .32 respectively.  ECG revealed normal sinus rhythm with left bundle branch block, similar to previous ECGs. CT angiogram was negative for PE, but a small to moderate left pleural effusion was noted with consolidation in left lower lobe.   Today, Mr. Morsch denies chest pain, shortness of breath, palpitations, or lower extremity swelling, but appears slightly confused and keeps asking why he's in [place].  Admits to left shoulder pain in which he reports is being followed by orthopedics.  Denies N/V/D.  Denies syncope or presyncope.  Denies lower extremity swelling.   Followed by Dr. Saralyn Pilar in outpatient cardiology at [facility] clinic. 2D echocardiogram 01/06/2018 revealed LVEF of 35%, stable appearing aortic valve prosthesis, mean gradient 17.2 mmHg, mean gradient 35.7 mmHg, with moderate pulmonary hypertension.  Lexiscan Myoview revealed LVEF of 35%, with moderate inferior scar with minimal ischemia.   Review of systems complete and found to be negative unless listed above     Past Medical History:  Diagnosis Date  . Anemia   . CAD (coronary artery disease)    s/p percutaneous coronary angioplasty, s/p  CABG x 6   . ESRD (end stage renal disease) ([place])   . Heart attack ([place])   . History of radiation therapy    for prostate CA  . Hyperlipidemia   . Hyperparathyroidism, secondary renal ([place])   . Hypertension   . Osteoarthritis   . Polycystic kidney disease   . Prostate CA [facility]) 2012   Brachytherapy  . Renal insufficiency    currently on dialysis  . Shingles     Past Surgical History:  Procedure Laterality Date  . AORTIC VALVE REPLACEMENT    . CARDIAC CATHETERIZATION N/A 07/24/2015   Procedure: Coronary/Graft Angiography;  Surgeon: Corey Skains, MD;  Location: [facility];  Service: Cardiovascular;  Laterality: N/A;  . CARDIAC CATHETERIZATION N/A 06/03/2016   Procedure: Left Heart Cath and Coronary Angiography;  Surgeon: Isaias Cowman, MD;  Location: [facility];  Service: Cardiovascular;  Laterality: N/A;  . CORONARY ARTERY BYPASS GRAFT    . HERNIA REPAIR    . JOINT REPLACEMENT    . REPLACEMENT TOTAL KNEE BILATERAL    . ROTATOR CUFF REPAIR Right   . TOTAL KNEE REVISION Right [id number]   Procedure: TOTAL KNEE REVISION;  Surgeon: Dereck Leep, MD;  Location: [facility] ORS;  Service: Orthopedics;  Laterality: Right;    Medications Prior to Admission  Medication Sig Dispense Refill Last Dose  . acetaminophen (TYLENOL) 500 MG tablet Take 1,000 mg by mouth every 6 (six) hours as needed for mild pain or headache.    prn at prn  . cinacalcet (SENSIPAR) 60 MG tablet Take 60 mg by mouth daily.    Unknown at Unknown  . clopidogrel (  PLAVIX) 75 MG tablet Take 75 mg by mouth daily.   Unknown at Unknown  . isosorbide mononitrate (IMDUR) 60 MG 24 hr tablet Take 60 mg by mouth daily.   Unknown at Unknown  . nitroGLYCERIN (NITROSTAT) 0.4 MG SL tablet Place 1 tablet (0.4 mg total) under the tongue every 5 (five) minutes as needed for chest pain. 30 tablet 0 prn at prn  . pyridOXINE (VITAMIN B-6) 100 MG tablet Take 100 mg by mouth daily.   Unknown at Unknown  .  rosuvastatin (CRESTOR) 10 MG tablet Take 10 mg by mouth daily.   Unknown at Unknown  . sevelamer carbonate (RENVELA) 800 MG tablet Take 1,600-2,400 mg by mouth See admin instructions. Take 2400 mg by mouth 3 times daily with meals and take 1600 mg by mouth with snacks   Unknown at Unknown  . tamsulosin (FLOMAX) 0.4 MG CAPS capsule Take 0.4 mg by mouth daily.    Unknown at Unknown  . traMADol (ULTRAM) 50 MG tablet Take 1-2 tablets (50-100 mg total) by mouth every 4 (four) hours as needed for moderate pain. 30 tablet 0 prn at prn  . traZODone (DESYREL) 50 MG tablet Take 50 mg by mouth at bedtime.   Unknown at Unknown  . oxyCODONE (OXY IR/ROXICODONE) 5 MG immediate release tablet Take 1 tablet (5 mg total) by mouth every 4 (four) hours as needed for moderate pain (pain score 4-6). (Patient not taking: Reported on 06/03/2018) 30 tablet 0 Completed Course at Unknown time   Social History   Socioeconomic History  . Marital status: Widowed    Spouse name: Not on file  . Number of children: Not on file  . Years of education: Not on file  . Highest education level: Not on file  Occupational History  . Not on file  Social Needs  . Financial resource strain: Not on file  . Food insecurity:    Worry: Not on file    Inability: Not on file  . Transportation needs:    Medical: Not on file    Non-medical: Not on file  Tobacco Use  . Smoking status: Former Smoker    Last attempt to quit: 04/19/2013    Years since quitting: 5.1  . Smokeless tobacco: Never Used  Substance and Sexual Activity  . Alcohol use: No  . Drug use: No  . Sexual activity: Not on file  Lifestyle  . Physical activity:    Days per week: Not on file    Minutes per session: Not on file  . Stress: Not on file  Relationships  . Social connections:    Talks on phone: Not on file    Gets together: Not on file    Attends religious service: Not on file    Active member of club or organization: Not on file    Attends meetings of  clubs or organizations: Not on file    Relationship status: Not on file  . Intimate partner violence:    Fear of current or ex partner: Not on file    Emotionally abused: Not on file    Physically abused: Not on file    Forced sexual activity: Not on file  Other Topics Concern  . Not on file  Social History Narrative  . Not on file    Family History  Problem Relation Age of Onset  . Polycystic kidney disease Father   . Aneurysm Father      Review of systems complete and found to be  negative unless listed above    PHYSICAL EXAM  General: Well developed, well nourished. Sitting up in the chair in no acute distress HEENT:  Normocephalic and atramatic Neck:  No JVD.  Lungs: Clear bilaterally to auscultation and percussion. Heart: HRRR . Normal S1 and S2 without gallops or murmurs.  Abdomen: Bowel sounds are positive, abdomen soft and non-tender  Msk:  Back normal.  Normal strength and tone for age. Gait not assessed Extremities: No clubbing, cyanosis or edema.   Neuro: Alert and oriented to person and time only Psych:  Confused, responds appropriately  Labs:   Lab Results  Component Value Date   WBC 6.2 06/04/2018   HGB 7.6 (L) 06/04/2018   HCT 25.3 (L) 06/04/2018   MCV 90.4 06/04/2018   PLT 180 06/04/2018    Recent Labs  Lab 06/03/18 2113 06/04/18 0326  NA 135 136  K 3.3* 3.8  CL 92* 94*  CO2 31 31  BUN 19 21  CREATININE 3.49* 3.93*  CALCIUM 8.0* 7.9*  PROT 6.4*  --   BILITOT 0.9  --   ALKPHOS 90  --   ALT 24  --   AST 45*  --   GLUCOSE 101* 82   Lab Results  Component Value Date   CKTOTAL 100 07/05/2014   CKMB 6.7 (H) 07/05/2014   TROPONINI 0.32 (HH) 06/04/2018   No results found for: CHOL No results found for: HDL No results found for: LDLCALC No results found for: TRIG No results found for: CHOLHDL No results found for: LDLDIRECT    Radiology: Dg Chest 1 View  Result Date: 05/25/2018 CLINICAL DATA:  Patient with cough for 2 days. EXAM: CHEST   1 VIEW COMPARISON:  Chest radiograph 04/06/2018 FINDINGS: Patient is rotated to the left. Stable cardiomegaly. Diffuse bilateral interstitial pulmonary opacities. Moderate left pleural effusion with underlying consolidation. IMPRESSION: Moderate left pleural effusion and underlying opacities which may represent atelectasis or infection. Pulmonary vascular redistribution and mild interstitial edema. Enlarged pulmonary arteries. Electronically Signed   By: Lovey Newcomer M.D.   On: 05/25/2018 20:50   Dg Chest 2 View  Result Date: 06/03/2018 CLINICAL DATA:  Altered mental status and chest pressure. EXAM: CHEST - 2 VIEW COMPARISON:  05/25/2018; 04/06/2018; 08/29/2017; May 28, 202019 FINDINGS: Grossly unchanged cardiac silhouette and mediastinal contours with nodular prominence of the bilateral pulmonary hila. Post median sternotomy. Atherosclerotic plaque within thoracic aorta. Post aortic valve replacement. Unchanged small potentially partially loculated left-sided pleural effusion with associated left basilar consolidative opacities. No new focal airspace opacities. No evidence of edema. No pneumothorax. No acute osseus abnormalities. Post right-sided rotator cuff repair. IMPRESSION: 1. Stable examination without definitive superimposed acute cardiopulmonary disease. 2. Similar findings of chronic small left-sided effusion and associated left basilar opacities, likely atelectasis or scar. Electronically Signed   By: Sandi Mariscal M.D.   On: 06/03/2018 21:34   Dg Shoulder Right  Result Date: 05/26/2018 CLINICAL DATA:  Right shoulder pain, discoloration. No recent injury. Previous right rotator cuff surgery. EXAM: RIGHT SHOULDER - 2+ VIEW COMPARISON:  05/25/2017, 04/06/2017 chest x-ray FINDINGS: There is a defect along the MEDIAL aspect of the RIGHT humeral neck. Increased distance between the humerus and glenoid fossa raises question of a space-occupying mass or fluid. There is also possible lucency of the coracoid  and glenoid fossa. Remote RIGHT shoulder ligamentous repair. IMPRESSION: Lytic lesion involving the proximal RIGHT humerus and possible joint space fluid or mass. Cannot exclude lytic lesion of the glenoid fossa/coracoid. Recommend further evaluation with  shoulder MRI. These results will be called to the ordering clinician or representative by the Radiologist Assistant, and communication documented in the PACS or zVision Dashboard. Electronically Signed   By: Nolon Nations M.D.   On: 05/26/2018 13:39   Ct Head Wo Contrast  Result Date: 06/03/2018 CLINICAL DATA:  Altered mental status history of fall 2 days ago EXAM: CT HEAD WITHOUT CONTRAST TECHNIQUE: Contiguous axial images were obtained from the base of the skull through the vertex without intravenous contrast. COMPARISON:  MRI 07/24/2017, CT brain 04/24/2016 FINDINGS: Brain: No acute territorial infarction, hemorrhage or intracranial mass. Mild motion degradation. Moderate atrophy. Mild small vessel ischemic changes of the white matter Vascular: No hyperdense vessels.  Carotid vascular calcification. Skull: Normal. Negative for fracture or focal lesion. Sinuses/Orbits: No acute finding. Other: None IMPRESSION: 1. No CT evidence for acute intracranial abnormality. 2. Atrophy and mild small vessel ischemic changes of the white matter Electronically Signed   By: Donavan Foil M.D.   On: 06/03/2018 21:33   Ct Angio Chest Pe W And/or Wo Contrast  Result Date: 06/03/2018 CLINICAL DATA:  Chest pain anemia hemodialysis history of right knee replacement revision EXAM: CT ANGIOGRAPHY CHEST WITH CONTRAST TECHNIQUE: Multidetector CT imaging of the chest was performed using the standard protocol during bolus administration of intravenous contrast. Multiplanar CT image reconstructions and MIPs were obtained to evaluate the vascular anatomy. CONTRAST:  29mL OMNIPAQUE IOHEXOL 350 MG/ML SOLN COMPARISON:  Radiograph 06/03/2018, CT chest 07/25/2016 FINDINGS:  Cardiovascular: Satisfactory opacification of the pulmonary arteries to the segmental level. No evidence for central or segmental filling defect. Under filling of distal subsegmental left lower lobe branch vessel. Status post CABG. Moderate aortic atherosclerosis. No aneurysm. Coronary vascular calcification. Cardiomegaly. Aortic replacement. No large pericardial effusion. Mediastinum/Nodes: Midline trachea. No thyroid mass. No significant adenopathy. Esophagus within normal limits. Low density within the right axilla with peripheral enhancement, possible axillary nodes. Lungs/Pleura: Small moderate left pleural effusion. Stable small right upper lobe pulmonary nodule. Partial consolidation in the left lower lobe. Upper Abdomen: Multiple low-density lesions in the liver, some with calcification consistent with cysts. Probable cysts in the kidneys. Musculoskeletal: Degenerative changes. Post sternotomy changes. Suspected right scapular fracture, incompletely visualized. Diffuse soft tissue changes at the right shoulder joint may reflect effusion and soft tissue edema related to fracture. Review of the MIP images confirms the above findings. IMPRESSION: 1. No acute central embolus is seen. There is poor opacification of a distal subsegmental left lower lobe branch vessel, further evaluation limited by respiratory motion artifact and consolidated lung in the region. Small distal embolus in this region can not be excluded. 2. Cardiomegaly. Small moderate left pleural effusion. Consolidation in the left lower lobe may reflect atelectasis or pneumonia 3. Diffuse soft tissue changes about the right shoulder with possible incompletely visualized right scapular fracture. 4. Numerous liver cysts.  Probable renal cysts. Aortic Atherosclerosis (ICD10-I70.0). Electronically Signed   By: Donavan Foil M.D.   On: 06/03/2018 23:53   Dg Knee Right Port  Result Date: 110-26-202019 CLINICAL DATA:  Total knee replacement EXAM:  PORTABLE RIGHT KNEE - 1-2 VIEW COMPARISON:  CT 04/02/2018 FINDINGS: Changes of right total knee revision. Soft tissue gas and drains noted. No hardware bony complicating feature. IMPRESSION: Right knee replacement revision. No acute bony abnormality or hardware complicating feature. Electronically Signed   By: Rolm Baptise M.D.   On: 110-26-202019 13:50    EKG: Normal sinus rhythm with left bundle branch block  ASSESSMENT AND PLAN:  1.  Chest pain, elevated troponin   -Likely demand ischemia in the setting of end stage renal disease on dialysis   -Cardiac catheterization deferred due to stable troponins, resolution of symptoms, and end stage renal disease  -Continue Plavix; Nitro as needed 2.  Hyperlipidemia   -Continue rosuvastatin 10mg  once nightly  3.  Acute delirium   -Head CT negative for acute intracranial abnormality   The history, physical exam findings, and plan of care were all discussed with Dr. Bartholome Bill, and all decision making was made in collaboration.   Signed: Avie Arenas PA-C 06/04/2018, 12:36 PM

## 2018-06-04 NOTE — Progress Notes (Signed)
Pre dialysis assessment 

## 2018-06-04 NOTE — Progress Notes (Signed)
Pt stable , denies   06/04/18 1430  Vital Signs  Pulse Rate 72  Pulse Rate Source Monitor  BP 112/62  BP Location Right Arm  BP Method Automatic  Patient Position (if appropriate) Lying  During Hemodialysis Assessment  Blood Flow Rate (mL/min) 400 mL/min  Arterial Pressure (mmHg) 150 mmHg  Venous Pressure (mmHg) 180 mmHg  Transmembrane Pressure (mmHg) 60 mmHg  Ultrafiltration Rate (mL/min) 850 mL/min  Dialysate Flow Rate (mL/min) 600 ml/min  Conductivity: Machine  13.6  HD Safety Checks Performed Yes  Intra-Hemodialysis Comments Progressing as prescribed   c/o

## 2018-06-04 NOTE — Progress Notes (Signed)
[facility] NAME: Jimmy Carson    MR#:  [id number]  DATE OF BIRTH:  10-12-1940  SUBJECTIVE:  CHIEF COMPLAINT:   Chief Complaint  Patient presents with  . Altered Mental Status  . Chest Pain   Patient has no complaints except skin itch. REVIEW OF SYSTEMS:  Review of Systems  Constitutional: Negative for chills, fever and malaise/fatigue.  HENT: Negative for sore throat.   Eyes: Negative for blurred vision and double vision.  Respiratory: Negative for cough, hemoptysis, shortness of breath, wheezing and stridor.   Cardiovascular: Negative for chest pain, palpitations, orthopnea and leg swelling.  Gastrointestinal: Negative for abdominal pain, blood in stool, diarrhea, melena, nausea and vomiting.  Genitourinary: Negative for dysuria, flank pain and hematuria.  Musculoskeletal: Negative for back pain and joint pain.  Skin: Positive for itching. Negative for rash.  Neurological: Negative for dizziness, sensory change, focal weakness, seizures, loss of consciousness, weakness and headaches.  Endo/Heme/Allergies: Negative for polydipsia.  Psychiatric/Behavioral: Negative for depression. The patient is not nervous/anxious.     DRUG ALLERGIES:   Allergies  Allergen Reactions  . Orange Fruit [Citrus] Hives  . Penicillins Hives and Other (See Comments)    Has patient had a PCN reaction causing immediate rash, facial/tongue/throat swelling, SOB or lightheadedness with hypotension: Yes Has patient had a PCN reaction causing severe rash involving mucus membranes or skin necrosis: No Has patient had a PCN reaction that required hospitalization No Has patient had a PCN reaction occurring within the last 10 years: No If all of the above answers are "NO", then may proceed with Cephalosporin use.    VITALS:  Blood pressure 116/64, pulse 81, temperature 99.1 F (37.3 C), temperature source Oral, resp. rate 16, height 5\' 10"   (1.778 m), weight 76.8 kg, SpO2 100 %. PHYSICAL EXAMINATION:  Physical Exam Constitutional:      General: He is not in acute distress. HENT:     Head: Normocephalic.     Mouth/Throat:     Mouth: Mucous membranes are moist.  Eyes:     General: No scleral icterus.    Extraocular Movements: Extraocular movements intact.     Conjunctiva/sclera: Conjunctivae normal.  Neck:     Musculoskeletal: Normal range of motion and neck supple.     Vascular: No JVD.     Trachea: No tracheal deviation.  Cardiovascular:     Rate and Rhythm: Normal rate and regular rhythm.     Heart sounds: Normal heart sounds. No murmur. No gallop.   Pulmonary:     Effort: Pulmonary effort is normal. No respiratory distress.     Breath sounds: Normal breath sounds. No wheezing or rales.  Abdominal:     General: Bowel sounds are normal. There is no distension.     Palpations: Abdomen is soft.     Tenderness: There is no abdominal tenderness. There is no rebound.  Musculoskeletal: Normal range of motion.        General: No tenderness.     Right lower leg: Edema present.     Left lower leg: Edema present.  Skin:    Findings: No erythema or rash.     Comments: Scratches on back, arm and legs  Neurological:     General: No focal deficit present.     Mental Status: He is alert and oriented to person, place, and time.     Cranial Nerves: No cranial nerve deficit.    LABORATORY PANEL:  Male  CBC Recent Labs  Lab 06/04/18 1537  WBC 6.7  HGB 8.0*  HCT 26.9*  PLT 189   ------------------------------------------------------------------------------------------------------------------ Chemistries  Recent Labs  Lab 06/03/18 2113  06/04/18 1537  NA 135   < > 134*  K 3.3*   < > 3.3*  CL 92*   < > 91*  CO2 31   < > 30  GLUCOSE 101*   < > 80  BUN 19   < > 25*  CREATININE 3.49*   < > 4.39*  CALCIUM 8.0*   < > 8.0*  AST 45*  --   --   ALT 24  --   --   ALKPHOS 90  --   --   BILITOT 0.9  --   --    < > =  values in this interval not displayed.   RADIOLOGY:  Dg Chest 2 View  Result Date: 06/03/2018 CLINICAL DATA:  Altered mental status and chest pressure. EXAM: CHEST - 2 VIEW COMPARISON:  05/25/2018; 04/06/2018; 08/29/2017; Jun 24, 202019 FINDINGS: Grossly unchanged cardiac silhouette and mediastinal contours with nodular prominence of the bilateral pulmonary hila. Post median sternotomy. Atherosclerotic plaque within thoracic aorta. Post aortic valve replacement. Unchanged small potentially partially loculated left-sided pleural effusion with associated left basilar consolidative opacities. No new focal airspace opacities. No evidence of edema. No pneumothorax. No acute osseus abnormalities. Post right-sided rotator cuff repair. IMPRESSION: 1. Stable examination without definitive superimposed acute cardiopulmonary disease. 2. Similar findings of chronic small left-sided effusion and associated left basilar opacities, likely atelectasis or scar. Electronically Signed   By: Sandi Mariscal M.D.   On: 06/03/2018 21:34   Ct Head Wo Contrast  Result Date: 06/03/2018 CLINICAL DATA:  Altered mental status history of fall 2 days ago EXAM: CT HEAD WITHOUT CONTRAST TECHNIQUE: Contiguous axial images were obtained from the base of the skull through the vertex without intravenous contrast. COMPARISON:  MRI 07/24/2017, CT brain 04/24/2016 FINDINGS: Brain: No acute territorial infarction, hemorrhage or intracranial mass. Mild motion degradation. Moderate atrophy. Mild small vessel ischemic changes of the white matter Vascular: No hyperdense vessels.  Carotid vascular calcification. Skull: Normal. Negative for fracture or focal lesion. Sinuses/Orbits: No acute finding. Other: None IMPRESSION: 1. No CT evidence for acute intracranial abnormality. 2. Atrophy and mild small vessel ischemic changes of the white matter Electronically Signed   By: Donavan Foil M.D.   On: 06/03/2018 21:33   Ct Angio Chest Pe W And/or Wo  Contrast  Result Date: 06/03/2018 CLINICAL DATA:  Chest pain anemia hemodialysis history of right knee replacement revision EXAM: CT ANGIOGRAPHY CHEST WITH CONTRAST TECHNIQUE: Multidetector CT imaging of the chest was performed using the standard protocol during bolus administration of intravenous contrast. Multiplanar CT image reconstructions and MIPs were obtained to evaluate the vascular anatomy. CONTRAST:  52mL OMNIPAQUE IOHEXOL 350 MG/ML SOLN COMPARISON:  Radiograph 06/03/2018, CT chest 07/25/2016 FINDINGS: Cardiovascular: Satisfactory opacification of the pulmonary arteries to the segmental level. No evidence for central or segmental filling defect. Under filling of distal subsegmental left lower lobe branch vessel. Status post CABG. Moderate aortic atherosclerosis. No aneurysm. Coronary vascular calcification. Cardiomegaly. Aortic replacement. No large pericardial effusion. Mediastinum/Nodes: Midline trachea. No thyroid mass. No significant adenopathy. Esophagus within normal limits. Low density within the right axilla with peripheral enhancement, possible axillary nodes. Lungs/Pleura: Small moderate left pleural effusion. Stable small right upper lobe pulmonary nodule. Partial consolidation in the left lower lobe. Upper Abdomen: Multiple low-density lesions in the liver, some with calcification consistent with  cysts. Probable cysts in the kidneys. Musculoskeletal: Degenerative changes. Post sternotomy changes. Suspected right scapular fracture, incompletely visualized. Diffuse soft tissue changes at the right shoulder joint may reflect effusion and soft tissue edema related to fracture. Review of the MIP images confirms the above findings. IMPRESSION: 1. No acute central embolus is seen. There is poor opacification of a distal subsegmental left lower lobe branch vessel, further evaluation limited by respiratory motion artifact and consolidated lung in the region. Small distal embolus in this region can  not be excluded. 2. Cardiomegaly. Small moderate left pleural effusion. Consolidation in the left lower lobe may reflect atelectasis or pneumonia 3. Diffuse soft tissue changes about the right shoulder with possible incompletely visualized right scapular fracture. 4. Numerous liver cysts.  Probable renal cysts. Aortic Atherosclerosis (ICD10-I70.0). Electronically Signed   By: Donavan Foil M.D.   On: 06/03/2018 23:53   ASSESSMENT AND PLAN:   Chest pain with elevated troponin.  History of CAD. Likely demand ischemia in the setting of end stage renal disease on dialysis.  Defer cardiac catheterization, continue Plavix and nitro PRN per Dr. Ubaldo Glassing. Echo: EF: 25% -   30%.  Acute metabolic encephalopathy.  Unclear etiology. CT head is unremarkable. The patient is AAOx3, but according to POA, this is not his baseline.   The patient has not sleep well for 3 days.   He does not have any focal neurologic deficit.    HTN (hypertension) -home dose antihypertensives   CAD (coronary artery disease) -continue home meds, other work-up as above   ESRD on hemodialysis.  Needed dialysis today and tomorrow per Dr. Juleen China.  Skin itch.  Possible due to ESRD.  Benadryl as needed.    HLD (hyperlipidemia) -continue Crestor.  Anemia of chronic disease.  Stable. Hypokalemia.  Adjust during hemodialysis.  Cussed with Dr. Juleen China. All the records are reviewed and case discussed with Care Management/Social Worker. Management plans discussed with the patient, his son and POA and they are in agreement.  CODE STATUS: Full Code  TOTAL TIME TAKING CARE OF THIS PATIENT: 45 minutes.   More than 50% of the time was spent in counseling/coordination of care: YES  POSSIBLE D/C IN 2 DAYS, DEPENDING ON CLINICAL CONDITION.   Demetrios Loll M.D on 06/04/2018 at 5:37 PM  Between 7am to 6pm - Pager - [phone]  After 6pm go to [email or web address] - [facility]

## 2018-06-04 NOTE — Progress Notes (Signed)
Pt resting comfortably   06/04/18 1445  Vital Signs  Pulse Rate 72  Pulse Rate Source Monitor  BP 112/65  During Hemodialysis Assessment  Blood Flow Rate (mL/min) 400 mL/min  Arterial Pressure (mmHg) 140 mmHg  Venous Pressure (mmHg) 180 mmHg  Transmembrane Pressure (mmHg) 60 mmHg  Ultrafiltration Rate (mL/min) 850 mL/min  Dialysate Flow Rate (mL/min) 600 ml/min  Conductivity: Machine  13.7  HD Safety Checks Performed Yes  Intra-Hemodialysis Comments Progressing as prescribed

## 2018-06-04 NOTE — Progress Notes (Signed)
UF on, pt stable   06/04/18 1315  Vital Signs  Pulse Rate 74  Pulse Rate Source Monitor  BP 101/65  BP Location Right Arm  BP Method Automatic  Patient Position (if appropriate) Lying  During Hemodialysis Assessment  Blood Flow Rate (mL/min) 400 mL/min  Arterial Pressure (mmHg) 140 mmHg  Venous Pressure (mmHg) 190 mmHg  Transmembrane Pressure (mmHg) 60 mmHg  Ultrafiltration Rate (mL/min) 850 mL/min  Dialysate Flow Rate (mL/min) 600 ml/min  Conductivity: Machine  13.7  HD Safety Checks Performed Yes  Intra-Hemodialysis Comments Progressing as prescribed

## 2018-06-04 NOTE — Progress Notes (Signed)
*  PRELIMINARY RESULTS* Echocardiogram 2D Echocardiogram has been performed.  Sherrie Sport 06/04/2018, 10:07 AM

## 2018-06-05 ENCOUNTER — Inpatient Hospital Stay: Payer: Medicare Other

## 2018-06-05 DIAGNOSIS — R079 Chest pain, unspecified: Secondary | ICD-10-CM | POA: Diagnosis not present

## 2018-06-05 LAB — CBC
HCT: 26.6 % — ABNORMAL LOW (ref 39.0–52.0)
Hemoglobin: 7.9 g/dL — ABNORMAL LOW (ref 13.0–17.0)
MCH: 26.8 pg (ref 26.0–34.0)
MCHC: 29.7 g/dL — ABNORMAL LOW (ref 30.0–36.0)
MCV: 90.2 fL (ref 80.0–100.0)
Platelets: 186 10*3/uL (ref 150–400)
RBC: 2.95 MIL/uL — ABNORMAL LOW (ref 4.22–5.81)
RDW: 18.7 % — ABNORMAL HIGH (ref 11.5–15.5)
WBC: 6.6 10*3/uL (ref 4.0–10.5)
nRBC: 0 % (ref 0.0–0.2)

## 2018-06-05 LAB — URINALYSIS, COMPLETE (UACMP) WITH MICROSCOPIC
Bilirubin Urine: NEGATIVE
Glucose, UA: 50 mg/dL — AB
Ketones, ur: NEGATIVE mg/dL
Nitrite: NEGATIVE
Protein, ur: 100 mg/dL — AB
RBC / HPF: >50 RBC/hpf — ABNORMAL HIGH (ref 0–5)
Specific Gravity, Urine: 1.011 (ref 1.005–1.030)
pH: 9 — ABNORMAL HIGH (ref 5.0–8.0)

## 2018-06-05 LAB — RENAL FUNCTION PANEL
Albumin: 2.8 g/dL — ABNORMAL LOW (ref 3.5–5.0)
Anion gap: 11 (ref 5–15)
BUN: 21 mg/dL (ref 8–23)
CO2: 29 mmol/L (ref 22–32)
Calcium: 8.1 mg/dL — ABNORMAL LOW (ref 8.9–10.3)
Chloride: 95 mmol/L — ABNORMAL LOW (ref 98–111)
Creatinine, Ser: 4.27 mg/dL — ABNORMAL HIGH (ref 0.61–1.24)
GFR calc Af Amer: 15 mL/min — ABNORMAL LOW (ref >60)
GFR calc non Af Amer: 13 mL/min — ABNORMAL LOW (ref >60)
Glucose, Bld: 112 mg/dL — ABNORMAL HIGH (ref 70–99)
Phosphorus: 3.3 mg/dL (ref 2.5–4.6)
Potassium: 3.8 mmol/L (ref 3.5–5.1)
Sodium: 135 mmol/L (ref 135–145)

## 2018-06-05 MED ORDER — ZOLPIDEM TARTRATE 5 MG PO TABS
5.0000 mg | ORAL_TABLET | Freq: Once | ORAL | Status: AC
  Administered 2018-06-05: 5 mg via ORAL
  Filled 2018-06-05: qty 1

## 2018-06-05 NOTE — Clinical Social Work Note (Signed)
PT worked with patient and are recommending home health, CSW updated case manager to discuss home health agencies.  CSW signing off.  Jones Broom. [place], MSW, [employer]  06/05/2018 2:59 PM

## 2018-06-05 NOTE — Progress Notes (Signed)
[facility]  ROUNDING NOTE   Subjective:   Daughter at bedside.   Hemodialysis treatment yesterday - tolerated treatment well.   Objective:  Vital signs in last 24 hours:  Temp:  [97.3 F (36.3 C)-99.1 F (37.3 C)] 97.3 F (36.3 C) (12/13 0825) Pulse Rate:  [70-89] 89 (12/13 0825) Resp:  [17] 17 (12/12 1938) BP: (101-127)/(55-73) 127/62 (12/13 0825) SpO2:  [95 %-100 %] 99 % (12/13 0825) Weight:  [74.5 kg] 74.5 kg (12/13 0326)  Weight change: -0.266 kg Filed Weights   06/04/18 0532 06/04/18 1220 06/05/18 0326  Weight: 77 kg 76.8 kg 74.5 kg    Intake/Output: I/O last 3 completed shifts: In: 360 [P.O.:360] Out: 0    Intake/Output this shift:  Total I/O In: -  Out: 10 [Urine:10]  Physical Exam: General: NAD,   Head: Normocephalic, atraumatic. Moist oral mucosal membranes  Eyes: Anicteric, PERRL  Neck: Supple, trachea midline  Lungs:  Clear to auscultation  Heart: Regular rate and rhythm  Abdomen:  Soft, nontender,   Extremities:  1+ peripheral edema.  Neurologic: Nonfocal, moving all four extremities  Skin: No lesions  Access: Left AVF    Basic Metabolic Panel: Recent Labs  Lab 06/03/18 2113 06/04/18 0326 06/04/18 1537  NA 135 136 134*  K 3.3* 3.8 3.3*  CL 92* 94* 91*  CO2 31 31 30   GLUCOSE 101* 82 80  BUN 19 21 25*  CREATININE 3.49* 3.93* 4.39*  CALCIUM 8.0* 7.9* 8.0*  PHOS  --   --  3.8    Liver Function Tests: Recent Labs  Lab 06/03/18 2113 06/04/18 1537  AST 45*  --   ALT 24  --   ALKPHOS 90  --   BILITOT 0.9  --   PROT 6.4*  --   ALBUMIN 3.0* 2.6*   No results for input(s): LIPASE, AMYLASE in the last 168 hours. No results for input(s): AMMONIA in the last 168 hours.  CBC: Recent Labs  Lab 06/03/18 2113 06/04/18 0326 06/04/18 1537  WBC 7.6 6.2 6.7  NEUTROABS  --   --  5.3  HGB 7.8* 7.6* 8.0*  HCT 26.1* 25.3* 26.9*  MCV 90.3 90.4 91.8  PLT 183 180 189    Cardiac Enzymes: Recent Labs  Lab 06/03/18 2113  06/04/18 0326 06/04/18 0917 06/04/18 1537  TROPONINI 0.34* 0.31* 0.32* 0.28*    BNP: Invalid input(s): POCBNP  CBG: No results for input(s): GLUCAP in the last 168 hours.  Microbiology: Results for orders placed or performed during the hospital encounter of 06/03/18  MRSA PCR Screening     Status: None   Collection Time: 06/04/18 12:35 AM  Result Value Ref Range Status   MRSA by PCR NEGATIVE NEGATIVE Final    Comment:        The GeneXpert MRSA Assay (FDA approved for NASAL specimens only), is one component of a comprehensive MRSA colonization surveillance program. It is not intended to diagnose MRSA infection nor to guide or monitor treatment for MRSA infections. Performed at [facility], [address] [place], [place] [address]     Coagulation Studies: No results for input(s): LABPROT, INR in the last 72 hours.  Urinalysis: Recent Labs    06/05/18 0826  COLORURINE YELLOW*  LABSPEC 1.011  PHURINE 9.0*  GLUCOSEU 50*  HGBUR MODERATE*  BILIRUBINUR NEGATIVE  KETONESUR NEGATIVE  PROTEINUR 100*  NITRITE NEGATIVE  LEUKOCYTESUR MODERATE*      Imaging: Dg Chest 2 View  Result Date: 06/03/2018 CLINICAL DATA:  Altered mental status and chest pressure. EXAM: CHEST - 2 VIEW COMPARISON:  05/25/2018; 04/06/2018; 08/29/2017; 04/17/202019 FINDINGS: Grossly unchanged cardiac silhouette and mediastinal contours with nodular prominence of the bilateral pulmonary hila. Post median sternotomy. Atherosclerotic plaque within thoracic aorta. Post aortic valve replacement. Unchanged small potentially partially loculated left-sided pleural effusion with associated left basilar consolidative opacities. No new focal airspace opacities. No evidence of edema. No pneumothorax. No acute osseus abnormalities. Post right-sided rotator cuff repair. IMPRESSION: 1. Stable examination without definitive superimposed acute cardiopulmonary disease. 2. Similar findings of chronic small  left-sided effusion and associated left basilar opacities, likely atelectasis or scar. Electronically Signed   By: Sandi Mariscal M.D.   On: 06/03/2018 21:34   Ct Head Wo Contrast  Result Date: 06/03/2018 CLINICAL DATA:  Altered mental status history of fall 2 days ago EXAM: CT HEAD WITHOUT CONTRAST TECHNIQUE: Contiguous axial images were obtained from the base of the skull through the vertex without intravenous contrast. COMPARISON:  MRI 07/24/2017, CT brain 04/24/2016 FINDINGS: Brain: No acute territorial infarction, hemorrhage or intracranial mass. Mild motion degradation. Moderate atrophy. Mild small vessel ischemic changes of the white matter Vascular: No hyperdense vessels.  Carotid vascular calcification. Skull: Normal. Negative for fracture or focal lesion. Sinuses/Orbits: No acute finding. Other: None IMPRESSION: 1. No CT evidence for acute intracranial abnormality. 2. Atrophy and mild small vessel ischemic changes of the white matter Electronically Signed   By: Donavan Foil M.D.   On: 06/03/2018 21:33   Ct Angio Chest Pe W And/or Wo Contrast  Result Date: 06/03/2018 CLINICAL DATA:  Chest pain anemia hemodialysis history of right knee replacement revision EXAM: CT ANGIOGRAPHY CHEST WITH CONTRAST TECHNIQUE: Multidetector CT imaging of the chest was performed using the standard protocol during bolus administration of intravenous contrast. Multiplanar CT image reconstructions and MIPs were obtained to evaluate the vascular anatomy. CONTRAST:  67mL OMNIPAQUE IOHEXOL 350 MG/ML SOLN COMPARISON:  Radiograph 06/03/2018, CT chest 07/25/2016 FINDINGS: Cardiovascular: Satisfactory opacification of the pulmonary arteries to the segmental level. No evidence for central or segmental filling defect. Under filling of distal subsegmental left lower lobe branch vessel. Status post CABG. Moderate aortic atherosclerosis. No aneurysm. Coronary vascular calcification. Cardiomegaly. Aortic replacement. No large  pericardial effusion. Mediastinum/Nodes: Midline trachea. No thyroid mass. No significant adenopathy. Esophagus within normal limits. Low density within the right axilla with peripheral enhancement, possible axillary nodes. Lungs/Pleura: Small moderate left pleural effusion. Stable small right upper lobe pulmonary nodule. Partial consolidation in the left lower lobe. Upper Abdomen: Multiple low-density lesions in the liver, some with calcification consistent with cysts. Probable cysts in the kidneys. Musculoskeletal: Degenerative changes. Post sternotomy changes. Suspected right scapular fracture, incompletely visualized. Diffuse soft tissue changes at the right shoulder joint may reflect effusion and soft tissue edema related to fracture. Review of the MIP images confirms the above findings. IMPRESSION: 1. No acute central embolus is seen. There is poor opacification of a distal subsegmental left lower lobe branch vessel, further evaluation limited by respiratory motion artifact and consolidated lung in the region. Small distal embolus in this region can not be excluded. 2. Cardiomegaly. Small moderate left pleural effusion. Consolidation in the left lower lobe may reflect atelectasis or pneumonia 3. Diffuse soft tissue changes about the right shoulder with possible incompletely visualized right scapular fracture. 4. Numerous liver cysts.  Probable renal cysts. Aortic Atherosclerosis (ICD10-I70.0). Electronically Signed   By: Donavan Foil M.D.   On: 06/03/2018 23:53   US Venous Img Lower Bilateral  Result Date:  06/05/2018 CLINICAL DATA:  History of DVT or pulmonary embolism EXAM: BILATERAL LOWER EXTREMITY VENOUS DUPLEX ULTRASOUND TECHNIQUE: Doppler venous assessment of the bilateral lower extremity deep venous system was performed, including characterization of spectral flow, compressibility, and plasticity. COMPARISON:  None. FINDINGS: There is complete compressibility of the bilateral common femoral,  femoral, and popliteal veins. Doppler analysis demonstrates respiratory phasicity and augmentation of flow with calf compression. No obvious superficial vein or calf vein thrombosis. IMPRESSION: No evidence of lower extremity DVT. Electronically Signed   By: Marybelle Killings M.D.   On: 06/05/2018 08:14     Medications:    . cinacalcet  60 mg Oral Q breakfast  . clopidogrel  75 mg Oral Daily  . heparin  5,000 Units Subcutaneous Q8H  . rosuvastatin  10 mg Oral Daily  . sevelamer carbonate  1,600 mg Oral With snacks  . sevelamer carbonate  2,400 mg Oral TID WC   acetaminophen **OR** acetaminophen, diphenhydrAMINE, nitroGLYCERIN, ondansetron **OR** ondansetron (ZOFRAN) IV, senna-docusate, traMADol  Assessment/ Plan:  [name] is a 77 y.o. white male 77 y.o. white male with ESRD, hypothyroidism, hypertension, BPH, CAD status post CABG, hyperlipidemia, right total knee revision arthroplasty May 22, 2018 Dr. Marry Guan  [facility] [address]/MWF  1.  End-stage renal disease: extra hemodialysis treatment yesterday. UF of 2 liters - Treatment for today. Orders prepared. MWF schedule.   2.  Anemia of chronic kidney disease Continue mircera as outpatient.   3.  Secondary hyperparathyroidism Continue Renvela with meals.   4. Hypertension: blood pressure at goal.    LOS: 2 Livy Ross 12/13/20191:06 PM

## 2018-06-05 NOTE — Progress Notes (Signed)
Post HD assessment. Pt tolerated tx well without c/o or complication. Net UF 2010, goal met.    06/05/18 1922  Vital Signs  Temp 97.6 F (36.4 C)  Temp Source Oral  Pulse Rate 74  Pulse Rate Source Monitor  Resp (!) 23  BP 125/61  BP Location Right Arm  BP Method Automatic  Patient Position (if appropriate) Lying  Oxygen Therapy  SpO2 100 %  O2 Device Room Air  Dialysis Weight  Weight 75.7 kg  Type of Weight Post-Dialysis  Post-Hemodialysis Assessment  Rinseback Volume (mL) 250 mL  KECN 57.8 V  Dialyzer Clearance Lightly streaked  Duration of HD Treatment -hour(s) 3 hour(s)  Hemodialysis Intake (mL) 500 mL  UF Total -Machine (mL) 2510 mL  Net UF (mL) 2010 mL  Tolerated HD Treatment Yes  AVG/AVF Arterial Site Held (minutes) 10 minutes  AVG/AVF Venous Site Held (minutes) 10 minutes  Education / Care Plan  Dialysis Education Provided Yes  Documented Education in Care Plan Yes  Fistula / Graft Left Upper arm Arteriovenous fistula  No Placement Date or Time found.   Orientation: Left  Access Location: Upper arm  Access Type: Arteriovenous fistula  Site Condition No complications  Fistula / Graft Assessment Present;Thrill;Bruit  Status Deaccessed  Drainage Description None

## 2018-06-05 NOTE — Discharge Summary (Signed)
[facility] [place] NAME: Jimmy Carson    MR#:  [id number]  DATE OF BIRTH:  1940-07-21  DATE OF ADMISSION:  06/03/2018   ADMITTING PHYSICIAN: Lance Coon, MD  DATE OF DISCHARGE:  06/05/2018 PRIMARY CARE PHYSICIAN: Dion Body, MD   ADMISSION DIAGNOSIS:  Chest pain, unspecified type [R07.9] DISCHARGE DIAGNOSIS:  Principal Problem:   Chest pain Active Problems:   HTN (hypertension)   CAD (coronary artery disease)   HLD (hyperlipidemia)   ESRD on hemodialysis ([place])  SECONDARY DIAGNOSIS:   Past Medical History:  Diagnosis Date  . Anemia   . CAD (coronary artery disease)    s/p percutaneous coronary angioplasty, s/p CABG x 6   . ESRD (end stage renal disease) ([place])   . Heart attack ([place])   . History of radiation therapy    for prostate CA  . Hyperlipidemia   . Hyperparathyroidism, secondary renal ([place])   . Hypertension   . Osteoarthritis   . Polycystic kidney disease   . Prostate CA [facility]) 2012   Brachytherapy  . Renal insufficiency    currently on dialysis  . Shingles    HOSPITAL COURSE:   Chest pain with elevated troponin.  History of CAD. Likely demand ischemia in the setting of end stage renal disease on dialysis.  Defer cardiac catheterization, continue Plavix and nitro PRN per Dr. Ubaldo Glassing. Echo: EF: 25% - 30%.  Acute metabolic encephalopathy.  possible due to tramadol which is discontinued. CT head is unremarkable. The patient is AAOx3, but according to POA, this is not his baseline.  The patient has not sleep well for 3 days.   He does not have any focal neurologic deficit.  HTN (hypertension) -home dose antihypertensives CAD (coronary artery disease) -continue home meds.  ESRD on hemodialysis.  Needed dialysis today per Dr. Juleen China.  Skin itch.  Possible due to ESRD.  Benadryl as needed. Improved.  HLD (hyperlipidemia) -continue Crestor.  Anemia of chronic disease.   Stable. Hypokalemia.  Adjust during hemodialysis.  Generalized weakness: HHPT. HIs daughtger (POA) will take him home. Discussed with Dr. Juleen China. DISCHARGE CONDITIONS:  Stable, discharge to home with HHPT. CONSULTS OBTAINED:  Treatment Team:  Teodoro Spray, MD Lavonia Dana, MD DRUG ALLERGIES:   Allergies  Allergen Reactions  . Orange Fruit [Citrus] Hives  . Penicillins Hives and Other (See Comments)    Has patient had a PCN reaction causing immediate rash, facial/tongue/throat swelling, SOB or lightheadedness with hypotension: Yes Has patient had a PCN reaction causing severe rash involving mucus membranes or skin necrosis: No Has patient had a PCN reaction that required hospitalization No Has patient had a PCN reaction occurring within the last 10 years: No If all of the above answers are "NO", then may proceed with Cephalosporin use.    DISCHARGE MEDICATIONS:   Allergies as of 06/05/2018      Reactions   Orange Fruit [citrus] Hives   Penicillins Hives, Other (See Comments)   Has patient had a PCN reaction causing immediate rash, facial/tongue/throat swelling, SOB or lightheadedness with hypotension: Yes Has patient had a PCN reaction causing severe rash involving mucus membranes or skin necrosis: No Has patient had a PCN reaction that required hospitalization No Has patient had a PCN reaction occurring within the last 10 years: No If all of the above answers are "NO", then may proceed with Cephalosporin use.      Medication List    STOP taking these medications  oxyCODONE 5 MG immediate release tablet Commonly known as:  Oxy IR/ROXICODONE   traMADol 50 MG tablet Commonly known as:  ULTRAM     TAKE these medications   acetaminophen 500 MG tablet Commonly known as:  TYLENOL Take 1,000 mg by mouth every 6 (six) hours as needed for mild pain or headache.   cinacalcet 60 MG tablet Commonly known as:  SENSIPAR Take 60 mg by mouth daily.   clopidogrel 75 MG  tablet Commonly known as:  PLAVIX Take 75 mg by mouth daily.   isosorbide mononitrate 60 MG 24 hr tablet Commonly known as:  IMDUR Take 60 mg by mouth daily.   nitroGLYCERIN 0.4 MG SL tablet Commonly known as:  NITROSTAT Place 1 tablet (0.4 mg total) under the tongue every 5 (five) minutes as needed for chest pain.   pyridOXINE 100 MG tablet Commonly known as:  VITAMIN B-6 Take 100 mg by mouth daily.   rosuvastatin 10 MG tablet Commonly known as:  CRESTOR Take 10 mg by mouth daily.   sevelamer carbonate 800 MG tablet Commonly known as:  RENVELA Take 1,600-2,400 mg by mouth See admin instructions. Take 2400 mg by mouth 3 times daily with meals and take 1600 mg by mouth with snacks   tamsulosin 0.4 MG Caps capsule Commonly known as:  FLOMAX Take 0.4 mg by mouth daily.   traZODone 50 MG tablet Commonly known as:  DESYREL Take 50 mg by mouth at bedtime.        DISCHARGE INSTRUCTIONS:  See AVS.  If you experience worsening of your admission symptoms, develop shortness of breath, life threatening emergency, suicidal or homicidal thoughts you must seek medical attention immediately by calling 911 or calling your MD immediately  if symptoms less severe.  You Must read complete instructions/literature along with all the possible adverse reactions/side effects for all the Medicines you take and that have been prescribed to you. Take any new Medicines after you have completely understood and accpet all the possible adverse reactions/side effects.   Please note  You were cared for by a hospitalist during your hospital stay. If you have any questions about your discharge medications or the care you received while you were in the hospital after you are discharged, you can call the unit and asked to speak with the hospitalist on call if the hospitalist that took care of you is not available. Once you are discharged, your primary care physician will handle any further medical issues.  Please note that NO REFILLS for any discharge medications will be authorized once you are discharged, as it is imperative that you return to your primary care physician (or establish a relationship with a primary care physician if you do not have one) for your aftercare needs so that they can reassess your need for medications and monitor your lab values.    On the day of Discharge:  VITAL SIGNS:  Blood pressure 114/71, pulse 69, temperature 98.3 F (36.8 C), temperature source Oral, resp. rate 18, height 5\' 10"  (1.778 m), weight 74.5 kg, SpO2 100 %. PHYSICAL EXAMINATION:  GENERAL:  77 y.o.-year-old patient lying in the bed with no acute distress.  EYES: Pupils equal, round, reactive to light and accommodation. No scleral icterus. Extraocular muscles intact.  HEENT: Head atraumatic, normocephalic. Oropharynx and nasopharynx clear.  NECK:  Supple, no jugular venous distention. No thyroid enlargement, no tenderness.  LUNGS: Normal breath sounds bilaterally, no wheezing, rales,rhonchi or crepitation. No use of accessory muscles of respiration.  CARDIOVASCULAR:  S1, S2 normal. No murmurs, rubs, or gallops.  ABDOMEN: Soft, non-tender, non-distended. Bowel sounds present. No organomegaly or mass.  EXTREMITIES: No pedal edema, cyanosis, or clubbing.  NEUROLOGIC: Cranial nerves II through XII are intact. Muscle strength 5/5 in all extremities. Sensation intact. Gait not checked.  PSYCHIATRIC: The patient is alert and oriented x 3.  SKIN: No obvious rash, lesion, or ulcer.  DATA REVIEW:   CBC Recent Labs  Lab 06/05/18 1616  WBC 6.6  HGB 7.9*  HCT 26.6*  PLT 186    Chemistries  Recent Labs  Lab 06/03/18 2113  06/05/18 1616  NA 135   < > 135  K 3.3*   < > 3.8  CL 92*   < > 95*  CO2 31   < > 29  GLUCOSE 101*   < > 112*  BUN 19   < > 21  CREATININE 3.49*   < > 4.27*  CALCIUM 8.0*   < > 8.1*  AST 45*  --   --   ALT 24  --   --   ALKPHOS 90  --   --   BILITOT 0.9  --   --    < > =  values in this interval not displayed.     Microbiology Results  Results for orders placed or performed during the hospital encounter of 06/03/18  MRSA PCR Screening     Status: None   Collection Time: 06/04/18 12:35 AM  Result Value Ref Range Status   MRSA by PCR NEGATIVE NEGATIVE Final    Comment:        The GeneXpert MRSA Assay (FDA approved for NASAL specimens only), is one component of a comprehensive MRSA colonization surveillance program. It is not intended to diagnose MRSA infection nor to guide or monitor treatment for MRSA infections. Performed at [facility], [address] [place], [place] [address]     RADIOLOGY:  US Venous Img Lower Bilateral  Result Date: 06/05/2018 CLINICAL DATA:  History of DVT or pulmonary embolism EXAM: BILATERAL LOWER EXTREMITY VENOUS DUPLEX ULTRASOUND TECHNIQUE: Doppler venous assessment of the bilateral lower extremity deep venous system was performed, including characterization of spectral flow, compressibility, and plasticity. COMPARISON:  None. FINDINGS: There is complete compressibility of the bilateral common femoral, femoral, and popliteal veins. Doppler analysis demonstrates respiratory phasicity and augmentation of flow with calf compression. No obvious superficial vein or calf vein thrombosis. IMPRESSION: No evidence of lower extremity DVT. Electronically Signed   By: Marybelle Killings M.D.   On: 06/05/2018 08:14     Management plans discussed with the patient, his daughter and they are in agreement.  CODE STATUS: Full Code   TOTAL TIME TAKING CARE OF THIS PATIENT: 38 minutes.    Demetrios Loll M.D on 06/05/2018 at 5:34 PM  Between 7am to 6pm - Pager - [phone]  After 6pm go to [email or web address] - Proofreader  [facility]  Office  [phone]  CC: Primary care physician; Dion Body, MD   Note: This dictation was prepared with Dragon dictation along with smaller phrase  technology. Any transcriptional errors that result from this process are unintentional.

## 2018-06-05 NOTE — Progress Notes (Signed)
Post HD assessment     06/05/18 1920  Neurological  Level of Consciousness Alert  Orientation Level Oriented X4  Respiratory  Respiratory Pattern Regular;Unlabored  Chest Assessment Chest expansion symmetrical  Cardiac  ECG Monitor Yes  Vascular  R Radial Pulse +2  L Radial Pulse +2  Edema Generalized;Right lower extremity;Left lower extremity  Integumentary  Integumentary (WDL) X  Skin Color Appropriate for ethnicity  Musculoskeletal  Musculoskeletal (WDL) X  Generalized Weakness Yes  Assistive Device None  GU Assessment  Genitourinary (WDL) X  Genitourinary Symptoms  (HD)  Psychosocial  Psychosocial (WDL) X  Patient Behaviors Anxious;Irritable;Restless;Uncooperative

## 2018-06-05 NOTE — Progress Notes (Signed)
Hd started  

## 2018-06-05 NOTE — Care Management Obs Status (Signed)
[facility] NOTIFICATION   Patient Details  Name: Jimmy Carson MRN: [id number] Date of Birth: August 09, 1940   Medicare Observation Status Notification Given:  Yes    Elza Rafter, RN 06/05/2018, 9:44 AM

## 2018-06-05 NOTE — Progress Notes (Signed)
[facility] [place] NAME: Jimmy Carson    MR#:  [id number]  DATE OF BIRTH:  1941/02/18  SUBJECTIVE:  CHIEF COMPLAINT:   Chief Complaint  Patient presents with  . Altered Mental Status  . Chest Pain   Patient has no complaints. REVIEW OF SYSTEMS:  Review of Systems  Constitutional: Negative for chills, fever and malaise/fatigue.  HENT: Negative for sore throat.   Eyes: Negative for blurred vision and double vision.  Respiratory: Negative for cough, hemoptysis, shortness of breath, wheezing and stridor.   Cardiovascular: Negative for chest pain, palpitations, orthopnea and leg swelling.  Gastrointestinal: Negative for abdominal pain, blood in stool, diarrhea, melena, nausea and vomiting.  Genitourinary: Negative for dysuria, flank pain and hematuria.  Musculoskeletal: Negative for back pain and joint pain.  Skin: Negative for itching and rash.  Neurological: Negative for dizziness, sensory change, focal weakness, seizures, loss of consciousness, weakness and headaches.  Endo/Heme/Allergies: Negative for polydipsia.  Psychiatric/Behavioral: Negative for depression. The patient is not nervous/anxious.     DRUG ALLERGIES:   Allergies  Allergen Reactions  . Orange Fruit [Citrus] Hives  . Penicillins Hives and Other (See Comments)    Has patient had a PCN reaction causing immediate rash, facial/tongue/throat swelling, SOB or lightheadedness with hypotension: Yes Has patient had a PCN reaction causing severe rash involving mucus membranes or skin necrosis: No Has patient had a PCN reaction that required hospitalization No Has patient had a PCN reaction occurring within the last 10 years: No If all of the above answers are "NO", then may proceed with Cephalosporin use.    VITALS:  Blood pressure 115/66, pulse 82, temperature 98.3 F (36.8 C), temperature source Oral, resp. rate 17, height 5\' 10"  (1.778 m), weight 74.5 kg,  SpO2 100 %. PHYSICAL EXAMINATION:  Physical Exam Constitutional:      General: He is not in acute distress. HENT:     Head: Normocephalic.     Mouth/Throat:     Mouth: Mucous membranes are moist.  Eyes:     General: No scleral icterus.    Extraocular Movements: Extraocular movements intact.     Conjunctiva/sclera: Conjunctivae normal.  Neck:     Musculoskeletal: Normal range of motion and neck supple.     Vascular: No JVD.     Trachea: No tracheal deviation.  Cardiovascular:     Rate and Rhythm: Normal rate and regular rhythm.     Heart sounds: Normal heart sounds. No murmur. No gallop.   Pulmonary:     Effort: Pulmonary effort is normal. No respiratory distress.     Breath sounds: Normal breath sounds. No wheezing or rales.  Abdominal:     General: Bowel sounds are normal. There is no distension.     Palpations: Abdomen is soft.     Tenderness: There is no abdominal tenderness. There is no rebound.  Musculoskeletal: Normal range of motion.        General: No tenderness.     Right lower leg: Edema present.     Left lower leg: Edema present.  Skin:    Findings: No erythema or rash.     Comments: Scratches on back, arm and legs  Neurological:     General: No focal deficit present.     Mental Status: He is alert and oriented to person, place, and time.     Cranial Nerves: No cranial nerve deficit.    LABORATORY PANEL:  Male CBC Recent Labs  Lab 06/05/18 1616  WBC 6.6  HGB 7.9*  HCT 26.6*  PLT 186   ------------------------------------------------------------------------------------------------------------------ Chemistries  Recent Labs  Lab 06/03/18 2113  06/05/18 1616  NA 135   < > 135  K 3.3*   < > 3.8  CL 92*   < > 95*  CO2 31   < > 29  GLUCOSE 101*   < > 112*  BUN 19   < > 21  CREATININE 3.49*   < > 4.27*  CALCIUM 8.0*   < > 8.1*  AST 45*  --   --   ALT 24  --   --   ALKPHOS 90  --   --   BILITOT 0.9  --   --    < > = values in this interval not  displayed.   RADIOLOGY:  US Venous Img Lower Bilateral  Result Date: 06/05/2018 CLINICAL DATA:  History of DVT or pulmonary embolism EXAM: BILATERAL LOWER EXTREMITY VENOUS DUPLEX ULTRASOUND TECHNIQUE: Doppler venous assessment of the bilateral lower extremity deep venous system was performed, including characterization of spectral flow, compressibility, and plasticity. COMPARISON:  None. FINDINGS: There is complete compressibility of the bilateral common femoral, femoral, and popliteal veins. Doppler analysis demonstrates respiratory phasicity and augmentation of flow with calf compression. No obvious superficial vein or calf vein thrombosis. IMPRESSION: No evidence of lower extremity DVT. Electronically Signed   By: Marybelle Killings M.D.   On: 06/05/2018 08:14   ASSESSMENT AND PLAN:   Chest painwith elevated troponin.History of CAD. Likely demand ischemia in the setting of end stage renal disease on dialysis.Defer cardiac catheterization, continue Plavix and nitro PRN per Dr. Ubaldo Glassing. Echo:EF: 25% - 30%.  Acute metabolic encephalopathy.possible due to tramadol which is discontinued. CT head is unremarkable. Thepatient isAAOx3,but according to POA, this is not his baseline.The patient has not sleep well for 3 days.  He does not have any focal neurologic deficit.  HTN (hypertension) -home dose antihypertensives CAD (coronary artery disease) -continue home meds.  ESRD on hemodialysis.Needed dialysis today per Dr. Juleen China.  Skinitch.Possible due to ESRD. Benadryl as needed. Improved.  HLD (hyperlipidemia) -continue Crestor.  Anemia of chronic disease. Stable. Hypokalemia. Adjust during hemodialysis.  Generalized weakness: HHPT. HIs daughtger (POA) will take him home. Discussed with Dr. Juleen China. All the records are reviewed and case discussed with Care Management/Social Worker. Management plans discussed with the patient, his son and POA and they are  in agreement.  CODE STATUS: Full Code  TOTAL TIME TAKING CARE OF THIS PATIENT: 35 minutes.   More than 50% of the time was spent in counseling/coordination of care: YES  POSSIBLE D/C IN 1 DAYS, DEPENDING ON CLINICAL CONDITION.   Demetrios Loll M.D on 06/05/2018 at 7:07 PM  Between 7am to 6pm - Pager - [phone]  After 6pm go to [email or web address] - [facility]

## 2018-06-05 NOTE — Evaluation (Signed)
Physical Therapy Evaluation Patient Details Name: Jimmy Carson MRN: [id number] DOB: 24-Nov-1940 Today's Date: 06/05/2018   History of Present Illness  Pt is a 77 y.o. male who presented with chief complaint of AMS and chest pain.  Patient presented [facility] from [facility] with a complaint of chest discomfort.  He had a fall a couple of days ago, and then afterwards developed some chest discomfort over the past couple of days.  He had some nausea with vomiting per his report.  He stated that his discomfort has not felt like his prior heart attacks, and that it is not pressure-like, but he could not clarify or characterize his discomfort any further than that.  Work-up in the ED showed elevated troponin, though this is in the setting of end-stage renal disease on dialysis.  Patient has significant prior cardiac history with multiple bypass surgeries and aortic valve replacement.  Hospitalist were called for further evaluation and treatment.  Assessment includes: chest pain with elevated troponin likely demand ischemia in the setting of ESRD, acute metabolic encephalopathy, CAD, Recent R TKA revision, ESRD on HD, anemia of chronic disease, and HTN.     Clinical Impression  Pt presents with minor deficits in strength, transfers, gait, R knee ROM, and activity tolerance but overall performed very well during the session.  Pt was Ind with bed mobility tasks with good speed and effort.  Pt was able to stand and sit with good eccentric and concentric control and stability.  Pt was able to amb over 100 feet with a RW with good stability including during start/stops and 90 deg turns.  Pt reported no R knee pain at rest and only 1/10 R knee pain after therex and ambulation.  Pt will benefit from HHPT services upon discharge to safely address above deficits for decreased caregiver assistance and eventual return to PLOF.        Follow Up Recommendations Home health PT;Supervision/Assistance - 24  hour    Equipment Recommendations  Other (comment)(Family interested in tub transfer bench vs shower chair)    Recommendations for Other Services       Precautions / Restrictions Precautions Precautions: Fall Restrictions Weight Bearing Restrictions: Yes RLE Weight Bearing: Weight bearing as tolerated      Mobility  Bed Mobility Overal bed mobility: Independent             General bed mobility comments: Good speed and effort in and out of bed  Transfers Overall transfer level: Needs assistance Equipment used: Rolling walker (2 wheeled) Transfers: Sit to/from Stand Sit to Stand: Supervision         General transfer comment: Good eccentric and concentric control and stability with transfers  Ambulation/Gait Ambulation/Gait assistance: Supervision Gait Distance (Feet): 125 Feet Assistive device: Rolling walker (2 wheeled) Gait Pattern/deviations: Step-through pattern;Decreased step length - right;Decreased step length - left Gait velocity: decreased   General Gait Details: Pt steady with amb with good cadence; good stability during start/stops and sharp turns  Financial trader Rankin (Stroke Patients Only)       Balance Overall balance assessment: Needs assistance Sitting-balance support: Bilateral upper extremity supported;Feet supported Sitting balance-Leahy Scale: Good     Standing balance support: Bilateral upper extremity supported Standing balance-Leahy Scale: Good  Pertinent Vitals/Pain Pain Assessment: No/denies pain    Home Living Family/patient expects to be discharged to:: Private residence Living Arrangements: Alone Available Help at Discharge: Family;Available 24 hours/day Type of Home: House Home Access: Stairs to enter Entrance Stairs-Rails: None Entrance Stairs-Number of Steps: 1 step to a small landing big enough for a RW then another 1 step into  the house Home Layout: One level Home Equipment: Other (comment)(Family interested in options for tub transfer bench vs shower chair)      Prior Function Level of Independence: Independent with assistive device(s)         Comments: Mod Ind ambulation with a SPC in the community and without an AD in the home, no fall hx; Ind with ADLs; daughter endorses some instability during amb in the home but no falls     Hand Dominance        Extremity/Trunk Assessment   Upper Extremity Assessment Upper Extremity Assessment: Overall WFL for tasks assessed    Lower Extremity Assessment Lower Extremity Assessment: Generalized weakness       Communication   Communication: No difficulties  Cognition Arousal/Alertness: Awake/alert Behavior During Therapy: Flat affect Overall Cognitive Status: Within Functional Limits for tasks assessed                                        General Comments      Exercises Total Joint Exercises Ankle Circles/Pumps: AROM;Both;15 reps Quad Sets: Strengthening;Both;20 reps Heel Slides: AROM;Both;10 reps Hip ABduction/ADduction: AROM;10 reps;Both Long Arc Quad: AROM;10 reps;Both Knee Flexion: AROM;10 reps;Both Goniometric ROM: R knee AROM: 2-95 deg Marching in Standing: AROM;Both;10 reps;Standing   Assessment/Plan    PT Assessment Patient needs continued PT services  PT Problem List Decreased strength;Decreased range of motion;Decreased activity tolerance       PT Treatment Interventions DME instruction;Gait training;Stair training;Functional mobility training;Therapeutic activities;Therapeutic exercise;Balance training;Patient/family education    PT Goals (Current goals can be found in the Care Plan section)  Acute Rehab PT Goals Patient Stated Goal: To get back home    Frequency 7X/week   Barriers to discharge        Co-evaluation               AM-PAC PT "6 Clicks" Mobility  Outcome Measure Help needed  turning from your back to your side while in a flat bed without using bedrails?: None Help needed moving from lying on your back to sitting on the side of a flat bed without using bedrails?: None Help needed moving to and from a bed to a chair (including a wheelchair)?: None Help needed standing up from a chair using your arms (e.g., wheelchair or bedside chair)?: None Help needed to walk in hospital room?: A Little Help needed climbing 3-5 steps with a railing? : A Little 6 Click Score: 22    End of Session Equipment Utilized During Treatment: Gait belt Activity Tolerance: Patient tolerated treatment well Patient left: in bed;with family/visitor present;with bed alarm set;with call bell/phone within reach Nurse Communication: Mobility status PT Visit Diagnosis: Muscle weakness (generalized) (M62.81);Difficulty in walking, not elsewhere classified (R26.2)    Time: 0254-2706 PT Time Calculation (min) (ACUTE ONLY): 25 min   Charges:   PT Evaluation $PT Eval Low Complexity: 1 Low PT Treatments $Therapeutic Exercise: 8-22 mins        D. Scott Sequoyah Ramone PT, DPT 06/05/18, 3:05 PM

## 2018-06-05 NOTE — Progress Notes (Signed)
Pre dialysis assessment 

## 2018-06-05 NOTE — Care Management Note (Signed)
Case Management Note  Patient Details  Name: Jimmy Carson MRN: [id number] Date of Birth: [birth year]  Subjective/Objective:       Patient is now planning on discharging to home with home health.  Daughter will stay with him for a while and eventually move him to [place].  RNCM to patient room to present list of home health agencies with ratings.  Left list at patient bedside.             Action/Plan:   Expected Discharge Date:  06/05/18               Expected Discharge Plan:  [place]  In-House Referral:     Discharge planning Services  CM Consult  Post Acute Care Choice:  Home Health Choice offered to:  Patient, Adult Children  DME Arranged:    DME Agency:     HH Arranged:  RN, PT, OT, Nurse's Aide, Social Work [employer] Agency:     Status of Service:  In process, will continue to follow  If discussed at Long Length of Stay Meetings, dates discussed:    Additional Comments:  Elza Rafter, RN 06/05/2018, 4:37 PM

## 2018-06-05 NOTE — NC FL2 (Signed)
[place] LEVEL OF CARE SCREENING TOOL     IDENTIFICATION  Patient Name: Jimmy Carson Birthdate: [birth year] Sex: male Admission Date (Current Location): 06/03/2018  [place] and [place] Number:  Engineering geologist and Address:  [facility], [address], [place], [place] [address]      Provider Number: [id number]  Attending Physician Name and Address:  Demetrios Loll, MD  Relative Name and Phone Number:     Matthias Hughs Daughter [place]) Denman George   [phone]      Current Level of Care: Hospital Recommended Level of Care: [place] Prior Approval Number:    Date Approved/Denied:   PASRR Number: [id number]  Discharge Plan: SNF    Current Diagnoses: Patient Active Problem List   Diagnosis Date Noted  . History of revision of total knee arthroplasty 127-Jun-202019  . CAD S/P percutaneous coronary angioplasty 05/15/2018  . ESRD on hemodialysis ([place]) 05/15/2018  . Insomnia 05/15/2018  . Pure hypercholesterolemia 05/15/2018  . Pleural effusion on left 08/30/2017  . HTN (hypertension) 08/30/2017  . CAD (coronary artery disease) 08/30/2017  . HLD (hyperlipidemia) 08/30/2017  . 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 Pulmonary emphysema ([place]) 07/18/2016  . Other specified complication of vascular prosthetic devices, implants and grafts, initial encounter ([place]) 05/08/2016  . Complete tear of left rotator cuff 01/12/2016  . Rotator cuff arthropathy, left 01/12/2016  . Rotator cuff arthropathy, right 01/12/2016  . NSTEMI (non-ST elevated myocardial infarction) ([place]) 07/22/2015  . Chest pain 07/21/2015  . Non-rheumatic mitral regurgitation 07/18/2015  . History of prostate cancer 12/02/2014  . Lymphopenia 12/02/2014  . 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 S/P CABG x 6 02/16/2014  . Discitis 02/11/2013  . Anemia  of chronic disease 12/04/2012  . Hyperparathyroidism, secondary renal ([place]) 12/04/2012  . Congenital polycystic kidney 12/03/2012  . PKD (polycystic kidney disease) 12/03/2012    Orientation RESPIRATION BLADDER Height & Weight     Self, Time, Situation, Place  Normal Continent Weight: 164 lb 4.8 oz (74.5 kg) Height:  5\' 10"  (177.8 cm)  BEHAVIORAL SYMPTOMS/MOOD NEUROLOGICAL BOWEL NUTRITION STATUS      Continent Diet(Renal diet)  AMBULATORY STATUS COMMUNICATION OF NEEDS Skin   Limited Assist Verbally Surgical wounds                       Personal Care Assistance Level of Assistance  Bathing, Feeding, Dressing Bathing Assistance: Limited assistance Feeding assistance: Limited assistance Dressing Assistance: Limited assistance     Functional Limitations Info  Sight, Hearing, Speech Sight Info: Adequate Hearing Info: Adequate Speech Info: Adequate    SPECIAL CARE FACTORS FREQUENCY  PT (By licensed PT), OT (By licensed OT)     PT Frequency: 5x a week OT Frequency: 5x a week            Contractures Contractures Info: Not present    Additional Factors Info  Code Status, Allergies Code Status Info: Full Code Allergies Info: ORANGE FRUIT CITRUS, PENICILLINS            Current Medications (06/05/2018):  This is the current hospital active medication list Current Facility-Administered Medications  Medication Dose Route Frequency Provider Last Rate Last Dose  . acetaminophen (TYLENOL) tablet 650 mg  650 mg Oral Q6H PRN Lance Coon, MD   650 mg at 06/05/18 0033   Or  . acetaminophen (TYLENOL) suppository 650 mg  650 mg  Rectal Q6H PRN Lance Coon, MD      . cinacalcet [facility]) tablet 60 mg  60 mg Oral Q breakfast Lance Coon, MD   60 mg at 06/05/18 0856  . clopidogrel (PLAVIX) tablet 75 mg  75 mg Oral Daily Lance Coon, MD   75 mg at 06/05/18 0857  . diphenhydrAMINE (BENADRYL) capsule 25 mg  25 mg Oral Q6H PRN Demetrios Loll, MD      . heparin injection 5,000  Units  5,000 Units Subcutaneous Driscilla Moats, MD   5,000 Units at 06/05/18 0537  . nitroGLYCERIN (NITROSTAT) SL tablet 0.4 mg  0.4 mg Sublingual Q5 min PRN Lance Coon, MD      . ondansetron [facility] [place] [facility]) tablet 4 mg  4 mg Oral Q6H PRN Lance Coon, MD       Or  . ondansetron [facility]) injection 4 mg  4 mg Intravenous Q6H PRN Lance Coon, MD      . rosuvastatin (CRESTOR) tablet 10 mg  10 mg Oral Daily Lance Coon, MD   10 mg at 06/05/18 0856  . senna-docusate (Senokot-S) tablet 1 tablet  1 tablet Oral QHS PRN Lance Coon, MD      . sevelamer carbonate (RENVELA) tablet 1,600 mg  1,600 mg Oral With snacks Lance Coon, MD      . sevelamer carbonate (RENVELA) tablet 2,400 mg  2,400 mg Oral TID WC Lance Coon, MD   2,400 mg at 06/05/18 0857  . traMADol (ULTRAM) tablet 50 mg  50 mg Oral Q6H PRN Lance Coon, MD         Discharge Medications: Please see discharge summary for a list of discharge medications.  Relevant Imaging Results:  Relevant Lab Results:   Additional Information SSN [id number]  Ross Ludwig, [place]

## 2018-06-05 NOTE — Progress Notes (Signed)
HD tx end    06/05/18 1914  Vital Signs  Pulse Rate 81  Pulse Rate Source Monitor  Resp 20  BP (!) 146/54  BP Location Right Arm  BP Method Automatic  Patient Position (if appropriate) Lying  Oxygen Therapy  SpO2 100 %  O2 Device Room Air  During Hemodialysis Assessment  Dialysis Fluid Bolus Normal Saline  Bolus Amount (mL) 250 mL  Intra-Hemodialysis Comments Tx completed

## 2018-06-05 NOTE — Plan of Care (Signed)
  Problem: Health Behavior/Discharge Planning: Goal: Ability to manage health-related needs will improve Outcome: Not Progressing Note:  Pt keep refusing meds and test but was educated about the importance of it.   Problem: Safety: Goal: Ability to remain free from injury will improve Outcome: Not Progressing Note:  Pt keep getting up in the bed. A sitter is requested

## 2018-06-05 NOTE — Care Management Important Message (Signed)
Important Message  Patient Details  Name: Jimmy Carson MRN: [id number] Date of Birth: [birth year]   Medicare Important Message Given:  Yes    Juliann Pulse A Lucinda Spells 06/05/2018, 11:41 AM

## 2018-06-05 NOTE — Discharge Instructions (Signed)
-   Fall precaution 

## 2018-06-05 NOTE — Care Management CC44 (Signed)
Condition Code 44 Documentation Completed  Patient Details  Name: Baird Polinski MRN: [id number] Date of Birth: 1940-08-13   Condition Code 44 given:  Yes Patient signature on Condition Code 44 notice:  Yes Documentation of 2 MD's agreement:  Yes Code 44 added to claim:  Yes    Elza Rafter, RN 06/05/2018, 9:44 AM

## 2018-06-06 DIAGNOSIS — R079 Chest pain, unspecified: Secondary | ICD-10-CM | POA: Diagnosis not present

## 2018-06-06 NOTE — Care Management Note (Signed)
Case Management Note  Patient Details  Name: Jimmy Carson MRN: [id number] Date of Birth: 06-21-1941  Subjective/Objective:    Patient to be discharged per MD order. Orders in place for home health services. Patient thinks he would ultimately benefit more from outpatient Pt than home health PT. Patients preference is to set up home health and depending how well he progresses me may seek outpatient PT through [facility] next week. He has all the needed DME and refuses home health nursing and only has a need for PT per him. CMS Medicare.gov Compare Post Acute Care list reviewed with [facility]. Family to transport./                   Action/Plan:   Expected Discharge Date:  06/05/18               Expected Discharge Plan:  [place]  In-House Referral:     Discharge planning Services  CM Consult  Post Acute Care Choice:  Home Health Choice offered to:  Patient, Adult Children  DME Arranged:    DME Agency:     HH Arranged:  PT, Social Work [employer] Agency:  [place]  Status of Service:  Completed, signed off  If discussed at [employer], dates discussed:    Additional Comments:  Kathyrn Drown Tynesia Harral, RN 06/06/2018, 11:04 AM

## 2018-06-06 NOTE — Progress Notes (Signed)
Patient discharged home with daughter, patient verbalized understanding of education. Patient with no complaints.

## 2018-06-06 NOTE — Progress Notes (Signed)
[facility]  ROUNDING NOTE   Subjective:   Daughter at bedside.   Hemodialysis treatment yesterday. Tolerated treatment well. UF of 2.5 liters.   Objective:  Vital signs in last 24 hours:  Temp:  [97.6 F (36.4 C)-98.7 F (37.1 C)] 98.3 F (36.8 C) (12/14 0724) Pulse Rate:  [69-88] 85 (12/14 0724) Resp:  [17-26] 23 (12/13 1927) BP: (109-146)/(54-104) 135/69 (12/14 0724) SpO2:  [90 %-100 %] 90 % (12/14 0724) Weight:  [72.7 kg-75.7 kg] 72.7 kg (12/14 0347)  Weight change: -1.1 kg Filed Weights   06/05/18 0326 06/05/18 1922 06/06/18 0347  Weight: 74.5 kg 75.7 kg 72.7 kg    Intake/Output: I/O last 3 completed shifts: In: 120 [P.O.:120] Out: 2121 [Urine:110; Other:2010; Stool:1]   Intake/Output this shift:  No intake/output data recorded.  Physical Exam: General: NAD,   Head: Normocephalic, atraumatic. Moist oral mucosal membranes  Eyes: Anicteric, PERRL  Neck: Supple, trachea midline  Lungs:  Clear to auscultation  Heart: Regular rate and rhythm  Abdomen:  Soft, nontender,   Extremities:  1+ peripheral edema. Right knee incision - clean and dry   Neurologic: Nonfocal, moving all four extremities  Skin: No lesions  Access: Left AVF    Basic Metabolic Panel: Recent Labs  Lab 06/03/18 2113 06/04/18 0326 06/04/18 1537 06/05/18 1616  NA 135 136 134* 135  K 3.3* 3.8 3.3* 3.8  CL 92* 94* 91* 95*  CO2 31 31 30 29   GLUCOSE 101* 82 80 112*  BUN 19 21 25* 21  CREATININE 3.49* 3.93* 4.39* 4.27*  CALCIUM 8.0* 7.9* 8.0* 8.1*  PHOS  --   --  3.8 3.3    Liver Function Tests: Recent Labs  Lab 06/03/18 2113 06/04/18 1537 06/05/18 1616  AST 45*  --   --   ALT 24  --   --   ALKPHOS 90  --   --   BILITOT 0.9  --   --   PROT 6.4*  --   --   ALBUMIN 3.0* 2.6* 2.8*   No results for input(s): LIPASE, AMYLASE in the last 168 hours. No results for input(s): AMMONIA in the last 168 hours.  CBC: Recent Labs  Lab 06/03/18 2113 06/04/18 0326  06/04/18 1537 06/05/18 1616  WBC 7.6 6.2 6.7 6.6  NEUTROABS  --   --  5.3  --   HGB 7.8* 7.6* 8.0* 7.9*  HCT 26.1* 25.3* 26.9* 26.6*  MCV 90.3 90.4 91.8 90.2  PLT 183 180 189 186    Cardiac Enzymes: Recent Labs  Lab 06/03/18 2113 06/04/18 0326 06/04/18 0917 06/04/18 1537  TROPONINI 0.34* 0.31* 0.32* 0.28*    BNP: Invalid input(s): POCBNP  CBG: No results for input(s): GLUCAP in the last 168 hours.  Microbiology: Results for orders placed or performed during the hospital encounter of 06/03/18  MRSA PCR Screening     Status: None   Collection Time: 06/04/18 12:35 AM  Result Value Ref Range Status   MRSA by PCR NEGATIVE NEGATIVE Final    Comment:        The GeneXpert MRSA Assay (FDA approved for NASAL specimens only), is one component of a comprehensive MRSA colonization surveillance program. It is not intended to diagnose MRSA infection nor to guide or monitor treatment for MRSA infections. Performed at [facility] [address] [place], [place] [address]     Coagulation Studies: No results for input(s): LABPROT, INR in the last 72 hours.  Urinalysis: Recent Labs  06/05/18 0826  COLORURINE YELLOW*  LABSPEC 1.011  PHURINE 9.0*  GLUCOSEU 50*  HGBUR MODERATE*  BILIRUBINUR NEGATIVE  KETONESUR NEGATIVE  PROTEINUR 100*  NITRITE NEGATIVE  LEUKOCYTESUR MODERATE*      Imaging: US Venous Img Lower Bilateral  Result Date: 06/05/2018 CLINICAL DATA:  History of DVT or pulmonary embolism EXAM: BILATERAL LOWER EXTREMITY VENOUS DUPLEX ULTRASOUND TECHNIQUE: Doppler venous assessment of the bilateral lower extremity deep venous system was performed, including characterization of spectral flow, compressibility, and plasticity. COMPARISON:  None. FINDINGS: There is complete compressibility of the bilateral common femoral, femoral, and popliteal veins. Doppler analysis demonstrates respiratory phasicity and augmentation of flow with calf compression. No  obvious superficial vein or calf vein thrombosis. IMPRESSION: No evidence of lower extremity DVT. Electronically Signed   By: Marybelle Killings M.D.   On: 06/05/2018 08:14     Medications:    . cinacalcet  60 mg Oral Q breakfast  . clopidogrel  75 mg Oral Daily  . heparin  5,000 Units Subcutaneous Q8H  . rosuvastatin  10 mg Oral Daily  . sevelamer carbonate  1,600 mg Oral With snacks  . sevelamer carbonate  2,400 mg Oral TID WC   acetaminophen **OR** acetaminophen, diphenhydrAMINE, nitroGLYCERIN, ondansetron **OR** ondansetron (ZOFRAN) IV, senna-docusate, traMADol  Assessment/ Plan:  Mr. Rondall Radigan is a 77 y.o. white male 77 y.o. white male with ESRD, hypothyroidism, hypertension, BPH, CAD status post CABG, hyperlipidemia, right total knee revision arthroplasty May 22, 2018 Dr. Marry Guan  [facility] nephrology/MWF  1.  End-stage renal disease: extra hemodialysis treatment Thursday. Hemodialysis treatment yesterday. Tolerated treatments well. Continue MWF schedule.   2.  Anemia of chronic kidney disease Continue mircera as outpatient.   3.  Secondary hyperparathyroidism Continue Renvela with meals.   4. Hypertension: blood pressure at goal.    LOS: 2 Alic Hilburn 12/14/201910:13 AM

## 2018-06-06 NOTE — Progress Notes (Signed)
[facility] [place] NAME: Jimmy Carson    MR#:  [id number]  DATE OF BIRTH:  Mar 27, 1941  DATE OF ADMISSION:  06/03/2018   ADMITTING PHYSICIAN: Lance Coon, MD  DATE OF DISCHARGE:  06/05/2018 PRIMARY CARE PHYSICIAN: Dion Body, MD   ADMISSION DIAGNOSIS:  Chest pain, unspecified type [R07.9] DISCHARGE DIAGNOSIS:  Principal Problem:   Chest pain Active Problems:   HTN (hypertension)   CAD (coronary artery disease)   HLD (hyperlipidemia)   ESRD on hemodialysis ([place])  SECONDARY DIAGNOSIS:   Past Medical History:  Diagnosis Date  . Anemia   . CAD (coronary artery disease)    s/p percutaneous coronary angioplasty, s/p CABG x 6   . ESRD (end stage renal disease) ([place])   . Heart attack ([place])   . History of radiation therapy    for prostate CA  . Hyperlipidemia   . Hyperparathyroidism, secondary renal ([place])   . Hypertension   . Osteoarthritis   . Polycystic kidney disease   . Prostate CA [facility]) 2012   Brachytherapy  . Renal insufficiency    currently on dialysis  . Shingles    HOSPITAL COURSE:   Chest pain with elevated troponin.  History of CAD. Likely demand ischemia in the setting of end stage renal disease on dialysis.  Defer cardiac catheterization, continue Plavix and nitro PRN per Dr. Ubaldo Glassing. Echo: EF: 25% - 30%.  Acute metabolic encephalopathy.  possible due to tramadol which is discontinued. CT head is unremarkable. The patient is AAOx3, but according to POA, this is not his baseline.  The patient has not sleep well for 3 days.   He does not have any focal neurologic deficit. Mental status improved.  HTN (hypertension) -home dose antihypertensives CAD (coronary artery disease) -continue home meds.  ESRD on hemodialysis. HD as scheduled per Dr. Juleen China.  Skin itch.  Possible due to ESRD.  Benadryl as needed. Improved.  HLD (hyperlipidemia) -continue Crestor.  Anemia of chronic  disease.  Stable. Hypokalemia.  Adjusted during hemodialysis. Improved.  Generalized weakness:  PT eval: HHPTOT. HIs daughtger (POA) will take him home. Discussed with Dr. Juleen China and Dr. Ubaldo Glassing. DISCHARGE CONDITIONS:  Stable, discharge to home with HHPT today. CONSULTS OBTAINED:  Treatment Team:  Teodoro Spray, MD Lavonia Dana, MD DRUG ALLERGIES:   Allergies  Allergen Reactions  . Orange Fruit [Citrus] Hives  . Penicillins Hives and Other (See Comments)    Has patient had a PCN reaction causing immediate rash, facial/tongue/throat swelling, SOB or lightheadedness with hypotension: Yes Has patient had a PCN reaction causing severe rash involving mucus membranes or skin necrosis: No Has patient had a PCN reaction that required hospitalization No Has patient had a PCN reaction occurring within the last 10 years: No If all of the above answers are "NO", then may proceed with Cephalosporin use.    DISCHARGE MEDICATIONS:   Allergies as of 06/06/2018      Reactions   Orange Fruit [citrus] Hives   Penicillins Hives, Other (See Comments)   Has patient had a PCN reaction causing immediate rash, facial/tongue/throat swelling, SOB or lightheadedness with hypotension: Yes Has patient had a PCN reaction causing severe rash involving mucus membranes or skin necrosis: No Has patient had a PCN reaction that required hospitalization No Has patient had a PCN reaction occurring within the last 10 years: No If all of the above answers are "NO", then may proceed with Cephalosporin use.      Medication  List    STOP taking these medications   oxyCODONE 5 MG immediate release tablet Commonly known as:  Oxy IR/ROXICODONE   traMADol 50 MG tablet Commonly known as:  ULTRAM     TAKE these medications   acetaminophen 500 MG tablet Commonly known as:  TYLENOL Take 1,000 mg by mouth every 6 (six) hours as needed for mild pain or headache.   cinacalcet 60 MG tablet Commonly known as:   SENSIPAR Take 60 mg by mouth daily.   clopidogrel 75 MG tablet Commonly known as:  PLAVIX Take 75 mg by mouth daily.   isosorbide mononitrate 60 MG 24 hr tablet Commonly known as:  IMDUR Take 60 mg by mouth daily.   nitroGLYCERIN 0.4 MG SL tablet Commonly known as:  NITROSTAT Place 1 tablet (0.4 mg total) under the tongue every 5 (five) minutes as needed for chest pain.   pyridOXINE 100 MG tablet Commonly known as:  VITAMIN B-6 Take 100 mg by mouth daily.   rosuvastatin 10 MG tablet Commonly known as:  CRESTOR Take 10 mg by mouth daily.   sevelamer carbonate 800 MG tablet Commonly known as:  RENVELA Take 1,600-2,400 mg by mouth See admin instructions. Take 2400 mg by mouth 3 times daily with meals and take 1600 mg by mouth with snacks   tamsulosin 0.4 MG Caps capsule Commonly known as:  FLOMAX Take 0.4 mg by mouth daily.   traZODone 50 MG tablet Commonly known as:  DESYREL Take 50 mg by mouth at bedtime.        DISCHARGE INSTRUCTIONS:  See AVS.  If you experience worsening of your admission symptoms, develop shortness of breath, life threatening emergency, suicidal or homicidal thoughts you must seek medical attention immediately by calling 911 or calling your MD immediately  if symptoms less severe.  You Must read complete instructions/literature along with all the possible adverse reactions/side effects for all the Medicines you take and that have been prescribed to you. Take any new Medicines after you have completely understood and accpet all the possible adverse reactions/side effects.   Please note  You were cared for by a hospitalist during your hospital stay. If you have any questions about your discharge medications or the care you received while you were in the hospital after you are discharged, you can call the unit and asked to speak with the hospitalist on call if the hospitalist that took care of you is not available. Once you are discharged, your primary  care physician will handle any further medical issues. Please note that NO REFILLS for any discharge medications will be authorized once you are discharged, as it is imperative that you return to your primary care physician (or establish a relationship with a primary care physician if you do not have one) for your aftercare needs so that they can reassess your need for medications and monitor your lab values.    On the day of Discharge:  VITAL SIGNS:  Blood pressure 135/69, pulse 85, temperature 98.3 F (36.8 C), resp. rate (!) 23, height 5\' 10"  (1.778 m), weight 72.7 kg, SpO2 90 %. PHYSICAL EXAMINATION:  GENERAL:  77 y.o.-year-old patient lying in the bed with no acute distress.  EYES: Pupils equal, round, reactive to light and accommodation. No scleral icterus. Extraocular muscles intact.  HEENT: Head atraumatic, normocephalic. Oropharynx and nasopharynx clear.  NECK:  Supple, no jugular venous distention. No thyroid enlargement, no tenderness.  LUNGS: Normal breath sounds bilaterally, no wheezing, rales,rhonchi or crepitation. No  use of accessory muscles of respiration.  CARDIOVASCULAR: S1, S2 normal. No murmurs, rubs, or gallops.  ABDOMEN: Soft, non-tender, non-distended. Bowel sounds present. No organomegaly or mass.  EXTREMITIES: No pedal edema, cyanosis, or clubbing.  NEUROLOGIC: Cranial nerves II through XII are intact. Muscle strength 5/5 in all extremities. Sensation intact. Gait not checked.  PSYCHIATRIC: The patient is alert and oriented x 3.  SKIN: No obvious rash, lesion, or ulcer.  DATA REVIEW:   CBC Recent Labs  Lab 06/05/18 1616  WBC 6.6  HGB 7.9*  HCT 26.6*  PLT 186    Chemistries  Recent Labs  Lab 06/03/18 2113  06/05/18 1616  NA 135   < > 135  K 3.3*   < > 3.8  CL 92*   < > 95*  CO2 31   < > 29  GLUCOSE 101*   < > 112*  BUN 19   < > 21  CREATININE 3.49*   < > 4.27*  CALCIUM 8.0*   < > 8.1*  AST 45*  --   --   ALT 24  --   --   ALKPHOS 90  --   --     BILITOT 0.9  --   --    < > = values in this interval not displayed.     Microbiology Results  Results for orders placed or performed during the hospital encounter of 06/03/18  MRSA PCR Screening     Status: None   Collection Time: 06/04/18 12:35 AM  Result Value Ref Range Status   MRSA by PCR NEGATIVE NEGATIVE Final    Comment:        The GeneXpert MRSA Assay (FDA approved for NASAL specimens only), is one component of a comprehensive MRSA colonization surveillance program. It is not intended to diagnose MRSA infection nor to guide or monitor treatment for MRSA infections. Performed at [facility], [address] [place],  [address]     RADIOLOGY:  No results found.   Management plans discussed with the patient, his daughter and they are in agreement.  CODE STATUS: Full Code   TOTAL TIME TAKING CARE OF THIS PATIENT: 38 minutes.    Demetrios Loll M.D on 06/06/2018 at 12:48 PM  Between 7am to 6pm - Pager - [phone]  After 6pm go to [email or web address] - Proofreader  [employer] [place] [facility]  Office  [phone]  CC: Primary care physician; Dion Body, MD   Note: This dictation was prepared with Dragon dictation along with smaller phrase technology. Any transcriptional errors that result from this process are unintentional.

## 2018-06-09 ENCOUNTER — Other Ambulatory Visit: Payer: Self-pay | Admitting: Specialist

## 2018-06-09 DIAGNOSIS — I5022 Chronic systolic (congestive) heart failure: Secondary | ICD-10-CM | POA: Insufficient documentation

## 2018-06-09 DIAGNOSIS — J90 Pleural effusion, not elsewhere classified: Secondary | ICD-10-CM

## 2018-06-11 ENCOUNTER — Ambulatory Visit
Admission: RE | Admit: 2018-06-11 | Discharge: 2018-06-11 | Disposition: A | Discharge status: Home / Self Care | Payer: Medicare Other | Source: Ambulatory Visit | Attending: Interventional Radiology | Admitting: Interventional Radiology

## 2018-06-11 ENCOUNTER — Ambulatory Visit: Payer: Medicare Other

## 2018-06-11 ENCOUNTER — Ambulatory Visit
Admission: RE | Admit: 2018-06-11 | Discharge: 2018-06-11 | Disposition: A | Discharge status: Home / Self Care | Payer: Medicare Other | Source: Ambulatory Visit | Attending: Specialist | Admitting: Specialist

## 2018-06-11 DIAGNOSIS — I517 Cardiomegaly: Secondary | ICD-10-CM | POA: Insufficient documentation

## 2018-06-11 DIAGNOSIS — J90 Pleural effusion, not elsewhere classified: Secondary | ICD-10-CM | POA: Diagnosis present

## 2018-06-11 DIAGNOSIS — Z9889 Other specified postprocedural states: Secondary | ICD-10-CM | POA: Diagnosis present

## 2018-06-11 DIAGNOSIS — R0989 Other specified symptoms and signs involving the circulatory and respiratory systems: Secondary | ICD-10-CM | POA: Insufficient documentation

## 2018-06-11 LAB — LACTATE DEHYDROGENASE, PLEURAL OR PERITONEAL FLUID: LD, Fluid: 156 U/L — ABNORMAL HIGH (ref 3–23)

## 2018-06-11 LAB — BODY FLUID CELL COUNT WITH DIFFERENTIAL
Eos, Fluid: 2 %
Lymphs, Fluid: 59 %
Monocyte-Macrophage-Serous Fluid: 23 %
Neutrophil Count, Fluid: 16 %
Other Cells, Fluid: 0 %
Total Nucleated Cell Count, Fluid: 186 cu mm

## 2018-06-11 LAB — GLUCOSE, PLEURAL OR PERITONEAL FLUID: Glucose, Fluid: 77 mg/dL

## 2018-06-11 LAB — PROTEIN, PLEURAL OR PERITONEAL FLUID: Total protein, fluid: <3 g/dL

## 2018-06-12 LAB — CYTOLOGY - NON PAP

## 2018-06-12 LAB — PROTEIN, BODY FLUID (OTHER): Total Protein, Body Fluid Other: 2.1 g/dL

## 2018-06-14 ENCOUNTER — Encounter: Payer: Self-pay | Admitting: Emergency Medicine

## 2018-06-14 ENCOUNTER — Emergency Department
Admission: EM | Admit: 2018-06-14 | Discharge: 2018-06-14 | Disposition: A | Discharge status: Home / Self Care | Payer: Medicare Other | Attending: Emergency Medicine | Admitting: Emergency Medicine

## 2018-06-14 ENCOUNTER — Other Ambulatory Visit: Payer: Self-pay

## 2018-06-14 DIAGNOSIS — Z951 Presence of aortocoronary bypass graft: Secondary | ICD-10-CM | POA: Diagnosis not present

## 2018-06-14 DIAGNOSIS — Z87891 Personal history of nicotine dependence: Secondary | ICD-10-CM | POA: Insufficient documentation

## 2018-06-14 DIAGNOSIS — Z952 Presence of prosthetic heart valve: Secondary | ICD-10-CM | POA: Diagnosis not present

## 2018-06-14 DIAGNOSIS — I2510 Atherosclerotic heart disease of native coronary artery without angina pectoris: Secondary | ICD-10-CM | POA: Insufficient documentation

## 2018-06-14 DIAGNOSIS — Z992 Dependence on renal dialysis: Secondary | ICD-10-CM | POA: Diagnosis not present

## 2018-06-14 DIAGNOSIS — I120 Hypertensive chronic kidney disease with stage 5 chronic kidney disease or end stage renal disease: Secondary | ICD-10-CM | POA: Diagnosis not present

## 2018-06-14 DIAGNOSIS — Y829 Unspecified medical devices associated with adverse incidents: Secondary | ICD-10-CM | POA: Diagnosis not present

## 2018-06-14 DIAGNOSIS — T8189XA Other complications of procedures, not elsewhere classified, initial encounter: Secondary | ICD-10-CM | POA: Insufficient documentation

## 2018-06-14 DIAGNOSIS — N186 End stage renal disease: Secondary | ICD-10-CM | POA: Diagnosis not present

## 2018-06-14 DIAGNOSIS — Z96653 Presence of artificial knee joint, bilateral: Secondary | ICD-10-CM | POA: Insufficient documentation

## 2018-06-14 DIAGNOSIS — T148XXA Other injury of unspecified body region, initial encounter: Secondary | ICD-10-CM

## 2018-06-14 DIAGNOSIS — Z8546 Personal history of malignant neoplasm of prostate: Secondary | ICD-10-CM | POA: Insufficient documentation

## 2018-06-14 DIAGNOSIS — L24A9 Irritant contact dermatitis due friction or contact with other specified body fluids: Secondary | ICD-10-CM

## 2018-06-14 LAB — BASIC METABOLIC PANEL
Anion gap: 11 (ref 5–15)
BUN: 15 mg/dL (ref 8–23)
CO2: 31 mmol/L (ref 22–32)
Calcium: 8.1 mg/dL — ABNORMAL LOW (ref 8.9–10.3)
Chloride: 94 mmol/L — ABNORMAL LOW (ref 98–111)
Creatinine, Ser: 2.09 mg/dL — ABNORMAL HIGH (ref 0.61–1.24)
GFR calc Af Amer: 35 mL/min — ABNORMAL LOW (ref >60)
GFR calc non Af Amer: 30 mL/min — ABNORMAL LOW (ref >60)
Glucose, Bld: 110 mg/dL — ABNORMAL HIGH (ref 70–99)
POTASSIUM: 3.2 mmol/L — AB (ref 3.5–5.1)
Sodium: 136 mmol/L (ref 135–145)

## 2018-06-14 LAB — CBC
HCT: 27.3 % — ABNORMAL LOW (ref 39.0–52.0)
HEMOGLOBIN: 8.1 g/dL — AB (ref 13.0–17.0)
MCH: 27.3 pg (ref 26.0–34.0)
MCHC: 29.7 g/dL — ABNORMAL LOW (ref 30.0–36.0)
MCV: 91.9 fL (ref 80.0–100.0)
Platelets: 203 10*3/uL (ref 150–400)
RBC: 2.97 MIL/uL — ABNORMAL LOW (ref 4.22–5.81)
RDW: 20.2 % — ABNORMAL HIGH (ref 11.5–15.5)
WBC: 5.7 10*3/uL (ref 4.0–10.5)
nRBC: 0 % (ref 0.0–0.2)

## 2018-06-14 LAB — BODY FLUID CULTURE: Culture: NO GROWTH

## 2018-06-14 MED ORDER — CEPHALEXIN 250 MG PO CAPS: 250.0000 mg | ORAL_CAPSULE | Freq: Two times a day (BID) | ORAL | 0 refills | Status: AC

## 2018-06-14 MED ORDER — CEPHALEXIN 500 MG PO CAPS
500.0000 mg | ORAL_CAPSULE | Freq: Once | ORAL | Status: AC
  Administered 2018-06-14: 500 mg via ORAL
  Filled 2018-06-14: qty 1

## 2018-06-14 NOTE — ED Notes (Signed)
ABD pad placed on rt knee and rewrapped with gauze.

## 2018-06-14 NOTE — ED Notes (Signed)
Culture sent to lab

## 2018-06-14 NOTE — ED Notes (Addendum)
Knee revision surgery 05/22/18. Currently on dialysis.  Went to dialysis today and showed staff knee and drainage. They stated think it is infected. Message sent to ortho have not received response. Rt knee is cool to touch and non red. Patient states was red yesterday but not today. Patient denies any pain.

## 2018-06-14 NOTE — ED Provider Notes (Signed)
[facility] Emergency Department Provider Note       Time seen: ----------------------------------------- 6:03 PM on 06/14/2018 -----------------------------------------   I have reviewed the triage vital signs and the nursing notes.  HISTORY   Chief Complaint No chief complaint on file.    HPI Jimmy Carson is a 77 y.o. 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  Patient recently had knee surgery and reportedly has had some redness and drainage.  Family is changing the dressing with absorbent pads.  He denies fevers, chills, pain or other complaints.  He was fully dialyzed today.  Past Medical History:  Diagnosis Date  . Anemia   . CAD (coronary artery disease)    s/p percutaneous coronary angioplasty, s/p CABG x 6   . ESRD (end stage renal disease) ([place])   . Heart attack ([place])   . History of radiation therapy    for prostate CA  . Hyperlipidemia   . Hyperparathyroidism, secondary renal ([place])   . Hypertension   . Osteoarthritis   . Polycystic kidney disease   . Prostate CA [facility]) 2012   Brachytherapy  . Renal insufficiency    currently on dialysis  . Shingles     Patient Active Problem List   Diagnosis Date Noted  . History of revision of total knee arthroplasty 110-31-2019  . CAD S/P percutaneous coronary angioplasty 05/15/2018  . ESRD on hemodialysis ([place]) 05/15/2018  . Insomnia 05/15/2018  . Pure hypercholesterolemia 05/15/2018  . Pleural effusion on left 08/30/2017  . HTN (hypertension) 08/30/2017  . CAD (coronary artery disease) 08/30/2017  . HLD (hyperlipidemia) 08/30/2017  . 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 Pulmonary emphysema ([place]) 07/18/2016  . Other specified complication of vascular prosthetic devices, implants and grafts, initial encounter ([place]) 05/08/2016  .  Complete tear of left rotator cuff 01/12/2016  . Rotator cuff arthropathy, left 01/12/2016  . Rotator cuff arthropathy, right 01/12/2016  . NSTEMI (non-ST elevated myocardial infarction) ([place]) 07/22/2015  . Chest pain 07/21/2015  . Non-rheumatic mitral regurgitation 07/18/2015  . History of prostate cancer 12/02/2014  . Lymphopenia 12/02/2014  . 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 S/P CABG x 6 02/16/2014  . Discitis 02/11/2013  . Anemia of chronic disease 12/04/2012  . Hyperparathyroidism, secondary renal ([place]) 12/04/2012  . Congenital polycystic kidney 12/03/2012  . PKD (polycystic kidney disease) 12/03/2012    Past Surgical History:  Procedure Laterality Date  . AORTIC VALVE REPLACEMENT    . CARDIAC CATHETERIZATION N/A 07/24/2015   Procedure: Coronary/Graft Angiography;  Surgeon: Corey Skains, MD;  Location: [place] CV LAB;  Service: Cardiovascular;  Laterality: N/A;  . CARDIAC CATHETERIZATION N/A 06/03/2016   Procedure: Left Heart Cath and Coronary Angiography;  Surgeon: Isaias Cowman, MD;  Location: [place] CV LAB;  Service: Cardiovascular;  Laterality: N/A;  . CORONARY ARTERY BYPASS GRAFT    . HERNIA REPAIR    . JOINT REPLACEMENT    . REPLACEMENT TOTAL KNEE BILATERAL    . ROTATOR CUFF REPAIR Right   . TOTAL KNEE REVISION Right 110-31-2019   Procedure: TOTAL KNEE REVISION;  Surgeon: Dereck Leep, MD;  Location: [facility] ORS;  Service: Orthopedics;  Laterality: Right;    Allergies Orange fruit [citrus] and Penicillins  Social History Social History   Tobacco Use  . Smoking status: Former Smoker  Last attempt to quit: 04/19/2013    Years since quitting: 5.1  . Smokeless tobacco: Never Used  Substance Use Topics  . Alcohol use: No  . Drug use: No   Review of Systems Constitutional: Negative for fever. Cardiovascular: Negative for chest pain. Respiratory: Negative for shortness  of breath. Gastrointestinal: Negative for abdominal pain, vomiting and diarrhea. Musculoskeletal: Negative for knee pain Skin: Positive for drainage from the right knee Neurological: Negative for headaches, focal weakness or numbness.  All systems negative/normal/unremarkable except as stated in the HPI  ____________________________________________   PHYSICAL EXAM:  VITAL SIGNS: ED Triage Vitals  Enc Vitals Group     BP 06/14/18 1602 (!) 109/55     Pulse Rate 06/14/18 1602 77     Resp 06/14/18 1602 14     Temp 06/14/18 1602 98.3 F (36.8 C)     Temp Source 06/14/18 1602 Oral     SpO2 06/14/18 1602 100 %     Weight --      Height --      Head Circumference --      Peak Flow --      Pain Score 06/14/18 1606 0     Pain Loc --      Pain Edu? --      Excl. in [place]? --    Constitutional: Alert and oriented.  [place] ill-appearing, no distress Cardiovascular: Normal rate, regular rhythm. No murmurs, rubs, or gallops. Respiratory: Normal respiratory effort without tachypnea nor retractions. Breath sounds are clear and equal bilaterally. No wheezes/rales/rhonchi. Gastrointestinal: Soft and nontender. Normal bowel sounds Musculoskeletal: No significant pain with range of motion, right knee effusion is noted.  Surgical incision site appears clean dry and intact, minimal erythema.  There is serous fluid from the superior aspect of the right total knee replacement incision site Neurologic:  Normal speech and language. No gross focal neurologic deficits are appreciated.  Skin: No significant erythema, some clear or yellowish drainage from the superior aspect of the wound on the right knee Psychiatric: Mood and affect are normal. Speech and behavior are normal.  ____________________________________________  ED COURSE:  As part of my medical decision making, I reviewed the following data within the [place] History obtained from family if available, nursing notes, old  chart and ekg, as well as notes from prior ED visits. Patient presented for possible wound infection, we will assess with labs and imaging as indicated at this time.   Procedures ____________________________________________   LABS (pertinent positives/negatives)  Labs Reviewed  BASIC METABOLIC PANEL - Abnormal; Notable for the following components:      Result Value   Potassium 3.2 (*)    Chloride 94 (*)    Glucose, Bld 110 (*)    Creatinine, Ser 2.09 (*)    Calcium 8.1 (*)    GFR calc non Af Amer 30 (*)    GFR calc Af Amer 35 (*)    All other components within normal limits  CBC - Abnormal; Notable for the following components:   RBC 2.97 (*)    Hemoglobin 8.1 (*)    HCT 27.3 (*)    MCHC 29.7 (*)    RDW 20.2 (*)    All other components within normal limits  AEROBIC/ANAEROBIC CULTURE (SURGICAL/DEEP WOUND)  ____________________________________________  DIFFERENTIAL DIAGNOSIS   Seroma, septic arthritis unlikely, cellulitis, abscess  FINAL ASSESSMENT AND PLAN  Wound drainage   Plan: The patient had presented for drainage from her right knee surgery incision site. Patient's  labs were unremarkable.  I did send a wound culture.  Clinically he does not have a septic arthritis.  I have sent a message to his orthopedic surgeon.  We will place him on Keflex.  He is cleared for outpatient follow-up.   Laurence Aly, MD   Note: This note was generated in part or whole with voice recognition software. Voice recognition is usually quite accurate but there are transcription errors that can and very often do occur. I apologize for any typographical errors that were not detected and corrected.     Earleen Newport, MD 06/14/18 [phone]

## 2018-06-14 NOTE — ED Triage Notes (Addendum)
PT to ED from dialysis center where pt was told to come be evaluated for RT knee infection. PT had recent knee surgery xfew weeks ago and is now experiencing redness and drainage. Denies fever. Pt A&Ox4 VSS . incision  noted to have + drainage. Pt was fully dialyzed today

## 2018-06-14 NOTE — ED Notes (Signed)
Patient alert and oriented x4. Patient states understanding of discharge info and ABT therapy. Patient signed discharge info.

## 2018-06-14 NOTE — ED Notes (Signed)
Dr. Marry Guan performed operation

## 2018-06-16 LAB — ACID FAST SMEAR (AFB, MYCOBACTERIA): Acid Fast Smear: NEGATIVE

## 2018-06-18 ENCOUNTER — Ambulatory Visit: Payer: Medicare Other

## 2018-06-19 LAB — AEROBIC/ANAEROBIC CULTURE W GRAM STAIN (SURGICAL/DEEP WOUND): Culture: NORMAL

## 2018-06-19 LAB — AEROBIC/ANAEROBIC CULTURE (SURGICAL/DEEP WOUND)

## 2018-06-25 ENCOUNTER — Emergency Department: Payer: Medicare Other

## 2018-06-25 ENCOUNTER — Other Ambulatory Visit: Payer: Self-pay

## 2018-06-25 ENCOUNTER — Encounter: Payer: Self-pay | Admitting: Emergency Medicine

## 2018-06-25 ENCOUNTER — Inpatient Hospital Stay
Admission: EM | Admit: 2018-06-25 | Discharge: 2018-06-27 | DRG: 291 | Disposition: A | Discharge status: Home / Self Care | Payer: Medicare Other | Attending: Internal Medicine | Admitting: Internal Medicine

## 2018-06-25 DIAGNOSIS — N186 End stage renal disease: Secondary | ICD-10-CM | POA: Diagnosis present

## 2018-06-25 DIAGNOSIS — Z87891 Personal history of nicotine dependence: Secondary | ICD-10-CM | POA: Diagnosis not present

## 2018-06-25 DIAGNOSIS — E785 Hyperlipidemia, unspecified: Secondary | ICD-10-CM | POA: Diagnosis not present

## 2018-06-25 DIAGNOSIS — Z96651 Presence of right artificial knee joint: Secondary | ICD-10-CM | POA: Diagnosis not present

## 2018-06-25 DIAGNOSIS — Z8546 Personal history of malignant neoplasm of prostate: Secondary | ICD-10-CM | POA: Diagnosis not present

## 2018-06-25 DIAGNOSIS — Z9861 Coronary angioplasty status: Secondary | ICD-10-CM

## 2018-06-25 DIAGNOSIS — Z923 Personal history of irradiation: Secondary | ICD-10-CM

## 2018-06-25 DIAGNOSIS — Z7902 Long term (current) use of antithrombotics/antiplatelets: Secondary | ICD-10-CM | POA: Diagnosis not present

## 2018-06-25 DIAGNOSIS — E039 Hypothyroidism, unspecified: Secondary | ICD-10-CM | POA: Diagnosis not present

## 2018-06-25 DIAGNOSIS — Z951 Presence of aortocoronary bypass graft: Secondary | ICD-10-CM | POA: Diagnosis not present

## 2018-06-25 DIAGNOSIS — J449 Chronic obstructive pulmonary disease, unspecified: Secondary | ICD-10-CM | POA: Diagnosis present

## 2018-06-25 DIAGNOSIS — R0602 Shortness of breath: Secondary | ICD-10-CM

## 2018-06-25 DIAGNOSIS — I5023 Acute on chronic systolic (congestive) heart failure: Secondary | ICD-10-CM | POA: Diagnosis present

## 2018-06-25 DIAGNOSIS — N2581 Secondary hyperparathyroidism of renal origin: Secondary | ICD-10-CM | POA: Diagnosis not present

## 2018-06-25 DIAGNOSIS — J918 Pleural effusion in other conditions classified elsewhere: Secondary | ICD-10-CM | POA: Diagnosis present

## 2018-06-25 DIAGNOSIS — I132 Hypertensive heart and chronic kidney disease with heart failure and with stage 5 chronic kidney disease, or end stage renal disease: Secondary | ICD-10-CM | POA: Diagnosis present

## 2018-06-25 DIAGNOSIS — N400 Enlarged prostate without lower urinary tract symptoms: Secondary | ICD-10-CM | POA: Diagnosis not present

## 2018-06-25 DIAGNOSIS — Z9889 Other specified postprocedural states: Secondary | ICD-10-CM

## 2018-06-25 DIAGNOSIS — Z880 Allergy status to penicillin: Secondary | ICD-10-CM | POA: Diagnosis not present

## 2018-06-25 DIAGNOSIS — I252 Old myocardial infarction: Secondary | ICD-10-CM

## 2018-06-25 DIAGNOSIS — Z79899 Other long term (current) drug therapy: Secondary | ICD-10-CM

## 2018-06-25 DIAGNOSIS — I509 Heart failure, unspecified: Secondary | ICD-10-CM

## 2018-06-25 DIAGNOSIS — I959 Hypotension, unspecified: Secondary | ICD-10-CM | POA: Diagnosis present

## 2018-06-25 DIAGNOSIS — Z992 Dependence on renal dialysis: Secondary | ICD-10-CM

## 2018-06-25 DIAGNOSIS — D631 Anemia in chronic kidney disease: Secondary | ICD-10-CM | POA: Diagnosis not present

## 2018-06-25 DIAGNOSIS — I2510 Atherosclerotic heart disease of native coronary artery without angina pectoris: Secondary | ICD-10-CM | POA: Diagnosis present

## 2018-06-25 DIAGNOSIS — J90 Pleural effusion, not elsewhere classified: Secondary | ICD-10-CM

## 2018-06-25 DIAGNOSIS — R0600 Dyspnea, unspecified: Secondary | ICD-10-CM | POA: Diagnosis present

## 2018-06-25 LAB — BASIC METABOLIC PANEL
Anion gap: 14 (ref 5–15)
BUN: 40 mg/dL — ABNORMAL HIGH (ref 8–23)
CHLORIDE: 94 mmol/L — AB (ref 98–111)
CO2: 28 mmol/L (ref 22–32)
Calcium: 8.4 mg/dL — ABNORMAL LOW (ref 8.9–10.3)
Creatinine, Ser: 5.27 mg/dL — ABNORMAL HIGH (ref 0.61–1.24)
GFR calc Af Amer: 11 mL/min — ABNORMAL LOW (ref >60)
GFR calc non Af Amer: 10 mL/min — ABNORMAL LOW (ref >60)
Glucose, Bld: 102 mg/dL — ABNORMAL HIGH (ref 70–99)
Potassium: 4.9 mmol/L (ref 3.5–5.1)
SODIUM: 136 mmol/L (ref 135–145)

## 2018-06-25 LAB — CBC
HCT: 26.5 % — ABNORMAL LOW (ref 39.0–52.0)
Hemoglobin: 7.9 g/dL — ABNORMAL LOW (ref 13.0–17.0)
MCH: 27.4 pg (ref 26.0–34.0)
MCHC: 29.8 g/dL — ABNORMAL LOW (ref 30.0–36.0)
MCV: 92 fL (ref 80.0–100.0)
Platelets: 169 10*3/uL (ref 150–400)
RBC: 2.88 MIL/uL — ABNORMAL LOW (ref 4.22–5.81)
RDW: 20.3 % — ABNORMAL HIGH (ref 11.5–15.5)
WBC: 6.3 10*3/uL (ref 4.0–10.5)
nRBC: 0 % (ref 0.0–0.2)

## 2018-06-25 LAB — TROPONIN I: Troponin I: 0.07 ng/mL (ref <0.03)

## 2018-06-25 MED ORDER — ACETAMINOPHEN 650 MG RE SUPP: 650.0000 mg | Freq: Four times a day (QID) | RECTAL | Status: DC | PRN

## 2018-06-25 MED ORDER — NITROGLYCERIN 0.4 MG SL SUBL: 0.4000 mg | SUBLINGUAL_TABLET | SUBLINGUAL | Status: DC | PRN

## 2018-06-25 MED ORDER — VITAMIN B-6 50 MG PO TABS
100.0000 mg | ORAL_TABLET | Freq: Every day | ORAL | Status: DC
  Administered 2018-06-26 – 2018-06-27 (×2): 100 mg via ORAL
  Filled 2018-06-25 (×2): qty 2

## 2018-06-25 MED ORDER — ISOSORBIDE MONONITRATE ER 30 MG PO TB24
60.0000 mg | ORAL_TABLET | Freq: Every day | ORAL | Status: DC
  Administered 2018-06-26: 60 mg via ORAL
  Filled 2018-06-25: qty 2

## 2018-06-25 MED ORDER — ONDANSETRON HCL 4 MG/2ML IJ SOLN: 4.0000 mg | Freq: Four times a day (QID) | INTRAMUSCULAR | Status: DC | PRN

## 2018-06-25 MED ORDER — ACETAMINOPHEN 325 MG PO TABS: 650.0000 mg | ORAL_TABLET | Freq: Four times a day (QID) | ORAL | Status: DC | PRN

## 2018-06-25 MED ORDER — SODIUM CHLORIDE 0.9% FLUSH
3.0000 mL | Freq: Two times a day (BID) | INTRAVENOUS | Status: DC
  Administered 2018-06-25 – 2018-06-27 (×4): 3 mL via INTRAVENOUS

## 2018-06-25 MED ORDER — ACETAMINOPHEN 500 MG PO TABS: 1000.0000 mg | ORAL_TABLET | Freq: Four times a day (QID) | ORAL | Status: DC | PRN

## 2018-06-25 MED ORDER — CEPHALEXIN 500 MG PO CAPS
500.0000 mg | ORAL_CAPSULE | Freq: Two times a day (BID) | ORAL | Status: DC
  Administered 2018-06-25 – 2018-06-27 (×4): 500 mg via ORAL
  Filled 2018-06-25 (×4): qty 1

## 2018-06-25 MED ORDER — HEPARIN SODIUM (PORCINE) 5000 UNIT/ML IJ SOLN
5000.0000 [IU] | Freq: Three times a day (TID) | INTRAMUSCULAR | Status: DC
  Administered 2018-06-25: 5000 [IU] via SUBCUTANEOUS
  Filled 2018-06-25: qty 1

## 2018-06-25 MED ORDER — CINACALCET HCL 30 MG PO TABS
60.0000 mg | ORAL_TABLET | Freq: Every day | ORAL | Status: DC
  Administered 2018-06-26 – 2018-06-27 (×2): 60 mg via ORAL
  Filled 2018-06-25 (×2): qty 2

## 2018-06-25 MED ORDER — SODIUM CHLORIDE 0.9 % IV SOLN: 250.0000 mL | INTRAVENOUS | Status: DC | PRN

## 2018-06-25 MED ORDER — ROSUVASTATIN CALCIUM 10 MG PO TABS
10.0000 mg | ORAL_TABLET | Freq: Every day | ORAL | Status: DC
  Administered 2018-06-26: 10 mg via ORAL
  Filled 2018-06-25: qty 1

## 2018-06-25 MED ORDER — SEVELAMER CARBONATE 800 MG PO TABS
2400.0000 mg | ORAL_TABLET | Freq: Three times a day (TID) | ORAL | Status: DC
  Administered 2018-06-26: 2400 mg via ORAL
  Filled 2018-06-25 (×2): qty 3

## 2018-06-25 MED ORDER — SODIUM CHLORIDE 0.9% FLUSH: 3.0000 mL | INTRAVENOUS | Status: DC | PRN

## 2018-06-25 MED ORDER — ONDANSETRON HCL 4 MG PO TABS: 4.0000 mg | ORAL_TABLET | Freq: Four times a day (QID) | ORAL | Status: DC | PRN

## 2018-06-25 MED ORDER — ALBUTEROL SULFATE (2.5 MG/3ML) 0.083% IN NEBU
2.5000 mg | INHALATION_SOLUTION | Freq: Four times a day (QID) | RESPIRATORY_TRACT | Status: DC
  Administered 2018-06-26: 2.5 mg via RESPIRATORY_TRACT
  Filled 2018-06-25: qty 3

## 2018-06-25 NOTE — ED Provider Notes (Signed)
[facility] Emergency Department Provider Note ____________________________________________   First MD Initiated Contact with Patient 06/25/18 2013     (approximate)  I have reviewed the triage vital signs and the nursing notes.   HISTORY  Chief Complaint Shortness of Breath    HPI Jimmy Carson is a 78 y.o. male with PMH as noted below including ESRD on dialysis, anemia, and pleural effusion who presents with shortness of breath, gradual onset today, and worse with any exertion.  The patient states it feels similarly to when his pleural effusion has worsened and he has needed to have it drained.  He states it was most recently drained as an outpatient a few weeks ago.  He denies any associated chest pain or fever.  Past Medical History:  Diagnosis Date  . Anemia   . CAD (coronary artery disease)    s/p percutaneous coronary angioplasty, s/p CABG x 6   . ESRD (end stage renal disease) ([place])   . Heart attack ([place])   . History of radiation therapy    for prostate CA  . Hyperlipidemia   . Hyperparathyroidism, secondary renal ([place])   . Hypertension   . Osteoarthritis   . Polycystic kidney disease   . Prostate CA [facility]) 2012   Brachytherapy  . Renal insufficiency    currently on dialysis  . Shingles     Patient Active Problem List   Diagnosis Date Noted  . Dyspnea 06/25/2018  . History of revision of total knee arthroplasty 1Apr 18, 202019  . CAD S/P percutaneous coronary angioplasty 05/15/2018  . ESRD on hemodialysis ([place]) 05/15/2018  . Insomnia 05/15/2018  . Pure hypercholesterolemia 05/15/2018  . Pleural effusion on left 08/30/2017  . HTN (hypertension) 08/30/2017  . CAD (coronary artery disease) 08/30/2017  . HLD (hyperlipidemia) 08/30/2017  . 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 Pulmonary emphysema ([place]) 07/18/2016  . Other specified complication of vascular prosthetic devices, implants and grafts, initial encounter ([place])  05/08/2016  . Complete tear of left rotator cuff 01/12/2016  . Rotator cuff arthropathy, left 01/12/2016  . Rotator cuff arthropathy, right 01/12/2016  . NSTEMI (non-ST elevated myocardial infarction) ([place]) 07/22/2015  . Chest pain 07/21/2015  . Non-rheumatic mitral regurgitation 07/18/2015  . History of prostate cancer 12/02/2014  . Lymphopenia 12/02/2014  . 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 S/P CABG x 6 02/16/2014  . Discitis 02/11/2013  . Anemia of chronic disease 12/04/2012  . Hyperparathyroidism, secondary renal ([place]) 12/04/2012  . Congenital polycystic kidney 12/03/2012  . PKD (polycystic kidney disease) 12/03/2012    Past Surgical History:  Procedure Laterality Date  . AORTIC VALVE REPLACEMENT    . CARDIAC CATHETERIZATION N/A 07/24/2015   Procedure: Coronary/Graft Angiography;  Surgeon: Corey Skains, MD;  Location: [place] CV LAB;  Service: Cardiovascular;  Laterality: N/A;  . CARDIAC CATHETERIZATION N/A 06/03/2016   Procedure: Left Heart Cath and Coronary Angiography;  Surgeon: Isaias Cowman, MD;  Location: [place] CV LAB;  Service: Cardiovascular;  Laterality: N/A;  . CORONARY ARTERY BYPASS GRAFT    . HERNIA REPAIR    . JOINT REPLACEMENT    . REPLACEMENT TOTAL KNEE BILATERAL    . ROTATOR CUFF REPAIR Right   . TOTAL KNEE REVISION Right 1Apr 18, 202019   Procedure: TOTAL KNEE REVISION;  Surgeon: Dereck Leep, MD;  Location: [facility] ORS;  Service: Orthopedics;  Laterality: Right;    Prior to Admission medications   Medication Sig  Start Date End Date Taking? Authorizing Provider  acetaminophen (TYLENOL) 500 MG tablet Take 1,000 mg by mouth every 6 (six) hours as needed for mild pain or headache.    Yes [provider]  cephALEXin (KEFLEX) 500 MG capsule Take 500 mg by mouth 2 (two) times daily.   Yes [provider]  cinacalcet (SENSIPAR) 60 MG tablet Take 60 mg by mouth  daily.    Yes [provider]  clopidogrel (PLAVIX) 75 MG tablet Take 75 mg by mouth daily.   Yes [provider]  isosorbide mononitrate (IMDUR) 60 MG 24 hr tablet Take 60 mg by mouth daily.   Yes [provider]  nitroGLYCERIN (NITROSTAT) 0.4 MG SL tablet Place 1 tablet (0.4 mg total) under the tongue every 5 (five) minutes as needed for chest pain. 06/03/16  Yes Max Sane, MD  pyridOXINE (VITAMIN B-6) 100 MG tablet Take 100 mg by mouth daily.   Yes [provider]  rosuvastatin (CRESTOR) 10 MG tablet Take 10 mg by mouth daily.   Yes [provider]  sevelamer carbonate (RENVELA) 800 MG tablet Take 1,600-2,400 mg by mouth See admin instructions. Take 2400 mg by mouth 3 times daily with meals and take 1600 mg by mouth with snacks   Yes [provider]    Allergies Orange fruit [citrus] and Penicillins  Family History  Problem Relation Age of Onset  . Polycystic kidney disease Father   . Aneurysm Father     Social History Social History   Tobacco Use  . Smoking status: Former Smoker    Last attempt to quit: 04/19/2013    Years since quitting: 5.1  . Smokeless tobacco: Never Used  Substance Use Topics  . Alcohol use: No  . Drug use: No    Review of Systems  Constitutional: No fever. Eyes: No redness. ENT: No sore throat. Cardiovascular: Denies chest pain. Respiratory: Positive for shortness of breath. Gastrointestinal: No vomiting or diarrhea.  Genitourinary: Negative for dysuria.  Musculoskeletal: Negative for back pain. Skin: Negative for rash. Neurological: Negative for headache.   ____________________________________________   PHYSICAL EXAM:  VITAL SIGNS: ED Triage Vitals  Enc Vitals Group     BP 06/25/18 1808 96/60     Pulse Rate 06/25/18 1808 70     Resp 06/25/18 1808 20     Temp 06/25/18 1808 97.7 F (36.5 C)     Temp Source 06/25/18 1808 Oral     SpO2 06/25/18 1808 100 %     Weight --       Height --      Head Circumference --      Peak Flow --      Pain Score 06/25/18 1804 0     Pain Loc --      Pain Edu? --      Excl. in [place]? --     Constitutional: Alert and oriented.  Weak and uncomfortable appearing but in no acute distress. Eyes: Conjunctivae are normal.  Head: Atraumatic. Nose: No congestion/rhinnorhea. Mouth/Throat: Mucous membranes are moist.   Neck: Normal range of motion.  Cardiovascular: Normal rate, regular rhythm. Grossly normal heart sounds.  Good peripheral circulation. Respiratory: Normal respiratory effort.  No retractions.  Decreased breath sounds bilaterally, worse on the left. Gastrointestinal:  No distention.  Musculoskeletal: Extremities warm and well perfused.  Neurologic:  Normal speech and language. No gross focal neurologic deficits are appreciated.  Skin:  Skin is warm and dry. No rash noted. Psychiatric: Mood and  affect are normal. Speech and behavior are normal.  ____________________________________________   LABS (all labs ordered are listed, but only abnormal results are displayed)  Labs Reviewed  BASIC METABOLIC PANEL - Abnormal; Notable for the following components:      Result Value   Chloride 94 (*)    Glucose, Bld 102 (*)    BUN 40 (*)    Creatinine, Ser 5.27 (*)    Calcium 8.4 (*)    GFR calc non Af Amer 10 (*)    GFR calc Af Amer 11 (*)    All other components within normal limits  CBC - Abnormal; Notable for the following components:   RBC 2.88 (*)    Hemoglobin 7.9 (*)    HCT 26.5 (*)    MCHC 29.8 (*)    RDW 20.3 (*)    All other components within normal limits  TROPONIN I - Abnormal; Notable for the following components:   Troponin I 0.07 (*)    All other components within normal limits   ____________________________________________  EKG  ED ECG REPORT I, Arta Silence, the attending physician, personally viewed and interpreted this ECG.  Date: 06/25/2018 EKG Time: 1804 Rate: 72 Rhythm: normal sinus  rhythm QRS Axis: normal Intervals: RBBB, LPFB ST/T Wave abnormalities: Nonspecific ST abnormalities Narrative Interpretation: no evidence of acute ischemia  ____________________________________________  RADIOLOGY  CXR: Left-sided pleural effusion  ____________________________________________   PROCEDURES  Procedure(s) performed: No  Procedures  Critical Care performed: No ____________________________________________   INITIAL IMPRESSION / ASSESSMENT AND PLAN / ED COURSE  Pertinent labs & imaging results that were available during my care of the patient were reviewed by me and considered in my medical decision making (see chart for details).  78 year old male with PMH as noted above presents with acute onset of worsening shortness of breath today.  He states it feels like when his effusion has worsened.  Patient reports his most recent thoracentesis was a few weeks ago and over 700 cc of fluid was drained.  I reviewed the past medical records in Epic; the patient was admitted last month with chest pain and elevated troponin, thought to be demand ischemia.  He subsequently had thoracentesis as an outpatient.  On exam today, the patient is weak and uncomfortable appearing but in no acute distress.  He is tachypneic with borderline low blood pressure and otherwise normal vital signs.  Initial work-up reveals persistent left-sided pleural effusion.  The most recent x-ray from his last admission was prior to his most recent thoracentesis.  The lab work-up reveals stable abnormalities including anemia and renal insufficiency.  Although the patient has no hypoxia, given his tachypnea and increased work of breathing as well as the fact that he has significant shortness of breath even with minimal exertion, I do not think he is stable for discharge home.  He will need thoracentesis tomorrow morning and then should likely go to dialysis.  I will admit him to the  hospitalist.  ----------------------------------------- 11:22 PM on 06/25/2018 -----------------------------------------  I signed the patient out to the hospitalist Dr. Jerelyn Charles. ____________________________________________   FINAL CLINICAL IMPRESSION(S) / ED DIAGNOSES  Final diagnoses:  Pleural effusion      NEW MEDICATIONS STARTED DURING THIS VISIT:  New Prescriptions   No medications on file     Note:  This document was prepared using Dragon voice recognition software and may include unintentional dictation errors.    Arta Silence, MD 06/25/18 [id number]

## 2018-06-25 NOTE — ED Triage Notes (Signed)
Pt c/o increased SOB today, recent thoracentesis and states feels like he " is full of fluid again". Pt RR even and unlabored.  A&OX4, last dialysis was Tuesday. NAD ntoed

## 2018-06-26 ENCOUNTER — Observation Stay: Payer: Medicare Other

## 2018-06-26 ENCOUNTER — Other Ambulatory Visit: Payer: Self-pay

## 2018-06-26 DIAGNOSIS — N186 End stage renal disease: Secondary | ICD-10-CM | POA: Diagnosis not present

## 2018-06-26 DIAGNOSIS — N2581 Secondary hyperparathyroidism of renal origin: Secondary | ICD-10-CM | POA: Diagnosis not present

## 2018-06-26 DIAGNOSIS — Z992 Dependence on renal dialysis: Secondary | ICD-10-CM | POA: Diagnosis not present

## 2018-06-26 DIAGNOSIS — I959 Hypotension, unspecified: Secondary | ICD-10-CM | POA: Diagnosis not present

## 2018-06-26 DIAGNOSIS — Z923 Personal history of irradiation: Secondary | ICD-10-CM | POA: Diagnosis not present

## 2018-06-26 DIAGNOSIS — N400 Enlarged prostate without lower urinary tract symptoms: Secondary | ICD-10-CM | POA: Diagnosis not present

## 2018-06-26 DIAGNOSIS — D631 Anemia in chronic kidney disease: Secondary | ICD-10-CM | POA: Diagnosis not present

## 2018-06-26 DIAGNOSIS — I509 Heart failure, unspecified: Secondary | ICD-10-CM

## 2018-06-26 DIAGNOSIS — Z880 Allergy status to penicillin: Secondary | ICD-10-CM | POA: Diagnosis not present

## 2018-06-26 DIAGNOSIS — Z8546 Personal history of malignant neoplasm of prostate: Secondary | ICD-10-CM | POA: Diagnosis not present

## 2018-06-26 DIAGNOSIS — Z96651 Presence of right artificial knee joint: Secondary | ICD-10-CM | POA: Diagnosis not present

## 2018-06-26 DIAGNOSIS — Z951 Presence of aortocoronary bypass graft: Secondary | ICD-10-CM | POA: Diagnosis not present

## 2018-06-26 DIAGNOSIS — J918 Pleural effusion in other conditions classified elsewhere: Secondary | ICD-10-CM | POA: Diagnosis not present

## 2018-06-26 DIAGNOSIS — E039 Hypothyroidism, unspecified: Secondary | ICD-10-CM | POA: Diagnosis not present

## 2018-06-26 DIAGNOSIS — Z7902 Long term (current) use of antithrombotics/antiplatelets: Secondary | ICD-10-CM | POA: Diagnosis not present

## 2018-06-26 DIAGNOSIS — Z87891 Personal history of nicotine dependence: Secondary | ICD-10-CM | POA: Diagnosis not present

## 2018-06-26 DIAGNOSIS — E785 Hyperlipidemia, unspecified: Secondary | ICD-10-CM | POA: Diagnosis not present

## 2018-06-26 DIAGNOSIS — I5023 Acute on chronic systolic (congestive) heart failure: Secondary | ICD-10-CM | POA: Diagnosis not present

## 2018-06-26 DIAGNOSIS — I252 Old myocardial infarction: Secondary | ICD-10-CM | POA: Diagnosis not present

## 2018-06-26 DIAGNOSIS — J449 Chronic obstructive pulmonary disease, unspecified: Secondary | ICD-10-CM | POA: Diagnosis not present

## 2018-06-26 DIAGNOSIS — I132 Hypertensive heart and chronic kidney disease with heart failure and with stage 5 chronic kidney disease, or end stage renal disease: Secondary | ICD-10-CM | POA: Diagnosis present

## 2018-06-26 DIAGNOSIS — Z9861 Coronary angioplasty status: Secondary | ICD-10-CM | POA: Diagnosis not present

## 2018-06-26 DIAGNOSIS — Z79899 Other long term (current) drug therapy: Secondary | ICD-10-CM | POA: Diagnosis not present

## 2018-06-26 DIAGNOSIS — I2510 Atherosclerotic heart disease of native coronary artery without angina pectoris: Secondary | ICD-10-CM | POA: Diagnosis not present

## 2018-06-26 LAB — TROPONIN I
TROPONIN I: 0.07 ng/mL — AB (ref <0.03)
Troponin I: 0.08 ng/mL (ref <0.03)

## 2018-06-26 LAB — RENAL FUNCTION PANEL
Albumin: 3 g/dL — ABNORMAL LOW (ref 3.5–5.0)
Anion gap: 16 — ABNORMAL HIGH (ref 5–15)
BUN: 46 mg/dL — ABNORMAL HIGH (ref 8–23)
CALCIUM: 8.5 mg/dL — AB (ref 8.9–10.3)
CO2: 27 mmol/L (ref 22–32)
Chloride: 95 mmol/L — ABNORMAL LOW (ref 98–111)
Creatinine, Ser: 5.99 mg/dL — ABNORMAL HIGH (ref 0.61–1.24)
GFR calc Af Amer: 10 mL/min — ABNORMAL LOW (ref >60)
GFR calc non Af Amer: 8 mL/min — ABNORMAL LOW (ref >60)
Glucose, Bld: 84 mg/dL (ref 70–99)
Phosphorus: 7.8 mg/dL — ABNORMAL HIGH (ref 2.5–4.6)
Potassium: 5.2 mmol/L — ABNORMAL HIGH (ref 3.5–5.1)
SODIUM: 138 mmol/L (ref 135–145)

## 2018-06-26 LAB — CBC
HCT: 28.8 % — ABNORMAL LOW (ref 39.0–52.0)
Hemoglobin: 8.7 g/dL — ABNORMAL LOW (ref 13.0–17.0)
MCH: 27.6 pg (ref 26.0–34.0)
MCHC: 30.2 g/dL (ref 30.0–36.0)
MCV: 91.4 fL (ref 80.0–100.0)
Platelets: 185 10*3/uL (ref 150–400)
RBC: 3.15 MIL/uL — ABNORMAL LOW (ref 4.22–5.81)
RDW: 20.6 % — ABNORMAL HIGH (ref 11.5–15.5)
WBC: 6.3 10*3/uL (ref 4.0–10.5)
nRBC: 0 % (ref 0.0–0.2)

## 2018-06-26 MED ORDER — DIPHENHYDRAMINE HCL 12.5 MG/5ML PO ELIX
12.5000 mg | ORAL_SOLUTION | Freq: Once | ORAL | Status: AC
  Administered 2018-06-26: 12.5 mg via ORAL
  Filled 2018-06-26: qty 5

## 2018-06-26 MED ORDER — MIDODRINE HCL 5 MG PO TABS
10.0000 mg | ORAL_TABLET | Freq: Three times a day (TID) | ORAL | Status: DC
  Administered 2018-06-27: 10 mg via ORAL
  Filled 2018-06-26 (×6): qty 2

## 2018-06-26 MED ORDER — ISOSORBIDE MONONITRATE ER 30 MG PO TB24
30.0000 mg | ORAL_TABLET | Freq: Every day | ORAL | Status: DC
  Administered 2018-06-27: 30 mg via ORAL
  Filled 2018-06-26: qty 1

## 2018-06-26 MED ORDER — ALBUTEROL SULFATE (2.5 MG/3ML) 0.083% IN NEBU: 2.5000 mg | INHALATION_SOLUTION | RESPIRATORY_TRACT | Status: DC | PRN

## 2018-06-26 NOTE — Progress Notes (Addendum)
[place] [facility] [place] NAME: Jimmy Carson    MR#:  [id number]  DATE OF BIRTH:  Jan 19, 1941  SUBJECTIVE:  Patient is status post thoracentesis-500 cc of fluid drained off, patient feels slightly better, but continues to have significant swelling as well as intermittent shortness of breath, daughter at bedside, case discussed with nephrology, for hemodialysis this afternoon, may require repeat hemodialysis on tomorrow  REVIEW OF SYSTEMS:  CONSTITUTIONAL: No fever, fatigue or weakness.  EYES: No blurred or double vision.  EARS, NOSE, AND THROAT: No tinnitus or ear pain.  RESPIRATORY: No cough, shortness of breath, wheezing or hemoptysis.  CARDIOVASCULAR: No chest pain, orthopnea, edema.  GASTROINTESTINAL: No nausea, vomiting, diarrhea or abdominal pain.  GENITOURINARY: No dysuria, hematuria.  ENDOCRINE: No polyuria, nocturia,  HEMATOLOGY: No anemia, easy bruising or bleeding SKIN: No rash or lesion. MUSCULOSKELETAL: No joint pain or arthritis.   NEUROLOGIC: No tingling, numbness, weakness.  PSYCHIATRY: No anxiety or depression.   ROS  DRUG ALLERGIES:   Allergies  Allergen Reactions  . Orange Fruit [Citrus] Hives  . Penicillins Hives and Other (See Comments)    Has patient had a PCN reaction causing immediate rash, facial/tongue/throat swelling, SOB or lightheadedness with hypotension: Yes Has patient had a PCN reaction causing severe rash involving mucus membranes or skin necrosis: No Has patient had a PCN reaction that required hospitalization No Has patient had a PCN reaction occurring within the last 10 years: No If all of the above answers are "NO", then may proceed with Cephalosporin use.     VITALS:  Blood pressure (!) 77/44, pulse 78, temperature (!) 97.4 F (36.3 C), temperature source Oral, resp. rate 16, height 5\' 10"  (1.778 m), weight 78.9 kg, SpO2 100 %.  PHYSICAL EXAMINATION:  GENERAL:  78 y.o.-year-old patient  lying in the bed with no acute distress.  EYES: Pupils equal, round, reactive to light and accommodation. No scleral icterus. Extraocular muscles intact.  HEENT: Head atraumatic, normocephalic. Oropharynx and nasopharynx clear.  NECK:  Supple, no jugular venous distention. No thyroid enlargement, no tenderness.  LUNGS: Normal breath sounds bilaterally, no wheezing, rales,rhonchi or crepitation. No use of accessory muscles of respiration.  CARDIOVASCULAR: S1, S2 normal. No murmurs, rubs, or gallops.  ABDOMEN: Soft, nontender, nondistended. Bowel sounds present. No organomegaly or mass.  EXTREMITIES: No pedal edema, cyanosis, or clubbing.  NEUROLOGIC: Cranial nerves II through XII are intact. Muscle strength 5/5 in all extremities. Sensation intact. Gait not checked.  PSYCHIATRIC: The patient is alert and oriented x 3.  SKIN: No obvious rash, lesion, or ulcer.   Physical Exam LABORATORY PANEL:   CBC Recent Labs  Lab 06/26/18 1055  WBC 6.3  HGB 8.7*  HCT 28.8*  PLT 185   ------------------------------------------------------------------------------------------------------------------  Chemistries  Recent Labs  Lab 06/26/18 1055  NA 138  K 5.2*  CL 95*  CO2 27  GLUCOSE 84  BUN 46*  CREATININE 5.99*  CALCIUM 8.5*   ------------------------------------------------------------------------------------------------------------------  Cardiac Enzymes Recent Labs  Lab 06/26/18 0002 06/26/18 0532  TROPONINI 0.07* 0.08*   ------------------------------------------------------------------------------------------------------------------  RADIOLOGY:  Dg Chest 2 View  Result Date: 06/25/2018 CLINICAL DATA:  Shortness of breath EXAM: CHEST - 2 VIEW COMPARISON:  06/11/2018 FINDINGS: Post sternotomy changes with valve prosthesis. Small left-sided pleural effusion, slightly increased compared to prior. Trace right pleural effusion. Cardiomegaly with vascular congestion. Patchy airspace  disease at the left base. Aortic atherosclerosis. No pneumothorax. IMPRESSION: 1. Small left-sided pleural effusion, slight increase as compared  to radiograph 06/11/2018. Trace right pleural effusion 2. Cardiomegaly with vascular congestion. Patchy airspace disease at the left lung base. Electronically Signed   By: Donavan Foil M.D.   On: 06/25/2018 18:50   Dg Chest Port 1 View  Result Date: 06/26/2018 CLINICAL DATA:  Status post left-sided thoracentesis EXAM: PORTABLE CHEST 1 VIEW COMPARISON:  June 25, 2018 FINDINGS: No evident pneumothorax. There is a small at least partially loculated pleural effusion on the left. There is patchy consolidation in the left base. The right lung is clear. Heart is mildly enlarged with pulmonary vascularity normal. 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 There is aortic atherosclerosis. No evident adenopathy. There is postoperative change in the right shoulder with remodeling of the right humeral head, stable. IMPRESSION: No pneumothorax. Partially loculated pleural effusion left base with left base consolidation, likely atelectasis with potential superimposed pneumonia. Right lung clear. Stable cardiac silhouette. There is aortic atherosclerosis. Postoperative change in the right shoulder with remodeling right humeral head. Aortic Atherosclerosis (ICD10-I70.0). Electronically Signed   By: Lowella Grip III M.D.   On: 06/26/2018 10:05   US Thoracentesis Asp Pleural Space W/img Guide  Result Date: 06/26/2018 INDICATION: Shortness of breath and recurrent left pleural effusion. EXAM: ULTRASOUND GUIDED LEFT THORACENTESIS MEDICATIONS: None. COMPLICATIONS: None immediate. PROCEDURE: An ultrasound guided thoracentesis was thoroughly discussed with the patient and questions answered. The benefits, risks, alternatives and complications were also discussed. The patient understands and wishes to proceed with the procedure. Written  consent was obtained. Ultrasound was performed to localize and mark an adequate pocket of fluid in the left chest. The area was then prepped and draped in the normal sterile fashion. 1% Lidocaine was used for local anesthesia. Under ultrasound guidance a 6 Fr Safe-T-Centesis catheter was introduced. Thoracentesis was performed. The catheter was removed and a dressing applied. FINDINGS: A total of approximately 500 mL of amber fluid was removed. Small amount of residual fluid at end of the procedure. No additional fluid was removed due to patient discomfort. IMPRESSION: Successful ultrasound guided left thoracentesis yielding 500 mL of pleural fluid. Electronically Signed   By: Markus Daft M.D.   On: 06/26/2018 10:03    ASSESSMENT AND PLAN:  *Acute dyspnea Secondary to multifactorial process which includes heart failure exacerbation left pleural effusion Supplemental oxygen wean as tolerated  *Acute systolic congestive heart failure exacerbation  secondary to acute fluid overload state/end-stage renal disease Most recent echocardiogram noted for ejection fraction 20-25%, done December 2019 Nephrology consulted for hemodialysis needs-plan is for hemodialysis later this afternoon, possible repeat hemodialysis on tomorrow if necessary and discussion with nephrology, strict I&O monitoring, daily weights, fluid restriction, unable to tolerate beta-blocker/ACE inhibitor/ARB given relative hypotension  *Acute left pleural effusion Resolved Status post thoracentesis with 500 cc of fluid drained off, repeat chest x-ray in the morning  *Acute hypotension Avoid antihypertensives at this time, start Midodrine  *Chronic ESRD Nephrology consulted for hemodialysis needs   *COPD without exacerbation Breathing treatments PRN  Home on tomorrow barring any complications after repeat hemodialysis if deemed necessary   All the records are reviewed and case discussed with Care Management/Social  Workerr. Management plans discussed with the patient, family and they are in agreement.  CODE STATUS: full  TOTAL TIME TAKING CARE OF THIS PATIENT: 35 minutes.     POSSIBLE D/C IN 1 DAYS, DEPENDING ON CLINICAL CONDITION.   Avel Peace Salary M.D on 06/26/2018   Between 7am to 6pm - Pager - [phone]  After 6pm go to [email or web address] - password [email or web address]  Office  [phone]  CC: Primary care physician; Dion Body, MD  Note: This dictation was prepared with Dragon dictation along with smaller phrase technology. Any transcriptional errors that result from this process are unintentional.

## 2018-06-26 NOTE — Progress Notes (Signed)
Family Meeting Note  Advance Directive:yes  Today a meeting took place with the Patient.  Patient is able to participate   The following clinical team members were present during this meeting:MD  The following were discussed:Patient's diagnosis:chf, esrd , Patient's progosis: Unable to determine and Goals for treatment: Full Code  Additional follow-up to be provided: prn  Time spent during discussion:20 minutes  Gorden Harms, MD

## 2018-06-26 NOTE — Progress Notes (Signed)
Pre HD Assessment    06/26/18 1410  Neurological  Level of Consciousness Alert  Orientation Level Oriented X4  Respiratory  Respiratory Pattern Dyspnea with exertion;Symmetrical  Chest Assessment Chest expansion symmetrical  Bilateral Breath Sounds Diminished  Cough Non-productive;Weak  Cardiac  Pulse Regular  Heart Sounds S1, S2  Jugular Venous Distention (JVD) No  ECG Monitor Yes  Cardiac Rhythm NSR  Ectopy Unifocal PVC's  Ectopy Frequency Rare  Antiarrhythmic device No  Vascular  R Radial Pulse +2  L Radial Pulse +2  R Dorsalis Pedis Pulse +2  L Dorsalis Pedis Pulse +2  Edema Right lower extremity;Left lower extremity  RLE Edema Non-pitting  LLE Edema Non-Pitting  Integumentary  Integumentary (WDL) X  Skin Color Appropriate for ethnicity;Pink;Red  Skin Condition Dry  Skin Integrity Ecchymosis  Ecchymosis Location Arm;Chest;Leg  Ecchymosis Location Orientation Right;Left;Bilateral  Musculoskeletal  Musculoskeletal (WDL) X  Generalized Weakness Yes  GU Assessment  Genitourinary (WDL) X (HD pt)  Genitourinary Symptoms Oliguria  Psychosocial  Psychosocial (WDL) WDL

## 2018-06-26 NOTE — Progress Notes (Signed)
HD Treatment Complete    06/26/18 1830  Vital Signs  Pulse Rate 82  Pulse Rate Source Monitor  Resp 14  BP (!) 117/52  BP Location Right Arm  BP Method Automatic  Patient Position (if appropriate) Lying  Oxygen Therapy  SpO2 100 %  O2 Device Nasal Cannula  O2 Flow Rate (L/min) 2 L/min  During Hemodialysis Assessment  Blood Flow Rate (mL/min) 400 mL/min  Arterial Pressure (mmHg) -170 mmHg  Venous Pressure (mmHg) 210 mmHg  Transmembrane Pressure (mmHg) 70 mmHg  Ultrafiltration Rate (mL/min) 1000 mL/min  Dialysate Flow Rate (mL/min) 600 ml/min  Conductivity: Machine  13.6  HD Safety Checks Performed Yes  Intra-Hemodialysis Comments Progressing as prescribed (UF 3546)  Fistula / Graft Left Upper arm Arteriovenous fistula  No Placement Date or Time found.   Orientation: Left  Access Location: Upper arm  Access Type: Arteriovenous fistula  Status Deaccessed

## 2018-06-26 NOTE — Progress Notes (Signed)
Pre HD Treatment    06/26/18 1415  Vital Signs  Temp (!) 97.5 F (36.4 C)  Temp Source Oral  Pulse Rate 77  Pulse Rate Source Monitor  Resp (!) 22  BP 108/78  BP Location Right Arm  BP Method Automatic  Patient Position (if appropriate) Lying  Oxygen Therapy  SpO2 95 %  O2 Device Room Air  Pain Assessment  Pain Scale 0-10  Pain Score 0  Dialysis Weight  Weight 81 kg  Type of Weight Pre-Dialysis  Time-Out for Hemodialysis  What Procedure? HD  Pt Identifiers(min of two) First/Last Name;MRN/Account#;Pt's DOB(use if MRN/Acct# not available  Correct Site? Yes  Correct Side? Yes  Correct Procedure? Yes  Consents Verified? Yes  Rad Studies Available? N/A  Safety Precautions Reviewed? Yes  Engineer, civil (consulting) Number 5  Station Number 3  UF/Alarm Test Passed  Conductivity: Meter 14  Conductivity: Machine  14  pH 7.2  Reverse Osmosis Main  Normal Saline Lot Number [id number]  Dialyzer Lot Number [id number]  Disposable Set Lot Number [id number]  Machine Temperature 98.6 F (37 C)  Musician and Audible Yes  Blood Lines Intact and Secured Yes  Pre Treatment Patient Checks  Vascular access used during treatment Fistula  Hepatitis B Surface Antigen Results Negative  Date Hepatitis B Surface Antigen Drawn 04/29/18  Hepatitis B Surface Antibody 600  Date Hepatitis B Surface Antibody Drawn 02/25/18  Hemodialysis Consent Verified Yes  Hemodialysis Standing Orders Initiated Yes  ECG (Telemetry) Monitor On Yes  Prime Ordered Normal Saline  Length of  DialysisTreatment -hour(s) 3.5 Hour(s)  Dialysis Treatment Comments Na 140  Dialyzer Elisio 17H NR  Dialysate 2K, 2.5 Ca  Variable Sodium Other (Comment)  Dialysis Anticoagulant None  Dialysate Flow Ordered 600  Blood Flow Rate Ordered 400 mL/min  Ultrafiltration Goal 3 Liters  Dialysis Blood Pressure Support Ordered Normal Saline  Education / Care Plan  Dialysis Education Provided Yes  Documented Education in Care  Plan Yes  Fistula / Graft Left Upper arm Arteriovenous fistula  No Placement Date or Time found.   Orientation: Left  Access Location: Upper arm  Access Type: Arteriovenous fistula  Site Condition No complications  Fistula / Graft Assessment Present;Thrill;Bruit  Drainage Description None

## 2018-06-26 NOTE — H&P (Signed)
[facility] [place] NAME: Jimmy Carson    MR#:  [id number]  DATE OF BIRTH:  [birth year]  DATE OF ADMISSION:  06/25/2018  PRIMARY CARE PHYSICIAN: Dion Body, MD   REQUESTING/REFERRING PHYSICIAN:   CHIEF COMPLAINT:   Chief Complaint  Patient presents with  . Shortness of Breath    HISTORY OF PRESENT ILLNESS: Jimmy Carson  is a 78 y.o. male with a known history per below, which includes end-stage renal disease on dialysis Mondays/Wednesdays/Fridays-last dialysis was on Tuesday due to holiday schedule, presenting with shortness of breath, dyspnea on exertion, chest x-ray noted for edema/small left pleural effusion/patchy airspace disease, hemoglobin stable at 7.9, patient valuated emergency room, family at the bedside, patient is now been admitted for acute dyspnea most likely secondary to acute fluid overload state from end-stage renal disease >>>> small left pleural effusion  PAST MEDICAL HISTORY:   Past Medical History:  Diagnosis Date  . Anemia   . CAD (coronary artery disease)    s/p percutaneous coronary angioplasty, s/p CABG x 6   . ESRD (end stage renal disease) ([place])   . Heart attack ([place])   . History of radiation therapy    for prostate CA  . Hyperlipidemia   . Hyperparathyroidism, secondary renal ([place])   . Hypertension   . Osteoarthritis   . Polycystic kidney disease   . Prostate CA [facility]) 2012   Brachytherapy  . Renal insufficiency    currently on dialysis  . Shingles     PAST SURGICAL HISTORY:  Past Surgical History:  Procedure Laterality Date  . AORTIC VALVE REPLACEMENT    . CARDIAC CATHETERIZATION N/A 07/24/2015   Procedure: Coronary/Graft Angiography;  Surgeon: Corey Skains, MD;  Location: [facility];  Service: Cardiovascular;  Laterality: N/A;  . CARDIAC CATHETERIZATION N/A 06/03/2016   Procedure: Left Heart Cath and Coronary Angiography;  Surgeon: Isaias Cowman, MD;   Location: [facility];  Service: Cardiovascular;  Laterality: N/A;  . CORONARY ARTERY BYPASS GRAFT    . HERNIA REPAIR    . JOINT REPLACEMENT    . REPLACEMENT TOTAL KNEE BILATERAL    . ROTATOR CUFF REPAIR Right   . TOTAL KNEE REVISION Right 111/07/202019   Procedure: TOTAL KNEE REVISION;  Surgeon: Dereck Leep, MD;  Location: [facility] ORS;  Service: Orthopedics;  Laterality: Right;    SOCIAL HISTORY:  Social History   Tobacco Use  . Smoking status: Former Smoker    Last attempt to quit: 04/19/2013    Years since quitting: 5.1  . Smokeless tobacco: Never Used  Substance Use Topics  . Alcohol use: No    FAMILY HISTORY:  Family History  Problem Relation Age of Onset  . Polycystic kidney disease Father   . Aneurysm Father     DRUG ALLERGIES:  Allergies  Allergen Reactions  . Orange Fruit [Citrus] Hives  . Penicillins Hives and Other (See Comments)    Has patient had a PCN reaction causing immediate rash, facial/tongue/throat swelling, SOB or lightheadedness with hypotension: Yes Has patient had a PCN reaction causing severe rash involving mucus membranes or skin necrosis: No Has patient had a PCN reaction that required hospitalization No Has patient had a PCN reaction occurring within the last 10 years: No If all of the above answers are "NO", then may proceed with Cephalosporin use.     REVIEW OF SYSTEMS:   CONSTITUTIONAL: No fever, fatigue or weakness.  EYES: No blurred or double  vision.  EARS, NOSE, AND THROAT: No tinnitus or ear pain.  RESPIRATORY: No cough, +shortness of breath, no wheezing or hemoptysis.  CARDIOVASCULAR: No chest pain, orthopnea, +edema.  GASTROINTESTINAL: No nausea, vomiting, diarrhea or abdominal pain.  GENITOURINARY: No dysuria, hematuria.  ENDOCRINE: No polyuria, nocturia,  HEMATOLOGY: No anemia, easy bruising or bleeding SKIN: No rash or lesion. MUSCULOSKELETAL: No joint pain or arthritis.   NEUROLOGIC: No tingling, numbness,  weakness.  PSYCHIATRY: No anxiety or depression.   MEDICATIONS AT HOME:  Prior to Admission medications   Medication Sig Start Date End Date Taking? Authorizing Provider  acetaminophen (TYLENOL) 500 MG tablet Take 1,000 mg by mouth every 6 (six) hours as needed for mild pain or headache.    Yes [provider]  cephALEXin (KEFLEX) 500 MG capsule Take 500 mg by mouth 2 (two) times daily.   Yes [provider]  cinacalcet (SENSIPAR) 60 MG tablet Take 60 mg by mouth daily.    Yes [provider]  clopidogrel (PLAVIX) 75 MG tablet Take 75 mg by mouth daily.   Yes [provider]  isosorbide mononitrate (IMDUR) 60 MG 24 hr tablet Take 60 mg by mouth daily.   Yes [provider]  nitroGLYCERIN (NITROSTAT) 0.4 MG SL tablet Place 1 tablet (0.4 mg total) under the tongue every 5 (five) minutes as needed for chest pain. 06/03/16  Yes Max Sane, MD  pyridOXINE (VITAMIN B-6) 100 MG tablet Take 100 mg by mouth daily.   Yes [provider]  rosuvastatin (CRESTOR) 10 MG tablet Take 10 mg by mouth daily.   Yes [provider]  sevelamer carbonate (RENVELA) 800 MG tablet Take 1,600-2,400 mg by mouth See admin instructions. Take 2400 mg by mouth 3 times daily with meals and take 1600 mg by mouth with snacks   Yes [provider]      PHYSICAL EXAMINATION:   VITAL SIGNS: Blood pressure 93/63, pulse 71, temperature 97.7 F (36.5 C), temperature source Oral, resp. rate 16, height 5\' 10"  (1.778 m), weight 78.9 kg, SpO2 99 %.  GENERAL:  78 y.o.-year-old patient lying in the bed with no acute distress.  EYES: Pupils equal, round, reactive to light and accommodation. No scleral icterus. Extraocular muscles intact.  HEENT: Head atraumatic, normocephalic. Oropharynx and nasopharynx clear.  NECK:  Supple, no jugular venous distention. No thyroid enlargement, no tenderness.  LUNGS: Rales bilaterally with diminished breath sounds . No use of  accessory muscles of respiration.  CARDIOVASCULAR: S1, S2 normal. No murmurs, rubs, or gallops.  ABDOMEN: Soft, nontender, nondistended. Bowel sounds present. No organomegaly or mass.  EXTREMITIES: + edema,no cyanosis, or clubbing.  NEUROLOGIC: Cranial nerves II through XII are intact. Muscle strength 5/5 in all extremities. Sensation intact. Gait not checked.  PSYCHIATRIC: The patient is alert and oriented x 3.  SKIN: No obvious rash, lesion, or ulcer.   LABORATORY PANEL:   CBC Recent Labs  Lab 06/25/18 1806  WBC 6.3  HGB 7.9*  HCT 26.5*  PLT 169  MCV 92.0  MCH 27.4  MCHC 29.8*  RDW 20.3*   ------------------------------------------------------------------------------------------------------------------  Chemistries  Recent Labs  Lab 06/25/18 1806  NA 136  K 4.9  CL 94*  CO2 28  GLUCOSE 102*  BUN 40*  CREATININE 5.27*  CALCIUM 8.4*   ------------------------------------------------------------------------------------------------------------------ estimated creatinine clearance is 12.3 mL/min (A) (by C-G formula based on SCr of 5.27 mg/dL (H)). ------------------------------------------------------------------------------------------------------------------ No results for input(s): TSH, T4TOTAL, T3FREE, THYROIDAB in the last 72 hours.  Invalid  input(s): FREET3   Coagulation profile No results for input(s): INR, PROTIME in the last 168 hours. ------------------------------------------------------------------------------------------------------------------- No results for input(s): DDIMER in the last 72 hours. -------------------------------------------------------------------------------------------------------------------  Cardiac Enzymes Recent Labs  Lab 06/25/18 1806 06/26/18 0002  TROPONINI 0.07* 0.07*   ------------------------------------------------------------------------------------------------------------------ Invalid input(s):  POCBNP  ---------------------------------------------------------------------------------------------------------------  Urinalysis    Component Value Date/Time   COLORURINE YELLOW (A) 06/05/2018 0826   APPEARANCEUR CLOUDY (A) 06/05/2018 0826   LABSPEC 1.011 06/05/2018 0826   PHURINE 9.0 (H) 06/05/2018 0826   GLUCOSEU 50 (A) 06/05/2018 0826   HGBUR MODERATE (A) 06/05/2018 0826   BILIRUBINUR NEGATIVE 06/05/2018 0826   KETONESUR NEGATIVE 06/05/2018 0826   PROTEINUR 100 (A) 06/05/2018 0826   NITRITE NEGATIVE 06/05/2018 0826   LEUKOCYTESUR MODERATE (A) 06/05/2018 0826     RADIOLOGY: Dg Chest 2 View  Result Date: 06/25/2018 CLINICAL DATA:  Shortness of breath EXAM: CHEST - 2 VIEW COMPARISON:  06/11/2018 FINDINGS: Post sternotomy changes with valve prosthesis. Small left-sided pleural effusion, slightly increased compared to prior. Trace right pleural effusion. Cardiomegaly with vascular congestion. Patchy airspace disease at the left base. Aortic atherosclerosis. No pneumothorax. IMPRESSION: 1. Small left-sided pleural effusion, slight increase as compared to radiograph 06/11/2018. Trace right pleural effusion 2. Cardiomegaly with vascular congestion. Patchy airspace disease at the left lung base. Electronically Signed   By: Donavan Foil M.D.   On: 06/25/2018 18:50    EKG: Orders placed or performed during the hospital encounter of 06/25/18  . ED EKG within 10 minutes  . ED EKG within 10 minutes  . EKG 12-Lead  . EKG 12-Lead    IMPRESSION AND PLAN: *Acute systolic congestive heart failure exacerbation secondary to acute fluid overload state/end-stage renal disease *Chronic end-stage renal disease *Acute small left pleural effusion *COPD without exacerbation  Admit to regular nursing floor bed, supplemental oxygen with weaning as tolerated, consult nephrology for hemodialysis in the morning, hold Plavix for now-consult IR for possible thoracentesis, strict I&O monitoring, daily  weights, and continue close medical monitoring   All the records are reviewed and case discussed with ED provider. Management plans discussed with the patient, family and they are in agreement.  CODE STATUS:full    Code Status Orders  (From admission, onward)         Start     Ordered   06/25/18 2325  Full code  Continuous     06/25/18 2324        Code Status History    Date Active Date Inactive Code Status Order ID Comments User Context   06/04/2018 0013 06/06/2018 1457 Full Code [id number]  Lance Coon, MD Inpatient   1October 09, 202019 1526 05/26/2018 2139 Full Code [id number]  Dereck Leep, MD Inpatient   08/30/2017 0207 08/31/2017 1417 Full Code [id number]  Lance Coon, MD Inpatient   05/31/2016 2036 06/04/2016 0053 Full Code [id number]  Henreitta Leber, MD ED   07/22/2015 0002 07/24/2015 2225 Full Code [id number]  Harrie Foreman, MD ED    Advance Directive Documentation     Most Recent Value  Type of Advance Directive  Living will  Pre-existing out of facility DNR order (yellow form or pink MOST form)  -  "MOST" Form in Place?  -       TOTAL TIME TAKING CARE OF THIS PATIENT: 40 minutes.    Avel Peace Abron Neddo M.D on 06/26/2018   Between 7am to 6pm - Pager - [phone]  After 6pm go to [email or web address] - [email or web address]  [facility]  Office  [phone]  CC: Primary care physician; Dion Body, MD   Note: This dictation was prepared with Dragon dictation along with smaller phrase technology. Any transcriptional errors that result from this process are unintentional.

## 2018-06-26 NOTE — Procedures (Signed)
Interventional Radiology Procedure:   Indications: SOB and recurrent left pleural effusion  Procedure: US guided left thoracentesis  Findings: Mildly complex left pleural fluid.   Removed 500 ml of amber fluid, stopped procedure due to patient discomfort.    Complications: None     EBL: Minimal  Plan: CXR ordered.     Auden Tatar R. Anselm Pancoast, MD  Pager: [phone]

## 2018-06-26 NOTE — Progress Notes (Signed)
Palliative:  Palliative unable to complete consult as patient is currently in hemodialysis. Will not be available until Monday 06/29/18. If patient d/c prior to Monday please recommend outpatient palliative to follow up in d/c summary. Thank you for this consult.   No charge  Vinie Sill, NP Palliative Medicine Team Pager # [phone] (M-F 8a-5p) Team Phone # [phone] (Nights/Weekends)

## 2018-06-26 NOTE — Care Management Obs Status (Signed)
[place] NOTIFICATION   Patient Details  Name: Jimmy Carson MRN: [id number] Date of Birth: August 26, 1940   Medicare Observation Status Notification Given:  No(patient off the floor for HD)    Beverly Sessions, RN 06/26/2018, 2:56 PM

## 2018-06-26 NOTE — Progress Notes (Signed)
[facility]  ROUNDING NOTE   Subjective:   Daughter at bedside.   Left Thoracentesis  534mL this morning. Patient states he is breathing better.   Objective:  Vital signs in last 24 hours:  Temp:  [97.4 F (36.3 C)-97.7 F (36.5 C)] 97.4 F (36.3 C) (01/03 0421) Pulse Rate:  [70-81] 76 (01/03 0939) Resp:  [16-31] 20 (01/03 0939) BP: (93-119)/(57-74) 109/58 (01/03 0939) SpO2:  [97 %-100 %] 98 % (01/03 0939) Weight:  [77.6 kg-78.9 kg] 78.9 kg (01/03 0421)  Weight change:  Filed Weights   06/25/18 2328 06/25/18 2336 06/26/18 0421  Weight: 77.6 kg 78.9 kg 78.9 kg    Intake/Output: No intake/output data recorded.   Intake/Output this shift:  No intake/output data recorded.  Physical Exam: General: NAD,   Head: Normocephalic, atraumatic. Moist oral mucosal membranes  Eyes: Anicteric, PERRL  Neck: Supple, trachea midline  Lungs:  Clear to auscultation  Heart: Regular rate and rhythm  Abdomen:  Soft, nontender,   Extremities:  1+ peripheral edema.    Neurologic: Nonfocal, moving all four extremities  Skin: No lesions  Access: Left AVF    Basic Metabolic Panel: Recent Labs  Lab 06/25/18 1806 06/26/18 1055  NA 136 138  K 4.9 5.2*  CL 94* 95*  CO2 28 27  GLUCOSE 102* 84  BUN 40* 46*  CREATININE 5.27* 5.99*  CALCIUM 8.4* 8.5*  PHOS  --  7.8*    Liver Function Tests: Recent Labs  Lab 06/26/18 1055  ALBUMIN 3.0*   No results for input(s): LIPASE, AMYLASE in the last 168 hours. No results for input(s): AMMONIA in the last 168 hours.  CBC: Recent Labs  Lab 06/25/18 1806 06/26/18 1055  WBC 6.3 6.3  HGB 7.9* 8.7*  HCT 26.5* 28.8*  MCV 92.0 91.4  PLT 169 185    Cardiac Enzymes: Recent Labs  Lab 06/25/18 1806 06/26/18 0002 06/26/18 0532  TROPONINI 0.07* 0.07* 0.08*    BNP: Invalid input(s): POCBNP  CBG: No results for input(s): GLUCAP in the last 168 hours.  Microbiology: Results for orders placed or performed during the  hospital encounter of 06/14/18  Aerobic/Anaerobic Culture (surgical/deep wound)     Status: None   Collection Time: 06/14/18  5:57 PM  Result Value Ref Range Status   Specimen Description WOUND  Final   Special Requests RIGHT KNEE JOINT  Final   Gram Stain   Final    RARE WBC PRESENT, PREDOMINANTLY PMN RARE GRAM POSITIVE COCCI IN PAIRS    Culture   Final    FEW NORMAL SKIN FLORA NO ANAEROBES ISOLATED Performed at [facility], [place] [address], [place], [place] [address]    Report Status 06/19/2018 FINAL  Final    Coagulation Studies: No results for input(s): LABPROT, INR in the last 72 hours.  Urinalysis: No results for input(s): COLORURINE, LABSPEC, PHURINE, GLUCOSEU, HGBUR, BILIRUBINUR, KETONESUR, PROTEINUR, UROBILINOGEN, NITRITE, LEUKOCYTESUR in the last 72 hours.  Invalid input(s): APPERANCEUR    Imaging: Dg Chest 2 View  Result Date: 06/25/2018 CLINICAL DATA:  Shortness of breath EXAM: CHEST - 2 VIEW COMPARISON:  06/11/2018 FINDINGS: Post sternotomy changes with valve prosthesis. Small left-sided pleural effusion, slightly increased compared to prior. Trace right pleural effusion. Cardiomegaly with vascular congestion. Patchy airspace disease at the left base. Aortic atherosclerosis. No pneumothorax. IMPRESSION: 1. Small left-sided pleural effusion, slight increase as compared to radiograph 06/11/2018. Trace right pleural effusion 2. Cardiomegaly with vascular congestion. Patchy airspace disease at the left  lung base. Electronically Signed   By: Donavan Foil M.D.   On: 06/25/2018 18:50   Dg Chest Port 1 View  Result Date: 06/26/2018 CLINICAL DATA:  Status post left-sided thoracentesis EXAM: PORTABLE CHEST 1 VIEW COMPARISON:  June 25, 2018 FINDINGS: No evident pneumothorax. There is a small at least partially loculated pleural effusion on the left. There is patchy consolidation in the left base. The right lung is clear. Heart is mildly enlarged with pulmonary vascularity  normal. 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 There is aortic atherosclerosis. No evident adenopathy. There is postoperative change in the right shoulder with remodeling of the right humeral head, stable. IMPRESSION: No pneumothorax. Partially loculated pleural effusion left base with left base consolidation, likely atelectasis with potential superimposed pneumonia. Right lung clear. Stable cardiac silhouette. There is aortic atherosclerosis. Postoperative change in the right shoulder with remodeling right humeral head. Aortic Atherosclerosis (ICD10-I70.0). Electronically Signed   By: Lowella Grip III M.D.   On: 06/26/2018 10:05   US Thoracentesis Asp Pleural Space W/img Guide  Result Date: 06/26/2018 INDICATION: Shortness of breath and recurrent left pleural effusion. EXAM: ULTRASOUND GUIDED LEFT THORACENTESIS MEDICATIONS: None. COMPLICATIONS: None immediate. PROCEDURE: An ultrasound guided thoracentesis was thoroughly discussed with the patient and questions answered. The benefits, risks, alternatives and complications were also discussed. The patient understands and wishes to proceed with the procedure. Written consent was obtained. Ultrasound was performed to localize and mark an adequate pocket of fluid in the left chest. The area was then prepped and draped in the normal sterile fashion. 1% Lidocaine was used for local anesthesia. Under ultrasound guidance a 6 Fr Safe-T-Centesis catheter was introduced. Thoracentesis was performed. The catheter was removed and a dressing applied. FINDINGS: A total of approximately 500 mL of amber fluid was removed. Small amount of residual fluid at end of the procedure. No additional fluid was removed due to patient discomfort. IMPRESSION: Successful ultrasound guided left thoracentesis yielding 500 mL of pleural fluid. Electronically Signed   By: Markus Daft M.D.   On: 06/26/2018 10:03     Medications:   . sodium  chloride     . cephALEXin  500 mg Oral BID  . cinacalcet  60 mg Oral Q breakfast  . heparin  5,000 Units Subcutaneous Q8H  . isosorbide mononitrate  60 mg Oral Daily  . pyridOXINE  100 mg Oral Daily  . rosuvastatin  10 mg Oral Daily  . sevelamer carbonate  2,400 mg Oral TID WC  . sodium chloride flush  3 mL Intravenous Q12H   sodium chloride, acetaminophen, albuterol, nitroGLYCERIN, ondansetron **OR** ondansetron (ZOFRAN) IV, sodium chloride flush  Assessment/ Plan:  [name] is a 78 y.o. white male 78 y.o. white male with ESRD, hypothyroidism, hypertension, BPH, CAD status post CABG, hyperlipidemia, right total knee revision arthroplasty May 22, 2018 Dr. Marry Guan  [facility] [address] nephrology/MWF  1.  End-stage renal disease: dialysis for today. Orders prepared.  Continue MWF schedule.   2.  Anemia of chronic kidney disease: hemoglobin 8.7 Continue mircera as outpatient.   3.  Secondary hyperparathyroidism with hyperphosphatemia.  Continue Renvela with meals. Cinacalcet daily  4. Hypertension: blood pressure at goal.  - isosorbide mononitrate.    LOS: 0 Saige Busby 1/3/202011:49 AM

## 2018-06-26 NOTE — Progress Notes (Signed)
Post HD Treatment  Pt tolerated treatment well. His net UF was 3546 and his BVP was 79.4. All blood was returned to patient. Report taken by Lum Babe, RN. Primary RN unavailable.     06/26/18 [place]  Hand-Off documentation  Report given to (Full Name) Lum Babe, RN  Report received from (Full Name) Stephannie Peters, RN  Vital Signs  Temp (!) 97.5 F (36.4 C)  Temp Source Oral  Pulse Rate 81  Pulse Rate Source Monitor  Resp 14  BP 110/61  BP Location Right Arm  BP Method Automatic  Patient Position (if appropriate) Lying  Oxygen Therapy  SpO2 100 %  O2 Device Nasal Cannula  O2 Flow Rate (L/min) 2 L/min  Pain Assessment  Pain Scale 0-10  Pain Score 0  Dialysis Weight  Weight 78 kg  Type of Weight Post-Dialysis  Post-Hemodialysis Assessment  Rinseback Volume (mL) 250 mL  KECN 249 V  Dialyzer Clearance Lightly streaked  Duration of HD Treatment -hour(s) 3.5 hour(s)  Hemodialysis Intake (mL) 500 mL  UF Total -Machine (mL) 3546 mL  Net UF (mL) 3046 mL  Tolerated HD Treatment Yes  AVG/AVF Arterial Site Held (minutes) 10 minutes  AVG/AVF Venous Site Held (minutes) 10 minutes  Fistula / Graft Left Upper arm Arteriovenous fistula  No Placement Date or Time found.   Orientation: Left  Access Location: Upper arm  Access Type: Arteriovenous fistula  Site Condition No complications  Fistula / Graft Assessment Present;Thrill;Bruit  Drainage Description None

## 2018-06-26 NOTE — Progress Notes (Signed)
Post HD Assessment    06/26/18 1839  Neurological  Level of Consciousness Alert  Orientation Level Oriented X4  Respiratory  Respiratory Pattern Regular;Unlabored;Symmetrical  Chest Assessment Chest expansion symmetrical  Bilateral Breath Sounds Diminished  Cough Non-productive  Cardiac  Pulse Regular  Heart Sounds S1, S2  Jugular Venous Distention (JVD) No  ECG Monitor Yes  Cardiac Rhythm NSR  Ectopy Unifocal PVC's  Ectopy Frequency Rare  Antiarrhythmic device No  Vascular  R Radial Pulse +2  L Radial Pulse +2  R Dorsalis Pedis Pulse +2  L Dorsalis Pedis Pulse +2  Edema Right lower extremity;Left lower extremity  RLE Edema Non-pitting  LLE Edema Non-Pitting  Integumentary  Integumentary (WDL) X  Skin Color Appropriate for ethnicity;Pink;Red  Skin Condition Dry  Skin Integrity Ecchymosis  Ecchymosis Location Arm;Chest;Leg  Ecchymosis Location Orientation Right;Left;Bilateral  Musculoskeletal  Musculoskeletal (WDL) X  Generalized Weakness Yes  GU Assessment  Genitourinary (WDL) X (HD pt)  Genitourinary Symptoms Oliguria  Psychosocial  Psychosocial (WDL) WDL

## 2018-06-26 NOTE — Care Management (Signed)
Amanda Morris dialysis liaison notified of admission.    

## 2018-06-27 ENCOUNTER — Inpatient Hospital Stay: Payer: Medicare Other

## 2018-06-27 DIAGNOSIS — I132 Hypertensive heart and chronic kidney disease with heart failure and with stage 5 chronic kidney disease, or end stage renal disease: Secondary | ICD-10-CM | POA: Diagnosis not present

## 2018-06-27 MED ORDER — MIDODRINE HCL 10 MG PO TABS: 10.0000 mg | ORAL_TABLET | Freq: Three times a day (TID) | ORAL | 0 refills | Status: AC

## 2018-06-27 NOTE — Progress Notes (Signed)
[facility]  ROUNDING NOTE   Subjective:   Daughter at bedside.   Hemodialysis treatment yesterday. Tolerated treatment. UF of 3 liters  Objective:  Vital signs in last 24 hours:  Temp:  [97.4 F (36.3 C)-98.2 F (36.8 C)] 97.7 F (36.5 C) (01/04 0509) Pulse Rate:  [68-90] 75 (01/04 0508) Resp:  [14-23] 18 (01/04 0508) BP: (73-123)/(44-93) 97/85 (01/04 0508) SpO2:  [77 %-100 %] 100 % (01/04 0508) Weight:  [75.6 kg-81 kg] 75.6 kg (01/04 0609)  Weight change: 3.4 kg Filed Weights   06/26/18 1415 06/26/18 1835 06/27/18 0609  Weight: 81 kg 78 kg 75.6 kg    Intake/Output: I/O last 3 completed shifts: In: -  Out: 1062 [Other:3046]   Intake/Output this shift:  Total I/O In: 120 [P.O.:120] Out: -   Physical Exam: General: NAD,   Head: Normocephalic, atraumatic. Moist oral mucosal membranes  Eyes: Anicteric, PERRL  Neck: Supple, trachea midline  Lungs:  Clear to auscultation  Heart: Regular rate and rhythm  Abdomen:  Soft, nontender,   Extremities:  trace peripheral edema.  Left knee in brace  Neurologic: Nonfocal, moving all four extremities  Skin: No lesions  Access: Left AVF    Basic Metabolic Panel: Recent Labs  Lab 06/25/18 1806 06/26/18 1055  NA 136 138  K 4.9 5.2*  CL 94* 95*  CO2 28 27  GLUCOSE 102* 84  BUN 40* 46*  CREATININE 5.27* 5.99*  CALCIUM 8.4* 8.5*  PHOS  --  7.8*    Liver Function Tests: Recent Labs  Lab 06/26/18 1055  ALBUMIN 3.0*   No results for input(s): LIPASE, AMYLASE in the last 168 hours. No results for input(s): AMMONIA in the last 168 hours.  CBC: Recent Labs  Lab 06/25/18 1806 06/26/18 1055  WBC 6.3 6.3  HGB 7.9* 8.7*  HCT 26.5* 28.8*  MCV 92.0 91.4  PLT 169 185    Cardiac Enzymes: Recent Labs  Lab 06/25/18 1806 06/26/18 0002 06/26/18 0532  TROPONINI 0.07* 0.07* 0.08*    BNP: Invalid input(s): POCBNP  CBG: No results for input(s): GLUCAP in the last 168  hours.  Microbiology: Results for orders placed or performed during the hospital encounter of 06/14/18  Aerobic/Anaerobic Culture (surgical/deep wound)     Status: None   Collection Time: 06/14/18  5:57 PM  Result Value Ref Range Status   Specimen Description WOUND  Final   Special Requests RIGHT KNEE JOINT  Final   Gram Stain   Final    RARE WBC PRESENT, PREDOMINANTLY PMN RARE GRAM POSITIVE COCCI IN PAIRS    Culture   Final    FEW NORMAL SKIN FLORA NO ANAEROBES ISOLATED Performed at [facility], [place] [address], [place], [place] [address]    Report Status 06/19/2018 FINAL  Final    Coagulation Studies: No results for input(s): LABPROT, INR in the last 72 hours.  Urinalysis: No results for input(s): COLORURINE, LABSPEC, PHURINE, GLUCOSEU, HGBUR, BILIRUBINUR, KETONESUR, PROTEINUR, UROBILINOGEN, NITRITE, LEUKOCYTESUR in the last 72 hours.  Invalid input(s): APPERANCEUR    Imaging: Dg Chest 2 View  Result Date: 06/27/2018 CLINICAL DATA:  Left-sided pleural effusion. Follow-up. EXAM: CHEST - 2 VIEW COMPARISON:  06/26/2018 FINDINGS: Previous median sternotomy and CABG. Previous aortic valve replacement. Right chest remains clear. Persistent left effusion with atelectasis and or infiltrate in the left lower lung. Findings may be slightly worsened on the left. IMPRESSION: Persistent left effusion with atelectasis and or infiltrate in the left lower lung. Findings may  be slightly worsened on the left. Electronically Signed   By: Nelson Chimes M.D.   On: 06/27/2018 08:15   Dg Chest 2 View  Result Date: 06/25/2018 CLINICAL DATA:  Shortness of breath EXAM: CHEST - 2 VIEW COMPARISON:  06/11/2018 FINDINGS: Post sternotomy changes with valve prosthesis. Small left-sided pleural effusion, slightly increased compared to prior. Trace right pleural effusion. Cardiomegaly with vascular congestion. Patchy airspace disease at the left base. Aortic atherosclerosis. No pneumothorax. IMPRESSION: 1.  Small left-sided pleural effusion, slight increase as compared to radiograph 06/11/2018. Trace right pleural effusion 2. Cardiomegaly with vascular congestion. Patchy airspace disease at the left lung base. Electronically Signed   By: Donavan Foil M.D.   On: 06/25/2018 18:50   Dg Chest Port 1 View  Result Date: 06/26/2018 CLINICAL DATA:  Status post left-sided thoracentesis EXAM: PORTABLE CHEST 1 VIEW COMPARISON:  June 25, 2018 FINDINGS: No evident pneumothorax. There is a small at least partially loculated pleural effusion on the left. There is patchy consolidation in the left base. The right lung is clear. Heart is mildly enlarged with pulmonary vascularity normal. 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 There is aortic atherosclerosis. No evident adenopathy. There is postoperative change in the right shoulder with remodeling of the right humeral head, stable. IMPRESSION: No pneumothorax. Partially loculated pleural effusion left base with left base consolidation, likely atelectasis with potential superimposed pneumonia. Right lung clear. Stable cardiac silhouette. There is aortic atherosclerosis. Postoperative change in the right shoulder with remodeling right humeral head. Aortic Atherosclerosis (ICD10-I70.0). Electronically Signed   By: Lowella Grip III M.D.   On: 06/26/2018 10:05   US Thoracentesis Asp Pleural Space W/img Guide  Result Date: 06/26/2018 INDICATION: Shortness of breath and recurrent left pleural effusion. EXAM: ULTRASOUND GUIDED LEFT THORACENTESIS MEDICATIONS: None. COMPLICATIONS: None immediate. PROCEDURE: An ultrasound guided thoracentesis was thoroughly discussed with the patient and questions answered. The benefits, risks, alternatives and complications were also discussed. The patient understands and wishes to proceed with the procedure. Written consent was obtained. Ultrasound was performed to localize and mark an adequate pocket  of fluid in the left chest. The area was then prepped and draped in the normal sterile fashion. 1% Lidocaine was used for local anesthesia. Under ultrasound guidance a 6 Fr Safe-T-Centesis catheter was introduced. Thoracentesis was performed. The catheter was removed and a dressing applied. FINDINGS: A total of approximately 500 mL of amber fluid was removed. Small amount of residual fluid at end of the procedure. No additional fluid was removed due to patient discomfort. IMPRESSION: Successful ultrasound guided left thoracentesis yielding 500 mL of pleural fluid. Electronically Signed   By: Markus Daft M.D.   On: 06/26/2018 10:03     Medications:   . sodium chloride     . cephALEXin  500 mg Oral BID  . cinacalcet  60 mg Oral Q breakfast  . heparin  5,000 Units Subcutaneous Q8H  . isosorbide mononitrate  30 mg Oral Daily  . midodrine  10 mg Oral TID WC  . pyridOXINE  100 mg Oral Daily  . rosuvastatin  10 mg Oral Daily  . sevelamer carbonate  2,400 mg Oral TID WC  . sodium chloride flush  3 mL Intravenous Q12H   sodium chloride, acetaminophen, albuterol, nitroGLYCERIN, ondansetron **OR** ondansetron (ZOFRAN) IV, sodium chloride flush  Assessment/ Plan:  Jimmy Carson is a 78 y.o. white male 78 y.o. white male with ESRD, hypothyroidism, hypertension, BPH, CAD status post CABG, hyperlipidemia,  right total knee revision arthroplasty May 22, 2018 Dr. Marry Guan  [facility] nephrology/MWF  1.  End-stage renal disease: tolerated hemodialysis treatment yesterday. UF of 3 liters.  Continue MWF schedule.   2.  Anemia of chronic kidney disease:  Continue mircera as outpatient.   3.  Secondary hyperparathyroidism with hyperphosphatemia.  - Continue Renvela with meals.  - Cinacalcet daily  4. Hypertension: blood pressure at goal.  - isosorbide mononitrate.    LOS: 1 Jackelynn Hosie 1/4/202012:21 PM

## 2018-06-27 NOTE — Discharge Summary (Signed)
[facility] [place] NAME: Jimmy Carson    MR#:  [id number]  DATE OF BIRTH:  12-14-1940  DATE OF ADMISSION:  06/25/2018 ADMITTING PHYSICIAN: Gorden Harms, MD  DATE OF DISCHARGE: 06/27/2018 12:17 PM  PRIMARY CARE PHYSICIAN: Dion Body, MD    ADMISSION DIAGNOSIS:  Pleural effusion [J90]  DISCHARGE DIAGNOSIS:  Active Problems:   Dyspnea   CHF (congestive heart failure) ([place])   SECONDARY DIAGNOSIS:   Past Medical History:  Diagnosis Date  . Anemia   . CAD (coronary artery disease)    s/p percutaneous coronary angioplasty, s/p CABG x 6   . ESRD (end stage renal disease) ([place])   . Heart attack ([place])   . History of radiation therapy    for prostate CA  . Hyperlipidemia   . Hyperparathyroidism, secondary renal ([place])   . Hypertension   . Osteoarthritis   . Polycystic kidney disease   . Prostate CA [facility]) 2012   Brachytherapy  . Renal insufficiency    currently on dialysis  . Shingles     HOSPITAL COURSE:   1.  Acute systolic congestive heart failure.  Since the patient does not urinate dialysis is the only way to remove fluid.  Limited with medications secondary to hypotension.  Midodrine started.  Dialysis is the only way to remove fluid.  Last night they remove 3 L of fluid with dialysis.  Patient breathing much better and offers no complaints. 2.  Hypotension.  Started midodrine 10 mg 3 times daily 3.  Left pleural effusion recurrent.  Patient had a thoracentesis with 500 cc removed.  There is a component of loculation here.  He wanted to follow-up with his own pulmonologist at [facility].  He did not want a referral to our cardiothoracic surgeon. 4.  End-stage renal disease requiring dialysis for shortness of breath and heart failure.  They were able to remove 3 L of fluid last night and was reassessed this morning.  Did not require dialysis this morning.  Dialysis will have to remove off more fluid each time.  Hopefully  midodrine will help out and keeping blood pressure high enough in order to remove fluid. 5.  Chronic wound on the right lower extremity which is draining serous fluid.  No signs of infection.  Finishing up his Keflex. 6.  History of COPD  DISCHARGE CONDITIONS:   Satisfactory  CONSULTS OBTAINED:  Treatment Team:  Lavonia Dana, MD  DRUG ALLERGIES:   Allergies  Allergen Reactions  . Orange Fruit [Citrus] Hives  . Penicillins Hives and Other (See Comments)    Has patient had a PCN reaction causing immediate rash, facial/tongue/throat swelling, SOB or lightheadedness with hypotension: Yes Has patient had a PCN reaction causing severe rash involving mucus membranes or skin necrosis: No Has patient had a PCN reaction that required hospitalization No Has patient had a PCN reaction occurring within the last 10 years: No If all of the above answers are "NO", then may proceed with Cephalosporin use.     DISCHARGE MEDICATIONS:   Allergies as of 06/27/2018      Reactions   Orange Fruit [citrus] Hives   Penicillins Hives, Other (See Comments)   Has patient had a PCN reaction causing immediate rash, facial/tongue/throat swelling, SOB or lightheadedness with hypotension: Yes Has patient had a PCN reaction causing severe rash involving mucus membranes or skin necrosis: No Has patient had a PCN reaction that required hospitalization No Has patient had a PCN reaction  occurring within the last 10 years: No If all of the above answers are "NO", then may proceed with Cephalosporin use.      Medication List    TAKE these medications   acetaminophen 500 MG tablet Commonly known as:  TYLENOL Take 1,000 mg by mouth every 6 (six) hours as needed for mild pain or headache.   cephALEXin 500 MG capsule Commonly known as:  KEFLEX Take 500 mg by mouth 2 (two) times daily.   cinacalcet 60 MG tablet Commonly known as:  SENSIPAR Take 60 mg by mouth daily.   clopidogrel 75 MG tablet Commonly  known as:  PLAVIX Take 75 mg by mouth daily.   isosorbide mononitrate 60 MG 24 hr tablet Commonly known as:  IMDUR Take 60 mg by mouth daily.   midodrine 10 MG tablet Commonly known as:  PROAMATINE Take 1 tablet (10 mg total) by mouth 3 (three) times daily with meals.   nitroGLYCERIN 0.4 MG SL tablet Commonly known as:  NITROSTAT Place 1 tablet (0.4 mg total) under the tongue every 5 (five) minutes as needed for chest pain.   pyridOXINE 100 MG tablet Commonly known as:  VITAMIN B-6 Take 100 mg by mouth daily.   rosuvastatin 10 MG tablet Commonly known as:  CRESTOR Take 10 mg by mouth daily.   sevelamer carbonate 800 MG tablet Commonly known as:  RENVELA Take 1,600-2,400 mg by mouth See admin instructions. Take 2400 mg by mouth 3 times daily with meals and take 1600 mg by mouth with snacks        DISCHARGE INSTRUCTIONS:   Follow-up PMD 1 week Follow-up with dialysis as scheduled Follow-up with your pulmonologist  If you experience worsening of your admission symptoms, develop shortness of breath, life threatening emergency, suicidal or homicidal thoughts you must seek medical attention immediately by calling 911 or calling your MD immediately  if symptoms less severe.  You Must read complete instructions/literature along with all the possible adverse reactions/side effects for all the Medicines you take and that have been prescribed to you. Take any new Medicines after you have completely understood and accept all the possible adverse reactions/side effects.   Please note  You were cared for by a hospitalist during your hospital stay. If you have any questions about your discharge medications or the care you received while you were in the hospital after you are discharged, you can call the unit and asked to speak with the hospitalist on call if the hospitalist that took care of you is not available. Once you are discharged, your primary care physician will handle any further  medical issues. Please note that NO REFILLS for any discharge medications will be authorized once you are discharged, as it is imperative that you return to your primary care physician (or establish a relationship with a primary care physician if you do not have one) for your aftercare needs so that they can reassess your need for medications and monitor your lab values.    Today   CHIEF COMPLAINT:   Chief Complaint  Patient presents with  . Shortness of Breath    HISTORY OF PRESENT ILLNESS:  Jimmy Carson  is a 78 y.o. male with a known history of end-stage renal disease and pleural effusion presents with shortness of breath.   VITAL SIGNS:  Blood pressure 97/85, pulse 75, temperature 97.7 F (36.5 C), temperature source Oral, resp. rate 18, height 5\' 10"  (1.778 m), weight 75.6 kg, SpO2 100 %.   PHYSICAL  EXAMINATION:  GENERAL:  78 y.o.-year-old patient lying in the bed with no acute distress.  EYES: Pupils equal, round, reactive to light and accommodation. No scleral icterus. Extraocular muscles intact.  HEENT: Head atraumatic, normocephalic. Oropharynx and nasopharynx clear.  NECK:  Supple, no jugular venous distention. No thyroid enlargement, no tenderness.  LUNGS: Normal breath sounds bilaterally, no wheezing, rales,rhonchi or crepitation. No use of accessory muscles of respiration.  CARDIOVASCULAR: S1, S2 normal. No murmurs, rubs, or gallops.  ABDOMEN: Soft, non-tender, non-distended. Bowel sounds present. No organomegaly or mass.  EXTREMITIES: Trace pedal edema, no cyanosis, or clubbing.  NEUROLOGIC: Cranial nerves II through XII are intact. Muscle strength 5/5 in all extremities. Sensation intact. Gait not checked.  PSYCHIATRIC: The patient is alert and oriented x 3.  SKIN: Right knee slight open wound draining serous fluid.  DATA REVIEW:   CBC Recent Labs  Lab 06/26/18 1055  WBC 6.3  HGB 8.7*  HCT 28.8*  PLT 185    Chemistries  Recent Labs  Lab  06/26/18 1055  NA 138  K 5.2*  CL 95*  CO2 27  GLUCOSE 84  BUN 46*  CREATININE 5.99*  CALCIUM 8.5*    Cardiac Enzymes Recent Labs  Lab 06/26/18 0532  TROPONINI 0.08*    Microbiology Results  Results for orders placed or performed during the hospital encounter of 06/14/18  Aerobic/Anaerobic Culture (surgical/deep wound)     Status: None   Collection Time: 06/14/18  5:57 PM  Result Value Ref Range Status   Specimen Description WOUND  Final   Special Requests RIGHT KNEE JOINT  Final   Gram Stain   Final    RARE WBC PRESENT, PREDOMINANTLY PMN RARE GRAM POSITIVE COCCI IN PAIRS    Culture   Final    FEW NORMAL SKIN FLORA NO ANAEROBES ISOLATED Performed at [facility], [place] [address], [place], [place] [address]    Report Status 06/19/2018 FINAL  Final    RADIOLOGY:  Dg Chest 2 View  Result Date: 06/27/2018 CLINICAL DATA:  Left-sided pleural effusion. Follow-up. EXAM: CHEST - 2 VIEW COMPARISON:  06/26/2018 FINDINGS: Previous median sternotomy and CABG. Previous aortic valve replacement. Right chest remains clear. Persistent left effusion with atelectasis and or infiltrate in the left lower lung. Findings may be slightly worsened on the left. IMPRESSION: Persistent left effusion with atelectasis and or infiltrate in the left lower lung. Findings may be slightly worsened on the left. Electronically Signed   By: Nelson Chimes M.D.   On: 06/27/2018 08:15   Dg Chest 2 View  Result Date: 06/25/2018 CLINICAL DATA:  Shortness of breath EXAM: CHEST - 2 VIEW COMPARISON:  06/11/2018 FINDINGS: Post sternotomy changes with valve prosthesis. Small left-sided pleural effusion, slightly increased compared to prior. Trace right pleural effusion. Cardiomegaly with vascular congestion. Patchy airspace disease at the left base. Aortic atherosclerosis. No pneumothorax. IMPRESSION: 1. Small left-sided pleural effusion, slight increase as compared to radiograph 06/11/2018. Trace right pleural  effusion 2. Cardiomegaly with vascular congestion. Patchy airspace disease at the left lung base. Electronically Signed   By: Donavan Foil M.D.   On: 06/25/2018 18:50   Dg Chest Port 1 View  Result Date: 06/26/2018 CLINICAL DATA:  Status post left-sided thoracentesis EXAM: PORTABLE CHEST 1 VIEW COMPARISON:  June 25, 2018 FINDINGS: No evident pneumothorax. There is a small at least partially loculated pleural effusion on the left. There is patchy consolidation in the left base. The right lung is clear. Heart is mildly enlarged  with pulmonary vascularity normal. 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 There is aortic atherosclerosis. No evident adenopathy. There is postoperative change in the right shoulder with remodeling of the right humeral head, stable. IMPRESSION: No pneumothorax. Partially loculated pleural effusion left base with left base consolidation, likely atelectasis with potential superimposed pneumonia. Right lung clear. Stable cardiac silhouette. There is aortic atherosclerosis. Postoperative change in the right shoulder with remodeling right humeral head. Aortic Atherosclerosis (ICD10-I70.0). Electronically Signed   By: Lowella Grip III M.D.   On: 06/26/2018 10:05   US Thoracentesis Asp Pleural Space W/img Guide  Result Date: 06/26/2018 INDICATION: Shortness of breath and recurrent left pleural effusion. EXAM: ULTRASOUND GUIDED LEFT THORACENTESIS MEDICATIONS: None. COMPLICATIONS: None immediate. PROCEDURE: An ultrasound guided thoracentesis was thoroughly discussed with the patient and questions answered. The benefits, risks, alternatives and complications were also discussed. The patient understands and wishes to proceed with the procedure. Written consent was obtained. Ultrasound was performed to localize and mark an adequate pocket of fluid in the left chest. The area was then prepped and draped in the normal sterile fashion. 1% Lidocaine  was used for local anesthesia. Under ultrasound guidance a 6 Fr Safe-T-Centesis catheter was introduced. Thoracentesis was performed. The catheter was removed and a dressing applied. FINDINGS: A total of approximately 500 mL of amber fluid was removed. Small amount of residual fluid at end of the procedure. No additional fluid was removed due to patient discomfort. IMPRESSION: Successful ultrasound guided left thoracentesis yielding 500 mL of pleural fluid. Electronically Signed   By: Markus Daft M.D.   On: 06/26/2018 10:03     Management plans discussed with the patient, family and they are in agreement.  CODE STATUS:  Code Status History    Date Active Date Inactive Code Status Order ID Comments User Context   06/25/2018 2324 06/27/2018 1522 Full Code [id number]  Gorden Harms, MD Inpatient   06/04/2018 0013 06/06/2018 1457 Full Code [id number]  Lance Coon, MD Inpatient   [id number] 1526 05/26/2018 2139 Full Code [id number]  Dereck Leep, MD Inpatient   08/30/2017 0207 08/31/2017 1417 Full Code [id number]  Lance Coon, MD Inpatient   05/31/2016 2036 06/04/2016 0053 Full Code [id number]  Henreitta Leber, MD ED   07/22/2015 0002 07/24/2015 2225 Full Code [id number]  Harrie Foreman, MD ED    Advance Directive Documentation     Most Recent Value  Type of Advance Directive  Living will  Pre-existing out of facility DNR order (yellow form or pink MOST form)  -  "MOST" Form in Place?  -      TOTAL TIME TAKING CARE OF THIS PATIENT: 35 minutes.    Loletha Grayer M.D on 06/27/2018 at 3:31 PM  Between 7am to 6pm - Pager - [phone]  After 6pm go to [email or web address] - password [email or web address] [facility]  [phone]  CC: Primary care physician; Dion Body, MD

## 2018-07-02 ENCOUNTER — Telehealth: Payer: Self-pay | Admitting: Pediatrics

## 2018-07-02 NOTE — Telephone Encounter (Signed)
Attempted to call patient.  No answer. Message left for patient to call back

## 2018-07-03 ENCOUNTER — Other Ambulatory Visit: Payer: Self-pay | Admitting: Pulmonary Disease

## 2018-07-03 DIAGNOSIS — J9601 Acute respiratory failure with hypoxia: Secondary | ICD-10-CM

## 2018-07-05 LAB — ACID FAST CULTURE WITH REFLEXED SENSITIVITIES (MYCOBACTERIA): Acid Fast Culture: NEGATIVE

## 2018-07-06 ENCOUNTER — Other Ambulatory Visit: Payer: Self-pay | Admitting: Radiology

## 2018-07-07 ENCOUNTER — Ambulatory Visit: Admission: RE | Admit: 2018-07-07 | Payer: Medicare Other | Source: Ambulatory Visit

## 2018-07-12 ENCOUNTER — Other Ambulatory Visit: Payer: Self-pay

## 2018-07-12 ENCOUNTER — Emergency Department
Admission: EM | Admit: 2018-07-12 | Discharge: 2018-07-12 | Disposition: A | Discharge status: Home / Self Care | Payer: Medicare Other | Source: Home / Self Care | Attending: Emergency Medicine | Admitting: Emergency Medicine

## 2018-07-12 ENCOUNTER — Emergency Department: Payer: Medicare Other

## 2018-07-12 DIAGNOSIS — I252 Old myocardial infarction: Secondary | ICD-10-CM | POA: Insufficient documentation

## 2018-07-12 DIAGNOSIS — Y998 Other external cause status: Secondary | ICD-10-CM

## 2018-07-12 DIAGNOSIS — I2510 Atherosclerotic heart disease of native coronary artery without angina pectoris: Secondary | ICD-10-CM

## 2018-07-12 DIAGNOSIS — Z7902 Long term (current) use of antithrombotics/antiplatelets: Secondary | ICD-10-CM

## 2018-07-12 DIAGNOSIS — W19XXXA Unspecified fall, initial encounter: Secondary | ICD-10-CM

## 2018-07-12 DIAGNOSIS — Z87891 Personal history of nicotine dependence: Secondary | ICD-10-CM | POA: Insufficient documentation

## 2018-07-12 DIAGNOSIS — Z96651 Presence of right artificial knee joint: Secondary | ICD-10-CM

## 2018-07-12 DIAGNOSIS — Y929 Unspecified place or not applicable: Secondary | ICD-10-CM | POA: Insufficient documentation

## 2018-07-12 DIAGNOSIS — I509 Heart failure, unspecified: Secondary | ICD-10-CM

## 2018-07-12 DIAGNOSIS — Z96652 Presence of left artificial knee joint: Secondary | ICD-10-CM | POA: Insufficient documentation

## 2018-07-12 DIAGNOSIS — S81001A Unspecified open wound, right knee, initial encounter: Secondary | ICD-10-CM

## 2018-07-12 DIAGNOSIS — I132 Hypertensive heart and chronic kidney disease with heart failure and with stage 5 chronic kidney disease, or end stage renal disease: Secondary | ICD-10-CM | POA: Diagnosis not present

## 2018-07-12 DIAGNOSIS — Z8546 Personal history of malignant neoplasm of prostate: Secondary | ICD-10-CM | POA: Insufficient documentation

## 2018-07-12 DIAGNOSIS — N186 End stage renal disease: Secondary | ICD-10-CM

## 2018-07-12 DIAGNOSIS — Z951 Presence of aortocoronary bypass graft: Secondary | ICD-10-CM | POA: Insufficient documentation

## 2018-07-12 DIAGNOSIS — Y939 Activity, unspecified: Secondary | ICD-10-CM | POA: Insufficient documentation

## 2018-07-12 DIAGNOSIS — Z9889 Other specified postprocedural states: Secondary | ICD-10-CM | POA: Insufficient documentation

## 2018-07-12 DIAGNOSIS — M25561 Pain in right knee: Secondary | ICD-10-CM

## 2018-07-12 DIAGNOSIS — Z79899 Other long term (current) drug therapy: Secondary | ICD-10-CM

## 2018-07-12 DIAGNOSIS — S86911A Strain of unspecified muscle(s) and tendon(s) at lower leg level, right leg, initial encounter: Secondary | ICD-10-CM

## 2018-07-12 DIAGNOSIS — Z992 Dependence on renal dialysis: Secondary | ICD-10-CM | POA: Insufficient documentation

## 2018-07-12 DIAGNOSIS — M25461 Effusion, right knee: Secondary | ICD-10-CM

## 2018-07-12 DIAGNOSIS — Z923 Personal history of irradiation: Secondary | ICD-10-CM

## 2018-07-12 DIAGNOSIS — R570 Cardiogenic shock: Secondary | ICD-10-CM | POA: Diagnosis not present

## 2018-07-12 DIAGNOSIS — Z952 Presence of prosthetic heart valve: Secondary | ICD-10-CM | POA: Insufficient documentation

## 2018-07-12 NOTE — ED Notes (Signed)
Pt has knee immobilizer and walker at home, will apply immobilizer at home.

## 2018-07-12 NOTE — Discharge Instructions (Addendum)
Your x-ray shows a moderate amount of swelling but no complications with your knee replacement.  Please call Dr. Clydell Hakim office tomorrow to see in about a follow-up appointment with him on Monday or Tuesday before surgery.  Please wear knee immobilizer and use walker until seen by Dr. Marry Guan.

## 2018-07-12 NOTE — ED Notes (Signed)
XR in room 

## 2018-07-12 NOTE — ED Provider Notes (Signed)
[facility] Emergency Department Provider Note  ____________________________________________  Time seen: Approximately 7:58 AM  I have reviewed the triage vital signs and the nursing notes.   HISTORY  Chief Complaint Knee Pain    HPI Jimmy Carson is a 78 y.o. male that presents emergency department for evaluation of worsening right knee pain for 1 week.  Patient states that he lost his balance last week and fell.  He landed on his left knee but twisted his right knee.  Knee was not excruciatingly painful at first.  He continued to walk on leg with his cane and pain and swelling continued to increase.  Patient had knee surgery on 1129 with Dr. Marry Guan.  He has a nonhealing wound to the front of his knee that he states looks better than it did previously.  He saw Dr. Marry Guan last week but forgot to mention his fall.  Patient is scheduled to have surgery with Dr. Marry Guan in 3 days for nonhealing wound.  He is scheduled to go in on Tuesday for preop.  After surgery in November, he was initially using his walker but felt he was doing well enough to start using his cane.  Daughter states that he should have been using his walker the entire time.  He has been taking Tylenol for pain.  No fever.   Past Medical History:  Diagnosis Date  . Anemia   . CAD (coronary artery disease)    s/p percutaneous coronary angioplasty, s/p CABG x 6   . ESRD (end stage renal disease) ([place])   . Heart attack ([place])   . History of radiation therapy    for prostate CA  . Hyperlipidemia   . Hyperparathyroidism, secondary renal ([place])   . Hypertension   . Osteoarthritis   . Polycystic kidney disease   . Prostate CA [facility]) 2012   Brachytherapy  . Renal insufficiency    currently on dialysis  . Shingles     Patient Active Problem List   Diagnosis Date Noted  . CHF (congestive heart failure) ([place]) 06/26/2018  . Dyspnea 06/25/2018  . History of revision of total knee arthroplasty  104-19-2019  . CAD S/P percutaneous coronary angioplasty 05/15/2018  . ESRD on hemodialysis ([place]) 05/15/2018  . Insomnia 05/15/2018  . Pure hypercholesterolemia 05/15/2018  . Pleural effusion on left 08/30/2017  . HTN (hypertension) 08/30/2017  . CAD (coronary artery disease) 08/30/2017  . HLD (hyperlipidemia) 08/30/2017  . 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 Pulmonary emphysema ([place]) 07/18/2016  . Other specified complication of vascular prosthetic devices, implants and grafts, initial encounter ([place]) 05/08/2016  . Complete tear of left rotator cuff 01/12/2016  . Rotator cuff arthropathy, left 01/12/2016  . Rotator cuff arthropathy, right 01/12/2016  . NSTEMI (non-ST elevated myocardial infarction) ([place]) 07/22/2015  . Chest pain 07/21/2015  . Non-rheumatic mitral regurgitation 07/18/2015  . History of prostate cancer 12/02/2014  . Lymphopenia 12/02/2014  . 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 S/P CABG x 6 02/16/2014  . Discitis 02/11/2013  . Anemia of chronic disease 12/04/2012  . Hyperparathyroidism, secondary renal ([place]) 12/04/2012  . Congenital polycystic kidney 12/03/2012  . PKD (polycystic kidney disease) 12/03/2012    Past Surgical History:  Procedure Laterality Date  . AORTIC VALVE REPLACEMENT    . CARDIAC CATHETERIZATION N/A 07/24/2015   Procedure: Coronary/Graft Angiography;  Surgeon: Corey Skains, MD;  Location: [facility];  Service: Cardiovascular;  Laterality:  N/A;  . CARDIAC CATHETERIZATION N/A 06/03/2016   Procedure: Left Heart Cath and Coronary Angiography;  Surgeon: Isaias Cowman, MD;  Location: [place] [facility];  Service: Cardiovascular;  Laterality: N/A;  . CORONARY ARTERY BYPASS GRAFT    . HERNIA REPAIR    . JOINT REPLACEMENT    . REPLACEMENT TOTAL KNEE BILATERAL    . ROTATOR CUFF REPAIR Right   . TOTAL KNEE REVISION Right 111/10/2017   Procedure: TOTAL  KNEE REVISION;  Surgeon: Dereck Leep, MD;  Location: [facility] ORS;  Service: Orthopedics;  Laterality: Right;    Prior to Admission medications   Medication Sig Start Date End Date Taking? Authorizing Provider  acetaminophen (TYLENOL) 500 MG tablet Take 1,000 mg by mouth every 6 (six) hours as needed for mild pain or headache.     [provider]  cephALEXin (KEFLEX) 500 MG capsule Take 500 mg by mouth 2 (two) times daily.    [provider]  cinacalcet (SENSIPAR) 60 MG tablet Take 60 mg by mouth daily.     [provider]  clopidogrel (PLAVIX) 75 MG tablet Take 75 mg by mouth daily.    [provider]  isosorbide mononitrate (IMDUR) 60 MG 24 hr tablet Take 60 mg by mouth daily.    [provider]  midodrine (PROAMATINE) 10 MG tablet Take 1 tablet (10 mg total) by mouth 3 (three) times daily with meals. 06/27/18   Loletha Grayer, MD  nitroGLYCERIN (NITROSTAT) 0.4 MG SL tablet Place 1 tablet (0.4 mg total) under the tongue every 5 (five) minutes as needed for chest pain. 06/03/16   Max Sane, MD  pyridOXINE (VITAMIN B-6) 100 MG tablet Take 100 mg by mouth daily.    [provider]  rosuvastatin (CRESTOR) 10 MG tablet Take 10 mg by mouth daily.    [provider]  sevelamer carbonate (RENVELA) 800 MG tablet Take 1,600-2,400 mg by mouth See admin instructions. Take 2400 mg by mouth 3 times daily with meals and take 1600 mg by mouth with snacks    [provider]    Allergies Orange fruit [citrus] and Penicillins  Family History  Problem Relation Age of Onset  . Polycystic kidney disease Father   . Aneurysm Father     Social History Social History   Tobacco Use  . Smoking status: Former Smoker    Last attempt to quit: 04/19/2013    Years since quitting: 5.2  . Smokeless tobacco: Never Used  Substance Use Topics  . Alcohol use: No  . Drug use: No     Review of Systems  Constitutional: No  fever/chills Gastrointestinal:No nausea, no vomiting.  Musculoskeletal: Positive for knee pain.  Skin: Negative for abrasions, lacerations, ecchymosis. Neurological: Negative for  numbness or tingling   ____________________________________________   PHYSICAL EXAM:  VITAL SIGNS: ED Triage Vitals  Enc Vitals Group     BP 07/12/18 0703 (!) 107/49     Pulse Rate 07/12/18 0703 68     Resp 07/12/18 0703 18     Temp 07/12/18 0703 (!) 97.5 F (36.4 C)     Temp Source 07/12/18 0703 Oral     SpO2 07/12/18 0703 96 %     Weight --      Height --      Head Circumference --      Peak Flow --      Pain Score 07/12/18 0701 0     Pain Loc --      Pain  Edu? --      Excl. in [place]? --      Constitutional: Alert and oriented. Well appearing and in no acute distress. Eyes: Conjunctivae are normal. PERRL. EOMI. Head: Atraumatic. ENT:      Ears:      Nose: No congestion/rhinnorhea.      Mouth/Throat: Mucous membranes are moist.  Neck: No stridor.  Cardiovascular: Normal rate, regular rhythm.  Good peripheral circulation. Respiratory: Normal respiratory effort without tachypnea or retractions. Lungs CTAB. Good air entry to the bases with no decreased or absent breath sounds. Musculoskeletal: Full range of motion to all extremities. No gross deformities appreciated.  No tenderness to palpation.  Moderate swelling to right knee.  No overlying erythema.  Large surgical scar to anterior knee with 1 cm nonhealing center with minimal erythema surrounding wound and yellow serous drainage on bandage. Negative anterior drawer, posterior drawer, valgus, varus, mcMurray, patella apprehension, apley grind. Neurologic:  Normal speech and language. No gross focal neurologic deficits are appreciated.  Skin:  Skin is warm, dry and intact. No rash noted. Psychiatric: Mood and affect are normal. Speech and behavior are normal. Patient exhibits appropriate insight and  judgement.   ____________________________________________   LABS (all labs ordered are listed, but only abnormal results are displayed)  Labs Reviewed - No data to display ____________________________________________  EKG   ____________________________________________  RADIOLOGY Robinette Haines, personally viewed and evaluated these images (plain radiographs) as part of my medical decision making, as well as reviewing the written report by the radiologist.  Dg Knee Complete 4 Views Right  Result Date: 07/12/2018 CLINICAL DATA:  Pain following fall EXAM: RIGHT KNEE - COMPLETE 4+ VIEW COMPARISON:  May 22, 2018 FINDINGS: Frontal, lateral, and bilateral oblique views were obtained. 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 No acute fracture or dislocation. There is a moderate joint effusion. There is extensive soft tissue swelling anteriorly. There is extensive arterial vascular calcification. IMPRESSION: Soft tissue swelling anteriorly with moderate joint effusion. No fracture or dislocation evident. Total knee replacement prosthetic components appear well seated. There is multifocal arterial vascular calcification/atherosclerosis. Electronically Signed   By: Lowella Grip III M.D.   On: 07/12/2018 07:41    ____________________________________________    PROCEDURES  Procedure(s) performed:    Procedures    Medications - No data to display   ____________________________________________   INITIAL IMPRESSION / ASSESSMENT AND PLAN / ED COURSE  Pertinent labs & imaging results that were available during my care of the patient were reviewed by me and considered in my medical decision making (see chart for details).  Review of the [place] CSRS was performed in accordance of the [place] prior to dispensing any controlled drugs.   Patient presented to emergency department for evaluation of knee injury last week.  Vital signs and  exam are reassuring.  Patient is scheduled to see Dr. Marry Guan this week for surgery for nonhealing wound post total knee replacement. He follows up on Tuesday for preop labs and on Wednesday for surgery. He will discuss with Dr. Marry Guan on Tuesday. Patient and daughter state that wound appears better than it has.  No indication of septic joint.  Patient has a knee immobilizer at home and walker at home that he will begin to use.  Patient is to follow up with orthopedics as directed. Patient is given ED precautions to return to the ED for any worsening or new symptoms.     ____________________________________________  FINAL CLINICAL IMPRESSION(S) / ED  DIAGNOSES  Final diagnoses:  Acute pain of right knee  Effusion of right knee  Strain of right knee, initial encounter  History of knee surgery  Open wound of right knee, initial encounter      NEW MEDICATIONS STARTED DURING THIS VISIT:  ED Discharge Orders    None          This chart was dictated using voice recognition software/Dragon. Despite best efforts to proofread, errors can occur which can change the meaning. Any change was purely unintentional.    Laban Emperor, PA-C 07/12/18 1334    Delman Kitten, MD 07/12/18 1616

## 2018-07-12 NOTE — ED Triage Notes (Addendum)
Patient reports had surgery on right knee 8 weeks ago.  Reports fell a week ago (tripped over his feet), did not land on right knee but feels like he might have twisted that knee.  States now unable to bear weight on right leg.

## 2018-07-12 NOTE — ED Notes (Addendum)
Pt had surgery to right knee for revision on 11/29. Pt reports twisting right knee during a fall last week, pt states that he is able to bear some weight, but is painful, pt sees Dr. Marry Guan and has appt for I & D of knee on Wednesday. Per pt has has not informed the office that his knee is painful after twisting it during the fall.  Pt here with family.

## 2018-07-12 NOTE — ED Notes (Signed)
XR out of room at this time.

## 2018-07-14 ENCOUNTER — Other Ambulatory Visit: Payer: Self-pay

## 2018-07-14 ENCOUNTER — Encounter
Admission: RE | Admit: 2018-07-14 | Discharge: 2018-07-14 | Disposition: A | Discharge status: Home / Self Care | Payer: Medicare Other | Source: Ambulatory Visit | Attending: Orthopedic Surgery | Admitting: Orthopedic Surgery

## 2018-07-14 DIAGNOSIS — Z01812 Encounter for preprocedural laboratory examination: Secondary | ICD-10-CM | POA: Insufficient documentation

## 2018-07-14 LAB — SEDIMENTATION RATE: Sed Rate: 67 mm/hr — ABNORMAL HIGH (ref 0–20)

## 2018-07-14 LAB — BASIC METABOLIC PANEL
Anion gap: 11 (ref 5–15)
BUN: 30 mg/dL — ABNORMAL HIGH (ref 8–23)
CHLORIDE: 92 mmol/L — AB (ref 98–111)
CO2: 34 mmol/L — AB (ref 22–32)
Calcium: 7.7 mg/dL — ABNORMAL LOW (ref 8.9–10.3)
Creatinine, Ser: 4.54 mg/dL — ABNORMAL HIGH (ref 0.61–1.24)
GFR calc Af Amer: 14 mL/min — ABNORMAL LOW (ref >60)
GFR calc non Af Amer: 12 mL/min — ABNORMAL LOW (ref >60)
Glucose, Bld: 86 mg/dL (ref 70–99)
Potassium: 3.9 mmol/L (ref 3.5–5.1)
Sodium: 137 mmol/L (ref 135–145)

## 2018-07-14 LAB — C-REACTIVE PROTEIN: CRP: 13.9 mg/dL — ABNORMAL HIGH (ref <1.0)

## 2018-07-14 LAB — CBC
HCT: 28.3 % — ABNORMAL LOW (ref 39.0–52.0)
Hemoglobin: 8.3 g/dL — ABNORMAL LOW (ref 13.0–17.0)
MCH: 27.8 pg (ref 26.0–34.0)
MCHC: 29.3 g/dL — ABNORMAL LOW (ref 30.0–36.0)
MCV: 94.6 fL (ref 80.0–100.0)
Platelets: 143 10*3/uL — ABNORMAL LOW (ref 150–400)
RBC: 2.99 MIL/uL — AB (ref 4.22–5.81)
RDW: 19.8 % — ABNORMAL HIGH (ref 11.5–15.5)
WBC: 5.4 10*3/uL (ref 4.0–10.5)
nRBC: 0 % (ref 0.0–0.2)

## 2018-07-14 LAB — PROTIME-INR
INR: 1.21
Prothrombin Time: 15.2 seconds (ref 11.4–15.2)

## 2018-07-14 LAB — SURGICAL PCR SCREEN
MRSA, PCR: NEGATIVE
Staphylococcus aureus: NEGATIVE

## 2018-07-14 MED ORDER — CLINDAMYCIN PHOSPHATE 900 MG/50ML IV SOLN
900.0000 mg | INTRAVENOUS | Status: AC
  Administered 2018-07-15: 900 mg via INTRAVENOUS

## 2018-07-14 NOTE — Patient Instructions (Addendum)
Your procedure is scheduled on: Wednesday 07/21/2018.  Report to DAY SURGERY DEPARTMENT LOCATED ON 2ND FLOOR MEDICAL MALL ENTRANCE. To find out your arrival time please call [phone] between [place].   Remember: Instructions that are not followed completely may result in serious medical risk, up to and including death, or upon the discretion of your surgeon and anesthesiologist your surgery may need to be rescheduled.      _X__ 1. Do not eat food after midnight the night before your procedure.                 No gum chewing or hard candies. You may drink clear liquids up to 2 hours                 before you are scheduled to arrive for your surgery- DO NOT drink clear                 liquids within 2 hours of the start of your surgery.                 Clear Liquids include:  water, apple juice without pulp, clear carbohydrate                 drink such as Clearfast or Gatorade, Black Coffee or Tea (Do not add                 milk or creamer to coffee or tea).  __X__2.  On the morning of surgery brush your teeth with toothpaste and water, you may rinse your mouth with mouthwash if you wish.  Do not swallow any toothpaste or mouthwash.     __X__6.  Notify your doctor if there is any change in your medical condition      (cold, fever, infections).     Do not wear jewelry, make-up, hairpins, clips or nail polish. Do not wear lotions, powders, or perfumes.  Do not shave 48 hours prior to surgery. Men may shave face and neck. Do not bring valuables to the hospital.    [facility] is not responsible for any belongings or valuables.  Contacts, dentures/partials or body piercings may not be worn into surgery. Bring a case for your contacts, glasses or hearing aids, a denture cup will be supplied.    Patients discharged the day of surgery will not be allowed to drive home.   Please read over the following fact sheets that you were given:   MRSA Information  __X__ Take these  medicines the morning of surgery with A SIP OF WATER:     1. acetaminophen (TYLENOL) 500 MG tablet (If needed)  2. midodrine (PROAMATINE) 10 MG tablet     __X__ Use SAGE wipes as directed   __X__ Stop Blood Thinners Plavix. Your Plavix was stopped on 07/07/18.  __X__ Stop Anti-inflammatories 7 days before surgery such as Advil, Ibuprofen, Motrin, BC or Goodies Powder, Naprosyn, Naproxen, Aleve, Aspirin, Meloxicam. May take Tylenol if needed for pain or discomfort.   __X__ Do not begin any new herbal supplements prior to your surgery.

## 2018-07-14 NOTE — Pre-Procedure Instructions (Signed)
Faxed abnormal lab results to Dr. Marry Guan. Fax confirmation received.

## 2018-07-15 ENCOUNTER — Telehealth: Payer: Self-pay | Admitting: *Deleted

## 2018-07-15 ENCOUNTER — Other Ambulatory Visit: Payer: Self-pay

## 2018-07-15 ENCOUNTER — Encounter: Admission: RE | Disposition: E | Payer: Self-pay | Source: Home / Self Care | Attending: Orthopedic Surgery

## 2018-07-15 ENCOUNTER — Ambulatory Visit: Payer: Medicare Other | Admitting: Anesthesiology

## 2018-07-15 ENCOUNTER — Encounter: Payer: Self-pay | Admitting: Orthopedic Surgery

## 2018-07-15 ENCOUNTER — Inpatient Hospital Stay: Payer: Medicare Other

## 2018-07-15 ENCOUNTER — Inpatient Hospital Stay
Admission: RE | Admit: 2018-07-15 | Discharge: 2018-07-25 | DRG: 981 | Disposition: E | Payer: Medicare Other | Attending: Internal Medicine | Admitting: Internal Medicine

## 2018-07-15 DIAGNOSIS — Z978 Presence of other specified devices: Secondary | ICD-10-CM

## 2018-07-15 DIAGNOSIS — Z9911 Dependence on respirator [ventilator] status: Secondary | ICD-10-CM | POA: Diagnosis not present

## 2018-07-15 DIAGNOSIS — D631 Anemia in chronic kidney disease: Secondary | ICD-10-CM | POA: Diagnosis present

## 2018-07-15 DIAGNOSIS — R6520 Severe sepsis without septic shock: Secondary | ICD-10-CM | POA: Diagnosis not present

## 2018-07-15 DIAGNOSIS — Z992 Dependence on renal dialysis: Secondary | ICD-10-CM

## 2018-07-15 DIAGNOSIS — Z823 Family history of stroke: Secondary | ICD-10-CM

## 2018-07-15 DIAGNOSIS — J9601 Acute respiratory failure with hypoxia: Secondary | ICD-10-CM | POA: Diagnosis present

## 2018-07-15 DIAGNOSIS — J439 Emphysema, unspecified: Secondary | ICD-10-CM | POA: Diagnosis present

## 2018-07-15 DIAGNOSIS — E440 Moderate protein-calorie malnutrition: Secondary | ICD-10-CM

## 2018-07-15 DIAGNOSIS — Z8546 Personal history of malignant neoplasm of prostate: Secondary | ICD-10-CM

## 2018-07-15 DIAGNOSIS — Z955 Presence of coronary angioplasty implant and graft: Secondary | ICD-10-CM

## 2018-07-15 DIAGNOSIS — R001 Bradycardia, unspecified: Secondary | ICD-10-CM | POA: Diagnosis present

## 2018-07-15 DIAGNOSIS — J9690 Respiratory failure, unspecified, unspecified whether with hypoxia or hypercapnia: Secondary | ICD-10-CM | POA: Diagnosis present

## 2018-07-15 DIAGNOSIS — Z951 Presence of aortocoronary bypass graft: Secondary | ICD-10-CM | POA: Diagnosis not present

## 2018-07-15 DIAGNOSIS — Z515 Encounter for palliative care: Secondary | ICD-10-CM | POA: Diagnosis not present

## 2018-07-15 DIAGNOSIS — Z0189 Encounter for other specified special examinations: Secondary | ICD-10-CM

## 2018-07-15 DIAGNOSIS — I452 Bifascicular block: Secondary | ICD-10-CM | POA: Diagnosis present

## 2018-07-15 DIAGNOSIS — R451 Restlessness and agitation: Secondary | ICD-10-CM

## 2018-07-15 DIAGNOSIS — R570 Cardiogenic shock: Secondary | ICD-10-CM | POA: Diagnosis present

## 2018-07-15 DIAGNOSIS — I440 Atrioventricular block, first degree: Secondary | ICD-10-CM | POA: Diagnosis present

## 2018-07-15 DIAGNOSIS — E039 Hypothyroidism, unspecified: Secondary | ICD-10-CM | POA: Diagnosis present

## 2018-07-15 DIAGNOSIS — Z66 Do not resuscitate: Secondary | ICD-10-CM | POA: Diagnosis not present

## 2018-07-15 DIAGNOSIS — R600 Localized edema: Secondary | ICD-10-CM

## 2018-07-15 DIAGNOSIS — I132 Hypertensive heart and chronic kidney disease with heart failure and with stage 5 chronic kidney disease, or end stage renal disease: Principal | ICD-10-CM | POA: Diagnosis present

## 2018-07-15 DIAGNOSIS — R739 Hyperglycemia, unspecified: Secondary | ICD-10-CM | POA: Diagnosis present

## 2018-07-15 DIAGNOSIS — I255 Ischemic cardiomyopathy: Secondary | ICD-10-CM | POA: Diagnosis present

## 2018-07-15 DIAGNOSIS — E875 Hyperkalemia: Secondary | ICD-10-CM | POA: Diagnosis not present

## 2018-07-15 DIAGNOSIS — Y838 Other surgical procedures as the cause of abnormal reaction of the patient, or of later complication, without mention of misadventure at the time of the procedure: Secondary | ICD-10-CM | POA: Diagnosis present

## 2018-07-15 DIAGNOSIS — T8453XA Infection and inflammatory reaction due to internal right knee prosthesis, initial encounter: Secondary | ICD-10-CM | POA: Diagnosis present

## 2018-07-15 DIAGNOSIS — R579 Shock, unspecified: Secondary | ICD-10-CM | POA: Diagnosis not present

## 2018-07-15 DIAGNOSIS — Z952 Presence of prosthetic heart valve: Secondary | ICD-10-CM

## 2018-07-15 DIAGNOSIS — I420 Dilated cardiomyopathy: Secondary | ICD-10-CM | POA: Diagnosis present

## 2018-07-15 DIAGNOSIS — I471 Supraventricular tachycardia: Secondary | ICD-10-CM | POA: Diagnosis not present

## 2018-07-15 DIAGNOSIS — R0609 Other forms of dyspnea: Secondary | ICD-10-CM | POA: Diagnosis not present

## 2018-07-15 DIAGNOSIS — T380X5A Adverse effect of glucocorticoids and synthetic analogues, initial encounter: Secondary | ICD-10-CM | POA: Diagnosis present

## 2018-07-15 DIAGNOSIS — R627 Adult failure to thrive: Secondary | ICD-10-CM | POA: Diagnosis not present

## 2018-07-15 DIAGNOSIS — G9341 Metabolic encephalopathy: Secondary | ICD-10-CM | POA: Diagnosis present

## 2018-07-15 DIAGNOSIS — I2510 Atherosclerotic heart disease of native coronary artery without angina pectoris: Secondary | ICD-10-CM | POA: Diagnosis present

## 2018-07-15 DIAGNOSIS — M00861 Arthritis due to other bacteria, right knee: Secondary | ICD-10-CM | POA: Diagnosis present

## 2018-07-15 DIAGNOSIS — I9581 Postprocedural hypotension: Secondary | ICD-10-CM

## 2018-07-15 DIAGNOSIS — E872 Acidosis: Secondary | ICD-10-CM | POA: Diagnosis present

## 2018-07-15 DIAGNOSIS — I5023 Acute on chronic systolic (congestive) heart failure: Secondary | ICD-10-CM | POA: Diagnosis present

## 2018-07-15 DIAGNOSIS — Z87891 Personal history of nicotine dependence: Secondary | ICD-10-CM

## 2018-07-15 DIAGNOSIS — J9611 Chronic respiratory failure with hypoxia: Secondary | ICD-10-CM | POA: Diagnosis not present

## 2018-07-15 DIAGNOSIS — J9602 Acute respiratory failure with hypercapnia: Secondary | ICD-10-CM | POA: Diagnosis present

## 2018-07-15 DIAGNOSIS — Z7902 Long term (current) use of antithrombotics/antiplatelets: Secondary | ICD-10-CM

## 2018-07-15 DIAGNOSIS — N186 End stage renal disease: Secondary | ICD-10-CM | POA: Diagnosis present

## 2018-07-15 DIAGNOSIS — N2581 Secondary hyperparathyroidism of renal origin: Secondary | ICD-10-CM | POA: Diagnosis present

## 2018-07-15 DIAGNOSIS — I252 Old myocardial infarction: Secondary | ICD-10-CM

## 2018-07-15 DIAGNOSIS — A419 Sepsis, unspecified organism: Secondary | ICD-10-CM | POA: Diagnosis not present

## 2018-07-15 DIAGNOSIS — Z96653 Presence of artificial knee joint, bilateral: Secondary | ICD-10-CM | POA: Diagnosis present

## 2018-07-15 DIAGNOSIS — Z6825 Body mass index (BMI) 25.0-25.9, adult: Secondary | ICD-10-CM

## 2018-07-15 DIAGNOSIS — E782 Mixed hyperlipidemia: Secondary | ICD-10-CM | POA: Diagnosis present

## 2018-07-15 DIAGNOSIS — T8131XA Disruption of external operation (surgical) wound, not elsewhere classified, initial encounter: Secondary | ICD-10-CM | POA: Diagnosis present

## 2018-07-15 DIAGNOSIS — Z8249 Family history of ischemic heart disease and other diseases of the circulatory system: Secondary | ICD-10-CM

## 2018-07-15 DIAGNOSIS — N400 Enlarged prostate without lower urinary tract symptoms: Secondary | ICD-10-CM | POA: Diagnosis present

## 2018-07-15 DIAGNOSIS — B965 Pseudomonas (aeruginosa) (mallei) (pseudomallei) as the cause of diseases classified elsewhere: Secondary | ICD-10-CM | POA: Diagnosis present

## 2018-07-15 HISTORY — PX: I & D EXTREMITY: SHX5045

## 2018-07-15 LAB — POCT I-STAT 4, (NA,K, GLUC, HGB,HCT)
Glucose, Bld: 82 mg/dL (ref 70–99)
HCT: 30 % — ABNORMAL LOW (ref 39.0–52.0)
Hemoglobin: 10.2 g/dL — ABNORMAL LOW (ref 13.0–17.0)
Potassium: 4.8 mmol/L (ref 3.5–5.1)
Sodium: 134 mmol/L — ABNORMAL LOW (ref 135–145)

## 2018-07-15 LAB — CBC WITH DIFFERENTIAL/PLATELET
Abs Immature Granulocytes: 0.06 10*3/uL (ref 0.00–0.07)
Basophils Absolute: 0.1 10*3/uL (ref 0.0–0.1)
Basophils Relative: 1 %
Eosinophils Absolute: 0.1 10*3/uL (ref 0.0–0.5)
Eosinophils Relative: 2 %
HCT: 35.3 % — ABNORMAL LOW (ref 39.0–52.0)
HEMOGLOBIN: 9.9 g/dL — AB (ref 13.0–17.0)
Immature Granulocytes: 1 %
LYMPHS ABS: 1.3 10*3/uL (ref 0.7–4.0)
Lymphocytes Relative: 23 %
MCH: 27.8 pg (ref 26.0–34.0)
MCHC: 28 g/dL — ABNORMAL LOW (ref 30.0–36.0)
MCV: 99.2 fL (ref 80.0–100.0)
Monocytes Absolute: 0.5 10*3/uL (ref 0.1–1.0)
Monocytes Relative: 8 %
NRBC: 0 % (ref 0.0–0.2)
Neutro Abs: 3.7 10*3/uL (ref 1.7–7.7)
Neutrophils Relative %: 65 %
Platelets: 161 10*3/uL (ref 150–400)
RBC: 3.56 MIL/uL — ABNORMAL LOW (ref 4.22–5.81)
RDW: 19.7 % — ABNORMAL HIGH (ref 11.5–15.5)
WBC: 5.8 10*3/uL (ref 4.0–10.5)

## 2018-07-15 LAB — COMPREHENSIVE METABOLIC PANEL
ALT: 19 U/L (ref 0–44)
AST: 43 U/L — ABNORMAL HIGH (ref 15–41)
Albumin: 2.7 g/dL — ABNORMAL LOW (ref 3.5–5.0)
Alkaline Phosphatase: 89 U/L (ref 38–126)
Anion gap: 18 — ABNORMAL HIGH (ref 5–15)
BUN: 40 mg/dL — ABNORMAL HIGH (ref 8–23)
CHLORIDE: 95 mmol/L — AB (ref 98–111)
CO2: 23 mmol/L (ref 22–32)
Calcium: 7.5 mg/dL — ABNORMAL LOW (ref 8.9–10.3)
Creatinine, Ser: 5.94 mg/dL — ABNORMAL HIGH (ref 0.61–1.24)
GFR calc Af Amer: 10 mL/min — ABNORMAL LOW (ref >60)
GFR calc non Af Amer: 8 mL/min — ABNORMAL LOW (ref >60)
Glucose, Bld: 106 mg/dL — ABNORMAL HIGH (ref 70–99)
POTASSIUM: 4.4 mmol/L (ref 3.5–5.1)
Sodium: 136 mmol/L (ref 135–145)
Total Bilirubin: 0.9 mg/dL (ref 0.3–1.2)
Total Protein: 5.8 g/dL — ABNORMAL LOW (ref 6.5–8.1)

## 2018-07-15 LAB — BLOOD GAS, ARTERIAL
Acid-base deficit: 5.3 mmol/L — ABNORMAL HIGH (ref 0.0–2.0)
Bicarbonate: 21.2 mmol/L (ref 20.0–28.0)
FIO2: 1
MECHVT: 500 mL
O2 Saturation: 99.9 %
PEEP: 8 cmH2O
PH ART: 7.29 — AB (ref 7.350–7.450)
Patient temperature: 37
RATE: 18 resp/min
pCO2 arterial: 44 mmHg (ref 32.0–48.0)
pO2, Arterial: 302 mmHg — ABNORMAL HIGH (ref 83.0–108.0)

## 2018-07-15 LAB — URINE DRUG SCREEN, QUALITATIVE (ARMC ONLY)
Amphetamines, Ur Screen: NOT DETECTED
Barbiturates, Ur Screen: NOT DETECTED
Benzodiazepine, Ur Scrn: NOT DETECTED
Cannabinoid 50 Ng, Ur ~~LOC~~: NOT DETECTED
Cocaine Metabolite,Ur ~~LOC~~: NOT DETECTED
MDMA (Ecstasy)Ur Screen: NOT DETECTED
Methadone Scn, Ur: NOT DETECTED
Opiate, Ur Screen: NOT DETECTED
Phencyclidine (PCP) Ur S: NOT DETECTED
TRICYCLIC, UR SCREEN: NOT DETECTED

## 2018-07-15 LAB — GLUCOSE, CAPILLARY
Glucose-Capillary: 113 mg/dL — ABNORMAL HIGH (ref 70–99)
Glucose-Capillary: 70 mg/dL (ref 70–99)

## 2018-07-15 LAB — LACTIC ACID, PLASMA
LACTIC ACID, VENOUS: 3.1 mmol/L — AB (ref 0.5–1.9)
Lactic Acid, Venous: 5.5 mmol/L (ref 0.5–1.9)

## 2018-07-15 LAB — PROCALCITONIN: Procalcitonin: 0.46 ng/mL

## 2018-07-15 LAB — TROPONIN I: Troponin I: 0.03 ng/mL (ref <0.03)

## 2018-07-15 SURGERY — IRRIGATION AND DEBRIDEMENT EXTREMITY
Anesthesia: General | Site: Knee | Laterality: Right

## 2018-07-15 MED ORDER — CHLORHEXIDINE GLUCONATE 4 % EX LIQD: 60.0000 mL | Freq: Once | CUTANEOUS | Status: DC

## 2018-07-15 MED ORDER — VANCOMYCIN HCL IN DEXTROSE 1-5 GM/200ML-% IV SOLN
INTRAVENOUS | Status: AC
  Administered 2018-07-15: 1000 mg via INTRAVENOUS
  Filled 2018-07-15: qty 200

## 2018-07-15 MED ORDER — VANCOMYCIN HCL IN DEXTROSE 1-5 GM/200ML-% IV SOLN
1000.0000 mg | INTRAVENOUS | Status: AC
  Administered 2018-07-15: 1000 mg via INTRAVENOUS

## 2018-07-15 MED ORDER — IPRATROPIUM-ALBUTEROL 0.5-2.5 (3) MG/3ML IN SOLN: 3.0000 mL | RESPIRATORY_TRACT | Status: DC | PRN

## 2018-07-15 MED ORDER — SODIUM CHLORIDE 0.9 % IV SOLN
0.0000 ug/min | INTRAVENOUS | Status: DC
  Administered 2018-07-15: 10 ug/min via INTRAVENOUS
  Filled 2018-07-15: qty 1

## 2018-07-15 MED ORDER — EPINEPHRINE PF 1 MG/ML IJ SOLN
INTRAMUSCULAR | Status: AC
  Filled 2018-07-15: qty 1

## 2018-07-15 MED ORDER — EPINEPHRINE PF 1 MG/ML IJ SOLN
INTRAMUSCULAR | Status: DC | PRN
  Administered 2018-07-15 (×2): 1 mg via INTRAVENOUS

## 2018-07-15 MED ORDER — INSULIN ASPART 100 UNIT/ML ~~LOC~~ SOLN
0.0000 [IU] | SUBCUTANEOUS | Status: DC
  Administered 2018-07-16 – 2018-07-21 (×18): 2 [IU] via SUBCUTANEOUS
  Filled 2018-07-15 (×14): qty 1

## 2018-07-15 MED ORDER — CLINDAMYCIN PHOSPHATE 600 MG/50ML IV SOLN
600.0000 mg | Freq: Three times a day (TID) | INTRAVENOUS | Status: DC
  Administered 2018-07-16 (×2): 600 mg via INTRAVENOUS
  Filled 2018-07-15 (×4): qty 50

## 2018-07-15 MED ORDER — CLINDAMYCIN PHOSPHATE 900 MG/50ML IV SOLN
INTRAVENOUS | Status: AC
  Filled 2018-07-15: qty 50

## 2018-07-15 MED ORDER — FAMOTIDINE 20 MG PO TABS
20.0000 mg | ORAL_TABLET | Freq: Once | ORAL | Status: AC
  Administered 2018-07-15: 20 mg via ORAL

## 2018-07-15 MED ORDER — ETOMIDATE 2 MG/ML IV SOLN
INTRAVENOUS | Status: AC
  Filled 2018-07-15: qty 10

## 2018-07-15 MED ORDER — SODIUM CHLORIDE 0.9 % IV SOLN
2.0000 g | Freq: Once | INTRAVENOUS | Status: AC
  Administered 2018-07-15: 2 g via INTRAVENOUS
  Filled 2018-07-15: qty 2

## 2018-07-15 MED ORDER — MIDAZOLAM HCL 2 MG/2ML IJ SOLN
1.0000 mg | INTRAMUSCULAR | Status: DC | PRN
  Filled 2018-07-15: qty 2

## 2018-07-15 MED ORDER — NOREPINEPHRINE-SODIUM CHLORIDE 4-0.9 MG/250ML-% IV SOLN: 0.0000 ug/min | INTRAVENOUS | Status: DC

## 2018-07-15 MED ORDER — EPINEPHRINE PF 1 MG/10ML IJ SOSY
PREFILLED_SYRINGE | INTRAMUSCULAR | Status: DC | PRN
  Administered 2018-07-15: 100 ug via INTRAVENOUS
  Administered 2018-07-15: 500 ug via INTRAVENOUS
  Administered 2018-07-15: 10 ug via INTRAVENOUS
  Administered 2018-07-15 (×2): 300 ug via INTRAVENOUS
  Administered 2018-07-15 (×2): 50 ug via INTRAVENOUS
  Administered 2018-07-15: 30 ug via INTRAVENOUS
  Administered 2018-07-15: 50 ug via INTRAVENOUS
  Administered 2018-07-15: 20 ug via INTRAVENOUS

## 2018-07-15 MED ORDER — EPINEPHRINE PF 1 MG/ML IJ SOLN
0.5000 ug/min | INTRAVENOUS | Status: DC
  Filled 2018-07-15: qty 4

## 2018-07-15 MED ORDER — BUPIVACAINE HCL (PF) 0.25 % IJ SOLN
INTRAMUSCULAR | Status: AC
  Filled 2018-07-15: qty 30

## 2018-07-15 MED ORDER — VASOPRESSIN 20 UNIT/ML IV SOLN
0.0300 [IU]/min | INTRAVENOUS | Status: DC
  Administered 2018-07-15: 0.03 [IU]/min via INTRAVENOUS
  Filled 2018-07-15: qty 2

## 2018-07-15 MED ORDER — FENTANYL 2500MCG IN NS 250ML (10MCG/ML) PREMIX INFUSION
INTRAVENOUS | Status: AC
  Administered 2018-07-15: 50 ug/h via INTRAVENOUS
  Filled 2018-07-15: qty 250

## 2018-07-15 MED ORDER — GENTAMICIN SULFATE 40 MG/ML IJ SOLN
INTRAMUSCULAR | Status: AC
  Filled 2018-07-15: qty 2

## 2018-07-15 MED ORDER — PHENYLEPHRINE HCL 10 MG/ML IJ SOLN
INTRAMUSCULAR | Status: AC
  Filled 2018-07-15: qty 2

## 2018-07-15 MED ORDER — VASOPRESSIN 20 UNIT/ML IV SOLN
INTRAVENOUS | Status: AC
  Filled 2018-07-15: qty 1

## 2018-07-15 MED ORDER — ONDANSETRON HCL 4 MG/2ML IJ SOLN
INTRAMUSCULAR | Status: DC | PRN
  Administered 2018-07-15: 4 mg via INTRAVENOUS

## 2018-07-15 MED ORDER — BUPIVACAINE HCL (PF) 0.25 % IJ SOLN
INTRAMUSCULAR | Status: DC | PRN
  Administered 2018-07-15: 30 mL

## 2018-07-15 MED ORDER — GLYCOPYRROLATE 0.2 MG/ML IJ SOLN
INTRAMUSCULAR | Status: DC | PRN
  Administered 2018-07-15: .2 mg via INTRAVENOUS

## 2018-07-15 MED ORDER — EPINEPHRINE PF 1 MG/10ML IJ SOSY
PREFILLED_SYRINGE | INTRAMUSCULAR | Status: AC
  Filled 2018-07-15: qty 10

## 2018-07-15 MED ORDER — VASOPRESSIN 20 UNIT/ML IV SOLN
INTRAVENOUS | Status: DC | PRN
  Administered 2018-07-15: 4 [IU] via INTRAVENOUS
  Administered 2018-07-15: 2 [IU] via INTRAVENOUS
  Administered 2018-07-15: 4 [IU] via INTRAVENOUS
  Administered 2018-07-15 (×2): 2 [IU] via INTRAVENOUS
  Administered 2018-07-15: 1 [IU] via INTRAVENOUS
  Administered 2018-07-15: 2 [IU] via INTRAVENOUS
  Administered 2018-07-15: 1 [IU] via INTRAVENOUS

## 2018-07-15 MED ORDER — AMIODARONE HCL IN DEXTROSE 360-4.14 MG/200ML-% IV SOLN: 30.0000 mg/h | INTRAVENOUS | Status: DC

## 2018-07-15 MED ORDER — DOPAMINE-DEXTROSE 3.2-5 MG/ML-% IV SOLN
0.0000 ug/kg/min | INTRAVENOUS | Status: DC
  Administered 2018-07-15 – 2018-07-16 (×2): 5 ug/kg/min via INTRAVENOUS
  Filled 2018-07-15 (×2): qty 250

## 2018-07-15 MED ORDER — ETOMIDATE 2 MG/ML IV SOLN
INTRAVENOUS | Status: DC | PRN
  Administered 2018-07-15: 6 mg via INTRAVENOUS
  Administered 2018-07-15: 14 mg via INTRAVENOUS

## 2018-07-15 MED ORDER — FENTANYL CITRATE (PF) 100 MCG/2ML IJ SOLN: 50.0000 ug | Freq: Once | INTRAMUSCULAR | Status: DC

## 2018-07-15 MED ORDER — FAMOTIDINE IN NACL 20-0.9 MG/50ML-% IV SOLN
20.0000 mg | Freq: Two times a day (BID) | INTRAVENOUS | Status: DC
  Administered 2018-07-15 – 2018-07-16 (×2): 20 mg via INTRAVENOUS
  Filled 2018-07-15 (×2): qty 50

## 2018-07-15 MED ORDER — EPHEDRINE SULFATE 50 MG/ML IJ SOLN
INTRAMUSCULAR | Status: AC
  Filled 2018-07-15: qty 1

## 2018-07-15 MED ORDER — DEXAMETHASONE SODIUM PHOSPHATE 10 MG/ML IJ SOLN
INTRAMUSCULAR | Status: AC
  Filled 2018-07-15: qty 1

## 2018-07-15 MED ORDER — VANCOMYCIN HCL IN DEXTROSE 750-5 MG/150ML-% IV SOLN
750.0000 mg | Freq: Once | INTRAVENOUS | Status: AC
  Administered 2018-07-15: 750 mg via INTRAVENOUS
  Filled 2018-07-15: qty 150

## 2018-07-15 MED ORDER — FENTANYL CITRATE (PF) 100 MCG/2ML IJ SOLN
INTRAMUSCULAR | Status: DC | PRN
  Administered 2018-07-15 (×2): 25 ug via INTRAVENOUS

## 2018-07-15 MED ORDER — SODIUM CHLORIDE 0.9 % IV SOLN
0.0000 ug/min | INTRAVENOUS | Status: DC
  Filled 2018-07-15: qty 4

## 2018-07-15 MED ORDER — PHENYLEPHRINE HCL 10 MG/ML IJ SOLN
INTRAMUSCULAR | Status: DC | PRN
Start: 2018-07-15 — End: 2018-07-15
  Administered 2018-07-15: 200 ug via INTRAVENOUS
  Administered 2018-07-15 (×2): 100 ug via INTRAVENOUS
  Administered 2018-07-15: 200 ug via INTRAVENOUS
  Administered 2018-07-15 (×4): 100 ug via INTRAVENOUS

## 2018-07-15 MED ORDER — EPHEDRINE SULFATE 50 MG/ML IJ SOLN
INTRAMUSCULAR | Status: DC | PRN
Start: 2018-07-15 — End: 2018-07-15
  Administered 2018-07-15: 10 mg via INTRAVENOUS

## 2018-07-15 MED ORDER — ONDANSETRON HCL 4 MG/2ML IJ SOLN
INTRAMUSCULAR | Status: AC
  Filled 2018-07-15: qty 2

## 2018-07-15 MED ORDER — FENTANYL CITRATE (PF) 100 MCG/2ML IJ SOLN
INTRAMUSCULAR | Status: AC
  Filled 2018-07-15: qty 2

## 2018-07-15 MED ORDER — AMIODARONE HCL IN DEXTROSE 360-4.14 MG/200ML-% IV SOLN
60.0000 mg/h | INTRAVENOUS | Status: DC
  Filled 2018-07-15: qty 200

## 2018-07-15 MED ORDER — ATROPINE SULFATE 1 MG/10ML IJ SOSY
PREFILLED_SYRINGE | INTRAMUSCULAR | Status: AC
  Filled 2018-07-15: qty 10

## 2018-07-15 MED ORDER — PHENYLEPHRINE HCL 10 MG/ML IJ SOLN
INTRAMUSCULAR | Status: DC | PRN
  Administered 2018-07-15: 50 ug/min via INTRAVENOUS

## 2018-07-15 MED ORDER — FENTANYL 2500MCG IN NS 250ML (10MCG/ML) PREMIX INFUSION
25.0000 ug/h | INTRAVENOUS | Status: DC
  Administered 2018-07-15: 50 ug/h via INTRAVENOUS
  Administered 2018-07-16: 200 ug/h via INTRAVENOUS
  Administered 2018-07-16: 100 ug/h via INTRAVENOUS
  Administered 2018-07-17: 150 ug/h via INTRAVENOUS
  Filled 2018-07-15 (×3): qty 250

## 2018-07-15 MED ORDER — VANCOMYCIN HCL IN DEXTROSE 750-5 MG/150ML-% IV SOLN
750.0000 mg | INTRAVENOUS | Status: DC
  Filled 2018-07-15: qty 150

## 2018-07-15 MED ORDER — MIDAZOLAM HCL 2 MG/2ML IJ SOLN
1.0000 mg | INTRAMUSCULAR | Status: DC | PRN
  Administered 2018-07-16: 1 mg via INTRAVENOUS
  Filled 2018-07-15 (×3): qty 2

## 2018-07-15 MED ORDER — ATROPINE SULFATE 0.4 MG/ML IJ SOLN
INTRAMUSCULAR | Status: DC | PRN
  Administered 2018-07-15: 0.4 mg via INTRAVENOUS

## 2018-07-15 MED ORDER — HYDROCORTISONE NA SUCCINATE PF 100 MG IJ SOLR
50.0000 mg | Freq: Four times a day (QID) | INTRAMUSCULAR | Status: DC
  Administered 2018-07-15 – 2018-07-18 (×11): 50 mg via INTRAVENOUS
  Filled 2018-07-15 (×11): qty 2

## 2018-07-15 MED ORDER — FAMOTIDINE 20 MG PO TABS
ORAL_TABLET | ORAL | Status: AC
  Administered 2018-07-15: 20 mg via ORAL
  Filled 2018-07-15: qty 1

## 2018-07-15 MED ORDER — SUCCINYLCHOLINE CHLORIDE 20 MG/ML IJ SOLN
INTRAMUSCULAR | Status: DC | PRN
  Administered 2018-07-15: 100 mg via INTRAVENOUS

## 2018-07-15 MED ORDER — MIDAZOLAM HCL 2 MG/2ML IJ SOLN
INTRAMUSCULAR | Status: AC
  Filled 2018-07-15: qty 2

## 2018-07-15 MED ORDER — NOREPINEPHRINE 16 MG/250ML-% IV SOLN
0.0000 ug/min | INTRAVENOUS | Status: DC
  Administered 2018-07-15: 5 ug/min via INTRAVENOUS
  Administered 2018-07-16: 8 ug/min via INTRAVENOUS
  Administered 2018-07-17: 12 ug/min via INTRAVENOUS
  Administered 2018-07-19: 9 ug/min via INTRAVENOUS
  Filled 2018-07-15 (×4): qty 250

## 2018-07-15 MED ORDER — EPINEPHRINE PF 1 MG/ML IJ SOLN
INTRAMUSCULAR | Status: AC
  Filled 2018-07-15: qty 2

## 2018-07-15 MED ORDER — ACETAMINOPHEN 10 MG/ML IV SOLN
INTRAVENOUS | Status: AC
  Filled 2018-07-15: qty 100

## 2018-07-15 MED ORDER — FENTANYL BOLUS VIA INFUSION
25.0000 ug | INTRAVENOUS | Status: DC | PRN
  Filled 2018-07-15: qty 25

## 2018-07-15 MED ORDER — GENTAMICIN SULFATE 40 MG/ML IJ SOLN
INTRAMUSCULAR | Status: DC | PRN
  Administered 2018-07-15: 80 mg via INTRAMUSCULAR
  Administered 2018-07-15: 240 mg via INTRAMUSCULAR

## 2018-07-15 MED ORDER — AMIODARONE LOAD VIA INFUSION
150.0000 mg | Freq: Once | INTRAVENOUS | Status: AC
  Administered 2018-07-15: 150 mg via INTRAVENOUS
  Filled 2018-07-15: qty 83.34

## 2018-07-15 MED ORDER — SODIUM CHLORIDE 0.9 % IV SOLN
INTRAVENOUS | Status: DC
  Administered 2018-07-15: 16:00:00 via INTRAVENOUS

## 2018-07-15 MED ORDER — SODIUM CHLORIDE 0.9 % IV SOLN
2.0000 g | INTRAVENOUS | Status: DC
  Filled 2018-07-15: qty 2

## 2018-07-15 MED ORDER — ROCURONIUM BROMIDE 100 MG/10ML IV SOLN
INTRAVENOUS | Status: DC | PRN
  Administered 2018-07-15: 20 mg via INTRAVENOUS

## 2018-07-15 MED ORDER — SODIUM CHLORIDE 0.9 % IR SOLN
Status: DC | PRN
  Administered 2018-07-15: 500 mL

## 2018-07-15 SURGICAL SUPPLY — 34 items
BANDAGE ACE 4X5 VEL STRL LF (GAUZE/BANDAGES/DRESSINGS) ×3 IMPLANT
BANDAGE ELASTIC 3 LF NS (GAUZE/BANDAGES/DRESSINGS) ×3 IMPLANT
BNDG ESMARK 4X12 TAN STRL LF (GAUZE/BANDAGES/DRESSINGS) ×3 IMPLANT
CANISTER SUCT 1200ML W/VALVE (MISCELLANEOUS) ×3 IMPLANT
CAST PADDING 3X4FT ST 30246 (SOFTGOODS) ×2
CAST PADDING 6X4YD ST 30248 (SOFTGOODS) ×2
COVER WAND RF STERILE (DRAPES) ×3 IMPLANT
CUFF TOURN 24 STER (MISCELLANEOUS) ×3 IMPLANT
DRSG DERMACEA 8X12 NADH (GAUZE/BANDAGES/DRESSINGS) ×3 IMPLANT
DRSG OPSITE POSTOP 4X12 (GAUZE/BANDAGES/DRESSINGS) ×3 IMPLANT
DURAPREP 26ML APPLICATOR (WOUND CARE) ×3 IMPLANT
ELECT REM PT RETURN 9FT ADLT (ELECTROSURGICAL) ×3
ELECTRODE REM PT RTRN 9FT ADLT (ELECTROSURGICAL) ×1 IMPLANT
GAUZE SPONGE 4X4 12PLY STRL (GAUZE/BANDAGES/DRESSINGS) ×3 IMPLANT
GLOVE BIOGEL M STRL SZ7.5 (GLOVE) ×3 IMPLANT
GOWN STRL REUS W/ TWL LRG LVL3 (GOWN DISPOSABLE) ×2 IMPLANT
GOWN STRL REUS W/TWL LRG LVL3 (GOWN DISPOSABLE) ×4
HANDPIECE INTERPULSE COAX TIP (DISPOSABLE) ×2
HANDPIECE VERSAJET DEBRIDEMENT (MISCELLANEOUS) ×3 IMPLANT
IV NS IRRIG 3000ML ARTHROMATIC (IV SOLUTION) ×3 IMPLANT
KIT TURNOVER CYSTO (KITS) ×3 IMPLANT
NS IRRIG 1000ML POUR BTL (IV SOLUTION) ×3 IMPLANT
PACK EXTREMITY ARMC (MISCELLANEOUS) ×3 IMPLANT
PAD CAST CTTN 3X4 STRL (SOFTGOODS) ×1 IMPLANT
PAD PREP 24X41 OB/GYN DISP (PERSONAL CARE ITEMS) ×3 IMPLANT
PADDING CAST COTTON 6X4 ST (SOFTGOODS) ×1 IMPLANT
SET HNDPC FAN SPRY TIP SCT (DISPOSABLE) ×1 IMPLANT
STAPLER SKIN PROX 35W (STAPLE) ×3 IMPLANT
STOCKINETTE STRL 4IN 9604848 (GAUZE/BANDAGES/DRESSINGS) ×3 IMPLANT
SUT ETHILON 4 0 P 3 18 (SUTURE) ×3 IMPLANT
SUT MNCRL 0 1X36 CT-1 (SUTURE) ×1 IMPLANT
SUT MON AB 2-0 CT1 36 (SUTURE) ×3 IMPLANT
SUT MONOCRYL 0 (SUTURE) ×2
SUT PROLENE 1 CT 1 30 (SUTURE) ×3 IMPLANT

## 2018-07-15 NOTE — Progress Notes (Signed)
RT responded to code blue OR 1. Patient transported to ICU room 9 via ambu bag and o2 tank. Patient transported without complications and placed on ventilator in ICU. Report given to ICU RRT.

## 2018-07-15 NOTE — Anesthesia Preprocedure Evaluation (Signed)
Anesthesia Evaluation  Patient identified by MRN, date of birth, ID band Patient awake    Reviewed: Allergy & Precautions, H&P , NPO status , Patient's Chart, lab work & pertinent test results  History of Anesthesia Complications Negative for: history of anesthetic complications  Airway Mallampati: III  TM Distance: <3 FB Neck ROM: limited    Dental  (+) Chipped   Pulmonary shortness of breath and with exertion, COPD, former smoker,           Cardiovascular Exercise Tolerance: Good hypertension, (-) angina+ CAD, + Past MI, + Cardiac Stents, + CABG and +CHF       Neuro/Psych negative neurological ROS  negative psych ROS   GI/Hepatic negative GI ROS, Neg liver ROS,   Endo/Other  negative endocrine ROS  Renal/GU DialysisRenal disease     Musculoskeletal  (+) Arthritis ,   Abdominal   Peds  Hematology negative hematology ROS (+)   Anesthesia Other Findings Past Medical History: No date: Anemia No date: CAD (coronary artery disease)     Comment:  s/p percutaneous coronary angioplasty, s/p CABG x 6  No date: ESRD (end stage renal disease) ([facility]) No date: Heart attack ([place]) No date: History of radiation therapy     Comment:  for prostate CA No date: Hyperlipidemia No date: Hyperparathyroidism, secondary renal ([place]) No date: Hypertension No date: Osteoarthritis No date: Polycystic kidney disease 2012: Prostate CA ([place])     Comment:  Brachytherapy No date: Renal insufficiency     Comment:  currently on dialysis No date: Shingles  Past Surgical History: No date: AORTIC VALVE REPLACEMENT 07/24/2015: CARDIAC CATHETERIZATION; N/A     Comment:  Procedure: Coronary/Graft Angiography;  Surgeon: Corey Skains, MD;  Location: [facility];  Service:               Cardiovascular;  Laterality: N/A; 06/03/2016: CARDIAC CATHETERIZATION; N/A     Comment:  Procedure: Left Heart Cath and Coronary  Angiography;                Surgeon: Isaias Cowman, MD;  Location: [facility];  Service: Cardiovascular;  Laterality:               N/A; No date: CORONARY ARTERY BYPASS GRAFT No date: HERNIA REPAIR No date: JOINT REPLACEMENT No date: REPLACEMENT TOTAL KNEE BILATERAL No date: ROTATOR CUFF REPAIR; Right  BMI    Body Mass Index:  24.61 kg/m      Reproductive/Obstetrics negative OB ROS                             Anesthesia Physical  Anesthesia Plan  ASA: IV  Anesthesia Plan: General   Post-op Pain Management:    Induction: Intravenous  PONV Risk Score and Plan: Ondansetron and Dexamethasone  Airway Management Planned: LMA  Additional Equipment:   Intra-op Plan:   Post-operative Plan: Extubation in OR  Informed Consent: I have reviewed the patients History and Physical, chart, labs and discussed the procedure including the risks, benefits and alternatives for the proposed anesthesia with the patient or authorized representative who has indicated his/her understanding and acceptance.     Dental Advisory Given  Plan Discussed with: Anesthesiologist, CRNA and Surgeon  Anesthesia Plan Comments: (Patient informed that they are higher risk  for complications from anesthesia during this procedure due to their medical history.  Patient voiced understanding.  Patient consented for risks of anesthesia including but not limited to:  - adverse reactions to medications - damage to teeth, lips or other oral mucosa - sore throat or hoarseness - Damage to heart, brain, lungs or loss of life  Patient voiced understanding.)        Anesthesia Quick Evaluation

## 2018-07-15 NOTE — Consult Note (Signed)
PULMONARY / CRITICAL CARE MEDICINE   Name: Jimmy Carson MRN: [id number] DOB: 05/30/1941    ADMISSION DATE:  06/27/2018 CONSULTATION DATE:  07/19/2018  REFERRING MD:  Dr. Marry Guan  CHIEF COMPLAINT:  Severe Hypotension  BRIEF DISCUSSION: 78 y.o. Male who was undergoing elective I&D of Right knee wound, as he was being closed he had episodes of hypotension that did not respond to pressors.  CODE BLUE was called by Anesthesia, pt did not lose pulse, but he did receive approximately 4 minutes of CPR to help with perfusion and 2 doses of Epinephrine.  He returns to ICU post code, intubated.  He is admitted for treatment of Cardiogenic shock +/- Septic shock.  HISTORY OF PRESENT ILLNESS:   Jimmy Carson is a 78 y.o. Male with a PMH as listed below who underwent elective Incision & Drainage of right knee wound.  He had a previous total right knee revision approximately 8 weeks ago, of which has been complicated by a central area of wound dehiscence with drainage, thus why he underwent today's procedure. Plan was for pt to be discharged home post procedure, and to received his normal scheduled Hemodialysis session tomorrow.  While his incision was being closed up in the OR, he had episodes of severe hypotension that was not responding to vasopressors, therefore CODE BLUE was called by anesthesia.  He NEVER lost his pulse, but he did receive approximately 4 minutes of CPR to assist with circulation, and he received 2 rounds of epinephrine.  He returns to ICU post procedure, intubated. Initial workup upon arrival to ICU reveals Creatinine 5.94, Anion gap 18, Troponin 0.03, BNP > 4500, Procalcitonin 0.46, Lactic acid 5.5, WBC 5.8, and Hemoglobin 9.9. EKG not concerning for STEMI.  CXR is concerning for cardiomegaly with vascular congestion, left pleural effusion, and left base atelectasis vs infiltrate.  He is admitted to [facility] ICU for treatment of Cardiogenic shock +/- Septic shock.  PCCM is consulted for  further management.  PAST MEDICAL HISTORY :  He  has a past medical history of Anemia, CAD (coronary artery disease), ESRD (end stage renal disease) ([place]), Heart attack ([place]) (2012), History of radiation therapy, Hyperlipidemia, Hyperparathyroidism, secondary renal ([place]), Hypertension, Osteoarthritis, Polycystic kidney disease, Prostate CA ([place]) (2012), Renal insufficiency, and Shingles.  PAST SURGICAL HISTORY: He  has a past surgical history that includes Coronary artery bypass graft; Aortic valve replacement; Replacement total knee bilateral; Joint replacement; Rotator cuff repair (Right); Cardiac catheterization (N/A, 07/24/2015); Cardiac catheterization (N/A, 06/03/2016); Hernia repair; and Total knee revision (Right, [id number]).  Allergies  Allergen Reactions  . Orange Fruit [Citrus] Hives  . Penicillins Hives and Other (See Comments)    Has patient had a PCN reaction causing immediate rash, facial/tongue/throat swelling, SOB or lightheadedness with hypotension: Yes Has patient had a PCN reaction causing severe rash involving mucus membranes or skin necrosis: No Has patient had a PCN reaction that required hospitalization No Has patient had a PCN reaction occurring within the last 10 years: No If all of the above answers are "NO", then may proceed with Cephalosporin use.     No current facility-administered medications on file prior to encounter.    Current Outpatient Medications on File Prior to Encounter  Medication Sig  . acetaminophen (TYLENOL) 500 MG tablet Take 1,000 mg by mouth every 6 (six) hours as needed for mild pain or headache.   . cinacalcet (SENSIPAR) 60 MG tablet Take 60 mg by mouth every evening.   . clopidogrel (PLAVIX) 75 MG tablet Take  75 mg by mouth daily.  . isosorbide mononitrate (IMDUR) 60 MG 24 hr tablet Take 60 mg by mouth daily.  . midodrine (PROAMATINE) 10 MG tablet Take 1 tablet (10 mg total) by mouth 3 (three) times daily with meals.  . pyridOXINE  (VITAMIN B-6) 100 MG tablet Take 100 mg by mouth daily.  . rosuvastatin (CRESTOR) 10 MG tablet Take 10 mg by mouth daily.  . sevelamer carbonate (RENVELA) 800 MG tablet Take 1,600-2,400 mg by mouth See admin instructions. Take 2400 mg by mouth 3 times daily with meals and take 1600 mg by mouth with snacks  . cephALEXin (KEFLEX) 500 MG capsule Take 500 mg by mouth 2 (two) times daily.  . nitroGLYCERIN (NITROSTAT) 0.4 MG SL tablet Place 1 tablet (0.4 mg total) under the tongue every 5 (five) minutes as needed for chest pain. (Patient not taking: Reported on 07/05/2018)    FAMILY HISTORY:  His He indicated that his mother is deceased. He indicated that his father is deceased.   SOCIAL HISTORY: He  reports that he quit smoking about 5 years ago. He has never used smokeless tobacco. He reports that he does not drink alcohol or use drugs.  REVIEW OF SYSTEMS:   Unable to obtain due to sedation and intubation  SUBJECTIVE:  Unable to obtain due to sedation and intubation  VITAL SIGNS: BP (!) 72/57   Pulse 75   Temp (!) 94 F (34.4 C) (Axillary)   Resp (!) 21   SpO2 100%   HEMODYNAMICS:    VENTILATOR SETTINGS: Vent Mode: PRVC FiO2 (%):  [60 %-100 %] 60 % Set Rate:  [18 bmp-20 bmp] 20 bmp Vt Set:  [500 mL] 500 mL PEEP:  [8 cmH20] 8 cmH20 Plateau Pressure:  [18 cmH20] 18 cmH20  INTAKE / OUTPUT: No intake/output data recorded.  PHYSICAL EXAMINATION: General:  Critically ill appearing male, laying in bed, intubated and sedated, in NAD Neuro:  Sedated, withdraws to ain HEENT:  Pupils PERRL 2 mm sluggish bilaterally, atraumatic, normocephalic, neck supple, + JVD Cardiovascular:  Wide complex tachycardia, s1s2, no M/R/G, 2+ radial pulses, 1+ pedal pulses Lungs:  Coarse breath sounds throughout, even, nonlabored, no assessory muscle use, vent assisted Abdomen:  Soft, nontender,nondistended, no guarding or rebound tenderness, BS hypoactive Musculoskeletal:  No deformities, normal bulk  and tone, 2+ edema bilateral LE Skin:  Cool dry. No obvious rashes, lesions, or ulcerations.  Surgical incision to right knee with honeycomb dressing clean dry and intact  LABS:  BMET Recent Labs  Lab 07/14/18 1056 06/27/2018 1543 06/24/2018 1933  NA 137 134* 136  K 3.9 4.8 4.4  CL 92*  --  95*  CO2 34*  --  23  BUN 30*  --  40*  CREATININE 4.54*  --  5.94*  GLUCOSE 86 82 106*    Electrolytes Recent Labs  Lab 07/14/18 1056 06/30/2018 1933  CALCIUM 7.7* 7.5*    CBC Recent Labs  Lab 07/14/18 1056 07/14/2018 1543 07/14/2018 1933  WBC 5.4  --  5.8  HGB 8.3* 10.2* 9.9*  HCT 28.3* 30.0* 35.3*  PLT 143*  --  161    Coag's Recent Labs  Lab 07/14/18 1056  INR 1.21    Sepsis Markers Recent Labs  Lab 07/11/2018 1933 07/05/2018 1936  LATICACIDVEN  --  5.5*  PROCALCITON 0.46  --     ABG Recent Labs  Lab 07/19/2018 1930  PHART 7.29*  PCO2ART 44  PO2ART 302*    Liver Enzymes Recent Labs  Lab 07/01/2018 1933  AST 43*  ALT 19  ALKPHOS 89  BILITOT 0.9  ALBUMIN 2.7*    Cardiac Enzymes Recent Labs  Lab 06/29/2018 1933  TROPONINI 0.03*    Glucose Recent Labs  Lab 07/06/2018 1918  GLUCAP 70    Imaging Portable Chest X-ray  Result Date: 06/29/2018 CLINICAL DATA:  78 year old male status post intubation. EXAM: PORTABLE CHEST 1 VIEW COMPARISON:  Chest radiograph dated 06/27/2018 FINDINGS: An endotracheal tube is noted with tip approximately 5 cm above the carina. An enteric tube extends into the left upper abdomen with tip and side-port the region of the body of the stomach. Left-sided pleural effusion and left lung base atelectasis or infiltrate. Overall slight the improved aeration of the left lung compared to the prior radiograph. There is cardiomegaly with bilateral hilar vascular prominence and congestion. No pneumothorax. Median sternotomy wires, CABG vascular clips, and aortic valve repair. No bowel dilatation in the visualized abdomen. Osteopenia with scoliosis  and degenerative changes of the spine. IMPRESSION: 1. Endotracheal tube above the carina and enteric tube within the stomach. 2. Cardiomegaly with vascular congestion. 3. Slight interval improvement in aeration of the left lung and slight decrease in left-sided pleural effusion and left lung base atelectasis or infiltrate. Electronically Signed   By: Anner Crete M.D.   On: 07/09/2018 20:50   Dg Abd Portable 1v  Result Date: 07/04/2018 CLINICAL DATA:  78 year old male status post intubation. EXAM: PORTABLE CHEST 1 VIEW COMPARISON:  Chest radiograph dated 06/27/2018 FINDINGS: An endotracheal tube is noted with tip approximately 5 cm above the carina. An enteric tube extends into the left upper abdomen with tip and side-port the region of the body of the stomach. Left-sided pleural effusion and left lung base atelectasis or infiltrate. Overall slight the improved aeration of the left lung compared to the prior radiograph. There is cardiomegaly with bilateral hilar vascular prominence and congestion. No pneumothorax. Median sternotomy wires, CABG vascular clips, and aortic valve repair. No bowel dilatation in the visualized abdomen. Osteopenia with scoliosis and degenerative changes of the spine. IMPRESSION: 1. Endotracheal tube above the carina and enteric tube within the stomach. 2. Cardiomegaly with vascular congestion. 3. Slight interval improvement in aeration of the left lung and slight decrease in left-sided pleural effusion and left lung base atelectasis or infiltrate. Electronically Signed   By: Anner Crete M.D.   On: 07/13/2018 20:50     STUDIES:  Echocardiogram 1/23>> Venous Ultrasound bilateral LE 1/23>> No evidence of deep venous thrombosis. Limited evaluation of left saphenofemoral junction due to groin bandage  CULTURES: Wound R Knee 1/22>> Blood x2 1/22>> Urine 1/22>> Sputum 1/22>>  ANTIBIOTICS: Clindamycin 1/22>> Vancomycin 1/22>> Cefepime 1/22>>  SIGNIFICANT  EVENTS: 1/22>> Elective I&D of Right knee 1/22>> Hypotension at end of procedure, required transfer to ICU  LINES/TUBES: ETT 1/22>> Right Femoral CVC 1/22>>   ASSESSMENT / PLAN:  PULMONARY A: Left intubated for airway protection CXR w/ cardiomegaly and vascular congestion,  Left pleural effusion, and Left base atelectasis vs. infiltrate  P:   Full vent support, PRVC: 8 cc/kg Wean FiO2 & PEEP as tolerated Follow intermittent CXR and ABG SBT when parameters met VAP Bundle Unable to diurese given hypotension and vasopressor requirements Continue empiric antibiotics as above IR consulted for Thoracentesis  CARDIOVASCULAR A:  Cardiogenic Shock +/- Septic Shock Acute on Chronic Systolic CHF  Wide complex Tachycardia>>improved Mildly elevated Troponin, likely demand ischemia -EKG with no ischemic changes P:  Cardiac monitoring Maintain MAP >65 Unable to  bolus with IVF given CHF Levophed, Vasopressin to maintain MAP goal Stress dose steroids Unable to diureses given hypotension and vasopressor requirements Received Amiodarone 150 mg bolus, rate improved with bolus, will hold off on continuous Amiodarone infusion Trend Troponin Cardiology consulted, appreciate input Obtain Echocardiogram  RENAL A:   ESRD on HD Anion Gap Metabolic Acidosis in setting of Lactic Acidosis P:   Monitor I&O's / urinary output Follow BMP Ensure adequate renal perfusion Avoid nephrotoxic agents as able Replace electrolytes as indicated Nephrology consulted, appreciate input HD as per Nephrology Trend Lactic acid   GASTROINTESTINAL A:   No acute issues P:   NPO  Pepcid for SUP  HEMATOLOGIC A:   Anemia without signs of active bleeding P:  Monitor for S/Sx of bleeding Trend CBC SCD's for VTE Prophylaxis  Transfuse for Hgb <7   INFECTIOUS A:   Meets SIRS Criteria Right knee with drainage, s/p I&D 1/22 CXR with Left base infiltrate vs Atelectasis Mildly elevated  Procalcitonin P:   Monitor fever curve Trend WBC's and Procalcitonin Follow Pan cultures Continue Vancomycin, Cefepime, and Clindamycin  ENDOCRINE A:   Hyperglycemia   P:   CBG's  SSI Follow ICU Hypo/hyperglycemia protocol  NEUROLOGIC A:   Acute Metabolic Encephalopathy Sedation needs in setting of mechanical intubation P:   RASS goal: 0 to -1 Fentanyl gtt to maintain RASS goal Avoid sedating meds as able Daily WUA If pt without purposeful movement upon daily WUA, will need to obtain CT Head Provide supportive care   FAMILY  - Updates: Updated pt's wife and daughter at bedside 06/28/2018.  They both confirm pt is a Full Code.  - Inter-disciplinary family meet or Palliative Care meeting due by:  Aug 08, 2018    Darel Hong, AGACNP-BC [facility] Pager: [phone] Cell: [phone]  06/27/2018, 10:02 PM

## 2018-07-15 NOTE — Transfer of Care (Signed)
Immediate Anesthesia Transfer of Care Note  Patient: Jimmy Carson  Procedure(s) Performed: IRRIGATION AND DEBRIDEMENT AT CLOSURE OF RIGHT KNEE WOUND (Right Knee)  Patient Location: ICU  Anesthesia Type:General  Level of Consciousness: unresponsive  Airway & Oxygen Therapy: Patient remains intubated per anesthesia plan  Post-op Assessment: Report given to RN  Post vital signs: Reviewed  Last Vitals:  Vitals Value Taken Time  BP 95/60 07/09/2018  7:30 PM  Temp    Pulse 105 07/04/2018  7:32 PM  Resp 16 07/17/2018  7:32 PM  SpO2 87 % 07/19/2018  7:32 PM  Vitals shown include unvalidated device data.  Last Pain:  Vitals:   07/08/2018 1546  TempSrc: Temporal  PainSc: 3          Complications: cardiovascular complications

## 2018-07-15 NOTE — Consult Note (Signed)
[facility] Consultation Note  Patient ID: Jimmy Carson, MRN: [id number], DOB/AGE: [birth year] 78 y.o. Admit date: 07/17/2018   Date of Consult: 06/26/2018 Primary Physician: Dion Body, MD Primary Cardiologist: Paraschos  Chief Complaint: No chief complaint on file.  Reason for Consult: Acute vascular and cardiac arrest  HPI: 78 y.o. 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  The patient has had known chronic systolic dysfunction congestive heart failure on appropriate medication management.  The patient had recently had knee surgery for which he had tolerated well.  He did have wound dehiscence of his knee surgery and needed further treatment.  While under anesthesia the patient had significantly dropping blood pressure and tachycardia but continued pulse throughout this issue.  The patient was given multiple medications and vasopressin and epi which did hold his pressure and heart rate.  He did not have any appropriate responsiveness and therefore had CPR for short period of time.  The patient was unconscious throughout the whole time and was unable to further evaluate.  The patient had no evidence of respiratory distress and or pulmonary issues.  After CPR patient was given amiodarone due to concerns of rhythm disturbances although currently in sinus tachycardia with a bundle branch block.  His most consistent with his previous EKG findings normal sinus rhythm with first-degree AV block with left axis deviation and right bundle branch block.  Currently the patient does have more hemodynamic stability with pressors and no evidence of heart rate issues with continued pulse  Past Medical History:  Diagnosis Date  . Anemia   . CAD (coronary artery disease)    s/p  percutaneous coronary angioplasty, s/p CABG x 6   . ESRD (end stage renal disease) ([place])   . Heart attack ([place]) 2012  . History of radiation therapy    for prostate CA  . Hyperlipidemia   . Hyperparathyroidism, secondary renal ([place])   . Hypertension   . Osteoarthritis   . Polycystic kidney disease   . Prostate CA [facility]) 2012   Brachytherapy  . Renal insufficiency    currently on dialysis  . Shingles       Surgical History:  Past Surgical History:  Procedure Laterality Date  . AORTIC VALVE REPLACEMENT    . CARDIAC CATHETERIZATION N/A 07/24/2015   Procedure: Coronary/Graft Angiography;  Surgeon: Corey Skains, MD;  Location: [place] [facility];  Service: Cardiovascular;  Laterality: N/A;  . CARDIAC CATHETERIZATION N/A 06/03/2016   Procedure: Left Heart Cath and Coronary Angiography;  Surgeon: Isaias Cowman, MD;  Location: [place] [facility];  Service: Cardiovascular;  Laterality: N/A;  . CORONARY ARTERY BYPASS GRAFT    . HERNIA REPAIR    . JOINT REPLACEMENT    . REPLACEMENT TOTAL KNEE BILATERAL    . ROTATOR CUFF REPAIR Right   . TOTAL KNEE REVISION Right [id number]   Procedure: TOTAL KNEE REVISION;  Surgeon: Dereck Leep, MD;  Location: [facility] OR[facility];  Service: Orthopedics;  Laterality: Right;     Home Meds: Prior to Admission medications   Medication Sig Start Date End Date Taking? Authorizing Provider  acetaminophen (TYLENOL) 500 MG tablet Take 1,000 mg by mouth every 6 (six) hours as needed for mild pain or headache.    Yes [provider]  cinacalcet (SENSIPAR) 60 MG tablet Take 60 mg by mouth  every evening.    Yes [provider]  clopidogrel (PLAVIX) 75 MG tablet Take 75 mg by mouth daily.   Yes [provider]  isosorbide mononitrate (IMDUR) 60 MG 24 hr tablet Take 60 mg by mouth daily.   Yes [provider]  midodrine (PROAMATINE) 10 MG tablet Take 1 tablet (10 mg total) by mouth 3 (three) times daily with meals. 06/27/18  Yes  Wieting, Richard, MD  pyridOXINE (VITAMIN B-6) 100 MG tablet Take 100 mg by mouth daily.   Yes [provider]  rosuvastatin (CRESTOR) 10 MG tablet Take 10 mg by mouth daily.   Yes [provider]  sevelamer carbonate (RENVELA) 800 MG tablet Take 1,600-2,400 mg by mouth See admin instructions. Take 2400 mg by mouth 3 times daily with meals and take 1600 mg by mouth with snacks   Yes [provider]  cephALEXin (KEFLEX) 500 MG capsule Take 500 mg by mouth 2 (two) times daily.    [provider]  nitroGLYCERIN (NITROSTAT) 0.4 MG SL tablet Place 1 tablet (0.4 mg total) under the tongue every 5 (five) minutes as needed for chest pain. Patient not taking: Reported on 07/10/2018 06/03/16   Max Sane, MD    Inpatient Medications:  . amiodarone  150 mg Intravenous Once  . fentaNYL (SUBLIMAZE) injection  50 mcg Intravenous Once  . hydrocortisone sod succinate (SOLU-CORTEF) inj  50 mg Intravenous Q6H   . amiodarone     Followed by  . [START ON 07/16/2018] amiodarone    . famotidine (PEPCID) IV    . fentaNYL infusion INTRAVENOUS 50 mcg/hr (07/21/2018 1943)  . phenylephrine (NEO-SYNEPHRINE) Adult infusion    . vasopressin (PITRESSIN) infusion - *FOR SHOCK*      Allergies:  Allergies  Allergen Reactions  . Orange Fruit [Citrus] Hives  . Penicillins Hives and Other (See Comments)    Has patient had a PCN reaction causing immediate rash, facial/tongue/throat swelling, SOB or lightheadedness with hypotension: Yes Has patient had a PCN reaction causing severe rash involving mucus membranes or skin necrosis: No Has patient had a PCN reaction that required hospitalization No Has patient had a PCN reaction occurring within the last 10 years: No If all of the above answers are "NO", then may proceed with Cephalosporin use.     Social History   Socioeconomic History  . Marital status: Widowed    Spouse name: Not on file  . Number of children: Not on file  . Years  of education: Not on file  . Highest education level: Not on file  Occupational History  . Not on file  Social Needs  . Financial resource strain: Not on file  . Food insecurity:    Worry: Not on file    Inability: Not on file  . Transportation needs:    Medical: Not on file    Non-medical: Not on file  Tobacco Use  . Smoking status: Former Smoker    Last attempt to quit: 04/19/2013    Years since quitting: 5.2  . Smokeless tobacco: Never Used  Substance and Sexual Activity  . Alcohol use: No    Comment: occasional  . Drug use: No  . Sexual activity: Not on file  Lifestyle  . Physical activity:    Days per week: Not on file    Minutes per session: Not on file  . Stress: Not on file  Relationships  . Social connections:    Talks on phone: Not on file  Gets together: Not on file    Attends religious service: Not on file    Active member of club or organization: Not on file    Attends meetings of clubs or organizations: Not on file    Relationship status: Not on file  . Intimate partner violence:    Fear of current or ex partner: Not on file    Emotionally abused: Not on file    Physically abused: Not on file    Forced sexual activity: Not on file  Other Topics Concern  . Not on file  Social History Narrative  . Not on file     Family History  Problem Relation Age of Onset  . Polycystic kidney disease Father   . Aneurysm Father      Review of Systems Not assessed due to intubation and sedation Labs: No results for input(s): CKTOTAL, CKMB, TROPONINI in the last 72 hours. Lab Results  Component Value Date   WBC 5.4 07/14/2018   HGB 10.2 (L) 07/12/2018   HCT 30.0 (L) 07/11/2018   MCV 94.6 07/14/2018   PLT 143 (L) 07/14/2018    Recent Labs  Lab 07/14/18 1056 07/23/2018 1543  NA 137 134*  K 3.9 4.8  CL 92*  --   CO2 34*  --   BUN 30*  --   CREATININE 4.54*  --   CALCIUM 7.7*  --   GLUCOSE 86 82   No results found for: CHOL, HDL, LDLCALC, TRIG No  results found for: DDIMER  Radiology/Studies:  Dg Chest 2 View  Result Date: 06/27/2018 CLINICAL DATA:  Left-sided pleural effusion. Follow-up. EXAM: CHEST - 2 VIEW COMPARISON:  06/26/2018 FINDINGS: Previous median sternotomy and CABG. Previous aortic valve replacement. Right chest remains clear. Persistent left effusion with atelectasis and or infiltrate in the left lower lung. Findings may be slightly worsened on the left. IMPRESSION: Persistent left effusion with atelectasis and or infiltrate in the left lower lung. Findings may be slightly worsened on the left. Electronically Signed   By: Nelson Chimes M.D.   On: 06/27/2018 08:15   Dg Chest 2 View  Result Date: 06/25/2018 CLINICAL DATA:  Shortness of breath EXAM: CHEST - 2 VIEW COMPARISON:  06/11/2018 FINDINGS: Post sternotomy changes with valve prosthesis. Small left-sided pleural effusion, slightly increased compared to prior. Trace right pleural effusion. Cardiomegaly with vascular congestion. Patchy airspace disease at the left base. Aortic atherosclerosis. No pneumothorax. IMPRESSION: 1. Small left-sided pleural effusion, slight increase as compared to radiograph 06/11/2018. Trace right pleural effusion 2. Cardiomegaly with vascular congestion. Patchy airspace disease at the left lung base. Electronically Signed   By: Donavan Foil M.D.   On: 06/25/2018 18:50   Dg Chest Port 1 View  Result Date: 06/26/2018 CLINICAL DATA:  Status post left-sided thoracentesis EXAM: PORTABLE CHEST 1 VIEW COMPARISON:  June 25, 2018 FINDINGS: No evident pneumothorax. There is a small at least partially loculated pleural effusion on the left. There is patchy consolidation in the left base. The right lung is clear. Heart is mildly enlarged with pulmonary vascularity normal. 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 There is aortic atherosclerosis. No evident adenopathy. There is postoperative change in the right shoulder with  remodeling of the right humeral head, stable. IMPRESSION: No pneumothorax. Partially loculated pleural effusion left base with left base consolidation, likely atelectasis with potential superimposed pneumonia. Right lung clear. Stable cardiac silhouette. There is aortic atherosclerosis. Postoperative change in the right shoulder with remodeling right  humeral head. Aortic Atherosclerosis (ICD10-I70.0). Electronically Signed   By: Lowella Grip III M.D.   On: 06/26/2018 10:05   Dg Knee Complete 4 Views Right  Result Date: 07/12/2018 CLINICAL DATA:  Pain following fall EXAM: RIGHT KNEE - COMPLETE 4+ VIEW COMPARISON:  May 22, 2018 FINDINGS: Frontal, lateral, and bilateral oblique views were obtained. 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 No acute fracture or dislocation. There is a moderate joint effusion. There is extensive soft tissue swelling anteriorly. There is extensive arterial vascular calcification. IMPRESSION: Soft tissue swelling anteriorly with moderate joint effusion. No fracture or dislocation evident. Total knee replacement prosthetic components appear well seated. There is multifocal arterial vascular calcification/atherosclerosis. Electronically Signed   By: Lowella Grip III M.D.   On: 07/12/2018 07:41   US Thoracentesis Asp Pleural Space W/img Guide  Result Date: 06/26/2018 INDICATION: Shortness of breath and recurrent left pleural effusion. EXAM: ULTRASOUND GUIDED LEFT THORACENTESIS MEDICATIONS: None. COMPLICATIONS: None immediate. PROCEDURE: An ultrasound guided thoracentesis was thoroughly discussed with the patient and questions answered. The benefits, risks, alternatives and complications were also discussed. The patient understands and wishes to proceed with the procedure. Written consent was obtained. Ultrasound was performed to localize and mark an adequate pocket of fluid in the left chest. The area was then prepped and  draped in the normal sterile fashion. 1% Lidocaine was used for local anesthesia. Under ultrasound guidance a 6 Fr Safe-T-Centesis catheter was introduced. Thoracentesis was performed. The catheter was removed and a dressing applied. FINDINGS: A total of approximately 500 mL of amber fluid was removed. Small amount of residual fluid at end of the procedure. No additional fluid was removed due to patient discomfort. IMPRESSION: Successful ultrasound guided left thoracentesis yielding 500 mL of pleural fluid. Electronically Signed   By: Markus Daft M.D.   On: 06/26/2018 10:03    EKG: Sinus tachycardia with left axis deviation and right bundle branch block  Weights: There were no vitals filed for this visit.   Physical Exam: Blood pressure 124/80, pulse 73, temperature 98.3 F (36.8 C), temperature source Temporal, resp. rate 15, SpO2 100 %. There is no height or weight on file to calculate BMI. General: Well developed, well nourished, in no acute distress. Head eyes ears nose throat: Normocephalic, atraumatic, sclera non-icteric, no xanthomas, nares are without discharge. No apparent thyromegaly and/or mass  Lungs: Normal respiratory effort.  no wheezes, no rales, no rhonchi.  Heart: RRR with normal S1 S2.  2+ upper sternal border murmur gallop, no rub, PMI is normal size and placement, carotid upstroke normal without bruit, jugular venous pressure is normal Abdomen: Soft, non-tender, non-distended with normoactive bowel sounds. No hepatomegaly. No rebound/guarding. No obvious abdominal masses. Abdominal aorta is normal size without bruit Extremities: Trace edema. no cyanosis, no clubbing, no ulcers  Peripheral : 2+ bilateral upper extremity pulses, 2+ bilateral femoral pulses, 2+ bilateral dorsal pedal pulse Neuro: Not alert and oriented. No facial asymmetry. No focal deficit. Moves all extremities spontaneously. Musculoskeletal: Normal muscle tone without kyphosis Psych: Does not responds to  questions appropriately with a normal affect.    Assessment: 78 year old patient with known coronary disease status post coronary bypass graft cardiomyopathy and congestive heart failure with stage V chronic kidney disease having hypotension and poor cardiac output with surgical intervention now slightly more stable without evidence of acute ST elevation myocardial infarction  Plan: 1.  Continue supportive care including pressor support and pulmonary support at this time 2.  Okay  for continuation of amiodarone until further evaluation of rhythm disturbance and because of hypotension and current event 3.  Echocardiogram for LV systolic dysfunction or changes 4.  Will ECG and enzymes to assess for possible myocardial infarction 5.  Treatment options after above  Signed, Corey Skains M.D. [facility] 06/26/2018, 7:45 PM

## 2018-07-15 NOTE — Progress Notes (Signed)
[facility] Progress Note Patient Name: Jimmy Carson DOB: 30-May-1941 MRN: [id number]   Date of Service  06/26/2018  HPI/Events of Note  Pt with intraoperative hypotension and near cardiac arrest during washout of an infected previously operated on knee. He had brief CPR although he may never have lost pulse. Now hemodynamically stable off pressors. Echo and cardiac enzymes pending.  eICU Interventions  Lines being placed by NP, continue current RX and follow up pending labs.        Kerry Kass Ogan 07/13/2018, 8:30 PM

## 2018-07-15 NOTE — Op Note (Signed)
OPERATIVE NOTE  DATE OF SURGERY:  07/08/2018  PATIENT NAME:  Jimmy Carson   DOB: June 09, 1941  MRN: [id number]   PRE-OPERATIVE DIAGNOSIS: Wound dehiscence status post right total knee revision arthroplasty  POST-OPERATIVE DIAGNOSIS:  Same  PROCEDURE: Incision, irrigation, and debridement of the right knee wound with primary closure  SURGEON:  Marciano Sequin., M.D.   ANESTHESIA: general  ESTIMATED BLOOD LOSS: Minimal  TOURNIQUET TIME: 26 minutes  DRAINS: None  INDICATIONS FOR SURGERY: [name] is a 78 y.o. year old male who previously underwent right total knee revision arthroplasty approximately 8 weeks ago.  He had a central area of wound dehiscence with some drainage. After discussion of the risks and benefits of surgical intervention, the patient expressed understanding of the risks benefits and agree with plans for vision, irrigation, debridement, and closure of the right knee wound.   PROCEDURE IN DETAIL: The patient was brought into the operating room and, after adequate general endotracheal anesthesia was achieved, a tourniquet was placed on the patient's upper right thigh.  The patient's right knee and leg were cleaned and prepped with alcohol and DuraPrep draped usual sterile fashion.  A "timeout" was performed as per usual protocol.  The right lower extremity was elevated to exsanguinate the limb, the tourniquet was inflated to 300 mmHg.  An anterior longitudinal incision made in line with the previous surgical incision.  A small ellipse was made at the 6 to 7 mm lesion.  The subcutaneous tissue was edematous with some fibrinous tissue encountered.  There was also noted to be an area along the previous medial parapatellar incision superiorly with similar changes.  The wound was debrided using 1000 mL of normal saline using the Versajet.  Excellent debridement of the tissue was noted.  The wound was then irrigated with approximately 2 L of normal saline with  antibiotic solution using pulsatile lavage.  A dilute solution of Betadine consisting of 17.5 mL of Betadine and 500 cc of normal saline was then used to irrigate and soak the wound.  Finally, an additional 1000 cc of normal saline with MI solution was used to irrigate the wound using the pulsatile lavage.  The medial parapatellar site was reinforced using interrupted sutures of #1 Prolene.  Subcutaneous tissue was approximated using interrupted sutures of #0 Monocryl.  As the final layer was being closed with #2-0 Monocryl, the patient was noted to have episodes of hypotension that were not responding to pressors.  Anesthesiology initiated a "CODE BLUE".  The skin incision at this point was reapproximated using staples and a dressing was applied.  The patient was stabilized as per anesthesiology and subsequently transferred to the CCU.   Klea Nall P. Holley Bouche M.D.

## 2018-07-15 NOTE — H&P (Signed)
The patient has been re-examined, and the chart reviewed, and there have been no interval changes to the documented history and physical.    The risks, benefits, and alternatives have been discussed at length. The patient expressed understanding of the risks benefits and agreed with plans for surgical intervention.  Jezebel Pollet P. Dixie Coppa, [name] M.D.    

## 2018-07-15 NOTE — Progress Notes (Signed)
  Amiodarone Drug - Drug Interaction Consult Note  Recommendations:  No significant drug-drug interactions identified from medications presently ordered. Begin amiodarone as ordered.  Amiodarone is metabolized by the cytochrome P450 system and therefore has the potential to cause many drug interactions. Amiodarone has an average plasma half-life of 50 days (range 20 to 100 days).   There is potential for drug interactions to occur several weeks or months after stopping treatment and the onset of drug interactions may be slow after initiating amiodarone.   []  Statins: Increased risk of myopathy. Simvastatin- restrict dose to 20mg  daily. Other statins: counsel patients to report any muscle pain or weakness immediately.  []  Anticoagulants: Amiodarone can increase anticoagulant effect. Consider warfarin dose reduction. Patients should be monitored closely and the dose of anticoagulant altered accordingly, remembering that amiodarone levels take several weeks to stabilize.  []  Antiepileptics: Amiodarone can increase plasma concentration of phenytoin, the dose should be reduced. Note that small changes in phenytoin dose can result in large changes in levels. Monitor patient and counsel on signs of toxicity.  []  Beta blockers: increased risk of bradycardia, AV block and myocardial depression. Sotalol - avoid concomitant use.  []   Calcium channel blockers (diltiazem and verapamil): increased risk of bradycardia, AV block and myocardial depression.  []   Cyclosporine: Amiodarone increases levels of cyclosporine. Reduced dose of cyclosporine is recommended.  []  Digoxin dose should be halved when amiodarone is started.  []  Diuretics: increased risk of cardiotoxicity if hypokalemia occurs.  []  Oral hypoglycemic agents (glyburide, glipizide, glimepiride): increased risk of hypoglycemia. Patient's glucose levels should be monitored closely when initiating amiodarone therapy.   []  Drugs that prolong the  QT interval:  Torsades de pointes risk may be increased with concurrent use - avoid if possible.  Monitor QTc, also keep magnesium/potassium WNL if concurrent therapy can't be avoided. Marland Kitchen Antibiotics: e.g. fluoroquinolones, erythromycin. . Antiarrhythmics: e.g. quinidine, procainamide, disopyramide, sotalol. . Antipsychotics: e.g. phenothiazines, haloperidol.  . Lithium, tricyclic antidepressants, and methadone. Thank You,  Laural Benes, PharmD, BCPS Clinical Pharmacist 07/05/2018 7:20 PM

## 2018-07-15 NOTE — Anesthesia Post-op Follow-up Note (Signed)
Anesthesia QCDR form completed.        

## 2018-07-15 NOTE — Procedures (Signed)
Central Venous Catheter Insertion Procedure Note Fleming Prill [id number] 1940-09-10  Procedure: Insertion of Central Venous Catheter Indications: Assessment of intravascular volume, Drug and/or fluid administration and Frequent blood sampling  Procedure Details Consent: Unable to obtain consent because of emergent medical necessity. Time Out: Verified patient identification, verified procedure, site/side was marked, verified correct patient position, special equipment/implants available, medications/allergies/relevent history reviewed, required imaging and test results available.  Performed  Maximum sterile technique was used including antiseptics, cap, gloves, gown, hand hygiene, mask and sheet. Skin prep: Chlorhexidine; local anesthetic administered A antimicrobial bonded/coated triple lumen catheter was placed in the right femoral vein due to multiple attempts, no other available access, patient being a dialysis patient and emergent situation using the Seldinger technique.  Evaluation Blood flow good Complications: No apparent complications Patient did tolerate procedure well. Chest X-ray ordered to verify placement.  CXR: Not applicable, placed in femoral vein.  Procedure was performed using Ultrasound for direct visualization of vessel cannulization.   Darel Hong, AGACNP-BC [facility] Pager: [phone] Cell: [place] 07/12/2018, 9:59 PM

## 2018-07-15 NOTE — Telephone Encounter (Signed)
Patient cancelled appointment for 07-17-18 with Dr. Genevive Bi through the automated reminder system.   Message left for patient to call the office if this was done in error so appointment can be rescheduled.

## 2018-07-15 NOTE — Progress Notes (Signed)
Pharmacy Antibiotic Note  Jimmy Carson is a 78 y.o. male admitted on 07/16/2018 with sepsis.  Pharmacy has been consulted for Vancomycin, Cefepime dosing.  Plan: Pt is ESRD on HD every T-Th-Sat per RN.    Cefepime 2 gm IV X 1 ordered for 1/22 @ 2300. Cefepime 2 gm IV Q T-Th-Sat after each HD session.  Vancomycin 1 gm IV X 1 given on 1/22 @ 1600. Vancomycin 750 mg IV X 1 following initial 1 gm dose to make total loading dose of 1750 mg.  Vancomycin 750 mg IV Q T-Th-Sat with each HD session. Will draw 1st Vanc trough before 3 rd HD session expected on 1/28.       Temp (24hrs), Avg:96.2 F (35.7 C), Min:94 F (34.4 C), Max:98.3 F (36.8 C)  Recent Labs  Lab 07/14/18 1056 07/16/2018 1933 07/03/2018 1936  WBC 5.4 5.8  --   CREATININE 4.54* 5.94*  --   LATICACIDVEN  --   --  5.5*    Estimated Creatinine Clearance: 10.9 mL/min (A) (by C-G formula based on SCr of 5.94 mg/dL (H)).    Allergies  Allergen Reactions  . Orange Fruit [Citrus] Hives  . Penicillins Hives and Other (See Comments)    Has patient had a PCN reaction causing immediate rash, facial/tongue/throat swelling, SOB or lightheadedness with hypotension: Yes Has patient had a PCN reaction causing severe rash involving mucus membranes or skin necrosis: No Has patient had a PCN reaction that required hospitalization No Has patient had a PCN reaction occurring within the last 10 years: No If all of the above answers are "NO", then may proceed with Cephalosporin use.     Antimicrobials this admission:  >>    >>   Dose adjustments this admission:   Microbiology results:  BCx:   UCx:    Sputum:    MRSA PCR:   Thank you for allowing pharmacy to be a part of this patient's care.  Vasiliki Smaldone D 07/16/2018 10:39 PM

## 2018-07-15 NOTE — Anesthesia Procedure Notes (Signed)
Procedure Name: LMA Insertion Date/Time: 07/20/2018 5:42 PM Performed by: Aline Brochure, CRNA Pre-anesthesia Checklist: Patient identified, Emergency Drugs available, Suction available and Patient being monitored Patient Re-evaluated:Patient Re-evaluated prior to induction Oxygen Delivery Method: Circle system utilized Preoxygenation: Pre-oxygenation with 100% oxygen Induction Type: IV induction Ventilation: Mask ventilation without difficulty LMA: LMA inserted LMA Size: 4.0 Number of attempts: 1 Placement Confirmation: positive ETCO2 and breath sounds checked- equal and bilateral Tube secured with: Tape Dental Injury: Teeth and Oropharynx as per pre-operative assessment

## 2018-07-15 NOTE — Anesthesia Procedure Notes (Signed)
Procedure Name: Intubation Date/Time: 07/17/2018 6:15 PM Performed by: Aline Brochure, CRNA Pre-anesthesia Checklist: Patient identified, Emergency Drugs available, Suction available and Patient being monitored Patient Re-evaluated:Patient Re-evaluated prior to induction Oxygen Delivery Method: Circle system utilized Preoxygenation: Pre-oxygenation with 100% oxygen Induction Type: IV induction Ventilation: Mask ventilation without difficulty Laryngoscope Size: McGraph and 4 Grade View: Grade I Tube type: Oral Tube size: 7.0 mm Number of attempts: 1 Airway Equipment and Method: Stylet and Video-laryngoscopy Placement Confirmation: positive ETCO2,  breath sounds checked- equal and bilateral and ETT inserted through vocal cords under direct vision Secured at: 21 cm Tube secured with: Tape Dental Injury: Teeth and Oropharynx as per pre-operative assessment  Difficulty Due To: Difficulty was anticipated

## 2018-07-15 NOTE — Anesthesia Postprocedure Evaluation (Signed)
Anesthesia Post Note  Patient: Jimmy Carson  Procedure(s) Performed: IRRIGATION AND DEBRIDEMENT AT CLOSURE OF RIGHT KNEE WOUND (Right Knee)  Patient location during evaluation: SICU Anesthesia Type: General Level of consciousness: sedated Pain management: pain level controlled Vital Signs Assessment: post-procedure vital signs reviewed and stable Respiratory status: patient remains intubated per anesthesia plan Cardiovascular status: unstable Postop Assessment: no apparent nausea or vomiting     Last Vitals:  Vitals:   06/27/2018 2130 06/29/2018 2135  BP: (!) 83/66 (!) 72/57  Pulse: 74 75  Resp: 18 (!) 21  Temp:    SpO2: 100% 100%    Last Pain:  Vitals:   07/21/2018 1945  TempSrc: Axillary  PainSc:                  Precious Haws Piscitello

## 2018-07-16 ENCOUNTER — Encounter: Payer: Self-pay | Admitting: Orthopedic Surgery

## 2018-07-16 ENCOUNTER — Inpatient Hospital Stay
Admission: RE | Admit: 2018-07-16 | Discharge: 2018-07-16 | Disposition: A | Discharge status: Home / Self Care | Payer: Medicare Other | Source: Home / Self Care | Attending: Pulmonary Disease | Admitting: Pulmonary Disease

## 2018-07-16 ENCOUNTER — Inpatient Hospital Stay: Payer: Medicare Other

## 2018-07-16 DIAGNOSIS — E440 Moderate protein-calorie malnutrition: Secondary | ICD-10-CM

## 2018-07-16 DIAGNOSIS — I420 Dilated cardiomyopathy: Secondary | ICD-10-CM

## 2018-07-16 DIAGNOSIS — J9601 Acute respiratory failure with hypoxia: Secondary | ICD-10-CM

## 2018-07-16 DIAGNOSIS — Z9911 Dependence on respirator [ventilator] status: Secondary | ICD-10-CM

## 2018-07-16 DIAGNOSIS — N186 End stage renal disease: Secondary | ICD-10-CM

## 2018-07-16 DIAGNOSIS — R570 Cardiogenic shock: Secondary | ICD-10-CM

## 2018-07-16 LAB — GLUCOSE, CAPILLARY
Glucose-Capillary: 147 mg/dL — ABNORMAL HIGH (ref 70–99)
Glucose-Capillary: 142 mg/dL — ABNORMAL HIGH (ref 70–99)
Glucose-Capillary: 100 mg/dL — ABNORMAL HIGH (ref 70–99)
Glucose-Capillary: 128 mg/dL — ABNORMAL HIGH (ref 70–99)
Glucose-Capillary: 121 mg/dL — ABNORMAL HIGH (ref 70–99)

## 2018-07-16 LAB — COMPREHENSIVE METABOLIC PANEL
ALT: 39 U/L (ref 0–44)
AST: 76 U/L — ABNORMAL HIGH (ref 15–41)
Albumin: 2.5 g/dL — ABNORMAL LOW (ref 3.5–5.0)
Alkaline Phosphatase: 114 U/L (ref 38–126)
Anion gap: 13 (ref 5–15)
BUN: 48 mg/dL — ABNORMAL HIGH (ref 8–23)
CO2: 28 mmol/L (ref 22–32)
Calcium: 7.3 mg/dL — ABNORMAL LOW (ref 8.9–10.3)
Chloride: 94 mmol/L — ABNORMAL LOW (ref 98–111)
Creatinine, Ser: 6.37 mg/dL — ABNORMAL HIGH (ref 0.61–1.24)
GFR calc Af Amer: 9 mL/min — ABNORMAL LOW (ref >60)
GFR, EST NON AFRICAN AMERICAN: 8 mL/min — AB (ref >60)
Glucose, Bld: 146 mg/dL — ABNORMAL HIGH (ref 70–99)
Potassium: 5.4 mmol/L — ABNORMAL HIGH (ref 3.5–5.1)
Sodium: 135 mmol/L (ref 135–145)
Total Bilirubin: 1 mg/dL (ref 0.3–1.2)
Total Protein: 6.3 g/dL — ABNORMAL LOW (ref 6.5–8.1)

## 2018-07-16 LAB — RENAL FUNCTION PANEL
ALBUMIN: 2.6 g/dL — AB (ref 3.5–5.0)
Albumin: 2.7 g/dL — ABNORMAL LOW (ref 3.5–5.0)
Anion gap: 13 (ref 5–15)
Anion gap: 10 (ref 5–15)
BUN: 47 mg/dL — ABNORMAL HIGH (ref 8–23)
BUN: 32 mg/dL — AB (ref 8–23)
CALCIUM: 7.3 mg/dL — AB (ref 8.9–10.3)
CO2: 28 mmol/L (ref 22–32)
CO2: 27 mmol/L (ref 22–32)
Calcium: 8 mg/dL — ABNORMAL LOW (ref 8.9–10.3)
Chloride: 94 mmol/L — ABNORMAL LOW (ref 98–111)
Chloride: 99 mmol/L (ref 98–111)
Creatinine, Ser: 6.77 mg/dL — ABNORMAL HIGH (ref 0.61–1.24)
Creatinine, Ser: 3.7 mg/dL — ABNORMAL HIGH (ref 0.61–1.24)
GFR calc Af Amer: 8 mL/min — ABNORMAL LOW (ref >60)
GFR calc Af Amer: 17 mL/min — ABNORMAL LOW (ref >60)
GFR calc non Af Amer: 7 mL/min — ABNORMAL LOW (ref >60)
GFR calc non Af Amer: 15 mL/min — ABNORMAL LOW (ref >60)
Glucose, Bld: 110 mg/dL — ABNORMAL HIGH (ref 70–99)
Glucose, Bld: 118 mg/dL — ABNORMAL HIGH (ref 70–99)
Phosphorus: 7.3 mg/dL — ABNORMAL HIGH (ref 2.5–4.6)
Phosphorus: 4.3 mg/dL (ref 2.5–4.6)
Potassium: 5.6 mmol/L — ABNORMAL HIGH (ref 3.5–5.1)
Potassium: 4 mmol/L (ref 3.5–5.1)
Sodium: 135 mmol/L (ref 135–145)
Sodium: 136 mmol/L (ref 135–145)

## 2018-07-16 LAB — URINALYSIS, COMPLETE (UACMP) WITH MICROSCOPIC
Bilirubin Urine: NEGATIVE
Glucose, UA: 50 mg/dL — AB
Ketones, ur: NEGATIVE mg/dL
Leukocytes, UA: NEGATIVE
Nitrite: NEGATIVE
Protein, ur: 100 mg/dL — AB
Specific Gravity, Urine: 1.01 (ref 1.005–1.030)
Squamous Epithelial / HPF: NONE SEEN (ref 0–5)
pH: 9 — ABNORMAL HIGH (ref 5.0–8.0)

## 2018-07-16 LAB — CBC
HCT: 31.9 % — ABNORMAL LOW (ref 39.0–52.0)
Hemoglobin: 9.5 g/dL — ABNORMAL LOW (ref 13.0–17.0)
MCH: 27.4 pg (ref 26.0–34.0)
MCHC: 29.8 g/dL — AB (ref 30.0–36.0)
MCV: 91.9 fL (ref 80.0–100.0)
Platelets: 167 10*3/uL (ref 150–400)
RBC: 3.47 MIL/uL — ABNORMAL LOW (ref 4.22–5.81)
RDW: 19.3 % — ABNORMAL HIGH (ref 11.5–15.5)
WBC: 6.8 10*3/uL (ref 4.0–10.5)
nRBC: 0 % (ref 0.0–0.2)

## 2018-07-16 LAB — PROCALCITONIN: Procalcitonin: 12.68 ng/mL

## 2018-07-16 LAB — TRIGLYCERIDES
Triglycerides: 60 mg/dL (ref <150)
Triglycerides: 78 mg/dL (ref <150)

## 2018-07-16 LAB — BRAIN NATRIURETIC PEPTIDE: B Natriuretic Peptide: >4500 pg/mL — ABNORMAL HIGH (ref 0.0–100.0)

## 2018-07-16 LAB — TROPONIN I
Troponin I: 0.87 ng/mL (ref <0.03)
Troponin I: 1.48 ng/mL (ref <0.03)

## 2018-07-16 LAB — MAGNESIUM
Magnesium: 2.5 mg/dL — ABNORMAL HIGH (ref 1.7–2.4)
Magnesium: 2.2 mg/dL (ref 1.7–2.4)

## 2018-07-16 LAB — FUNGUS CULTURE WITH STAIN

## 2018-07-16 LAB — FUNGUS CULTURE RESULT

## 2018-07-16 LAB — FUNGAL ORGANISM REFLEX

## 2018-07-16 MED ORDER — PROPOFOL 1000 MG/100ML IV EMUL
5.0000 ug/kg/min | INTRAVENOUS | Status: DC
  Administered 2018-07-16: 5 ug/kg/min via INTRAVENOUS
  Filled 2018-07-16: qty 100

## 2018-07-16 MED ORDER — HEPARIN SODIUM (PORCINE) 1000 UNIT/ML DIALYSIS
1000.0000 [IU] | INTRAMUSCULAR | Status: DC | PRN
  Administered 2018-07-19: 2600 [IU] via INTRAVENOUS_CENTRAL
  Filled 2018-07-16: qty 1
  Filled 2018-07-16 (×2): qty 6
  Filled 2018-07-16: qty 3

## 2018-07-16 MED ORDER — VANCOMYCIN HCL IN DEXTROSE 1-5 GM/200ML-% IV SOLN
1000.0000 mg | INTRAVENOUS | Status: DC
  Administered 2018-07-16: 1000 mg via INTRAVENOUS
  Filled 2018-07-16 (×2): qty 200

## 2018-07-16 MED ORDER — HEPARIN SODIUM (PORCINE) 5000 UNIT/ML IJ SOLN
5000.0000 [IU] | Freq: Three times a day (TID) | INTRAMUSCULAR | Status: DC
  Administered 2018-07-16 – 2018-07-19 (×8): 5000 [IU] via SUBCUTANEOUS
  Filled 2018-07-16 (×8): qty 1

## 2018-07-16 MED ORDER — PROPOFOL 1000 MG/100ML IV EMUL
5.0000 ug/kg/min | INTRAVENOUS | Status: DC
  Administered 2018-07-16: 20 ug/kg/min via INTRAVENOUS
  Administered 2018-07-16: 25 ug/kg/min via INTRAVENOUS
  Administered 2018-07-17 (×2): 35 ug/kg/min via INTRAVENOUS
  Administered 2018-07-17: 17 ug/kg/min via INTRAVENOUS
  Administered 2018-07-17 – 2018-07-18 (×3): 35 ug/kg/min via INTRAVENOUS
  Filled 2018-07-16 (×6): qty 100

## 2018-07-16 MED ORDER — CHLORHEXIDINE GLUCONATE 0.12% ORAL RINSE (MEDLINE KIT)
15.0000 mL | Freq: Two times a day (BID) | OROMUCOSAL | Status: DC
  Administered 2018-07-16 – 2018-07-22 (×11): 15 mL via OROMUCOSAL

## 2018-07-16 MED ORDER — SODIUM CHLORIDE 0.9 % IV SOLN
2.0000 g | Freq: Three times a day (TID) | INTRAVENOUS | Status: DC
  Administered 2018-07-16 – 2018-07-20 (×13): 2 g via INTRAVENOUS
  Filled 2018-07-16 (×16): qty 2

## 2018-07-16 MED ORDER — MIDAZOLAM HCL 2 MG/2ML IJ SOLN
2.0000 mg | Freq: Once | INTRAMUSCULAR | Status: AC
  Administered 2018-07-16: 2 mg via INTRAVENOUS

## 2018-07-16 MED ORDER — FAMOTIDINE IN NACL 20-0.9 MG/50ML-% IV SOLN: 20.0000 mg | INTRAVENOUS | Status: DC

## 2018-07-16 MED ORDER — PUREFLOW DIALYSIS SOLUTION
INTRAVENOUS | Status: DC
  Administered 2018-07-16 – 2018-07-17 (×2): via INTRAVENOUS_CENTRAL
  Administered 2018-07-17: 3 via INTRAVENOUS_CENTRAL
  Administered 2018-07-17: 07:00:00 via INTRAVENOUS_CENTRAL
  Administered 2018-07-18: 3 via INTRAVENOUS_CENTRAL
  Administered 2018-07-18 (×2): via INTRAVENOUS_CENTRAL

## 2018-07-16 MED ORDER — ORAL CARE MOUTH RINSE
15.0000 mL | OROMUCOSAL | Status: DC
  Administered 2018-07-16 – 2018-07-22 (×56): 15 mL via OROMUCOSAL

## 2018-07-16 NOTE — Progress Notes (Signed)
*  PRELIMINARY RESULTS* Echocardiogram 2D Echocardiogram has been performed.  Jimmy Carson 07/16/2018, 9:49 AM

## 2018-07-16 NOTE — Consult Note (Signed)
PULMONARY / CRITICAL CARE MEDICINE   Name: Jimmy Carson MRN: [id number] DOB: Apr 17, 1941    ADMISSION DATE:  07/10/2018 CONSULTATION DATE:  07/23/2018  REFERRING MD:  Dr. Marry Guan  CHIEF COMPLAINT:  Severe Hypotension  BRIEF DISCUSSION: 78 y.o. Male who was undergoing elective I&D of Right knee wound, as he was being closed he had episodes of hypotension that did not respond to pressors.  CODE BLUE was called by Anesthesia, pt did not lose pulse, but he did receive approximately 4 minutes of CPR to help with perfusion and 2 doses of Epinephrine.  He returns to ICU post code, intubated.  He is admitted for treatment of Cardiogenic shock +/- Septic shock.   SUBJECTIVE:  Intubated, sedated. Agitated on WUA  VITAL SIGNS: BP 111/61 (BP Location: Right Arm)   Pulse 64   Temp 97.7 F (36.5 C) (Rectal)   Resp 18   Ht 5\' 10"  (1.778 m)   Wt 75.6 kg   SpO2 97%   BMI 23.91 kg/m   HEMODYNAMICS:    VENTILATOR SETTINGS: Vent Mode: PRVC FiO2 (%):  [40 %-100 %] 40 % Set Rate:  [18 bmp-20 bmp] 18 bmp Vt Set:  [500 mL] 500 mL PEEP:  [5 cmH20-8 cmH20] 5 cmH20 Plateau Pressure:  [17 cmH20-19 cmH20] 17 cmH20  INTAKE / OUTPUT: I/O last 3 completed shifts: In: 1094.7 [I.V.:851.5; IV Piggyback:243.2] Out: 15 [Urine:10; Blood:5]  PHYSICAL EXAMINATION: General: RASS -3, not F/C, intubated, sedated Neuro:  CNs intact, MAEs HEENT: NCAT, sclerae white Cardiovascular: IR, Split S2, soft blowing systolic M, LBBB pattern on EKG Lungs: no wheezes anteriorly Abdomen: hypoactive BS, soft, NT Ext: cool, 2+ R>L pretibial edema, R knee dressing, RUE AVF with thrill Skin: no lesions noted  LABS:  BMET Recent Labs  Lab 06/28/2018 1933 07/16/18 0448 07/16/18 1217  NA 136 135 135  K 4.4 5.4* 5.6*  CL 95* 94* 94*  CO2 23 28 28   BUN 40* 48* 47*  CREATININE 5.94* 6.37* 6.77*  GLUCOSE 106* 146* 110*    Electrolytes Recent Labs  Lab 07/18/2018 1933 07/16/18 0448 07/16/18 1217  CALCIUM  7.5* 7.3* 7.3*  MG  --   --  2.5*  PHOS  --   --  7.3*    CBC Recent Labs  Lab 07/14/18 1056 06/27/2018 1543 07/05/2018 1933 07/16/18 0536  WBC 5.4  --  5.8 6.8  HGB 8.3* 10.2* 9.9* 9.5*  HCT 28.3* 30.0* 35.3* 31.9*  PLT 143*  --  161 167    Coag's Recent Labs  Lab 07/14/18 1056  INR 1.21    Sepsis Markers Recent Labs  Lab 07/14/2018 1933 07/24/2018 1936 07/10/2018 2203 07/16/18 0448  LATICACIDVEN  --  5.5* 3.1*  --   PROCALCITON 0.46  --   --  12.68    ABG Recent Labs  Lab 07/10/2018 1930 07/16/18 0500  PHART 7.29* 7.48*  PCO2ART 44 37  PO2ART 302* 177*    Liver Enzymes Recent Labs  Lab 07/14/2018 1933 07/16/18 0448 07/16/18 1217  AST 43* 76*  --   ALT 19 39  --   ALKPHOS 89 114  --   BILITOT 0.9 1.0  --   ALBUMIN 2.7* 2.5* 2.6*    Cardiac Enzymes Recent Labs  Lab 07/21/2018 1933 07/16/18 0118 07/16/18 1217  TROPONINI 0.03* 0.87* 1.48*    Glucose Recent Labs  Lab 07/07/2018 1918 07/05/2018 2347 07/16/18 0333 07/16/18 0738 07/16/18 1213  GLUCAP 70 113* 147* 142* 100*    CXR: chronic  L pleural disease, LLL atx, enlarged PAs, no frank edema    STUDIES:  Echocardiogram 1/23>> Venous Ultrasound bilateral LE 1/23>> No evidence of deep venous thrombosis. Limited evaluation of left saphenofemoral junction due to groin bandage  CULTURES: Wound R Knee 1/22>> Blood x2 1/22>> Urine 1/22>> Sputum 1/22>>  ANTIBIOTICS: Clindamycin 1/22>> 1/23 Vancomycin 1/22>> Cefepime 1/22>>  SIGNIFICANT EVENTS: 1/22>> Elective I&D of Right knee 1/22>> Hypotension at end of procedure, required transfer to ICU  LINES/TUBES: ETT 1/22>> Right Femoral CVC 1/22>> L femoral HD cath  ASSESSMENT / PLAN:  PULMONARY A: VDRF post R knee surgery c/b brief cardiac arrest Chronic L pleural disease - no indication for thoracentesis P:   Cont full vent support - settings reviewed and/or adjusted Cont vent bundle Daily SBT if/when meets criteria  CARDIOVASCULAR A:   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 New LBBB pattern on EKG S/P bradycardic arrest in OR Cardiogenic Shock +/- Septic Shock Acute on Chronic Systolic CHF  Mildly elevated Trop I, likely demand ischemia P:  Cardiology following Echocardiogram results pending Wean dopamine to off as permitted by HR Wean NE to off as tolerated for MAP > 60 mmHg  RENAL A:   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  Hypotension prohibits intermittent HD Hyperkalemia P:  Monitor BMET intermittently Monitor I/Os Correct electrolytes as indicated Nephrology following CRRT to begin today  GASTROINTESTINAL A:   No acute issues P:   SUP: IV famotidine Holding TFs for now  HEMATOLOGIC A:   Anemia of chronic disease, no overt bleeding P:  DVT px: SCDs Monitor CBC intermittently Transfuse per usual guidelines  INFECTIOUS A:   SIRS, possible severe sepsis P:   Monitor temp, WBC count Micro and abx as above  ENDOCRINE A:   Steriod - induced hyperglycemia   P:   Cont SSI protocol  NEUROLOGIC A:   Acute Metabolic Encephalopathy Vent/ICU/post op discomfort P:   RASS goal: -1, -2 PAD protocol, fentanyl infusion Daily WUA   FAMILY  Wife updated in detail @ bedside  CCM time: 40 mins The above time includes time spent in consultation with patient and/or family members and reviewing care plan on multidisciplinary rounds  Merton Border, MD PCCM service Mobile [phone] Pager [phone]  07/16/2018, 3:28 PM

## 2018-07-16 NOTE — Procedures (Signed)
Hemodialysis Catheter Insertion Procedure Note Dewayne Severe [id number] 1940/11/10  Procedure: Insertion of Hemodialysis Catheter Indications: Dialysis Access   Procedure Details Consent: Risks of procedure as well as the alternatives and risks of each were explained to the (patient/caregiver).  Consent for procedure obtained. Time Out: Verified patient identification, verified procedure, site/side was marked, verified correct patient position, special equipment/implants available, medications/allergies/relevent history reviewed, required imaging and test results available.  Performed  Maximum sterile technique was used including antiseptics, cap, gloves, gown, hand hygiene, mask and sheet. Skin prep: Chlorhexidine; local anesthetic administered Double lumen hemodialysis catheter was inserted into left femoral vein due to patient being a dialysis patient using the Seldinger technique.  Evaluation Blood flow good Complications: No apparent complications Patient did tolerate procedure well. Chest X-ray ordered to verify placement.  CXR: not indicated.  Left femoral temporary dual lumen dialysis catheter placed utilizing ultrasound no complications noted during or following procedure   Marda Stalker, [place] Pager [phone] (please enter 7 digits) PCCM Consult Pager [phone] (please enter 7 digits)

## 2018-07-16 NOTE — Progress Notes (Signed)
[facility]  ROUNDING NOTE   Subjective:   [name] had wound dehiscence. Patient had cardiac arrest and now has been placed in critical condition. Has been intubated. Currently on dopamine, vasopressin and norepinephrine.   Family at bedside.   Objective:  Vital signs in last 24 hours:  Temp:  [94 F (34.4 C)-99.3 F (37.4 C)] 99 F (37.2 C) (01/23 0800) Pulse Rate:  [64-136] 85 (01/23 0900) Resp:  [0-25] 21 (01/23 0900) BP: (69-143)/(43-94) 109/62 (01/23 0900) SpO2:  [80 %-100 %] 98 % (01/23 0900) FiO2 (%):  [40 %-100 %] 40 % (01/23 0800) Weight:  [75.6 kg] 75.6 kg (01/23 0800)  Weight change:  Filed Weights   07/16/18 0800  Weight: 75.6 kg    Intake/Output: I/O last 3 completed shifts: In: 1094.7 [I.V.:851.5; IV Piggyback:243.2] Out: 15 [Urine:10; Blood:5]   Intake/Output this shift:  Total I/O In: 31.7 [I.V.:31.7] Out: 0   Physical Exam: General: Critically ill  Head: ETT  Eyes:  PERRL  Neck:  trachea midline  Lungs:  PRVC FiO2 40%  Heart: Regular rate and rhythm  Abdomen:  Soft   Extremities:  no peripheral edema.  Neurologic: Intubated and sedated  Skin: No lesions  Access: Left AVF    Basic Metabolic Panel: Recent Labs  Lab 07/14/18 1056 07/24/2018 1543 07/07/2018 1933 07/16/18 0448  NA 137 134* 136 135  K 3.9 4.8 4.4 5.4*  CL 92*  --  95* 94*  CO2 34*  --  23 28  GLUCOSE 86 82 106* 146*  BUN 30*  --  40* 48*  CREATININE 4.54*  --  5.94* 6.37*  CALCIUM 7.7*  --  7.5* 7.3*    Liver Function Tests: Recent Labs  Lab 07/16/2018 1933 07/16/18 0448  AST 43* 76*  ALT 19 39  ALKPHOS 89 114  BILITOT 0.9 1.0  PROT 5.8* 6.3*  ALBUMIN 2.7* 2.5*   No results for input(s): LIPASE, AMYLASE in the last 168 hours. No results for input(s): AMMONIA in the last 168 hours.  CBC: Recent Labs  Lab 07/14/18 1056 07/14/2018 1543 07/04/2018 1933 07/16/18 0536  WBC 5.4  --  5.8 6.8  NEUTROABS  --   --  3.7  --   HGB 8.3*  10.2* 9.9* 9.5*  HCT 28.3* 30.0* 35.3* 31.9*  MCV 94.6  --  99.2 91.9  PLT 143*  --  161 167    Cardiac Enzymes: Recent Labs  Lab 07/03/2018 1933 07/16/18 0118  TROPONINI 0.03* 0.87*    BNP: Invalid input(s): POCBNP  CBG: Recent Labs  Lab 07/08/2018 1918 07/14/2018 2347 07/16/18 0333 07/16/18 0738  GLUCAP 70 113* 147* 142*    Microbiology: Results for orders placed or performed during the hospital encounter of 07/04/2018  Aerobic/Anaerobic Culture (surgical/deep wound)     Status: None (Preliminary result)   Collection Time: 06/26/2018  6:20 PM  Result Value Ref Range Status   Specimen Description   Final    WOUND RIGHT KNEE Performed at [facility] [address] [place], [place] [address]    Special Requests   Final    NONE Performed at [facility], [address] [place], [place] [address]    Gram Stain   Final    FEW WBC PRESENT, PREDOMINANTLY PMN NO ORGANISMS SEEN Performed at [facility], [place] [address], [place], [place] [address]    Culture PENDING  Incomplete   Report Status PENDING  Incomplete  CULTURE, BLOOD (  ROUTINE X 2) w Reflex to ID Panel     Status: None (Preliminary result)   Collection Time: 07/20/2018 10:03 PM  Result Value Ref Range Status   Specimen Description BLOOD RIGHT CENTRAL LINE   Final   Special Requests   Final    BOTTLES DRAWN AEROBIC AND ANAEROBIC Blood Culture results may not be optimal due to an excessive volume of blood received in culture bottles   Culture   Final    NO GROWTH < 12 HOURS Performed at [facility], [address] [place], [place] [address]    Report Status PENDING  Incomplete  CULTURE, BLOOD (ROUTINE X 2) w Reflex to ID Panel     Status: None (Preliminary result)   Collection Time: 06/29/2018 10:32 PM  Result Value Ref Range Status   Specimen Description BLOOD BLOOD RIGHT HAND  Final   Special Requests   Final    BOTTLES DRAWN AEROBIC AND ANAEROBIC Blood Culture adequate volume    Culture   Final    NO GROWTH < 12 HOURS Performed at [facility], [address] [place], [place] [address]    Report Status PENDING  Incomplete    Coagulation Studies: Recent Labs    07/14/18 1056  LABPROT 15.2  INR 1.21    Urinalysis: Recent Labs    07/14/2018 2203  COLORURINE YELLOW*  LABSPEC 1.010  PHURINE 9.0*  GLUCOSEU 50*  HGBUR LARGE*  BILIRUBINUR NEGATIVE  KETONESUR NEGATIVE  PROTEINUR 100*  NITRITE NEGATIVE  LEUKOCYTESUR NEGATIVE      Imaging: US Venous Img Lower Bilateral  Result Date: 07/16/2018 CLINICAL DATA:  Bilateral leg swelling EXAM: BILATERAL LOWER EXTREMITY VENOUS DOPPLER ULTRASOUND TECHNIQUE: Gray-scale sonography with graded compression, as well as color Doppler and duplex ultrasound were performed to evaluate the lower extremity deep venous systems from the level of the common femoral vein and including the common femoral, femoral, profunda femoral, popliteal and calf veins including the posterior tibial, peroneal and gastrocnemius veins when visible. The superficial great saphenous vein was also interrogated. Spectral Doppler was utilized to evaluate flow at rest and with distal augmentation maneuvers in the common femoral, femoral and popliteal veins. COMPARISON:  06/05/2018 FINDINGS: RIGHT LOWER EXTREMITY Common Femoral Vein: No evidence of thrombus. Normal compressibility, respiratory phasicity and response to augmentation. Linear echogenicity in the right common femoral vein presumably representing central line. Saphenofemoral Junction: No evidence of thrombus. Normal compressibility and flow on color Doppler imaging. Profunda Femoral Vein: No evidence of thrombus. Normal compressibility and flow on color Doppler imaging. Femoral Vein: No evidence of thrombus. Normal compressibility, respiratory phasicity and response to augmentation. Popliteal Vein: No evidence of thrombus. Normal compressibility, respiratory phasicity and response to  augmentation. Calf Veins: No evidence of thrombus. Normal compressibility and flow on color Doppler imaging. LEFT LOWER EXTREMITY Common Femoral Vein: No evidence of thrombus. Normal compressibility, respiratory phasicity and response to augmentation. Saphenofemoral Junction: Unable to be evaluated secondary to groin bandage. Profunda Femoral Vein: No evidence of thrombus. Normal compressibility and flow on color Doppler imaging. Femoral Vein: No evidence of thrombus. Normal compressibility, respiratory phasicity and response to augmentation. Popliteal Vein: No evidence of thrombus. Normal compressibility, respiratory phasicity and response to augmentation. Calf Veins: No evidence of thrombus. Normal compressibility and flow on color Doppler imaging. Other Findings:  None. IMPRESSION: No evidence of deep venous thrombosis. Limited evaluation of left saphenofemoral junction due to groin bandage. Electronically Signed   By: Donavan Foil M.D.   On: 07/16/2018 01:15  Portable Chest Xray  Result Date: 07/16/2018 CLINICAL DATA:  Respiratory failure EXAM: PORTABLE CHEST 1 VIEW COMPARISON:  Yesterday FINDINGS: Endotracheal tube tip at the clavicular heads. The orogastric tube at least reaches the stomach. Cardiomegaly with CABG and transcatheter aortic valve replacement. Small left pleural effusion. Right hilar enlargement that is vascular based on December 2019 chest CT. IMPRESSION: 1. Unremarkable hardware positioning. 2. Stable cardiomegaly and small left effusion. Electronically Signed   By: Monte Fantasia M.D.   On: 07/16/2018 06:40   Portable Chest X-ray  Result Date: 07/10/2018 CLINICAL DATA:  78 year old male status post intubation. EXAM: PORTABLE CHEST 1 VIEW COMPARISON:  Chest radiograph dated 06/27/2018 FINDINGS: An endotracheal tube is noted with tip approximately 5 cm above the carina. An enteric tube extends into the left upper abdomen with tip and side-port the region of the body of the stomach.  Left-sided pleural effusion and left lung base atelectasis or infiltrate. Overall slight the improved aeration of the left lung compared to the prior radiograph. There is cardiomegaly with bilateral hilar vascular prominence and congestion. No pneumothorax. Median sternotomy wires, CABG vascular clips, and aortic valve repair. No bowel dilatation in the visualized abdomen. Osteopenia with scoliosis and degenerative changes of the spine. IMPRESSION: 1. Endotracheal tube above the carina and enteric tube within the stomach. 2. Cardiomegaly with vascular congestion. 3. Slight interval improvement in aeration of the left lung and slight decrease in left-sided pleural effusion and left lung base atelectasis or infiltrate. Electronically Signed   By: Anner Crete M.D.   On: 07/14/2018 20:50   Dg Abd Portable 1v  Result Date: 07/06/2018 CLINICAL DATA:  79 year old male status post intubation. EXAM: PORTABLE CHEST 1 VIEW COMPARISON:  Chest radiograph dated 06/27/2018 FINDINGS: An endotracheal tube is noted with tip approximately 5 cm above the carina. An enteric tube extends into the left upper abdomen with tip and side-port the region of the body of the stomach. Left-sided pleural effusion and left lung base atelectasis or infiltrate. Overall slight the improved aeration of the left lung compared to the prior radiograph. There is cardiomegaly with bilateral hilar vascular prominence and congestion. No pneumothorax. Median sternotomy wires, CABG vascular clips, and aortic valve repair. No bowel dilatation in the visualized abdomen. Osteopenia with scoliosis and degenerative changes of the spine. IMPRESSION: 1. Endotracheal tube above the carina and enteric tube within the stomach. 2. Cardiomegaly with vascular congestion. 3. Slight interval improvement in aeration of the left lung and slight decrease in left-sided pleural effusion and left lung base atelectasis or infiltrate. Electronically Signed   By: Anner Crete M.D.   On: 07/21/2018 20:50     Medications:   . ceFEPime (MAXIPIME) IV    . DOPamine 3 mcg/kg/min (07/16/18 0800)  . [START ON 07/17/2018] famotidine (PEPCID) IV    . fentaNYL infusion INTRAVENOUS 200 mcg/hr (07/16/18 0905)  . norepinephrine (LEVOPHED) Adult infusion 8 mcg/min (07/16/18 0800)  . vancomycin     . chlorhexidine gluconate (MEDLINE KIT)  15 mL Mouth Rinse BID  . hydrocortisone sod succinate (SOLU-CORTEF) inj  50 mg Intravenous Q6H  . insulin aspart  0-15 Units Subcutaneous Q4H  . mouth rinse  15 mL Mouth Rinse 10 times per day   fentaNYL, ipratropium-albuterol, midazolam, midazolam  Assessment/ Plan:  [name] is a 78 y.o. white male with ESRD, hypothyroidism, hypertension, BPH, CAD status post CABG, hyperlipidemia, right total knee revision arthroplasty May 22, 2018 Dr. Marry Guan. Wound dehiscence on 1/22. Cardiac arrest post procedure.  [address] neph[facility]/MWF  1. End-stage renal disease: with hyperkalemia hemodynamically unstable for intermittent hemodialysis. Needs renal replacement today.  - Recommend temp HD catheter placed and initiate CRRT.   2. Cardiogenic shock with acute on chronic systolic congestive heart failure Respiratory failure requiring mechanical ventilation.  - Requiring vasopressors.   3. Lactic acidosis - empiric vanco and cefepime.   4. Anemia of chronic kidney disease: hemoglobin 9.5   LOS: 1 Danna Sewell 1/23/202010:40 AM

## 2018-07-16 NOTE — Progress Notes (Signed)
Pt's family is concerned that pt is not currently on VTE Prophylaxis.  Called and discussed with Dr. Marry Guan to be sure it is ok from a surgical standpoint to resume VTE Prophylaxis.  Per Dr. Marry Guan, may place pt on VTE prophylaxis.  Will place pt on SQ Heparin given his ESRD.   Darel Hong, AGACNP-BC [facility] Pager: [phone] Cell: [phone]

## 2018-07-16 NOTE — Progress Notes (Signed)
Pharmacy CRRT Monitoring Consult:  Pharmacy consulted to assist in monitoring and adjusting medications while on CRRT in this 78 y.o. male admitted on 07/02/2018 with cardiogenic and possible septic shock s/p elective I&D of right knee wound.   Assessment/Plan: Antibiotics have been adjusted. Famotidine transitioned to Q24hr dosing. No further adjustments warranted.   Pharmacy will continue to monitor and adjust per consult.   Simpson,Michael L 07/16/2018 2:57 PM

## 2018-07-16 NOTE — Progress Notes (Signed)
Initial Nutrition Assessment  DOCUMENTATION CODES:   Non-severe (moderate) malnutrition in context of chronic illness  INTERVENTION:  If patient remains intubated >24-48 hours, recommend initiating Nepro at 35 mL/hr (840 mL goal daily volume) + Pro-Stat 30 mL BID per tube. Provides 1712 kcal, 98 grams of protein, 613 mL H2O daily.  If tube feeds are initiated provide minimum free water flush of 20-30 mL Q4hrs to maintain tube patency.  Recommend B-complex with C QHS per tube.  Recommend vitamin C 250 mg BID.  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  GOAL:   Provide needs based on ASPEN/SCCM guidelines  MONITOR:   Vent status, Labs, Weight trends, I & O's, Skin  REASON FOR ASSESSMENT:   Ventilator    ASSESSMENT:   78 year old male with PMHx of ESRD on HD, anemia, HLD, CAD, HTN, OA, shingles, hyperparathyroidism, hx prostate cancer s/p XRT, hx MI, hx CABG who was undergoing elective I&D of right knee wound, developed hypotension not responsive to pressors, underwent 4 minutes of CPR, and was admitted to ICU intubated with cardiogenic shock +/- septic shock.   Patient intubated and sedated. On PRVC mode with FiO2 40% and PEEP 5 cmH2O. Abdomen soft. Last BM unknown/PTA. Weight history in chart does not look accurate as patient has been "measured" at the exact same weight for 3 visits now.  Enteral Access: 16 Fr. OGT placed 1/22; terminates in stomach per chest x-ray 1/23; 61 cm at corner of mouth  MAP: 69-88 mmHg  Patient is currently intubated on ventilator support Ve: 8.6 L/min Temp (24hrs), Avg:97.7 F (36.5 C), Min:94 F (34.4 C), Max:99.3 F (37.4 C)  Propofol: N/A  Medications reviewed and include: Solu-Cortef 50 mg Q6hrs IV, Novolog 0-15 units Q4hrs, cefepime, dopamine gtt now off, fentanyl gtt, famotidine, norepinephrine gtt at 12 mcg/min, vasopressin  gtt off, vancomycin.  Labs reviewed: CBG 100-142, Potassium 5.4, Chloride 94, BUN 48, Creatinine 6.37, Phosphorus 7.3, BNP >4500.  I/O: 10 mL UOP yesterday  Discussed with RN. Plan is to hold off on initiation of tube feeds today.  NUTRITION - FOCUSED PHYSICAL EXAM:    Most Recent Value  Orbital Region  Moderate depletion  Upper Arm Region  Moderate depletion  Thoracic and Lumbar Region  Moderate depletion  Buccal Region  Unable to assess  Temple Region  Severe depletion  Clavicle Bone Region  Moderate depletion  Clavicle and Acromion Bone Region  Severe depletion  Scapular Bone Region  Unable to assess  Dorsal Hand  Moderate depletion  Patellar Region  Moderate depletion  Anterior Thigh Region  Moderate depletion  Posterior Calf Region  Moderate depletion  Edema (RD Assessment)  Mild  Hair  Reviewed  Eyes  Unable to assess  Mouth  Unable to assess  Skin  Reviewed  Nails  Reviewed     Diet Order:   Diet Order            Diet NPO time specified  Diet effective now             EDUCATION NEEDS:   Not appropriate for education at this time  Skin:  Skin Assessment: Skin Integrity Issues:(closed incision to right knee; ecchymosis)  Last BM:  Unknown/PTA  Height:   Ht Readings from Last 1 Encounters:  07/16/18 5\' 10"  (1.778 m)   Weight:   Wt Readings from Last 1 Encounters:  07/16/18 75.6 kg   Ideal  Body Weight:  75.5 kg  BMI:  Body mass index is 23.91 kg/m.  Estimated Nutritional Needs:   Kcal:  1738 (PSU 2003b w/ MSJ 1498, Ve 8.6, Tmax 37.4)  Protein:  90-115 grams (1.2-1.5 grams/kg)  Fluid:  UOP + 1 L  Willey Blade, MS, RD, LDN Office: [phone] Pager: [phone] After Hours/Weekend Pager: [phone]

## 2018-07-16 NOTE — Progress Notes (Signed)
ORTHOPAEDICS PROGRESS NOTE  PATIENT NAME: Jimmy Carson DOB: August 25, 1940  MRN: [id number]  POD # 1: s/p I&D right knee  Subjective: Intubated and sedated. Still on pressors.  Objective: Vital signs in last 24 hours: Temp:  [94 F (34.4 C)-99.3 F (37.4 C)] 99.3 F (37.4 C) (01/23 0445) Pulse Rate:  [64-136] 117 (01/23 0700) Resp:  [0-25] 18 (01/23 0700) BP: (69-143)/(43-94) 115/73 (01/23 0700) SpO2:  [80 %-100 %] 99 % (01/23 0722) FiO2 (%):  [40 %-100 %] 40 % (01/23 0722)  Intake/Output from previous day: 01/22 0701 - 01/23 0700 In: 1094.7 [I.V.:851.5; IV Piggyback:243.2] Out: 15 [Urine:10; Blood:5]  Recent Labs    07/14/18 1056 06/27/2018 1543 07/07/2018 1933 07/16/18 0448 07/16/18 0536  WBC 5.4  --  5.8  --  6.8  HGB 8.3* 10.2* 9.9*  --  9.5*  HCT 28.3* 30.0* 35.3*  --  31.9*  PLT 143*  --  161  --  167  K 3.9 4.8 4.4 5.4*  --   CL 92*  --  95* 94*  --   CO2 34*  --  23 28  --   BUN 30*  --  40* 48*  --   CREATININE 4.54*  --  5.94* 6.37*  --   GLUCOSE 86 82 106* 146*  --   CALCIUM 7.7*  --  7.5* 7.3*  --   INR 1.21  --   --   --   --     EXAM General: Sedated. Lungs: Decreased breath sounds to left base Cardiac: normal rate, regular rhythm, normal S1, S2, no murmurs, rubs, clicks or gallops, normal rate and regular rhythm Right extremity: Mild swelling to knee. No erythema. Dressing dry and intact.  Assessment: S/p I&D of right knee  Plan: Appreciate intensivist and cardiologist assisatance. Probable dialysis today. Cx pending.  Alphonsine Minium P. Holley Bouche M.D.

## 2018-07-16 NOTE — Progress Notes (Signed)
[facility] Encounter Note  Patient: Jimmy Carson / Admit Date: 06/29/2018 / Date of Encounter: 07/16/2018, 9:22 AM   Subjective: Patient more hemodynamically stable today.  Heart rate controlled with no evidence of new EKG changes.  Troponin level minimally elevated most consistent with demand ischemia.  BNP extremely high consistent with poor cardiac output heart failure during anesthesia and surgical intervention.  Patient neurologically slightly agitated this morning  Review of Systems: Cannot assess due to sedation Objective: Telemetry: Sinus tachycardia with bundle branch block Physical Exam: Blood pressure 115/73, pulse (!) 117, temperature 99.3 F (37.4 C), temperature source Rectal, resp. rate 18, SpO2 99 %. There is no height or weight on file to calculate BMI. General: Well developed, well nourished, on the ventilator Head: Normocephalic, atraumatic, sclera non-icteric, no xanthomas, nares are without discharge. Neck: No apparent masses Lungs: Normal respirations with no wheezes, no rhonchi, no rales , no crackles   Heart: Regular rate and rhythm, normal S1 S2, right upper sternal border murmur, no rub, no gallop, PMI is normal size and placement, carotid upstroke normal without bruit, jugular venous pressure normal Abdomen: Soft, non-tender, non-distended with normoactive bowel sounds. No hepatosplenomegaly. Abdominal aorta is normal size without bruit Extremities: Trace edema, no clubbing, no cyanosis, no ulcers,  Peripheral: 2+ radial, 2+ femoral, 2+ dorsal pedal pulses Neuro: Not alert and oriented. Moves all extremities spontaneously. Psych: Does not responds to questions appropriately with a normal affect.   Intake/Output Summary (Last 24 hours) at 07/16/2018 1610 Last data filed at 07/16/2018 0700 Gross per 24 hour  Intake 1094.68 ml  Output 15 ml  Net 1079.68 ml    Inpatient Medications:  . fentaNYL (SUBLIMAZE) injection  50 mcg  Intravenous Once  . hydrocortisone sod succinate (SOLU-CORTEF) inj  50 mg Intravenous Q6H  . insulin aspart  0-15 Units Subcutaneous Q4H   Infusions:  . ceFEPime (MAXIPIME) IV    . clindamycin (CLEOCIN) IV 600 mg (07/16/18 0908)  . DOPamine 3 mcg/kg/min (07/16/18 0700)  . famotidine (PEPCID) IV 20 mg (07/16/18 0831)  . fentaNYL infusion INTRAVENOUS 200 mcg/hr (07/16/18 0905)  . norepinephrine (LEVOPHED) Adult infusion 8 mcg/min (07/16/18 0700)  . vancomycin    . vasopressin (PITRESSIN) infusion - *FOR SHOCK* Stopped (07/16/18 9604)    Labs: Recent Labs    07/20/2018 1933 07/16/18 0448  NA 136 135  K 4.4 5.4*  CL 95* 94*  CO2 23 28  GLUCOSE 106* 146*  BUN 40* 48*  CREATININE 5.94* 6.37*  CALCIUM 7.5* 7.3*   Recent Labs    07/04/2018 1933 07/16/18 0448  AST 43* 76*  ALT 19 39  ALKPHOS 89 114  BILITOT 0.9 1.0  PROT 5.8* 6.3*  ALBUMIN 2.7* 2.5*   Recent Labs    06/29/2018 1933 07/16/18 0536  WBC 5.8 6.8  NEUTROABS 3.7  --   HGB 9.9* 9.5*  HCT 35.3* 31.9*  MCV 99.2 91.9  PLT 161 167   Recent Labs    07/19/2018 1933 07/16/18 0118  TROPONINI 0.03* 0.87*   Invalid input(s): POCBNP No results for input(s): HGBA1C in the last 72 hours.   Weights: There were no vitals filed for this visit.   Radiology/Studies:  Dg Chest 2 View  Result Date: 06/27/2018 CLINICAL DATA:  Left-sided pleural effusion. Follow-up. EXAM: CHEST - 2 VIEW COMPARISON:  06/26/2018 FINDINGS: Previous median sternotomy and CABG. Previous aortic valve replacement. Right chest remains clear. Persistent left effusion with atelectasis and or infiltrate in the left lower lung.  Findings may be slightly worsened on the left. IMPRESSION: Persistent left effusion with atelectasis and or infiltrate in the left lower lung. Findings may be slightly worsened on the left. Electronically Signed   By: Nelson Chimes M.D.   On: 06/27/2018 08:15   Dg Chest 2 View  Result Date: 06/25/2018 CLINICAL DATA:  Shortness of  breath EXAM: CHEST - 2 VIEW COMPARISON:  06/11/2018 FINDINGS: Post sternotomy changes with valve prosthesis. Small left-sided pleural effusion, slightly increased compared to prior. Trace right pleural effusion. Cardiomegaly with vascular congestion. Patchy airspace disease at the left base. Aortic atherosclerosis. No pneumothorax. IMPRESSION: 1. Small left-sided pleural effusion, slight increase as compared to radiograph 06/11/2018. Trace right pleural effusion 2. Cardiomegaly with vascular congestion. Patchy airspace disease at the left lung base. Electronically Signed   By: Donavan Foil M.D.   On: 06/25/2018 18:50   US Venous Img Lower Bilateral  Result Date: 07/16/2018 CLINICAL DATA:  Bilateral leg swelling EXAM: BILATERAL LOWER EXTREMITY VENOUS DOPPLER ULTRASOUND TECHNIQUE: Gray-scale sonography with graded compression, as well as color Doppler and duplex ultrasound were performed to evaluate the lower extremity deep venous systems from the level of the common femoral vein and including the common femoral, femoral, profunda femoral, popliteal and calf veins including the posterior tibial, peroneal and gastrocnemius veins when visible. The superficial great saphenous vein was also interrogated. Spectral Doppler was utilized to evaluate flow at rest and with distal augmentation maneuvers in the common femoral, femoral and popliteal veins. COMPARISON:  06/05/2018 FINDINGS: RIGHT LOWER EXTREMITY Common Femoral Vein: No evidence of thrombus. Normal compressibility, respiratory phasicity and response to augmentation. Linear echogenicity in the right common femoral vein presumably representing central line. Saphenofemoral Junction: No evidence of thrombus. Normal compressibility and flow on color Doppler imaging. Profunda Femoral Vein: No evidence of thrombus. Normal compressibility and flow on color Doppler imaging. Femoral Vein: No evidence of thrombus. Normal compressibility, respiratory phasicity and response  to augmentation. Popliteal Vein: No evidence of thrombus. Normal compressibility, respiratory phasicity and response to augmentation. Calf Veins: No evidence of thrombus. Normal compressibility and flow on color Doppler imaging. LEFT LOWER EXTREMITY Common Femoral Vein: No evidence of thrombus. Normal compressibility, respiratory phasicity and response to augmentation. Saphenofemoral Junction: Unable to be evaluated secondary to groin bandage. Profunda Femoral Vein: No evidence of thrombus. Normal compressibility and flow on color Doppler imaging. Femoral Vein: No evidence of thrombus. Normal compressibility, respiratory phasicity and response to augmentation. Popliteal Vein: No evidence of thrombus. Normal compressibility, respiratory phasicity and response to augmentation. Calf Veins: No evidence of thrombus. Normal compressibility and flow on color Doppler imaging. Other Findings:  None. IMPRESSION: No evidence of deep venous thrombosis. Limited evaluation of left saphenofemoral junction due to groin bandage. Electronically Signed   By: Donavan Foil M.D.   On: 07/16/2018 01:15   Portable Chest Xray  Result Date: 07/16/2018 CLINICAL DATA:  Respiratory failure EXAM: PORTABLE CHEST 1 VIEW COMPARISON:  Yesterday FINDINGS: Endotracheal tube tip at the clavicular heads. The orogastric tube at least reaches the stomach. Cardiomegaly with CABG and transcatheter aortic valve replacement. Small left pleural effusion. Right hilar enlargement that is vascular based on December 2019 chest CT. IMPRESSION: 1. Unremarkable hardware positioning. 2. Stable cardiomegaly and small left effusion. Electronically Signed   By: Monte Fantasia M.D.   On: 07/16/2018 06:40   Portable Chest X-ray  Result Date: 07/23/2018 CLINICAL DATA:  78 year old male status post intubation. EXAM: PORTABLE CHEST 1 VIEW COMPARISON:  Chest radiograph dated 06/27/2018 FINDINGS: An  endotracheal tube is noted with tip approximately 5 cm above the  carina. An enteric tube extends into the left upper abdomen with tip and side-port the region of the body of the stomach. Left-sided pleural effusion and left lung base atelectasis or infiltrate. Overall slight the improved aeration of the left lung compared to the prior radiograph. There is cardiomegaly with bilateral hilar vascular prominence and congestion. No pneumothorax. Median sternotomy wires, CABG vascular clips, and aortic valve repair. No bowel dilatation in the visualized abdomen. Osteopenia with scoliosis and degenerative changes of the spine. IMPRESSION: 1. Endotracheal tube above the carina and enteric tube within the stomach. 2. Cardiomegaly with vascular congestion. 3. Slight interval improvement in aeration of the left lung and slight decrease in left-sided pleural effusion and left lung base atelectasis or infiltrate. Electronically Signed   By: Anner Crete M.D.   On: 07/17/2018 20:50   Dg Chest Port 1 View  Result Date: 06/26/2018 CLINICAL DATA:  Status post left-sided thoracentesis EXAM: PORTABLE CHEST 1 VIEW COMPARISON:  June 25, 2018 FINDINGS: No evident pneumothorax. There is a small at least partially loculated pleural effusion on the left. There is patchy consolidation in the left base. The right lung is clear. Heart is mildly enlarged with pulmonary vascularity normal. 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 There is aortic atherosclerosis. No evident adenopathy. There is postoperative change in the right shoulder with remodeling of the right humeral head, stable. IMPRESSION: No pneumothorax. Partially loculated pleural effusion left base with left base consolidation, likely atelectasis with potential superimposed pneumonia. Right lung clear. Stable cardiac silhouette. There is aortic atherosclerosis. Postoperative change in the right shoulder with remodeling right humeral head. Aortic Atherosclerosis (ICD10-I70.0). Electronically  Signed   By: Lowella Grip III M.D.   On: 06/26/2018 10:05   Dg Knee Complete 4 Views Right  Result Date: 07/12/2018 CLINICAL DATA:  Pain following fall EXAM: RIGHT KNEE - COMPLETE 4+ VIEW COMPARISON:  May 22, 2018 FINDINGS: Frontal, lateral, and bilateral oblique views were obtained. 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 No acute fracture or dislocation. There is a moderate joint effusion. There is extensive soft tissue swelling anteriorly. There is extensive arterial vascular calcification. IMPRESSION: Soft tissue swelling anteriorly with moderate joint effusion. No fracture or dislocation evident. Total knee replacement prosthetic components appear well seated. There is multifocal arterial vascular calcification/atherosclerosis. Electronically Signed   By: Lowella Grip III M.D.   On: 07/12/2018 07:41   Dg Abd Portable 1v  Result Date: 07/11/2018 CLINICAL DATA:  78 year old male status post intubation. EXAM: PORTABLE CHEST 1 VIEW COMPARISON:  Chest radiograph dated 06/27/2018 FINDINGS: An endotracheal tube is noted with tip approximately 5 cm above the carina. An enteric tube extends into the left upper abdomen with tip and side-port the region of the body of the stomach. Left-sided pleural effusion and left lung base atelectasis or infiltrate. Overall slight the improved aeration of the left lung compared to the prior radiograph. There is cardiomegaly with bilateral hilar vascular prominence and congestion. No pneumothorax. Median sternotomy wires, CABG vascular clips, and aortic valve repair. No bowel dilatation in the visualized abdomen. Osteopenia with scoliosis and degenerative changes of the spine. IMPRESSION: 1. Endotracheal tube above the carina and enteric tube within the stomach. 2. Cardiomegaly with vascular congestion. 3. Slight interval improvement in aeration of the left lung and slight decrease in left-sided pleural effusion and  left lung base atelectasis or infiltrate. Electronically Signed  By: Anner Crete M.D.   On: 07/17/2018 20:50   US Thoracentesis Asp Pleural Space W/img Guide  Result Date: 06/26/2018 INDICATION: Shortness of breath and recurrent left pleural effusion. EXAM: ULTRASOUND GUIDED LEFT THORACENTESIS MEDICATIONS: None. COMPLICATIONS: None immediate. PROCEDURE: An ultrasound guided thoracentesis was thoroughly discussed with the patient and questions answered. The benefits, risks, alternatives and complications were also discussed. The patient understands and wishes to proceed with the procedure. Written consent was obtained. Ultrasound was performed to localize and mark an adequate pocket of fluid in the left chest. The area was then prepped and draped in the normal sterile fashion. 1% Lidocaine was used for local anesthesia. Under ultrasound guidance a 6 Fr Safe-T-Centesis catheter was introduced. Thoracentesis was performed. The catheter was removed and a dressing applied. FINDINGS: A total of approximately 500 mL of amber fluid was removed. Small amount of residual fluid at end of the procedure. No additional fluid was removed due to patient discomfort. IMPRESSION: Successful ultrasound guided left thoracentesis yielding 500 mL of pleural fluid. Electronically Signed   By: Markus Daft M.D.   On: 06/26/2018 10:03     Assessment and Recommendation  78 y.o. male with known coronary artery disease status post coronary to bypass graft essential hypertension mixed hyperlipidemia with severe dilated cardiomyopathy on appropriate medication management having circulatory collapse and congestive heart failure likely poor output cardiac distress during anesthesia now improved without evidence of myocardial infarction 1.  Continue circulatory support as necessary including dopamine until patient able to have his own pressure support 2.  Continue supportive care of oxygenation with intubation and extubation when  able 3.  Echocardiogram pending but likely most consistent with previous cardiomyopathy with poor cardiac output 4.  Okay for discontinuation of amiodarone due to no evidence of myocardial infarction or true rhythm disturbances and likely collapse due to poor cardiac output heart failure 5.  Further treatment options after above  Signed, Serafina Royals M.D. FACC

## 2018-07-17 ENCOUNTER — Encounter: Payer: Self-pay | Admitting: Orthopedic Surgery

## 2018-07-17 ENCOUNTER — Inpatient Hospital Stay: Payer: Medicare Other

## 2018-07-17 ENCOUNTER — Ambulatory Visit: Payer: Self-pay | Admitting: Cardiothoracic Surgery

## 2018-07-17 DIAGNOSIS — R579 Shock, unspecified: Secondary | ICD-10-CM

## 2018-07-17 DIAGNOSIS — R6520 Severe sepsis without septic shock: Secondary | ICD-10-CM

## 2018-07-17 DIAGNOSIS — A419 Sepsis, unspecified organism: Secondary | ICD-10-CM

## 2018-07-17 LAB — CBC
HCT: 33.9 % — ABNORMAL LOW (ref 39.0–52.0)
Hemoglobin: 10.2 g/dL — ABNORMAL LOW (ref 13.0–17.0)
MCH: 27.3 pg (ref 26.0–34.0)
MCHC: 30.1 g/dL (ref 30.0–36.0)
MCV: 90.6 fL (ref 80.0–100.0)
Platelets: 179 10*3/uL (ref 150–400)
RBC: 3.74 MIL/uL — AB (ref 4.22–5.81)
RDW: 18.9 % — ABNORMAL HIGH (ref 11.5–15.5)
WBC: 9.2 10*3/uL (ref 4.0–10.5)
nRBC: 0 % (ref 0.0–0.2)

## 2018-07-17 LAB — RENAL FUNCTION PANEL
ALBUMIN: 2.9 g/dL — AB (ref 3.5–5.0)
Albumin: 2.5 g/dL — ABNORMAL LOW (ref 3.5–5.0)
Albumin: 2.6 g/dL — ABNORMAL LOW (ref 3.5–5.0)
Albumin: 2.5 g/dL — ABNORMAL LOW (ref 3.5–5.0)
Albumin: 2.8 g/dL — ABNORMAL LOW (ref 3.5–5.0)
Albumin: 2.5 g/dL — ABNORMAL LOW (ref 3.5–5.0)
Anion gap: 12 (ref 5–15)
Anion gap: 11 (ref 5–15)
Anion gap: 10 (ref 5–15)
Anion gap: 7 (ref 5–15)
Anion gap: 7 (ref 5–15)
Anion gap: 6 (ref 5–15)
BUN: 33 mg/dL — ABNORMAL HIGH (ref 8–23)
BUN: 28 mg/dL — ABNORMAL HIGH (ref 8–23)
BUN: 25 mg/dL — ABNORMAL HIGH (ref 8–23)
BUN: 26 mg/dL — ABNORMAL HIGH (ref 8–23)
BUN: 24 mg/dL — ABNORMAL HIGH (ref 8–23)
BUN: 23 mg/dL (ref 8–23)
CALCIUM: 8.2 mg/dL — AB (ref 8.9–10.3)
CALCIUM: 8.4 mg/dL — AB (ref 8.9–10.3)
CHLORIDE: 101 mmol/L (ref 98–111)
CO2: 26 mmol/L (ref 22–32)
CO2: 25 mmol/L (ref 22–32)
CO2: 25 mmol/L (ref 22–32)
CO2: 27 mmol/L (ref 22–32)
CO2: 27 mmol/L (ref 22–32)
CO2: 27 mmol/L (ref 22–32)
Calcium: 8.3 mg/dL — ABNORMAL LOW (ref 8.9–10.3)
Calcium: 8.1 mg/dL — ABNORMAL LOW (ref 8.9–10.3)
Calcium: 8.2 mg/dL — ABNORMAL LOW (ref 8.9–10.3)
Calcium: 8.4 mg/dL — ABNORMAL LOW (ref 8.9–10.3)
Chloride: 98 mmol/L (ref 98–111)
Chloride: 98 mmol/L (ref 98–111)
Chloride: 100 mmol/L (ref 98–111)
Chloride: 102 mmol/L (ref 98–111)
Chloride: 101 mmol/L (ref 98–111)
Creatinine, Ser: 3.67 mg/dL — ABNORMAL HIGH (ref 0.61–1.24)
Creatinine, Ser: 3.22 mg/dL — ABNORMAL HIGH (ref 0.61–1.24)
Creatinine, Ser: 2.96 mg/dL — ABNORMAL HIGH (ref 0.61–1.24)
Creatinine, Ser: 2.8 mg/dL — ABNORMAL HIGH (ref 0.61–1.24)
Creatinine, Ser: 2.63 mg/dL — ABNORMAL HIGH (ref 0.61–1.24)
Creatinine, Ser: 2.53 mg/dL — ABNORMAL HIGH (ref 0.61–1.24)
GFR calc Af Amer: 18 mL/min — ABNORMAL LOW (ref >60)
GFR calc Af Amer: 21 mL/min — ABNORMAL LOW (ref >60)
GFR calc Af Amer: 23 mL/min — ABNORMAL LOW (ref >60)
GFR calc Af Amer: 24 mL/min — ABNORMAL LOW (ref >60)
GFR calc Af Amer: 26 mL/min — ABNORMAL LOW (ref >60)
GFR calc Af Amer: 27 mL/min — ABNORMAL LOW (ref >60)
GFR calc non Af Amer: 20 mL/min — ABNORMAL LOW (ref >60)
GFR calc non Af Amer: 23 mL/min — ABNORMAL LOW (ref >60)
GFR calc non Af Amer: 24 mL/min — ABNORMAL LOW (ref >60)
GFR, EST NON AFRICAN AMERICAN: 15 mL/min — AB (ref >60)
GFR, EST NON AFRICAN AMERICAN: 18 mL/min — AB (ref >60)
GFR, EST NON AFRICAN AMERICAN: 21 mL/min — AB (ref >60)
Glucose, Bld: 124 mg/dL — ABNORMAL HIGH (ref 70–99)
Glucose, Bld: 120 mg/dL — ABNORMAL HIGH (ref 70–99)
Glucose, Bld: 118 mg/dL — ABNORMAL HIGH (ref 70–99)
Glucose, Bld: 124 mg/dL — ABNORMAL HIGH (ref 70–99)
Glucose, Bld: 131 mg/dL — ABNORMAL HIGH (ref 70–99)
Glucose, Bld: 130 mg/dL — ABNORMAL HIGH (ref 70–99)
PHOSPHORUS: 4.7 mg/dL — AB (ref 2.5–4.6)
Phosphorus: 4.6 mg/dL (ref 2.5–4.6)
Phosphorus: 4 mg/dL (ref 2.5–4.6)
Phosphorus: 4.2 mg/dL (ref 2.5–4.6)
Phosphorus: 4.9 mg/dL — ABNORMAL HIGH (ref 2.5–4.6)
Phosphorus: 4.6 mg/dL (ref 2.5–4.6)
Potassium: 4 mmol/L (ref 3.5–5.1)
Potassium: 3.9 mmol/L (ref 3.5–5.1)
Potassium: 3.8 mmol/L (ref 3.5–5.1)
Potassium: 4.2 mmol/L (ref 3.5–5.1)
Potassium: 4.3 mmol/L (ref 3.5–5.1)
Potassium: 4.2 mmol/L (ref 3.5–5.1)
SODIUM: 136 mmol/L (ref 135–145)
Sodium: 136 mmol/L (ref 135–145)
Sodium: 134 mmol/L — ABNORMAL LOW (ref 135–145)
Sodium: 135 mmol/L (ref 135–145)
Sodium: 135 mmol/L (ref 135–145)
Sodium: 134 mmol/L — ABNORMAL LOW (ref 135–145)

## 2018-07-17 LAB — APTT: aPTT: 42 seconds — ABNORMAL HIGH (ref 24–36)

## 2018-07-17 LAB — MAGNESIUM
MAGNESIUM: 2.2 mg/dL (ref 1.7–2.4)
MAGNESIUM: 2.2 mg/dL (ref 1.7–2.4)
Magnesium: 2 mg/dL (ref 1.7–2.4)
Magnesium: 2 mg/dL (ref 1.7–2.4)
Magnesium: 2.2 mg/dL (ref 1.7–2.4)
Magnesium: 2.1 mg/dL (ref 1.7–2.4)

## 2018-07-17 LAB — GLUCOSE, CAPILLARY
GLUCOSE-CAPILLARY: 116 mg/dL — AB (ref 70–99)
Glucose-Capillary: 110 mg/dL — ABNORMAL HIGH (ref 70–99)
Glucose-Capillary: 116 mg/dL — ABNORMAL HIGH (ref 70–99)
Glucose-Capillary: 104 mg/dL — ABNORMAL HIGH (ref 70–99)
Glucose-Capillary: 115 mg/dL — ABNORMAL HIGH (ref 70–99)
Glucose-Capillary: 105 mg/dL — ABNORMAL HIGH (ref 70–99)

## 2018-07-17 LAB — TROPONIN I: Troponin I: 0.54 ng/mL (ref <0.03)

## 2018-07-17 LAB — PROCALCITONIN: Procalcitonin: 15.41 ng/mL

## 2018-07-17 LAB — ECHOCARDIOGRAM COMPLETE

## 2018-07-17 MED ORDER — NEPRO/CARBSTEADY PO LIQD
1000.0000 mL | ORAL | Status: DC
  Administered 2018-07-17: 1000 mL via ORAL

## 2018-07-17 MED ORDER — PRO-STAT SUGAR FREE PO LIQD
30.0000 mL | Freq: Three times a day (TID) | ORAL | Status: DC
  Administered 2018-07-17 – 2018-07-20 (×11): 30 mL

## 2018-07-17 MED ORDER — B COMPLEX-C PO TABS
1.0000 | ORAL_TABLET | Freq: Every day | ORAL | Status: DC
  Administered 2018-07-17 – 2018-07-20 (×4): 1
  Filled 2018-07-17 (×6): qty 1

## 2018-07-17 MED ORDER — VITAMIN C 500 MG PO TABS
250.0000 mg | ORAL_TABLET | Freq: Two times a day (BID) | ORAL | Status: DC
  Administered 2018-07-17 – 2018-07-20 (×7): 250 mg
  Filled 2018-07-17 (×10): qty 0.5

## 2018-07-17 MED ORDER — PRO-STAT SUGAR FREE PO LIQD
30.0000 mL | Freq: Two times a day (BID) | ORAL | Status: DC
  Administered 2018-07-17: 30 mL

## 2018-07-17 MED ORDER — FREE WATER
30.0000 mL | Status: DC
  Administered 2018-07-17 – 2018-07-21 (×23): 30 mL

## 2018-07-17 MED ORDER — FAMOTIDINE 20 MG PO TABS
20.0000 mg | ORAL_TABLET | Freq: Every day | ORAL | Status: DC
  Administered 2018-07-17 – 2018-07-20 (×4): 20 mg
  Filled 2018-07-17 (×4): qty 1

## 2018-07-17 MED ORDER — VITAL HIGH PROTEIN PO LIQD: 1000.0000 mL | ORAL | Status: DC

## 2018-07-17 NOTE — Progress Notes (Signed)
CRRT clotted at 1345, cartridge changed and CRRT restarted at 1450. Venous, and effluent pressures continue higher than previous, but within [facility] parameters.  Dr Juleen China consulted and we will run dialysate at 2 liters/hr and blood flow at 300 ml/min, to be adjusted as necessary.  K has increased to 4.2 so we will continue 2K bath for now. UF of 50 ordered by Dr Juleen China.  UF tolerated with pressors   Pt had bradycardic episode as low as 37 BPM right after dopmine paused 2-3 minutes while drawing blood from CL.  He rebounded to 70 BPM after Dopamine restarted and titrated up to 8 mcg/min.  Currently Dopamine is infusing at 4 mcg/min.  He also continues with frequent PVCs   Pressures also soft after CRRT cartridge changed; levophed titrated up to 12 mcg/min.  Pt responsive and followed commands as able during [place].  Per Carol Ada he wishes to live if at all possible and she wishes him to remain a Full Code for now because she feels strongly that is what he would want

## 2018-07-17 NOTE — Progress Notes (Signed)
Pharmacy CRRT Monitoring Consult:  Pharmacy consulted to assist in monitoring and adjusting medications while on CRRT in this 78 y.o. male admitted on 07/20/2018 with cardiogenic and possible septic shock s/p elective I&D of right knee wound.   Assessment/Plan: Antibiotics have been adjusted. No further adjustments warranted.   Pharmacy will continue to monitor and adjust per consult.   Shirlean Berman L 07/17/2018 3:32 PM

## 2018-07-17 NOTE — Progress Notes (Signed)
[facility] Encounter Note  Patient: Jimmy Carson / Admit Date: 07/06/2018 / Date of Encounter: 07/17/2018, 8:39 AM   Subjective: Patient more hemodynamically stable today.  Receiving dialysis without difficult changes.  Heart rate controlled with new EKG changes showing loss of R wave in inferior leads but continues to have left bundle branch block as before.  Troponin level minimally elevated most consistent with demand ischemia and/or CPR.  BNP extremely high consistent with poor cardiac output heart failure during anesthesia and surgical intervention.  Patient neurologically unclear.  15 beat run of supraventricular tachycardia with bundle branch block yesterday of unknown significance  Review of Systems: Cannot assess due to sedation Objective: Telemetry: Sinus tachycardia with bundle branch block Physical Exam: Blood pressure 109/72, pulse 73, temperature 99.3 F (37.4 C), temperature source Oral, resp. rate 18, height 5' 10"  (1.778 m), weight 79.4 kg, SpO2 99 %. Body mass index is 25.12 kg/m. General: Well developed, well nourished, on the ventilator Head: Normocephalic, atraumatic, sclera non-icteric, no xanthomas, nares are without discharge. Neck: No apparent masses Lungs: Normal respirations with no wheezes, no rhonchi, no rales , no crackles   Heart: Regular rate and rhythm, normal S1 S2, right upper sternal border murmur, no rub, no gallop, PMI is normal size and placement, carotid upstroke normal without bruit, jugular venous pressure normal Abdomen: Soft, non-tender, non-distended with normoactive bowel sounds. No hepatosplenomegaly. Abdominal aorta is normal size without bruit Extremities: Trace edema, no clubbing, no cyanosis, no ulcers,  Peripheral: 2+ radial, 2+ femoral, 2+ dorsal pedal pulses Neuro: Not alert and oriented. Moves all extremities spontaneously. Psych: Does not responds to questions appropriately with a normal  affect.   Intake/Output Summary (Last 24 hours) at 07/17/2018 0839 Last data filed at 07/17/2018 0800 Gross per 24 hour  Intake 1422.7 ml  Output 0 ml  Net 1422.7 ml    Inpatient Medications:  . chlorhexidine gluconate (MEDLINE KIT)  15 mL Mouth Rinse BID  . heparin injection (subcutaneous)  5,000 Units Subcutaneous Q8H  . hydrocortisone sod succinate (SOLU-CORTEF) inj  50 mg Intravenous Q6H  . insulin aspart  0-15 Units Subcutaneous Q4H  . mouth rinse  15 mL Mouth Rinse 10 times per day   Infusions:  . ceFEPime (MAXIPIME) IV Stopped (07/17/18 0531)  . DOPamine 5 mcg/kg/min (07/16/18 2334)  . famotidine (PEPCID) IV    . fentaNYL infusion INTRAVENOUS 75 mcg/hr (07/17/18 0800)  . norepinephrine (LEVOPHED) Adult infusion 4 mcg/min (07/17/18 0800)  . propofol (DIPRIVAN) infusion 17 mcg/kg/min (07/17/18 0800)  . pureflow 3,000 mL/hr at 07/17/18 0707  . vancomycin Stopped (07/16/18 1733)    Labs: Recent Labs    07/17/18 0218 07/17/18 0620  NA 136 134*  K 4.0 3.9  CL 98 98  CO2 26 25  GLUCOSE 124* 120*  BUN 33* 28*  CREATININE 3.67* 3.22*  CALCIUM 8.3* 8.1*  MG 2.2 2.2  PHOS 4.6 4.0   Recent Labs    06/29/2018 1933 07/16/18 0448  07/17/18 0218 07/17/18 0620  AST 43* 76*  --   --   --   ALT 19 39  --   --   --   ALKPHOS 89 114  --   --   --   BILITOT 0.9 1.0  --   --   --   PROT 5.8* 6.3*  --   --   --   ALBUMIN 2.7* 2.5*   < > 2.9* 2.5*   < > = values in this interval  not displayed.   Recent Labs    06/28/2018 1933 07/16/18 0536 07/17/18 0407  WBC 5.8 6.8 9.2  NEUTROABS 3.7  --   --   HGB 9.9* 9.5* 10.2*  HCT 35.3* 31.9* 33.9*  MCV 99.2 91.9 90.6  PLT 161 167 179   Recent Labs    07/12/2018 1933 07/16/18 0118 07/16/18 1217 07/17/18 0218  TROPONINI 0.03* 0.87* 1.48* 0.54*   Invalid input(s): POCBNP No results for input(s): HGBA1C in the last 72 hours.   Weights: Filed Weights   07/16/18 0800 07/17/18 0220  Weight: 75.6 kg 79.4 kg      Radiology/Studies:  Dg Chest 2 View  Result Date: 06/27/2018 CLINICAL DATA:  Left-sided pleural effusion. Follow-up. EXAM: CHEST - 2 VIEW COMPARISON:  06/26/2018 FINDINGS: Previous median sternotomy and CABG. Previous aortic valve replacement. Right chest remains clear. Persistent left effusion with atelectasis and or infiltrate in the left lower lung. Findings may be slightly worsened on the left. IMPRESSION: Persistent left effusion with atelectasis and or infiltrate in the left lower lung. Findings may be slightly worsened on the left. Electronically Signed   By: Nelson Chimes M.D.   On: 06/27/2018 08:15   Dg Chest 2 View  Result Date: 06/25/2018 CLINICAL DATA:  Shortness of breath EXAM: CHEST - 2 VIEW COMPARISON:  06/11/2018 FINDINGS: Post sternotomy changes with valve prosthesis. Small left-sided pleural effusion, slightly increased compared to prior. Trace right pleural effusion. Cardiomegaly with vascular congestion. Patchy airspace disease at the left base. Aortic atherosclerosis. No pneumothorax. IMPRESSION: 1. Small left-sided pleural effusion, slight increase as compared to radiograph 06/11/2018. Trace right pleural effusion 2. Cardiomegaly with vascular congestion. Patchy airspace disease at the left lung base. Electronically Signed   By: Donavan Foil M.D.   On: 06/25/2018 18:50   US Venous Img Lower Bilateral  Result Date: 07/16/2018 CLINICAL DATA:  Bilateral leg swelling EXAM: BILATERAL LOWER EXTREMITY VENOUS DOPPLER ULTRASOUND TECHNIQUE: Gray-scale sonography with graded compression, as well as color Doppler and duplex ultrasound were performed to evaluate the lower extremity deep venous systems from the level of the common femoral vein and including the common femoral, femoral, profunda femoral, popliteal and calf veins including the posterior tibial, peroneal and gastrocnemius veins when visible. The superficial great saphenous vein was also interrogated. Spectral Doppler was  utilized to evaluate flow at rest and with distal augmentation maneuvers in the common femoral, femoral and popliteal veins. COMPARISON:  06/05/2018 FINDINGS: RIGHT LOWER EXTREMITY Common Femoral Vein: No evidence of thrombus. Normal compressibility, respiratory phasicity and response to augmentation. Linear echogenicity in the right common femoral vein presumably representing central line. Saphenofemoral Junction: No evidence of thrombus. Normal compressibility and flow on color Doppler imaging. Profunda Femoral Vein: No evidence of thrombus. Normal compressibility and flow on color Doppler imaging. Femoral Vein: No evidence of thrombus. Normal compressibility, respiratory phasicity and response to augmentation. Popliteal Vein: No evidence of thrombus. Normal compressibility, respiratory phasicity and response to augmentation. Calf Veins: No evidence of thrombus. Normal compressibility and flow on color Doppler imaging. LEFT LOWER EXTREMITY Common Femoral Vein: No evidence of thrombus. Normal compressibility, respiratory phasicity and response to augmentation. Saphenofemoral Junction: Unable to be evaluated secondary to groin bandage. Profunda Femoral Vein: No evidence of thrombus. Normal compressibility and flow on color Doppler imaging. Femoral Vein: No evidence of thrombus. Normal compressibility, respiratory phasicity and response to augmentation. Popliteal Vein: No evidence of thrombus. Normal compressibility, respiratory phasicity and response to augmentation. Calf Veins: No evidence of thrombus. Normal compressibility and  flow on color Doppler imaging. Other Findings:  None. IMPRESSION: No evidence of deep venous thrombosis. Limited evaluation of left saphenofemoral junction due to groin bandage. Electronically Signed   By: Donavan Foil M.D.   On: 07/16/2018 01:15   Dg Chest Port 1 View  Result Date: 07/17/2018 CLINICAL DATA:  Respiratory failure EXAM: PORTABLE CHEST 1 VIEW COMPARISON:  Yesterday  FINDINGS: Endotracheal tube tip just below the clavicular heads. The orogastric tube at least reaches the diaphragm. Artifact from EKG leads. Left pleural effusion and obscured left lower lobe. There is cardiomegaly. Prior median sternotomy for CABG. Transcatheter aortic valve replacement. Prominent right hilum that is vascular based on 2019 chest CT. IMPRESSION: Stable hardware positioning and low lung volumes with left pleural effusion. Electronically Signed   By: Monte Fantasia M.D.   On: 07/17/2018 06:18   Portable Chest Xray  Result Date: 07/16/2018 CLINICAL DATA:  Respiratory failure EXAM: PORTABLE CHEST 1 VIEW COMPARISON:  Yesterday FINDINGS: Endotracheal tube tip at the clavicular heads. The orogastric tube at least reaches the stomach. Cardiomegaly with CABG and transcatheter aortic valve replacement. Small left pleural effusion. Right hilar enlargement that is vascular based on December 2019 chest CT. IMPRESSION: 1. Unremarkable hardware positioning. 2. Stable cardiomegaly and small left effusion. Electronically Signed   By: Monte Fantasia M.D.   On: 07/16/2018 06:40   Portable Chest X-ray  Result Date: 07/21/2018 CLINICAL DATA:  78 year old male status post intubation. EXAM: PORTABLE CHEST 1 VIEW COMPARISON:  Chest radiograph dated 06/27/2018 FINDINGS: An endotracheal tube is noted with tip approximately 5 cm above the carina. An enteric tube extends into the left upper abdomen with tip and side-port the region of the body of the stomach. Left-sided pleural effusion and left lung base atelectasis or infiltrate. Overall slight the improved aeration of the left lung compared to the prior radiograph. There is cardiomegaly with bilateral hilar vascular prominence and congestion. No pneumothorax. Median sternotomy wires, CABG vascular clips, and aortic valve repair. No bowel dilatation in the visualized abdomen. Osteopenia with scoliosis and degenerative changes of the spine. IMPRESSION: 1.  Endotracheal tube above the carina and enteric tube within the stomach. 2. Cardiomegaly with vascular congestion. 3. Slight interval improvement in aeration of the left lung and slight decrease in left-sided pleural effusion and left lung base atelectasis or infiltrate. Electronically Signed   By: Anner Crete M.D.   On: 07/01/2018 20:50   Dg Chest Port 1 View  Result Date: 06/26/2018 CLINICAL DATA:  Status post left-sided thoracentesis EXAM: PORTABLE CHEST 1 VIEW COMPARISON:  June 25, 2018 FINDINGS: No evident pneumothorax. There is a small at least partially loculated pleural effusion on the left. There is patchy consolidation in the left base. The right lung is clear. Heart is mildly enlarged with pulmonary vascularity normal. 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 There is aortic atherosclerosis. No evident adenopathy. There is postoperative change in the right shoulder with remodeling of the right humeral head, stable. IMPRESSION: No pneumothorax. Partially loculated pleural effusion left base with left base consolidation, likely atelectasis with potential superimposed pneumonia. Right lung clear. Stable cardiac silhouette. There is aortic atherosclerosis. Postoperative change in the right shoulder with remodeling right humeral head. Aortic Atherosclerosis (ICD10-I70.0). Electronically Signed   By: Lowella Grip III M.D.   On: 06/26/2018 10:05   Dg Knee Complete 4 Views Right  Result Date: 07/12/2018 CLINICAL DATA:  Pain following fall EXAM: RIGHT KNEE - COMPLETE 4+ VIEW COMPARISON:  May 22, 2018 FINDINGS: Frontal, lateral, and bilateral oblique views were obtained. 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 No acute fracture or dislocation. There is a moderate joint effusion. There is extensive soft tissue swelling anteriorly. There is extensive arterial vascular calcification. IMPRESSION: Soft tissue  swelling anteriorly with moderate joint effusion. No fracture or dislocation evident. Total knee replacement prosthetic components appear well seated. There is multifocal arterial vascular calcification/atherosclerosis. Electronically Signed   By: Lowella Grip III M.D.   On: 07/12/2018 07:41   Dg Abd Portable 1v  Result Date: 07/09/2018 CLINICAL DATA:  78 year old male status post intubation. EXAM: PORTABLE CHEST 1 VIEW COMPARISON:  Chest radiograph dated 06/27/2018 FINDINGS: An endotracheal tube is noted with tip approximately 5 cm above the carina. An enteric tube extends into the left upper abdomen with tip and side-port the region of the body of the stomach. Left-sided pleural effusion and left lung base atelectasis or infiltrate. Overall slight the improved aeration of the left lung compared to the prior radiograph. There is cardiomegaly with bilateral hilar vascular prominence and congestion. No pneumothorax. Median sternotomy wires, CABG vascular clips, and aortic valve repair. No bowel dilatation in the visualized abdomen. Osteopenia with scoliosis and degenerative changes of the spine. IMPRESSION: 1. Endotracheal tube above the carina and enteric tube within the stomach. 2. Cardiomegaly with vascular congestion. 3. Slight interval improvement in aeration of the left lung and slight decrease in left-sided pleural effusion and left lung base atelectasis or infiltrate. Electronically Signed   By: Anner Crete M.D.   On: 06/28/2018 20:50   US Thoracentesis Asp Pleural Space W/img Guide  Result Date: 06/26/2018 INDICATION: Shortness of breath and recurrent left pleural effusion. EXAM: ULTRASOUND GUIDED LEFT THORACENTESIS MEDICATIONS: None. COMPLICATIONS: None immediate. PROCEDURE: An ultrasound guided thoracentesis was thoroughly discussed with the patient and questions answered. The benefits, risks, alternatives and complications were also discussed. The patient understands and wishes to proceed  with the procedure. Written consent was obtained. Ultrasound was performed to localize and mark an adequate pocket of fluid in the left chest. The area was then prepped and draped in the normal sterile fashion. 1% Lidocaine was used for local anesthesia. Under ultrasound guidance a 6 Fr Safe-T-Centesis catheter was introduced. Thoracentesis was performed. The catheter was removed and a dressing applied. FINDINGS: A total of approximately 500 mL of amber fluid was removed. Small amount of residual fluid at end of the procedure. No additional fluid was removed due to patient discomfort. IMPRESSION: Successful ultrasound guided left thoracentesis yielding 500 mL of pleural fluid. Electronically Signed   By: Markus Daft M.D.   On: 06/26/2018 10:03     Assessment and Recommendation  78 y.o. male with known coronary artery disease status post coronary to bypass graft essential hypertension mixed hyperlipidemia with severe dilated cardiomyopathy on appropriate medication management having circulatory collapse and congestive heart failure likely poor output cardiac distress during anesthesia now improved without evidence of acute coronary syndrome but did have a mild myocardial infarction. 1.  Continue circulatory support as necessary including dopamine until patient able to have his own pressure support and watch closely for other rhythm disturbances requiring further intervention.  Would not add any medication management at this time due to significant concerns of and inducing bradycardia and/or hypotension 2.  Continue supportive care of oxygenation with intubation and extubation when able 3.  Echocardiogram pending but likely most consistent with previous cardiomyopathy with poor cardiac output as before 4.   Continue  dialysis without restriction 5.  Further treatment options after above  Signed, Serafina Royals M.D. FACC

## 2018-07-17 NOTE — Progress Notes (Signed)
[facility]  ROUNDING NOTE   Subjective:   CRRT overnight. No ultrafiltration. Tolerating continuous dialysis with no complications.   Dopamine and norepinephrine gtt  Family at bedside.   Objective:  Vital signs in last 24 hours:  Temp:  [93 F (33.9 C)-99.3 F (37.4 C)] 93 F (33.9 C) (01/24 0900) Pulse Rate:  [49-87] 62 (01/24 0900) Resp:  [0-19] 18 (01/24 0900) BP: (89-148)/(52-72) 128/67 (01/24 0900) SpO2:  [96 %-100 %] 99 % (01/24 0900) FiO2 (%):  [40 %] 40 % (01/24 0800) Weight:  [79.4 kg] 79.4 kg (01/24 0220)  Weight change:  Filed Weights   07/16/18 0800 07/17/18 0220  Weight: 75.6 kg 79.4 kg    Intake/Output: I/O last 3 completed shifts: In: 2474.6 [I.V.:1631.4; IV Piggyback:843.2] Out: 15 [Urine:10; Blood:5]   Intake/Output this shift:  Total I/O In: 74.5 [I.V.:74.5] Out: 0   Physical Exam: General: Critically ill  Head: ETT  Eyes:  PERRL  Neck:  trachea midline  Lungs:  PRVC FiO2 40%  Heart: Regular rate and rhythm  Abdomen:  Soft   Extremities:  no peripheral edema.  Neurologic: Intubated and sedated  Skin: No lesions  Access: Left AVF    Basic Metabolic Panel: Recent Labs  Lab 07/16/18 0448 07/16/18 1217 07/16/18 2224 07/17/18 0218 07/17/18 0620  NA 135 135 136 136 134*  K 5.4* 5.6* 4.0 4.0 3.9  CL 94* 94* 99 98 98  CO2 28 28 27 26 25   GLUCOSE 146* 110* 118* 124* 120*  BUN 48* 47* 32* 33* 28*  CREATININE 6.37* 6.77* 3.70* 3.67* 3.22*  CALCIUM 7.3* 7.3* 8.0* 8.3* 8.1*  MG  --  2.5* 2.2 2.2 2.2  PHOS  --  7.3* 4.3 4.6 4.0    Liver Function Tests: Recent Labs  Lab 06/27/2018 1933 07/16/18 0448 07/16/18 1217 07/16/18 2224 07/17/18 0218 07/17/18 0620  AST 43* 76*  --   --   --   --   ALT 19 39  --   --   --   --   ALKPHOS 89 114  --   --   --   --   BILITOT 0.9 1.0  --   --   --   --   PROT 5.8* 6.3*  --   --   --   --   ALBUMIN 2.7* 2.5* 2.6* 2.7* 2.9* 2.5*   No results for input(s): LIPASE, AMYLASE in the  last 168 hours. No results for input(s): AMMONIA in the last 168 hours.  CBC: Recent Labs  Lab 07/14/18 1056 07/10/2018 1543 07/02/2018 1933 07/16/18 0536 07/17/18 0407  WBC 5.4  --  5.8 6.8 9.2  NEUTROABS  --   --  3.7  --   --   HGB 8.3* 10.2* 9.9* 9.5* 10.2*  HCT 28.3* 30.0* 35.3* 31.9* 33.9*  MCV 94.6  --  99.2 91.9 90.6  PLT 143*  --  161 167 179    Cardiac Enzymes: Recent Labs  Lab 06/24/2018 1933 07/16/18 0118 07/16/18 1217 07/17/18 0218  TROPONINI 0.03* 0.87* 1.48* 0.54*    BNP: Invalid input(s): POCBNP  CBG: Recent Labs  Lab 07/16/18 1639 07/16/18 1954 07/17/18 0009 07/17/18 0410 07/17/18 0712  GLUCAP 128* 121* 110* 116* 104*    Microbiology: Results for orders placed or performed during the hospital encounter of 07/09/2018  Aerobic/Anaerobic Culture (surgical/deep wound)     Status: None (Preliminary result)   Collection Time: 07/17/2018  6:20 PM  Result Value Ref Range  Status   Specimen Description   Final    WOUND RIGHT KNEE Performed at [facility], [place] [address] [place], [place] [address]    Special Requests   Final    NONE Performed at [facility] [address] [place], [place] [address]    Gram Stain   Final    FEW WBC PRESENT, PREDOMINANTLY PMN NO ORGANISMS SEEN Performed at [facility], [place] [address], [place], [place] [address]    Culture PENDING  Incomplete   Report Status PENDING  Incomplete  CULTURE, BLOOD (ROUTINE X 2) w Reflex to ID Panel     Status: None (Preliminary result)   Collection Time: 07/14/2018 10:03 PM  Result Value Ref Range Status   Specimen Description BLOOD RIGHT CENTRAL LINE   Final   Special Requests   Final    BOTTLES DRAWN AEROBIC AND ANAEROBIC Blood Culture results may not be optimal due to an excessive volume of blood received in culture bottles   Culture   Final    NO GROWTH 2 DAYS Performed at [facility], [address], [place], [place] [address]    Report Status  PENDING  Incomplete  Culture, Urine     Status: None (Preliminary result)   Collection Time: 06/28/2018 10:03 PM  Result Value Ref Range Status   Specimen Description   Final    URINE, RANDOM Performed at [facility], [address] [place], [place] [address]    Special Requests   Final    Normal Performed at [facility], [address] [place], [place] [address]    Culture   Final    CULTURE REINCUBATED FOR BETTER GROWTH Performed at [facility], [place] [address] [place], [place] [address]    Report Status PENDING  Incomplete  CULTURE, BLOOD (ROUTINE X 2) w Reflex to ID Panel     Status: None (Preliminary result)   Collection Time: 07/14/2018 10:32 PM  Result Value Ref Range Status   Specimen Description BLOOD BLOOD RIGHT HAND  Final   Special Requests   Final    BOTTLES DRAWN AEROBIC AND ANAEROBIC Blood Culture adequate volume   Culture   Final    NO GROWTH 2 DAYS Performed at [facility], [address] [place],  [address]    Report Status PENDING  Incomplete    Coagulation Studies: Recent Labs    07/14/18 1056  LABPROT 15.2  INR 1.21    Urinalysis: Recent Labs    06/28/2018 2203  COLORURINE YELLOW*  LABSPEC 1.010  PHURINE 9.0*  GLUCOSEU 50*  HGBUR LARGE*  BILIRUBINUR NEGATIVE  KETONESUR NEGATIVE  PROTEINUR 100*  NITRITE NEGATIVE  LEUKOCYTESUR NEGATIVE      Imaging: US Venous Img Lower Bilateral  Result Date: 07/16/2018 CLINICAL DATA:  Bilateral leg swelling EXAM: BILATERAL LOWER EXTREMITY VENOUS DOPPLER ULTRASOUND TECHNIQUE: Gray-scale sonography with graded compression, as well as color Doppler and duplex ultrasound were performed to evaluate the lower extremity deep venous systems from the level of the common femoral vein and including the common femoral, femoral, profunda femoral, popliteal and calf veins including the posterior tibial, peroneal and gastrocnemius veins when visible. The superficial great  saphenous vein was also interrogated. Spectral Doppler was utilized to evaluate flow at rest and with distal augmentation maneuvers in the common femoral, femoral and popliteal veins. COMPARISON:  06/05/2018 FINDINGS: RIGHT LOWER EXTREMITY Common Femoral Vein: No evidence of thrombus. Normal compressibility, respiratory phasicity and  response to augmentation. Linear echogenicity in the right common femoral vein presumably representing central line. Saphenofemoral Junction: No evidence of thrombus. Normal compressibility and flow on color Doppler imaging. Profunda Femoral Vein: No evidence of thrombus. Normal compressibility and flow on color Doppler imaging. Femoral Vein: No evidence of thrombus. Normal compressibility, respiratory phasicity and response to augmentation. Popliteal Vein: No evidence of thrombus. Normal compressibility, respiratory phasicity and response to augmentation. Calf Veins: No evidence of thrombus. Normal compressibility and flow on color Doppler imaging. LEFT LOWER EXTREMITY Common Femoral Vein: No evidence of thrombus. Normal compressibility, respiratory phasicity and response to augmentation. Saphenofemoral Junction: Unable to be evaluated secondary to groin bandage. Profunda Femoral Vein: No evidence of thrombus. Normal compressibility and flow on color Doppler imaging. Femoral Vein: No evidence of thrombus. Normal compressibility, respiratory phasicity and response to augmentation. Popliteal Vein: No evidence of thrombus. Normal compressibility, respiratory phasicity and response to augmentation. Calf Veins: No evidence of thrombus. Normal compressibility and flow on color Doppler imaging. Other Findings:  None. IMPRESSION: No evidence of deep venous thrombosis. Limited evaluation of left saphenofemoral junction due to groin bandage. Electronically Signed   By: Donavan Foil M.D.   On: 07/16/2018 01:15   Dg Chest Port 1 View  Result Date: 07/17/2018 CLINICAL DATA:  Respiratory  failure EXAM: PORTABLE CHEST 1 VIEW COMPARISON:  Yesterday FINDINGS: Endotracheal tube tip just below the clavicular heads. The orogastric tube at least reaches the diaphragm. Artifact from EKG leads. Left pleural effusion and obscured left lower lobe. There is cardiomegaly. Prior median sternotomy for CABG. Transcatheter aortic valve replacement. Prominent right hilum that is vascular based on 2019 chest CT. IMPRESSION: Stable hardware positioning and low lung volumes with left pleural effusion. Electronically Signed   By: Monte Fantasia M.D.   On: 07/17/2018 06:18   Portable Chest Xray  Result Date: 07/16/2018 CLINICAL DATA:  Respiratory failure EXAM: PORTABLE CHEST 1 VIEW COMPARISON:  Yesterday FINDINGS: Endotracheal tube tip at the clavicular heads. The orogastric tube at least reaches the stomach. Cardiomegaly with CABG and transcatheter aortic valve replacement. Small left pleural effusion. Right hilar enlargement that is vascular based on December 2019 chest CT. IMPRESSION: 1. Unremarkable hardware positioning. 2. Stable cardiomegaly and small left effusion. Electronically Signed   By: Monte Fantasia M.D.   On: 07/16/2018 06:40   Portable Chest X-ray  Result Date: 07/03/2018 CLINICAL DATA:  78 year old male status post intubation. EXAM: PORTABLE CHEST 1 VIEW COMPARISON:  Chest radiograph dated 06/27/2018 FINDINGS: An endotracheal tube is noted with tip approximately 5 cm above the carina. An enteric tube extends into the left upper abdomen with tip and side-port the region of the body of the stomach. Left-sided pleural effusion and left lung base atelectasis or infiltrate. Overall slight the improved aeration of the left lung compared to the prior radiograph. There is cardiomegaly with bilateral hilar vascular prominence and congestion. No pneumothorax. Median sternotomy wires, CABG vascular clips, and aortic valve repair. No bowel dilatation in the visualized abdomen. Osteopenia with scoliosis and  degenerative changes of the spine. IMPRESSION: 1. Endotracheal tube above the carina and enteric tube within the stomach. 2. Cardiomegaly with vascular congestion. 3. Slight interval improvement in aeration of the left lung and slight decrease in left-sided pleural effusion and left lung base atelectasis or infiltrate. Electronically Signed   By: Anner Crete M.D.   On: 07/24/2018 20:50   Dg Abd Portable 1v  Result Date: 07/14/2018 CLINICAL DATA:  78 year old male status post intubation. EXAM: PORTABLE CHEST  1 VIEW COMPARISON:  Chest radiograph dated 06/27/2018 FINDINGS: An endotracheal tube is noted with tip approximately 5 cm above the carina. An enteric tube extends into the left upper abdomen with tip and side-port the region of the body of the stomach. Left-sided pleural effusion and left lung base atelectasis or infiltrate. Overall slight the improved aeration of the left lung compared to the prior radiograph. There is cardiomegaly with bilateral hilar vascular prominence and congestion. No pneumothorax. Median sternotomy wires, CABG vascular clips, and aortic valve repair. No bowel dilatation in the visualized abdomen. Osteopenia with scoliosis and degenerative changes of the spine. IMPRESSION: 1. Endotracheal tube above the carina and enteric tube within the stomach. 2. Cardiomegaly with vascular congestion. 3. Slight interval improvement in aeration of the left lung and slight decrease in left-sided pleural effusion and left lung base atelectasis or infiltrate. Electronically Signed   By: Anner Crete M.D.   On: 07/07/2018 20:50     Medications:   . ceFEPime (MAXIPIME) IV Stopped (07/17/18 0531)  . DOPamine 5 mcg/kg/min (07/16/18 2334)  . feeding supplement (NEPRO CARB STEADY)    . fentaNYL infusion INTRAVENOUS 75 mcg/hr (07/17/18 0800)  . norepinephrine (LEVOPHED) Adult infusion 4 mcg/min (07/17/18 0800)  . propofol (DIPRIVAN) infusion 17 mcg/kg/min (07/17/18 0800)  . pureflow 3,000  mL/hr at 07/17/18 0707   . B-complex with vitamin C  1 tablet Per Tube Daily  . chlorhexidine gluconate (MEDLINE KIT)  15 mL Mouth Rinse BID  . famotidine  20 mg Per Tube Daily  . feeding supplement (PRO-STAT SUGAR FREE 64)  30 mL Per Tube BID  . free water  30 mL Per Tube Q4H  . heparin injection (subcutaneous)  5,000 Units Subcutaneous Q8H  . hydrocortisone sod succinate (SOLU-CORTEF) inj  50 mg Intravenous Q6H  . insulin aspart  0-15 Units Subcutaneous Q4H  . mouth rinse  15 mL Mouth Rinse 10 times per day  . vitamin C  250 mg Per Tube BID   fentaNYL, heparin, ipratropium-albuterol  Assessment/ Plan:  [name] is a 78 y.o. white male with ESRD, hypothyroidism, hypertension, BPH, CAD status post CABG, hyperlipidemia, right total knee revision arthroplasty May 22, 2018 Dr. Marry Guan. Wound dehiscence on 1/22. Cardiac arrest post procedure.   [address]/[facility]/MWF  1. End-stage renal disease: with hyperkalemia hemodynamically unstable for intermittent hemodialysis.  - CVVHD. Start ultrafiltration today.   2. Cardiogenic shock with acute on chronic systolic congestive heart failure Respiratory failure requiring mechanical ventilation.  - Requiring vasopressors.  - stress dose steroids  3. Lactic acidosis - empiric vanco and cefepime.   4. Anemia of chronic kidney disease: hemoglobin 10.2 - No indication for ESA    LOS: 2 Jimmy Carson 1/24/202010:15 AM

## 2018-07-17 NOTE — Progress Notes (Signed)
ORTHOPAEDICS PROGRESS NOTE  PATIENT NAME: Jimmy Carson DOB: [birth year]  MRN: [id number]  POD # 2: I&D right knee  Subjective: Still intubated and on pressors. Sedated appropriately.  Objective: Vital signs in last 24 hours: Temp:  [97.7 F (36.5 C)-99.3 F (37.4 C)] 99.3 F (37.4 C) (01/24 0415) Pulse Rate:  [49-87] 59 (01/24 0630) Resp:  [0-21] 14 (01/24 0630) BP: (89-148)/(52-72) 119/62 (01/24 0630) SpO2:  [96 %-100 %] 99 % (01/24 0630) FiO2 (%):  [40 %] 40 % (01/24 0311) Weight:  [75.6 kg-79.4 kg] 79.4 kg (01/24 0220)  Intake/Output from previous day: 01/23 0701 - 01/24 0700 In: 1379.9 [I.V.:779.9; IV Piggyback:600] Out: 0   Recent Labs    07/14/18 1056 07/02/2018 1543 06/27/2018 1933  07/16/18 0536  07/17/18 0218 07/17/18 0407 07/17/18 0620  WBC 5.4  --  5.8  --  6.8  --   --  9.2  --   HGB 8.3* 10.2* 9.9*  --  9.5*  --   --  10.2*  --   HCT 28.3* 30.0* 35.3*  --  31.9*  --   --  33.9*  --   PLT 143*  --  161  --  167  --   --  179  --   K 3.9 4.8 4.4   < >  --    < > 4.0  --  3.9  CL 92*  --  95*   < >  --    < > 98  --  98  CO2 34*  --  23   < >  --    < > 26  --  25  BUN 30*  --  40*   < >  --    < > 33*  --  28*  CREATININE 4.54*  --  5.94*   < >  --    < > 3.67*  --  3.22*  GLUCOSE 86 82 106*   < >  --    < > 124*  --  120*  CALCIUM 7.7*  --  7.5*   < >  --    < > 8.3*  --  8.1*  INR 1.21  --   --   --   --   --   --   --   --    < > = values in this interval not displayed.    EXAM General: Sedated  Lungs: Decfreased breath sounds to the .l base. No wheezes. Cardiac: normal rate and regular rhythm; 3/6 murmur Right lower extremity: Scant bloody spotting on dressing. No erythema. Mild soft tissue swelling. No effusion.  Wound cx pending. Gram stain with few WBCs, no organisms.  Assessment: S/p I&D of right knee  Plan: Dialysis as per Nephrology. Pressors as per intensivist. TED hose ordered. DVT Prophylaxis - TED hose and SCD, and  heparin  James P. Holley Bouche M.D.

## 2018-07-17 NOTE — Progress Notes (Signed)
PULMONARY / CRITICAL CARE MEDICINE   Name: Jimmy Carson MRN: [id number] DOB: [birth year]    ADMISSION DATE:  07/21/2018 CONSULTATION DATE:  07/04/2018  REFERRING MD:  Dr. Marry Guan  CHIEF COMPLAINT:  Severe Hypotension  PT PROFILE: 31 M who was undergoing elective I&D of Right knee wound, as he was being closed he had episodes of hypotension that did not respond to pressors.  CODE BLUE was called by Anesthesia, pt did not lose pulse, but he did receive approximately 4 minutes of CPR to help with perfusion and 2 doses of Epinephrine.  He returns to ICU post code, intubated.  He is admitted for treatment of Cardiogenic shock +/- Septic shock.   SUBJECTIVE:  Remains intubated and sedated.  Again agitated on WUA but F/C per RN  VITAL SIGNS: BP (!) 64/48   Pulse 72   Temp (!) 97.5 F (36.4 C)   Resp 14   Ht 5\' 10"  (1.778 m)   Wt 79.4 kg   SpO2 96%   BMI 25.12 kg/m   HEMODYNAMICS:    VENTILATOR SETTINGS: Vent Mode: PRVC FiO2 (%):  [40 %] 40 % Set Rate:  [14 bmp-18 bmp] 14 bmp Vt Set:  [500 mL] 500 mL PEEP:  [5 cmH20] 5 cmH20 Plateau Pressure:  [17 cmH20-19 cmH20] 19 cmH20  INTAKE / OUTPUT: I/O last 3 completed shifts: In: 2474.6 [I.V.:1631.4; IV Piggyback:843.2] Out: 15 [Urine:10; Blood:5]  PHYSICAL EXAMINATION: General: RASS -4, not F/C, intubated, sedated Neuro:  CNs intact, MAEs HEENT: NCAT, sclerae white NECK: No JVD noted Cardiovascular: Irregular, soft blowing systolic M Lungs: Breath sounds clear anteriorly Abdomen: Hypoactive BS, soft, NT Ext: cool, symmetric bilateral pretibial edema Skin: no lesions noted  LABS:  BMET Recent Labs  Lab 07/17/18 0620 07/17/18 1038 07/17/18 1453  NA 134* 135 136  K 3.9 3.8 4.2  CL 98 100 102  CO2 25 25 27   BUN 28* 25* 26*  CREATININE 3.22* 2.96* 2.80*  GLUCOSE 120* 118* 124*    Electrolytes Recent Labs  Lab 07/17/18 0620 07/17/18 1038 07/17/18 1453  CALCIUM 8.1* 8.2* 8.2*  MG 2.2 2.0 2.0  PHOS 4.0  4.2 4.7*    CBC Recent Labs  Lab 07/12/2018 1933 07/16/18 0536 07/17/18 0407  WBC 5.8 6.8 9.2  HGB 9.9* 9.5* 10.2*  HCT 35.3* 31.9* 33.9*  PLT 161 167 179    Coag's Recent Labs  Lab 07/14/18 1056 07/17/18 0407  APTT  --  42*  INR 1.21  --     Sepsis Markers Recent Labs  Lab 07/10/2018 1933 06/28/2018 1936 07/11/2018 2203 07/16/18 0448 07/17/18 0407  LATICACIDVEN  --  5.5* 3.1*  --   --   PROCALCITON 0.46  --   --  12.68 15.41    ABG Recent Labs  Lab 07/06/2018 1930 07/16/18 0500  PHART 7.29* 7.48*  PCO2ART 44 37  PO2ART 302* 177*    Liver Enzymes Recent Labs  Lab 07/24/2018 1933 07/16/18 0448  07/17/18 0620 07/17/18 1038 07/17/18 1453  AST 43* 76*  --   --   --   --   ALT 19 39  --   --   --   --   ALKPHOS 89 114  --   --   --   --   BILITOT 0.9 1.0  --   --   --   --   ALBUMIN 2.7* 2.5*   < > 2.5* 2.6* 2.5*   < > = values in this interval not  displayed.    Cardiac Enzymes Recent Labs  Lab 07/16/18 0118 07/16/18 1217 07/17/18 0218  TROPONINI 0.87* 1.48* 0.54*    Glucose Recent Labs  Lab 07/16/18 1639 07/16/18 1954 07/17/18 0009 07/17/18 0410 07/17/18 0712 07/17/18 1142  GLUCAP 128* 121* 110* 116* 104* 115*    CXR: No significant change    STUDIES:  Echocardiogram 1/23: LVEF 40-45%.  Mild MR.  Status post AoVR Venous Ultrasound bilateral LE 1/23>> No evidence of deep venous thrombosis. Limited evaluation of left saphenofemoral junction due to groin bandage   CULTURES: Wound R Knee 1/22 >> rare Pseudomonas Blood 1/22 >>  Urine 1/22>> 10k enterococcus  ANTIBIOTICS: Clindamycin 1/22 >> 1/23 Vancomycin 1/22 >> 1/23 Cefepime 1/22>>  SIGNIFICANT EVENTS: 1/22>> Elective I&D of Right knee 1/22>> Hypotension at end of procedure, required transfer to ICU  LINES/TUBES: ETT 1/22>> Right Femoral CVC 1/22>> L femoral HD cath 1/23 >>   ASSESSMENT / PLAN:  PULMONARY A: VDRF post R knee surgery c/b brief cardiac arrest Chronic L  pleural disease - no indication for thoracentesis P:   Cont vent support - settings reviewed and/or adjusted Cont vent bundle Daily SBT if/when meets criteria  CARDIOVASCULAR A:  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 New LBBB pattern on EKG S/P bradycardic arrest in OR Cardiogenic Shock +/- Septic Shock Acute on Chronic Systolic CHF  Mildly elevated Trop I, likely demand ischemia P:  Cardiology following Echocardiogram results pending Cont wean dopamine to off as permitted for HR > 19/JYN Cont wean NE to off as tolerated for MAP > 60 mmHg Continue empiric stress dose steroids  RENAL A:   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 Hyperkalemia, resolved P:  Monitor BMET intermittently Monitor I/Os Correct electrolytes as indicated Nephrology following Continue CRRT per nephrology  GASTROINTESTINAL A:   No acute issues P:   SUP: Enteral famotidine Initiate TF protocol 1/24  HEMATOLOGIC A:   Anemia of chronic disease, no overt bleeding P:  DVT px: SQ heparin Monitor CBC intermittently Transfuse per usual guidelines  INFECTIOUS A:   Elevated procalcitonin Severe sepsis P:   Monitor temp, WBC count Micro and abx as above  ENDOCRINE A:   Steroid - induced hyperglycemia, controlled P:   Cont SSI protocol  NEUROLOGIC A:   Acute Metabolic Encephalopathy Vent/ICU/post op discomfort P:   RASS goal: -1, -2 Continue PAD protocol, fentanyl infusion Daily WUA   FAMILY  Wife updated in detail @ bedside  CCM time: 35 mins The above time includes time spent in consultation with patient and/or family members and reviewing care plan on multidisciplinary rounds  Merton Border, MD PCCM service Mobile [phone] Pager [phone]  07/17/2018, 4:05 PM

## 2018-07-17 NOTE — Progress Notes (Signed)
Pharmacy Antibiotic Note  Jimmy Carson is a 78 y.o. male admitted on 07/05/2018 with sepsis. Pt was undergoing I&D procedure where sustained hypotension and CPR was performed. PMH includes ESRD, polycystic kidney disease, prostate cancer, and anemia.  Pharmacy has been consulted for cefepime dosing.  Plan: Patient is on CRRT. Vancomycin discontinued prior to ICU rounds.   Continue cefepime 2g IV q8h.  Height: 5\' 10"  (177.8 cm) Weight: 175 lb 0.7 oz (79.4 kg) IBW/kg (Calculated) : 73  Temp (24hrs), Avg:96.3 F (35.7 C), Min:93 F (33.9 C), Max:99.3 F (37.4 C)  Recent Labs  Lab 07/14/18 1056 07/21/2018 1933 07/14/2018 1936 07/18/2018 2203  07/16/18 0536  07/16/18 2224 07/17/18 0218 07/17/18 0407 07/17/18 0620 07/17/18 1038 07/17/18 1453  WBC 5.4 5.8  --   --   --  6.8  --   --   --  9.2  --   --   --   CREATININE 4.54* 5.94*  --   --    < >  --    < > 3.70* 3.67*  --  3.22* 2.96* 2.80*  LATICACIDVEN  --   --  5.5* 3.1*  --   --   --   --   --   --   --   --   --    < > = values in this interval not displayed.    Estimated Creatinine Clearance: 23.2 mL/min (A) (by C-G formula based on SCr of 2.8 mg/dL (H)).    Allergies  Allergen Reactions  . Orange Fruit [Citrus] Hives  . Penicillins Hives and Other (See Comments)    Has patient had a PCN reaction causing immediate rash, facial/tongue/throat swelling, SOB or lightheadedness with hypotension: Yes Has patient had a PCN reaction causing severe rash involving mucus membranes or skin necrosis: No Has patient had a PCN reaction that required hospitalization No Has patient had a PCN reaction occurring within the last 10 years: No If all of the above answers are "NO", then may proceed with Cephalosporin use.     Antimicrobials this admission: Gentamicin 1/22 x 1 Clindamycin 1/22  >> 1/23 Vancomycin 1/22 >> 1/23 Cefepime 1/22 >>   Dose adjustments this admission: 1/23 cefepime adjusted to 2g q8h for CRRT 1/23  vancomycin adjusted to 1g q24h for CRRT  Microbiology results: 1/22 BCx: no growth x 2 1/22 UCx: 10K Enterococcus Faecalis   Thank you for allowing pharmacy to be a part of this patient's care.  Simpson,Michael L 07/17/2018 3:33 PM

## 2018-07-18 ENCOUNTER — Inpatient Hospital Stay: Payer: Medicare Other

## 2018-07-18 DIAGNOSIS — M00861 Arthritis due to other bacteria, right knee: Secondary | ICD-10-CM

## 2018-07-18 LAB — RENAL FUNCTION PANEL
ANION GAP: 6 (ref 5–15)
Albumin: 2.5 g/dL — ABNORMAL LOW (ref 3.5–5.0)
Albumin: 2.5 g/dL — ABNORMAL LOW (ref 3.5–5.0)
Albumin: 2.5 g/dL — ABNORMAL LOW (ref 3.5–5.0)
Albumin: 2.6 g/dL — ABNORMAL LOW (ref 3.5–5.0)
Albumin: 2.3 g/dL — ABNORMAL LOW (ref 3.5–5.0)
Albumin: 2.3 g/dL — ABNORMAL LOW (ref 3.5–5.0)
Anion gap: 7 (ref 5–15)
Anion gap: 6 (ref 5–15)
Anion gap: 8 (ref 5–15)
Anion gap: 7 (ref 5–15)
Anion gap: 8 (ref 5–15)
BUN: 24 mg/dL — ABNORMAL HIGH (ref 8–23)
BUN: 25 mg/dL — ABNORMAL HIGH (ref 8–23)
BUN: 23 mg/dL (ref 8–23)
BUN: 24 mg/dL — AB (ref 8–23)
BUN: 23 mg/dL (ref 8–23)
BUN: 24 mg/dL — ABNORMAL HIGH (ref 8–23)
CALCIUM: 8.4 mg/dL — AB (ref 8.9–10.3)
CHLORIDE: 100 mmol/L (ref 98–111)
CHLORIDE: 100 mmol/L (ref 98–111)
CO2: 28 mmol/L (ref 22–32)
CO2: 28 mmol/L (ref 22–32)
CO2: 27 mmol/L (ref 22–32)
CO2: 26 mmol/L (ref 22–32)
CO2: 28 mmol/L (ref 22–32)
CO2: 27 mmol/L (ref 22–32)
CREATININE: 1.99 mg/dL — AB (ref 0.61–1.24)
Calcium: 8.3 mg/dL — ABNORMAL LOW (ref 8.9–10.3)
Calcium: 8.4 mg/dL — ABNORMAL LOW (ref 8.9–10.3)
Calcium: 8.7 mg/dL — ABNORMAL LOW (ref 8.9–10.3)
Calcium: 8.8 mg/dL — ABNORMAL LOW (ref 8.9–10.3)
Calcium: 8.8 mg/dL — ABNORMAL LOW (ref 8.9–10.3)
Chloride: 100 mmol/L (ref 98–111)
Chloride: 101 mmol/L (ref 98–111)
Chloride: 100 mmol/L (ref 98–111)
Chloride: 100 mmol/L (ref 98–111)
Creatinine, Ser: 2.35 mg/dL — ABNORMAL HIGH (ref 0.61–1.24)
Creatinine, Ser: 2.41 mg/dL — ABNORMAL HIGH (ref 0.61–1.24)
Creatinine, Ser: 2.04 mg/dL — ABNORMAL HIGH (ref 0.61–1.24)
Creatinine, Ser: 1.85 mg/dL — ABNORMAL HIGH (ref 0.61–1.24)
Creatinine, Ser: 1.84 mg/dL — ABNORMAL HIGH (ref 0.61–1.24)
GFR calc Af Amer: 30 mL/min — ABNORMAL LOW (ref >60)
GFR calc Af Amer: 29 mL/min — ABNORMAL LOW (ref >60)
GFR calc Af Amer: 36 mL/min — ABNORMAL LOW (ref >60)
GFR calc Af Amer: 37 mL/min — ABNORMAL LOW (ref >60)
GFR calc Af Amer: 40 mL/min — ABNORMAL LOW (ref >60)
GFR calc Af Amer: 40 mL/min — ABNORMAL LOW (ref >60)
GFR calc non Af Amer: 26 mL/min — ABNORMAL LOW (ref >60)
GFR calc non Af Amer: 25 mL/min — ABNORMAL LOW (ref >60)
GFR calc non Af Amer: 31 mL/min — ABNORMAL LOW (ref >60)
GFR calc non Af Amer: 32 mL/min — ABNORMAL LOW (ref >60)
GFR calc non Af Amer: 35 mL/min — ABNORMAL LOW (ref >60)
GFR, EST NON AFRICAN AMERICAN: 35 mL/min — AB (ref >60)
GLUCOSE: 140 mg/dL — AB (ref 70–99)
Glucose, Bld: 154 mg/dL — ABNORMAL HIGH (ref 70–99)
Glucose, Bld: 159 mg/dL — ABNORMAL HIGH (ref 70–99)
Glucose, Bld: 146 mg/dL — ABNORMAL HIGH (ref 70–99)
Glucose, Bld: 140 mg/dL — ABNORMAL HIGH (ref 70–99)
Glucose, Bld: 145 mg/dL — ABNORMAL HIGH (ref 70–99)
PHOSPHORUS: 3.8 mg/dL (ref 2.5–4.6)
Phosphorus: 4.4 mg/dL (ref 2.5–4.6)
Phosphorus: 4.2 mg/dL (ref 2.5–4.6)
Phosphorus: 3.5 mg/dL (ref 2.5–4.6)
Phosphorus: 3.2 mg/dL (ref 2.5–4.6)
Phosphorus: 2.9 mg/dL (ref 2.5–4.6)
Potassium: 4 mmol/L (ref 3.5–5.1)
Potassium: 3.9 mmol/L (ref 3.5–5.1)
Potassium: 3.7 mmol/L (ref 3.5–5.1)
Potassium: 3.4 mmol/L — ABNORMAL LOW (ref 3.5–5.1)
Potassium: 3.4 mmol/L — ABNORMAL LOW (ref 3.5–5.1)
Potassium: 3.3 mmol/L — ABNORMAL LOW (ref 3.5–5.1)
Sodium: 135 mmol/L (ref 135–145)
Sodium: 134 mmol/L — ABNORMAL LOW (ref 135–145)
Sodium: 134 mmol/L — ABNORMAL LOW (ref 135–145)
Sodium: 134 mmol/L — ABNORMAL LOW (ref 135–145)
Sodium: 135 mmol/L (ref 135–145)
Sodium: 135 mmol/L (ref 135–145)

## 2018-07-18 LAB — GLUCOSE, CAPILLARY
GLUCOSE-CAPILLARY: 146 mg/dL — AB (ref 70–99)
GLUCOSE-CAPILLARY: 142 mg/dL — AB (ref 70–99)
Glucose-Capillary: 120 mg/dL — ABNORMAL HIGH (ref 70–99)
Glucose-Capillary: 128 mg/dL — ABNORMAL HIGH (ref 70–99)
Glucose-Capillary: 140 mg/dL — ABNORMAL HIGH (ref 70–99)
Glucose-Capillary: 142 mg/dL — ABNORMAL HIGH (ref 70–99)

## 2018-07-18 LAB — MAGNESIUM
MAGNESIUM: 2 mg/dL (ref 1.7–2.4)
Magnesium: 2.2 mg/dL (ref 1.7–2.4)
Magnesium: 2.1 mg/dL (ref 1.7–2.4)
Magnesium: 2.2 mg/dL (ref 1.7–2.4)
Magnesium: 2.1 mg/dL (ref 1.7–2.4)
Magnesium: 2 mg/dL (ref 1.7–2.4)

## 2018-07-18 LAB — URINE CULTURE
Culture: 10000 — AB
SPECIAL REQUESTS: NORMAL

## 2018-07-18 LAB — CBC
HCT: 34.5 % — ABNORMAL LOW (ref 39.0–52.0)
Hemoglobin: 10.2 g/dL — ABNORMAL LOW (ref 13.0–17.0)
MCH: 27.6 pg (ref 26.0–34.0)
MCHC: 29.6 g/dL — ABNORMAL LOW (ref 30.0–36.0)
MCV: 93.2 fL (ref 80.0–100.0)
Platelets: 257 10*3/uL (ref 150–400)
RBC: 3.7 MIL/uL — ABNORMAL LOW (ref 4.22–5.81)
RDW: 18.6 % — ABNORMAL HIGH (ref 11.5–15.5)
WBC: 12.9 10*3/uL — ABNORMAL HIGH (ref 4.0–10.5)
nRBC: 0 % (ref 0.0–0.2)

## 2018-07-18 LAB — TRIGLYCERIDES: Triglycerides: 88 mg/dL (ref <150)

## 2018-07-18 MED ORDER — NEPRO/CARBSTEADY PO LIQD
1000.0000 mL | ORAL | Status: DC
  Administered 2018-07-18: 1000 mL

## 2018-07-18 MED ORDER — FENTANYL CITRATE (PF) 100 MCG/2ML IJ SOLN
100.0000 ug | INTRAMUSCULAR | Status: DC | PRN
  Filled 2018-07-18: qty 2

## 2018-07-18 MED ORDER — FENTANYL CITRATE (PF) 100 MCG/2ML IJ SOLN
100.0000 ug | INTRAMUSCULAR | Status: DC | PRN
  Administered 2018-07-18 – 2018-07-21 (×9): 100 ug via INTRAVENOUS
  Filled 2018-07-18 (×9): qty 2

## 2018-07-18 MED ORDER — HYDROCORTISONE NA SUCCINATE PF 100 MG IJ SOLR
50.0000 mg | Freq: Three times a day (TID) | INTRAMUSCULAR | Status: DC
  Administered 2018-07-18 – 2018-07-21 (×9): 50 mg via INTRAVENOUS
  Filled 2018-07-18 (×9): qty 2

## 2018-07-18 MED ORDER — PROPOFOL 1000 MG/100ML IV EMUL
5.0000 ug/kg/min | INTRAVENOUS | Status: DC
  Administered 2018-07-18: 35 ug/kg/min via INTRAVENOUS
  Administered 2018-07-18 (×2): 20 ug/kg/min via INTRAVENOUS
  Administered 2018-07-19: 45 ug/kg/min via INTRAVENOUS
  Administered 2018-07-19: 35 ug/kg/min via INTRAVENOUS
  Administered 2018-07-19: 45 ug/kg/min via INTRAVENOUS
  Administered 2018-07-19: 40 ug/kg/min via INTRAVENOUS
  Administered 2018-07-20: 60 ug/kg/min via INTRAVENOUS
  Administered 2018-07-20 (×3): 50 ug/kg/min via INTRAVENOUS
  Administered 2018-07-21: 60 ug/kg/min via INTRAVENOUS
  Administered 2018-07-21: 70 ug/kg/min via INTRAVENOUS
  Filled 2018-07-18 (×15): qty 100

## 2018-07-18 NOTE — Progress Notes (Signed)
Bair hugger in place to maintain temperature above 97 degrees.  Tolerating CRRT.  Remains on levophed drip and dopamine drip.

## 2018-07-18 NOTE — Progress Notes (Signed)
Pharmacy Antibiotic Note  Jimmy Carson is a 78 y.o. male admitted on 07/17/2018 with sepsis. Pt was undergoing I&D procedure where sustained hypotension and CPR was performed. PMH includes ESRD, polycystic kidney disease, prostate cancer, and anemia.  Pharmacy has been consulted for cefepime dosing.  Vancomycin DCd 1/24.  CRRT started 1/23   Plan: Continue cefepime 2g IV q8h (CRRT dosing)   Height: 5\' 10"  (177.8 cm) Weight: 179 lb 14.3 oz (81.6 kg) IBW/kg (Calculated) : 73  Temp (24hrs), Avg:97.1 F (36.2 C), Min:95.5 F (35.3 C), Max:98.1 F (36.7 C)  Recent Labs  Lab 07/14/18 1056 07/08/2018 1933 06/27/2018 1936 07/02/2018 2203  07/16/18 0536  07/17/18 0407  07/17/18 1823 07/17/18 2323 07/18/18 0232 07/18/18 0600 07/18/18 0944  WBC 5.4 5.8  --   --   --  6.8  --  9.2  --   --   --   --  12.9*  --   CREATININE 4.54* 5.94*  --   --    < >  --    < >  --    < > 2.63* 2.53* 2.35* 2.41* 2.04*  LATICACIDVEN  --   --  5.5* 3.1*  --   --   --   --   --   --   --   --   --   --    < > = values in this interval not displayed.    Estimated Creatinine Clearance: 31.8 mL/min (A) (by C-G formula based on SCr of 2.04 mg/dL (H)).    Allergies  Allergen Reactions  . Orange Fruit [Citrus] Hives  . Penicillins Hives and Other (See Comments)    Has patient had a PCN reaction causing immediate rash, facial/tongue/throat swelling, SOB or lightheadedness with hypotension: Yes Has patient had a PCN reaction causing severe rash involving mucus membranes or skin necrosis: No Has patient had a PCN reaction that required hospitalization No Has patient had a PCN reaction occurring within the last 10 years: No If all of the above answers are "NO", then may proceed with Cephalosporin use.     Antimicrobials this admission: Gentamicin 1/22 x 1 Clindamycin 1/22  >> 1/23 Vancomycin 1/22 >> 1/23 Cefepime 1/22 >>   Dose adjustments this admission: 1/23 cefepime adjusted to 2g q8h for  CRRT 1/23 vancomycin adjusted to 1g q24h for CRRT  Microbiology results: 1/22 BCx: no growth x 2 1/22 UCx: 10K Enterococcus Faecalis  Knee Wound Cx: Pseudomonas   Thank you for allowing pharmacy to be a part of this patient's care.  Pernell Dupre, PharmD, BCPS Clinical Pharmacist 07/18/2018 12:26 PM

## 2018-07-18 NOTE — Progress Notes (Signed)
Asked Patria Mane, NP about need for daily aptt and NP gave order to discontinue this lab order.

## 2018-07-18 NOTE — Progress Notes (Signed)
Pharmacy CRRT Monitoring Consult:  Pharmacy consulted to assist in monitoring and adjusting medications while on CRRT in this 78 y.o. male admitted on 07/21/2018 with cardiogenic and possible septic shock s/p elective I&D of right knee wound.   Assessment/Plan: Antibiotics have been adjusted. No further adjustments warranted.   Pharmacy will continue to monitor and adjust per consult.   Pernell Dupre, PharmD, BCPS Clinical Pharmacist 07/18/2018 12:25 PM

## 2018-07-18 NOTE — Progress Notes (Signed)
PULMONARY / CRITICAL CARE MEDICINE   Name: Jimmy Carson MRN: [id number] DOB: 03/26/1941    ADMISSION DATE:  07/24/2018 CONSULTATION DATE:  07/05/2018  REFERRING MD:  Dr. Marry Guan  CHIEF COMPLAINT: Brief cardiac arrest in OR  PT PROFILE: 51 M was undergoing elective I&D of Right knee wound.  At end of case, suffered bradycardia requiring brief CPR with persistent hypotension after resuscitation.  Remained intubated postprocedure and transferred to ICU  SUBJECTIVE:  Remains intubated, sedated.  Synchronous with vent.  Intermittent agitation.  Follows commands on WUA per RN  VITAL SIGNS: BP 113/64   Pulse 76   Temp (!) 97.2 F (36.2 C)   Resp 14   Ht 5\' 10"  (1.778 m)   Wt 81.6 kg   SpO2 98%   BMI 25.81 kg/m   HEMODYNAMICS:    VENTILATOR SETTINGS: Vent Mode: PSV FiO2 (%):  [35 %-40 %] 35 % Set Rate:  [14 bmp] 14 bmp Vt Set:  [500 mL] 500 mL PEEP:  [5 cmH20] 5 cmH20 Pressure Support:  [10 cmH20] 10 cmH20 Plateau Pressure:  [19 cmH20-20 cmH20] 20 cmH20  INTAKE / OUTPUT: I/O last 3 completed shifts: In: 2926 [I.V.:1399.5; NG/GT:1025; IV Piggyback:501.5] Out: 950 [Other:950]  PHYSICAL EXAMINATION: General: RASS -3, not F/C for me, intubated, sedated Neuro: CNs intact, moves all extremities HEENT: NCAT, sclerae white NECK: No JVD noted Cardiovascular: Regular with occasional extrasystoles, soft blowing systolic murmur Lungs: No wheezes or rhonchi Abdomen: Abdomen soft, + BS, tolerating TF's Ext: Symmetric bilateral pretibial, ankle and pedal edema Skin: no lesions noted  LABS:  BMET Recent Labs  Lab 07/18/18 0232 07/18/18 0600 07/18/18 0944  NA 135 134* 134*  K 4.0 3.9 3.7  CL 100 100 101  CO2 28 28 27   BUN 24* 25* 23  CREATININE 2.35* 2.41* 2.04*  GLUCOSE 154* 159* 146*    Electrolytes Recent Labs  Lab 07/18/18 0232 07/18/18 0600 07/18/18 0944  CALCIUM 8.3* 8.4* 8.4*  MG 2.2 2.0 2.1  PHOS 4.4 4.2 3.8    CBC Recent Labs  Lab  07/16/18 0536 07/17/18 0407 07/18/18 0600  WBC 6.8 9.2 12.9*  HGB 9.5* 10.2* 10.2*  HCT 31.9* 33.9* 34.5*  PLT 167 179 257    Coag's Recent Labs  Lab 07/14/18 1056 07/17/18 0407  APTT  --  42*  INR 1.21  --     Sepsis Markers Recent Labs  Lab 06/26/2018 1933 07/02/2018 1936 07/13/2018 2203 07/16/18 0448 07/17/18 0407  LATICACIDVEN  --  5.5* 3.1*  --   --   PROCALCITON 0.46  --   --  12.68 15.41    ABG Recent Labs  Lab 07/10/2018 1930 07/16/18 0500  PHART 7.29* 7.48*  PCO2ART 44 37  PO2ART 302* 177*    Liver Enzymes Recent Labs  Lab 07/11/2018 1933 07/16/18 0448  07/18/18 0232 07/18/18 0600 07/18/18 0944  AST 43* 76*  --   --   --   --   ALT 19 39  --   --   --   --   ALKPHOS 89 114  --   --   --   --   BILITOT 0.9 1.0  --   --   --   --   ALBUMIN 2.7* 2.5*   < > 2.5* 2.5* 2.5*   < > = values in this interval not displayed.    Cardiac Enzymes Recent Labs  Lab 07/16/18 0118 07/16/18 1217 07/17/18 0218  TROPONINI 0.87* 1.48* 0.54*  Glucose Recent Labs  Lab 07/17/18 1142 07/17/18 1934 07/17/18 2325 07/18/18 0327 07/18/18 0743 07/18/18 1155  GLUCAP 115* 116* 105* 146* 142* 142*    CXR: No significant change    STUDIES:  Echocardiogram 1/23: LVEF 40-45%.  Mild MR.  Status post AoVR Venous Ultrasound bilateral LE 1/23>> No evidence of deep venous thrombosis. Limited evaluation of left saphenofemoral junction due to groin bandage   CULTURES: Wound R Knee 1/22 >> rare Pseudomonas, pansensitive Blood 1/22 >> NEG Urine 1/22>> 10k enterococcus  ANTIBIOTICS: Clindamycin 1/22 >> 1/23 Vancomycin 1/22 >> 1/23 Cefepime 1/22 >>   SIGNIFICANT EVENTS: 1/22>> Elective I&D of Right knee 1/22>> Hypotension at end of procedure, required transfer to ICU  LINES/TUBES: ETT 1/22>> Right Femoral CVC 1/22>> L femoral HD cath 1/23 >>   ASSESSMENT / PLAN:  PULMONARY A: VDRF post R knee surgery c/b brief cardiac arrest Chronic L pleural disease -  no indication for thoracentesis P:   Cont vent support - settings reviewed and/or adjusted Wean in PSV mode as tolerated Cont vent bundle Daily SBT if/when meets criteria   CARDIOVASCULAR A:  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 New LBBB pattern on EKG S/P bradycardic arrest in OR 1/22 Shock, cardiogenic + septic  Mildly elevated Trop I, likely demand ischemia P:  Cardiology following Echocardiogram results pending Try to get off dopamine today and use only NE as needed for BP and HR Continue empiric stress dose steroids, dose reduced 1/25  RENAL A:   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 Hyperkalemia, resolved P:  Monitor BMET intermittently Monitor I/Os Correct electrolytes as indicated Nephrology following Continue CRRT per nephrology UF rate increased by nephrology 1/25  GASTROINTESTINAL A:   No acute issues P:   SUP: Enteral famotidine Continue TF protocol initiated 1/24  HEMATOLOGIC A:   Anemia of chronic disease, no overt bleeding P:  DVT px: SQ heparin Monitor CBC intermittently Transfuse per usual guidelines  INFECTIOUS A:   Elevated procalcitonin Severe sepsis Septic right knee arthritis, Pseudomonas P:   Monitor temp, WBC count Micro and abx as above  ENDOCRINE A:   Steroid - induced hyperglycemia, controlled P:   Cont SSI protocol  NEUROLOGIC A:   Acute Metabolic Encephalopathy Vent/ICU/post op discomfort P:   RASS goal: -1, -2 Continue PAD protocol, propofol infusion + intermittent fentanyl Daily WUA   FAMILY  No family at bedside  CCM time: 35 mins The above time includes time spent in consultation with patient and/or family members and reviewing care plan on multidisciplinary rounds  Merton Border, MD PCCM service Mobile [phone] Pager [phone]  07/18/2018, 12:48 PM

## 2018-07-18 NOTE — Progress Notes (Signed)
[facility]  ROUNDING NOTE   Subjective:   CRRT overnight. UF 920m.   Dopamine and norepinephrine gtt  Objective:  Vital signs in last 24 hours:  Temp:  [93 F (33.9 C)-98.1 F (36.7 C)] 97.5 F (36.4 C) (01/25 0800) Pulse Rate:  [61-87] 79 (01/25 0800) Resp:  [12-21] 14 (01/25 0800) BP: (64-128)/(48-69) 112/62 (01/25 0800) SpO2:  [91 %-100 %] 97 % (01/25 0800) FiO2 (%):  [35 %-40 %] 35 % (01/25 0800) Weight:  [81.6 kg] 81.6 kg (01/25 0534)  Weight change: 6 kg Filed Weights   07/16/18 0800 07/17/18 0220 07/18/18 0534  Weight: 75.6 kg 79.4 kg 81.6 kg    Intake/Output: I/O last 3 completed shifts: In: 2926 [I.V.:1399.5; NG/GT:1025; IV Piggyback:501.5] Out: 903 [Other:903]   Intake/Output this shift:  Total I/O In: 80.1 [I.V.:45.1; NG/GT:35] Out: 0   Physical Exam: General: Critically ill  Head: ETT  Eyes:  PERRL  Neck:  trachea midline  Lungs:  PRVC FiO2 35%  Heart: Regular rate and rhythm  Abdomen:  Soft   Extremities:  no peripheral edema.  Neurologic: Intubated and sedated  Skin: No lesions  Access: Left AVF    Basic Metabolic Panel: Recent Labs  Lab 07/17/18 1453 07/17/18 1823 07/17/18 2323 07/18/18 0232 07/18/18 0600  NA 136 135 134* 135 134*  K 4.2 4.3 4.2 4.0 3.9  CL 102 101 101 100 100  CO2 27 27 27 28 28   GLUCOSE 124* 131* 130* 154* 159*  BUN 26* 24* 23 24* 25*  CREATININE 2.80* 2.63* 2.53* 2.35* 2.41*  CALCIUM 8.2* 8.4* 8.4* 8.3* 8.4*  MG 2.0 2.2 2.1 2.2 2.0  PHOS 4.7* 4.9* 4.6 4.4 4.2    Liver Function Tests: Recent Labs  Lab 07/16/2018 1933 07/16/18 0448  07/17/18 1453 07/17/18 1823 07/17/18 2323 07/18/18 0232 07/18/18 0600  AST 43* 76*  --   --   --   --   --   --   ALT 19 39  --   --   --   --   --   --   ALKPHOS 89 114  --   --   --   --   --   --   BILITOT 0.9 1.0  --   --   --   --   --   --   PROT 5.8* 6.3*  --   --   --   --   --   --   ALBUMIN 2.7* 2.5*   < > 2.5* 2.8* 2.5* 2.5* 2.5*   < > = values  in this interval not displayed.   No results for input(s): LIPASE, AMYLASE in the last 168 hours. No results for input(s): AMMONIA in the last 168 hours.  CBC: Recent Labs  Lab 07/14/18 1056 07/14/2018 1543 07/13/2018 1933 07/16/18 0536 07/17/18 0407 07/18/18 0600  WBC 5.4  --  5.8 6.8 9.2 12.9*  NEUTROABS  --   --  3.7  --   --   --   HGB 8.3* 10.2* 9.9* 9.5* 10.2* 10.2*  HCT 28.3* 30.0* 35.3* 31.9* 33.9* 34.5*  MCV 94.6  --  99.2 91.9 90.6 93.2  PLT 143*  --  161 167 179 257    Cardiac Enzymes: Recent Labs  Lab 07/17/2018 1933 07/16/18 0118 07/16/18 1217 07/17/18 0218  TROPONINI 0.03* 0.87* 1.48* 0.54*    BNP: Invalid input(s): POCBNP  CBG: Recent Labs  Lab 07/17/18 1142 07/17/18 1934 07/17/18 2325 07/18/18 0327  07/18/18 0743  GLUCAP 115* 116* 105* 146* 142*    Microbiology: Results for orders placed or performed during the hospital encounter of 07/01/2018  Aerobic/Anaerobic Culture (surgical/deep wound)     Status: None (Preliminary result)   Collection Time: 07/16/2018  6:20 PM  Result Value Ref Range Status   Specimen Description   Final    WOUND RIGHT KNEE Performed at [facility], [place] [address], [place], [place] [address]    Special Requests   Final    NONE Performed at [facility], [address] [place], [place] [address]    Gram Stain   Final    FEW WBC PRESENT, PREDOMINANTLY PMN NO ORGANISMS SEEN Performed at [facility], [place] [address] [place], [place] [address]    Culture RARE PSEUDOMONAS AERUGINOSA  Final   Report Status PENDING  Incomplete  CULTURE, BLOOD (ROUTINE X 2) w Reflex to ID Panel     Status: None (Preliminary result)   Collection Time: 06/28/2018 10:03 PM  Result Value Ref Range Status   Specimen Description BLOOD RIGHT CENTRAL LINE   Final   Special Requests   Final    BOTTLES DRAWN AEROBIC AND ANAEROBIC Blood Culture results may not be optimal due to an excessive volume of blood received in culture  bottles   Culture   Final    NO GROWTH 3 DAYS Performed at [facility], [address], [place], [place] [address]    Report Status PENDING  Incomplete  Culture, Urine     Status: Abnormal   Collection Time: 06/29/2018 10:03 PM  Result Value Ref Range Status   Specimen Description URINE, RANDOM  Final   Special Requests   Final    Normal Performed at [facility], [address] [place], [place] [address]    Culture 10,000 COLONIES/mL ENTEROCOCCUS FAECALIS (A)  Final   Report Status 07/18/2018 FINAL  Final   Organism ID, Bacteria ENTEROCOCCUS FAECALIS (A)  Final      Susceptibility   Enterococcus faecalis - MIC*    AMPICILLIN <=2 SENSITIVE Sensitive     LEVOFLOXACIN 1 SENSITIVE Sensitive     NITROFURANTOIN <=16 SENSITIVE Sensitive     VANCOMYCIN 1 SENSITIVE Sensitive     * 10,000 COLONIES/mL ENTEROCOCCUS FAECALIS  CULTURE, BLOOD (ROUTINE X 2) w Reflex to ID Panel     Status: None (Preliminary result)   Collection Time: 07/10/2018 10:32 PM  Result Value Ref Range Status   Specimen Description BLOOD BLOOD RIGHT HAND  Final   Special Requests   Final    BOTTLES DRAWN AEROBIC AND ANAEROBIC Blood Culture adequate volume   Culture   Final    NO GROWTH 3 DAYS Performed at [facility], [address] [place], [place] [address]    Report Status PENDING  Incomplete    Coagulation Studies: No results for input(s): LABPROT, INR in the last 72 hours.  Urinalysis: Recent Labs    07/13/2018 2203  COLORURINE YELLOW*  LABSPEC 1.010  PHURINE 9.0*  GLUCOSEU 50*  HGBUR LARGE*  BILIRUBINUR NEGATIVE  KETONESUR NEGATIVE  PROTEINUR 100*  NITRITE NEGATIVE  LEUKOCYTESUR NEGATIVE      Imaging: Dg Chest Port 1 View  Result Date: 07/18/2018 CLINICAL DATA:  Respiratory failure EXAM: PORTABLE CHEST 1 VIEW COMPARISON:  Yesterday FINDINGS: Endotracheal tube tip just below the clavicular heads. The orogastric tube reaches the stomach at least. Cardiomegaly. CABG  and transcatheter aortic valve replacement. Continued retrocardiac opacity with  pleural effusion that is small to moderate. Lung volumes are low. Fullness of the right hilum that is vascular based on priors. IMPRESSION: Stable hardware positioning and opacified left base with pleural effusion. Electronically Signed   By: Monte Fantasia M.D.   On: 07/18/2018 06:58   Dg Chest Port 1 View  Result Date: 07/17/2018 CLINICAL DATA:  Respiratory failure EXAM: PORTABLE CHEST 1 VIEW COMPARISON:  Yesterday FINDINGS: Endotracheal tube tip just below the clavicular heads. The orogastric tube at least reaches the diaphragm. Artifact from EKG leads. Left pleural effusion and obscured left lower lobe. There is cardiomegaly. Prior median sternotomy for CABG. Transcatheter aortic valve replacement. Prominent right hilum that is vascular based on 2019 chest CT. IMPRESSION: Stable hardware positioning and low lung volumes with left pleural effusion. Electronically Signed   By: Monte Fantasia M.D.   On: 07/17/2018 06:18     Medications:   . ceFEPime (MAXIPIME) IV Stopped (07/18/18 6979)  . DOPamine 6 mcg/kg/min (07/18/18 0800)  . feeding supplement (NEPRO CARB STEADY) 35 mL/hr at 07/18/18 0800  . fentaNYL infusion INTRAVENOUS 100 mcg/hr (07/18/18 0800)  . norepinephrine (LEVOPHED) Adult infusion 5 mcg/min (07/18/18 0840)  . propofol (DIPRIVAN) infusion 35 mcg/kg/min (07/18/18 0740)  . pureflow 3 each (07/18/18 0300)   . B-complex with vitamin C  1 tablet Per Tube Daily  . chlorhexidine gluconate (MEDLINE KIT)  15 mL Mouth Rinse BID  . famotidine  20 mg Per Tube Daily  . feeding supplement (PRO-STAT SUGAR FREE 64)  30 mL Per Tube TID  . free water  30 mL Per Tube Q4H  . heparin injection (subcutaneous)  5,000 Units Subcutaneous Q8H  . hydrocortisone sod succinate (SOLU-CORTEF) inj  50 mg Intravenous Q6H  . insulin aspart  0-15 Units Subcutaneous Q4H  . mouth rinse  15 mL Mouth Rinse 10 times per day  .  vitamin C  250 mg Per Tube BID   fentaNYL, heparin, ipratropium-albuterol  Assessment/ Plan:  Jimmy Carson is a 78 y.o. white male with ESRD, hypothyroidism, hypertension, BPH, CAD status post CABG, hyperlipidemia, right total knee revision arthroplasty May 22, 2018 Dr. Marry Guan. Wound dehiscence on 1/22. Cardiac arrest post procedure.   [address]/[facility]/MWF  1. End-stage renal disease: with hyperkalemia hemodynamically unstable for intermittent hemodialysis.  - CVVHD. Increase ultrafiltration to 21m/hr  2. Cardiogenic shock with acute on chronic systolic congestive heart failure Respiratory failure requiring mechanical ventilation.  - Requiring vasopressors. Goal HR >60, goal MAP >65 - stress dose steroids  3. Lactic acidosis - empiric vanco and cefepime.   4. Anemia of chronic kidney disease: hemoglobin 10.2 - No indication for ESA    LOS: 3 Katiana Ruland 1/25/20208:46 AM

## 2018-07-18 NOTE — Progress Notes (Signed)
   Subjective: 3 Days Post-Op Procedure(s) (LRB): IRRIGATION AND DEBRIDEMENT AT CLOSURE OF RIGHT KNEE WOUND (Right) Patient remains intubated and unable to communicate Patient is well, and has had no acute complaints or problems  Currently being followed by medicine as well as cardiology No real change in status Plan is to go Rehab after hospital stay. no nausea and no vomiting Patient denies any chest pains or shortness of breath. Objective: Vital signs in last 24 hours: Temp:  [93 F (33.9 C)-98.1 F (36.7 C)] 97.5 F (36.4 C) (01/25 0700) Pulse Rate:  [61-87] 78 (01/25 0700) Resp:  [12-21] 15 (01/25 0700) BP: (64-128)/(48-69) 111/66 (01/25 0700) SpO2:  [91 %-100 %] 98 % (01/25 0718) FiO2 (%):  [35 %-40 %] 35 % (01/25 0718) Weight:  [81.6 kg] 81.6 kg (01/25 0534) well approximated incision Heels are non tender and elevated off the bed using rolled towels Intake/Output from previous day: 01/24 0701 - 01/25 0700 In: 2359.5 [I.V.:1033; NG/GT:1025; IV Piggyback:301.5] Out: 903  Intake/Output this shift: No intake/output data recorded.  Recent Labs    07/03/2018 1543 07/07/2018 1933 07/16/18 0536 07/17/18 0407 07/18/18 0600  HGB 10.2* 9.9* 9.5* 10.2* 10.2*   Recent Labs    07/17/18 0407 07/18/18 0600  WBC 9.2 12.9*  RBC 3.74* 3.70*  HCT 33.9* 34.5*  PLT 179 257   Recent Labs    07/18/18 0232 07/18/18 0600  NA 135 134*  K 4.0 3.9  CL 100 100  CO2 28 28  BUN 24* 25*  CREATININE 2.35* 2.41*  GLUCOSE 154* 159*  CALCIUM 8.3* 8.4*   No results for input(s): LABPT, INR in the last 72 hours.  EXAM General - Patient is Intubated and unable to communicate Extremity - Intact pulses distally No cellulitis present Compartment soft Patient is noted to have significant edema to both upper extremities as well as lower extremities. Dressing - scant drainage Motor Function -nonresponsive to lower extremity  Past Medical History:  Diagnosis Date  . Anemia   . CAD  (coronary artery disease)    s/p percutaneous coronary angioplasty, s/p CABG x 6   . ESRD (end stage renal disease) ([place])   . Heart attack ([place]) 2012  . History of radiation therapy    for prostate CA  . Hyperlipidemia   . Hyperparathyroidism, secondary renal ([place])   . Hypertension   . Osteoarthritis   . Polycystic kidney disease   . Prostate CA [facility]) 2012   Brachytherapy  . Renal insufficiency    currently on dialysis  . Shingles     Assessment/Plan: 3 Days Post-Op Procedure(s) (LRB): IRRIGATION AND DEBRIDEMENT AT CLOSURE OF RIGHT KNEE WOUND (Right) Active Problems:   Respiratory failure (HCC)   Malnutrition of moderate degree  Estimated body mass index is 25.81 kg/m as calculated from the following:   Height as of this encounter: 5\' 10"  (1.778 m).   Weight as of this encounter: 81.6 kg. Discharge to SNF once patient is medically cleared  Labs: Labs from most part unchanged in regards to sodium potassium and hemoglobin DVT Prophylaxis - TED hose and Heparin as well as SCD pumps Appreciate medicine and cardiology's assistance with this patient  Jillyn Ledger. [place] 07/18/2018, 8:21 AM

## 2018-07-19 ENCOUNTER — Inpatient Hospital Stay: Payer: Medicare Other

## 2018-07-19 DIAGNOSIS — I255 Ischemic cardiomyopathy: Secondary | ICD-10-CM

## 2018-07-19 LAB — RENAL FUNCTION PANEL
Albumin: 2.4 g/dL — ABNORMAL LOW (ref 3.5–5.0)
Albumin: 2.4 g/dL — ABNORMAL LOW (ref 3.5–5.0)
Albumin: 2.2 g/dL — ABNORMAL LOW (ref 3.5–5.0)
Albumin: 2.3 g/dL — ABNORMAL LOW (ref 3.5–5.0)
Albumin: 2.4 g/dL — ABNORMAL LOW (ref 3.5–5.0)
Albumin: 2.4 g/dL — ABNORMAL LOW (ref 3.5–5.0)
Anion gap: 8 (ref 5–15)
Anion gap: 7 (ref 5–15)
Anion gap: 5 (ref 5–15)
Anion gap: 6 (ref 5–15)
Anion gap: 4 — ABNORMAL LOW (ref 5–15)
Anion gap: 6 (ref 5–15)
BUN: 23 mg/dL (ref 8–23)
BUN: 24 mg/dL — ABNORMAL HIGH (ref 8–23)
BUN: 24 mg/dL — ABNORMAL HIGH (ref 8–23)
BUN: 25 mg/dL — ABNORMAL HIGH (ref 8–23)
BUN: 25 mg/dL — ABNORMAL HIGH (ref 8–23)
BUN: 25 mg/dL — ABNORMAL HIGH (ref 8–23)
CALCIUM: 8.7 mg/dL — AB (ref 8.9–10.3)
CALCIUM: 8.8 mg/dL — AB (ref 8.9–10.3)
CALCIUM: 9 mg/dL (ref 8.9–10.3)
CHLORIDE: 104 mmol/L (ref 98–111)
CO2: 27 mmol/L (ref 22–32)
CO2: 26 mmol/L (ref 22–32)
CO2: 29 mmol/L (ref 22–32)
CO2: 27 mmol/L (ref 22–32)
CO2: 28 mmol/L (ref 22–32)
CO2: 27 mmol/L (ref 22–32)
Calcium: 8.8 mg/dL — ABNORMAL LOW (ref 8.9–10.3)
Calcium: 8.7 mg/dL — ABNORMAL LOW (ref 8.9–10.3)
Calcium: 9 mg/dL (ref 8.9–10.3)
Chloride: 100 mmol/L (ref 98–111)
Chloride: 101 mmol/L (ref 98–111)
Chloride: 101 mmol/L (ref 98–111)
Chloride: 104 mmol/L (ref 98–111)
Chloride: 104 mmol/L (ref 98–111)
Creatinine, Ser: 1.72 mg/dL — ABNORMAL HIGH (ref 0.61–1.24)
Creatinine, Ser: 1.82 mg/dL — ABNORMAL HIGH (ref 0.61–1.24)
Creatinine, Ser: 1.99 mg/dL — ABNORMAL HIGH (ref 0.61–1.24)
Creatinine, Ser: 1.86 mg/dL — ABNORMAL HIGH (ref 0.61–1.24)
Creatinine, Ser: 1.75 mg/dL — ABNORMAL HIGH (ref 0.61–1.24)
Creatinine, Ser: 1.77 mg/dL — ABNORMAL HIGH (ref 0.61–1.24)
GFR calc Af Amer: 44 mL/min — ABNORMAL LOW (ref >60)
GFR calc Af Amer: 41 mL/min — ABNORMAL LOW (ref >60)
GFR calc Af Amer: 37 mL/min — ABNORMAL LOW (ref >60)
GFR calc Af Amer: 40 mL/min — ABNORMAL LOW (ref >60)
GFR calc Af Amer: 43 mL/min — ABNORMAL LOW (ref >60)
GFR calc Af Amer: 42 mL/min — ABNORMAL LOW (ref >60)
GFR calc non Af Amer: 38 mL/min — ABNORMAL LOW (ref >60)
GFR calc non Af Amer: 35 mL/min — ABNORMAL LOW (ref >60)
GFR calc non Af Amer: 37 mL/min — ABNORMAL LOW (ref >60)
GFR calc non Af Amer: 36 mL/min — ABNORMAL LOW (ref >60)
GFR, EST NON AFRICAN AMERICAN: 32 mL/min — AB (ref >60)
GFR, EST NON AFRICAN AMERICAN: 34 mL/min — AB (ref >60)
Glucose, Bld: 137 mg/dL — ABNORMAL HIGH (ref 70–99)
Glucose, Bld: 135 mg/dL — ABNORMAL HIGH (ref 70–99)
Glucose, Bld: 127 mg/dL — ABNORMAL HIGH (ref 70–99)
Glucose, Bld: 124 mg/dL — ABNORMAL HIGH (ref 70–99)
Glucose, Bld: 130 mg/dL — ABNORMAL HIGH (ref 70–99)
Glucose, Bld: 130 mg/dL — ABNORMAL HIGH (ref 70–99)
PHOSPHORUS: 2.5 mg/dL (ref 2.5–4.6)
POTASSIUM: 3.4 mmol/L — AB (ref 3.5–5.1)
Phosphorus: 2.6 mg/dL (ref 2.5–4.6)
Phosphorus: 2.9 mg/dL (ref 2.5–4.6)
Phosphorus: 2.5 mg/dL (ref 2.5–4.6)
Phosphorus: 2.4 mg/dL — ABNORMAL LOW (ref 2.5–4.6)
Phosphorus: 2 mg/dL — ABNORMAL LOW (ref 2.5–4.6)
Potassium: 3.4 mmol/L — ABNORMAL LOW (ref 3.5–5.1)
Potassium: 3.7 mmol/L (ref 3.5–5.1)
Potassium: 3.8 mmol/L (ref 3.5–5.1)
Potassium: 3.7 mmol/L (ref 3.5–5.1)
Potassium: 3.5 mmol/L (ref 3.5–5.1)
SODIUM: 136 mmol/L (ref 135–145)
Sodium: 135 mmol/L (ref 135–145)
Sodium: 134 mmol/L — ABNORMAL LOW (ref 135–145)
Sodium: 135 mmol/L (ref 135–145)
Sodium: 137 mmol/L (ref 135–145)
Sodium: 137 mmol/L (ref 135–145)

## 2018-07-19 LAB — GLUCOSE, CAPILLARY
GLUCOSE-CAPILLARY: 128 mg/dL — AB (ref 70–99)
Glucose-Capillary: 115 mg/dL — ABNORMAL HIGH (ref 70–99)
Glucose-Capillary: 121 mg/dL — ABNORMAL HIGH (ref 70–99)
Glucose-Capillary: 132 mg/dL — ABNORMAL HIGH (ref 70–99)
Glucose-Capillary: 134 mg/dL — ABNORMAL HIGH (ref 70–99)
Glucose-Capillary: 139 mg/dL — ABNORMAL HIGH (ref 70–99)

## 2018-07-19 LAB — CBC
HCT: 30.8 % — ABNORMAL LOW (ref 39.0–52.0)
Hemoglobin: 9 g/dL — ABNORMAL LOW (ref 13.0–17.0)
MCH: 27.4 pg (ref 26.0–34.0)
MCHC: 29.2 g/dL — ABNORMAL LOW (ref 30.0–36.0)
MCV: 93.9 fL (ref 80.0–100.0)
Platelets: 190 10*3/uL (ref 150–400)
RBC: 3.28 MIL/uL — ABNORMAL LOW (ref 4.22–5.81)
RDW: 18.5 % — ABNORMAL HIGH (ref 11.5–15.5)
WBC: 12.1 10*3/uL — ABNORMAL HIGH (ref 4.0–10.5)
nRBC: 0 % (ref 0.0–0.2)

## 2018-07-19 LAB — HEPATIC FUNCTION PANEL
ALT: 17 U/L (ref 0–44)
AST: 20 U/L (ref 15–41)
Albumin: 2.4 g/dL — ABNORMAL LOW (ref 3.5–5.0)
Alkaline Phosphatase: 95 U/L (ref 38–126)
Bilirubin, Direct: 0.2 mg/dL (ref 0.0–0.2)
Indirect Bilirubin: 0.4 mg/dL (ref 0.3–0.9)
Total Bilirubin: 0.6 mg/dL (ref 0.3–1.2)
Total Protein: 5 g/dL — ABNORMAL LOW (ref 6.5–8.1)

## 2018-07-19 LAB — MAGNESIUM
MAGNESIUM: 2 mg/dL (ref 1.7–2.4)
Magnesium: 2 mg/dL (ref 1.7–2.4)
Magnesium: 2 mg/dL (ref 1.7–2.4)
Magnesium: 2 mg/dL (ref 1.7–2.4)
Magnesium: 2 mg/dL (ref 1.7–2.4)
Magnesium: 2.1 mg/dL (ref 1.7–2.4)

## 2018-07-19 MED ORDER — PUREFLOW DIALYSIS SOLUTION
INTRAVENOUS | Status: DC
  Administered 2018-07-19 (×2): 3 via INTRAVENOUS_CENTRAL
  Administered 2018-07-19 – 2018-07-21 (×3): via INTRAVENOUS_CENTRAL

## 2018-07-19 MED ORDER — HEPARIN SODIUM (PORCINE) 5000 UNIT/ML IJ SOLN
5000.0000 [IU] | Freq: Two times a day (BID) | INTRAMUSCULAR | Status: DC
  Administered 2018-07-19 – 2018-07-21 (×4): 5000 [IU] via SUBCUTANEOUS
  Filled 2018-07-19 (×4): qty 1

## 2018-07-19 MED ORDER — EPOETIN ALFA 10000 UNIT/ML IJ SOLN: 10000.0000 [IU] | INTRAMUSCULAR | Status: DC

## 2018-07-19 NOTE — Progress Notes (Signed)
PULMONARY / CRITICAL CARE MEDICINE   Name: Jimmy Carson MRN: [id number] DOB: 1941-03-11    ADMISSION DATE:  07/04/2018 CONSULTATION DATE:  06/26/2018  REFERRING MD:  Dr. Marry Guan  CHIEF COMPLAINT: Brief cardiac arrest in OR  PT PROFILE: 45 M was undergoing elective I&D of Right knee wound.  At end of case, suffered bradycardia requiring brief CPR with persistent hypotension after resuscitation.  Remained intubated postprocedure and transferred to ICU  SUBJECTIVE:  Little change overall.  Agitated with PSVT on WUA.  Synchronous with vent.  Sedated on propofol.  VITAL SIGNS: BP (!) 99/58 (BP Location: Right Arm)   Pulse 81   Temp (!) 96.8 F (36 C) (Esophageal)   Resp 20   Ht 5\' 10"  (1.778 m)   Wt 82 kg   SpO2 95% Comment: Simultaneous filing. User may not have seen previous data.  BMI 25.94 kg/m   HEMODYNAMICS:    VENTILATOR SETTINGS: Vent Mode: PRVC FiO2 (%):  [35 %-40 %] 40 % Set Rate:  [14 bmp] 14 bmp Vt Set:  [500 mL] 500 mL PEEP:  [5 cmH20] 5 cmH20 Plateau Pressure:  [17 cmH20] 17 cmH20  INTAKE / OUTPUT: I/O last 3 completed shifts: In: 2322.7 [I.V.:885.6; NG/GT:1137.1; IV Piggyback:300.1] Out: 2231 [Other:2231]  PHYSICAL EXAMINATION: General: RASS -4, intubated, sedated, not F/C Neuro: CNs intact, MAEs HEENT: NCAT, sclerae white NECK: JVP not visualized Cardiovascular: Regular, soft systolic murmur Lungs: Clear anteriorly Abdomen: Soft, + BS, tolerating TF's Ext: Symmetric bilateral pretibial, ankle, pedal edema Skin: no lesions noted  LABS:  BMET Recent Labs  Lab 07/19/18 0217 07/19/18 0517 07/19/18 1012  NA 135 134* 135  K 3.4* 3.4* 3.7  CL 100 101 101  CO2 27 26 29   BUN 23 24* 24*  CREATININE 1.72* 1.82* 1.99*  GLUCOSE 137* 135* 127*    Electrolytes Recent Labs  Lab 07/19/18 0217 07/19/18 0517 07/19/18 1012  CALCIUM 8.8* 8.7* 8.7*  MG 2.0 2.0 2.0  PHOS 2.6 2.9 2.5    CBC Recent Labs  Lab 07/17/18 0407 07/18/18 0600  07/19/18 0517  WBC 9.2 12.9* 12.1*  HGB 10.2* 10.2* 9.0*  HCT 33.9* 34.5* 30.8*  PLT 179 257 190    Coag's Recent Labs  Lab 07/14/18 1056 07/17/18 0407  APTT  --  42*  INR 1.21  --     Sepsis Markers Recent Labs  Lab 07/14/2018 1933 07/08/2018 1936 07/13/2018 2203 07/16/18 0448 07/17/18 0407  LATICACIDVEN  --  5.5* 3.1*  --   --   PROCALCITON 0.46  --   --  12.68 15.41    ABG Recent Labs  Lab 07/01/2018 1930 07/16/18 0500  PHART 7.29* 7.48*  PCO2ART 44 37  PO2ART 302* 177*    Liver Enzymes Recent Labs  Lab 07/20/2018 1933 07/16/18 0448  07/19/18 0217 07/19/18 0517 07/19/18 1012  AST 43* 76*  --   --  20  --   ALT 19 39  --   --  17  --   ALKPHOS 89 114  --   --  95  --   BILITOT 0.9 1.0  --   --  0.6  --   ALBUMIN 2.7* 2.5*   < > 2.4* 2.4*  2.4* 2.2*   < > = values in this interval not displayed.    Cardiac Enzymes Recent Labs  Lab 07/16/18 0118 07/16/18 1217 07/17/18 0218  TROPONINI 0.87* 1.48* 0.54*    Glucose Recent Labs  Lab 07/18/18 1556 07/18/18 1922 07/18/18  2351 07/19/18 0327 07/19/18 0734 07/19/18 1126  GLUCAP 120* 140* 128* 134* 132* 128*    CXR: No significant change    STUDIES:  Echocardiogram 1/23: LVEF 40-45%.  Mild MR.  Status post AoVR Venous Ultrasound bilateral LE 1/23>> No evidence of deep venous thrombosis. Limited evaluation of left saphenofemoral junction due to groin bandage   CULTURES: Wound R Knee 1/22 >> rare Pseudomonas, pansensitive Blood 1/22 >> NEG Urine 1/22>> 10k enterococcus  ANTIBIOTICS: Clindamycin 1/22 >> 1/23 Vancomycin 1/22 >> 1/23 Cefepime 1/22 >>   SIGNIFICANT EVENTS: 1/22>> Elective I&D of Right knee 1/22>> Hypotension at end of procedure, required transfer to ICU  LINES/TUBES: ETT 1/22 >>  Right Femoral CVC 1/22 >>   L femoral HD cath 1/23 >>   ASSESSMENT / PLAN:  PULMONARY A: VDRF due to brief bradycardic arrest in OR during R knee surgery  Chronic L pleural disease - no  indication for thoracentesis P:   Cont vent support - settings reviewed and/or adjusted Wean in PSV mode as tolerated Cont vent bundle Daily SBT if/when meets criteria   CARDIOVASCULAR A:  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 New LBBB pattern on EKG S/P bradycardic arrest in OR 1/22 Shock, cardiogenic + septic  Mildly elevated Trop I, likely demand ischemia P:  Cardiology following Cont NE to maintain MEP >60 mmHg and HR >60/min Continue empiric stress dose steroids, dose reduced 1/26  RENAL A:   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 Hyperkalemia, resolved P:  Monitor BMET intermittently Monitor I/Os Correct electrolytes as indicated Continue CRRT per nephrology  GASTROINTESTINAL A:   No acute issues P:   SUP: Enteral famotidine Continue TF protocol initiated 1/24  HEMATOLOGIC A:   Anemia of chronic disease, no overt bleeding P:  DVT px: SQ heparin Monitor CBC intermittently Transfuse per usual guidelines  INFECTIOUS A:   Elevated procalcitonin Severe sepsis Septic right knee arthritis, Pseudomonas P:   Monitor temp, WBC count Micro and abx as above Recheck PCT in AM 01/27  ENDOCRINE A:   Steroid - induced hyperglycemia, controlled P:   Cont SSI protocol  NEUROLOGIC A:   Acute Metabolic Encephalopathy Vent/ICU/post op discomfort P:   RASS goal: -1, -2 Continue PAD protocol, propofol infusion + intermittent fentanyl Daily WUA   FAMILY  I spoke with patient's daughter and girlfriend (who share [facility] POA) and emphasized that we are making very little progress towards extubation and recovery from this critical illness.  They acknowledge his poor state of health prior to this illness.  We addressed CODE STATUS and they agree that he should be DNR in event of cardiac arrest.  We agreed that we should continue support for a limited time (no more than 2-3 days) and if still no improvement is evident, we should proceed with  terminal extubation and comfort measures.  CCM time: 40 mins The above time includes time spent in consultation with patient and/or family members and reviewing care plan on multidisciplinary rounds  Merton Border, MD PCCM service Mobile [phone] Pager [phone]  07/19/2018, 12:46 PM

## 2018-07-19 NOTE — Progress Notes (Signed)
Pt went into SVT in the 160's during his WUA. Sedation resumed and Dr. Alva Garnet notified.

## 2018-07-19 NOTE — Progress Notes (Signed)
Pharmacy Antibiotic Note  Jimmy Carson is a 78 y.o. male admitted on 07/14/2018 with sepsis. Pt was undergoing I&D procedure where sustained hypotension and CPR was performed. PMH includes ESRD, polycystic kidney disease, prostate cancer, and anemia.  Pharmacy has been consulted for cefepime dosing.  Vancomycin DCd 1/24.  CRRT started 1/23   Plan: Continue cefepime 2g IV q8h (CRRT dosing)   Height: 5\' 10"  (177.8 cm) Weight: 180 lb 12.4 oz (82 kg) IBW/kg (Calculated) : 73  Temp (24hrs), Avg:96.9 F (36.1 C), Min:95.7 F (35.4 C), Max:97.7 F (36.5 C)  Recent Labs  Lab 07/01/2018 1933 07/20/2018 1936 07/09/2018 2203  07/16/18 0536  07/17/18 0407  07/18/18 0600  07/18/18 1452 07/18/18 1800 07/18/18 2158 07/19/18 0217 07/19/18 0517  WBC 5.8  --   --   --  6.8  --  9.2  --  12.9*  --   --   --   --   --  12.1*  CREATININE 5.94*  --   --    < >  --    < >  --    < > 2.41*   < > 1.99* 1.85* 1.84* 1.72* 1.82*  LATICACIDVEN  --  5.5* 3.1*  --   --   --   --   --   --   --   --   --   --   --   --    < > = values in this interval not displayed.    Estimated Creatinine Clearance: 35.7 mL/min (A) (by C-G formula based on SCr of 1.82 mg/dL (H)).    Allergies  Allergen Reactions  . Orange Fruit [Citrus] Hives  . Penicillins Hives and Other (See Comments)    Has patient had a PCN reaction causing immediate rash, facial/tongue/throat swelling, SOB or lightheadedness with hypotension: Yes Has patient had a PCN reaction causing severe rash involving mucus membranes or skin necrosis: No Has patient had a PCN reaction that required hospitalization No Has patient had a PCN reaction occurring within the last 10 years: No If all of the above answers are "NO", then may proceed with Cephalosporin use.     Antimicrobials this admission: Gentamicin 1/22 x 1 Clindamycin 1/22  >> 1/23 Vancomycin 1/22 >> 1/23 Cefepime 1/22 >>   Dose adjustments this admission: 1/23 cefepime adjusted to  2g q8h for CRRT 1/23 vancomycin adjusted to 1g q24h for CRRT  Microbiology results: 1/22 BCx: no growth x 2 1/22 UCx: 10K Enterococcus Faecalis  Knee Wound Cx: Pseudomonas   Thank you for allowing pharmacy to be a part of this patient's care.  Laural Benes, PharmD, BCPS Clinical Pharmacist 07/19/2018 10:00 AM

## 2018-07-19 NOTE — Progress Notes (Signed)
[facility]  ROUNDING NOTE   Subjective:   CRRT. BFR down to 280 UF 1635  Family at bedside  Weaned off dopamine.   Continues norepinephrine gtt  Objective:  Vital signs in last 24 hours:  Temp:  [95.7 F (35.4 C)-97.7 F (36.5 C)] 97 F (36.1 C) (01/26 0800) Pulse Rate:  [48-100] 71 (01/26 0600) Resp:  [12-23] 14 (01/26 0800) BP: (89-129)/(48-67) 119/65 (01/26 0800) SpO2:  [92 %-100 %] 99 % (01/26 0800) FiO2 (%):  [35 %-40 %] 40 % (01/26 0748) Weight:  [82 kg] 82 kg (01/26 0500)  Weight change: 0.4 kg Filed Weights   07/17/18 0220 07/18/18 0534 07/19/18 0500  Weight: 79.4 kg 81.6 kg 82 kg    Intake/Output: I/O last 3 completed shifts: In: 2322.7 [I.V.:885.6; NG/GT:1137.1; IV Piggyback:300.1] Out: 2231 [Other:2231]   Intake/Output this shift:  Total I/O In: 490.9 [I.V.:290.9; IV Piggyback:200] Out: 75 [Other:75]  Physical Exam: General: Critically ill  Head: ETT, OGT  Eyes:  PERRL  Neck:  trachea midline  Lungs:  PRVC FiO2 40%  Heart: tachycardia  Abdomen:  Soft   Extremities:  no peripheral edema.  Neurologic: Intubated and sedated, agitated off sedation  Skin: No lesions  Access: Left AVF    Basic Metabolic Panel: Recent Labs  Lab 07/18/18 1452 07/18/18 1800 07/18/18 1803 07/18/18 2158 07/19/18 0217 07/19/18 0517  NA 134* 135  --  135 135 134*  K 3.4* 3.4*  --  3.3* 3.4* 3.4*  CL 100 100  --  100 100 101  CO2 26 28  --  27 27 26   GLUCOSE 140* 140*  --  145* 137* 135*  BUN 24* 23  --  24* 23 24*  CREATININE 1.99* 1.85*  --  1.84* 1.72* 1.82*  CALCIUM 8.7* 8.8*  --  8.8* 8.8* 8.7*  MG 2.2  --  2.1 2.0 2.0 2.0  PHOS 3.5 3.2  --  2.9 2.6 2.9    Liver Function Tests: Recent Labs  Lab 07/05/2018 1933 07/16/18 0448  07/18/18 1452 07/18/18 1800 07/18/18 2158 07/19/18 0217 07/19/18 0517  AST 43* 76*  --   --   --   --   --  20  ALT 19 39  --   --   --   --   --  17  ALKPHOS 89 114  --   --   --   --   --  95  BILITOT 0.9  1.0  --   --   --   --   --  0.6  PROT 5.8* 6.3*  --   --   --   --   --  5.0*  ALBUMIN 2.7* 2.5*   < > 2.6* 2.3* 2.3* 2.4* 2.4*  2.4*   < > = values in this interval not displayed.   No results for input(s): LIPASE, AMYLASE in the last 168 hours. No results for input(s): AMMONIA in the last 168 hours.  CBC: Recent Labs  Lab 07/14/2018 1933 07/16/18 0536 07/17/18 0407 07/18/18 0600 07/19/18 0517  WBC 5.8 6.8 9.2 12.9* 12.1*  NEUTROABS 3.7  --   --   --   --   HGB 9.9* 9.5* 10.2* 10.2* 9.0*  HCT 35.3* 31.9* 33.9* 34.5* 30.8*  MCV 99.2 91.9 90.6 93.2 93.9  PLT 161 167 179 257 190    Cardiac Enzymes: Recent Labs  Lab 07/02/2018 1933 07/16/18 0118 07/16/18 1217 07/17/18 0218  TROPONINI 0.03* 0.87* 1.48* 0.54*  BNP: Invalid input(s): POCBNP  CBG: Recent Labs  Lab 07/18/18 1556 07/18/18 1922 07/18/18 2351 07/19/18 0327 07/19/18 0734  GLUCAP 120* 140* 128* 134* 132*    Microbiology: Results for orders placed or performed during the hospital encounter of 07/20/2018  Aerobic/Anaerobic Culture (surgical/deep wound)     Status: None (Preliminary result)   Collection Time: 07/11/2018  6:20 PM  Result Value Ref Range Status   Specimen Description   Final    WOUND RIGHT KNEE Performed at [facility], [place] [address] [place], [place] [address]    Special Requests   Final    NONE Performed at [facility], [address] [place], [place] [address]    Gram Stain   Final    FEW WBC PRESENT, PREDOMINANTLY PMN NO ORGANISMS SEEN Performed at [facility], [place] [address], [place], [place] [address]    Culture   Final    RARE PSEUDOMONAS AERUGINOSA NO ANAEROBES ISOLATED; CULTURE IN PROGRESS FOR 5 DAYS    Report Status PENDING  Incomplete   Organism ID, Bacteria PSEUDOMONAS AERUGINOSA  Final      Susceptibility   Pseudomonas aeruginosa - MIC*    CEFTAZIDIME 2 SENSITIVE Sensitive     CIPROFLOXACIN <=0.25 SENSITIVE Sensitive     GENTAMICIN <=1  SENSITIVE Sensitive     IMIPENEM 1 SENSITIVE Sensitive     PIP/TAZO <=4 SENSITIVE Sensitive     CEFEPIME <=1 SENSITIVE Sensitive     * RARE PSEUDOMONAS AERUGINOSA  CULTURE, BLOOD (ROUTINE X 2) w Reflex to ID Panel     Status: None (Preliminary result)   Collection Time: 07/14/2018 10:03 PM  Result Value Ref Range Status   Specimen Description BLOOD RIGHT CENTRAL LINE   Final   Special Requests   Final    BOTTLES DRAWN AEROBIC AND ANAEROBIC Blood Culture results may not be optimal due to an excessive volume of blood received in culture bottles   Culture   Final    NO GROWTH 4 DAYS Performed at [facility], [address], [place], [place] [address]    Report Status PENDING  Incomplete  Culture, Urine     Status: Abnormal   Collection Time: 07/17/2018 10:03 PM  Result Value Ref Range Status   Specimen Description URINE, RANDOM  Final   Special Requests   Final    Normal Performed at [facility], [address] [place], [place] [address]    Culture 10,000 COLONIES/mL ENTEROCOCCUS FAECALIS (A)  Final   Report Status 07/18/2018 FINAL  Final   Organism ID, Bacteria ENTEROCOCCUS FAECALIS (A)  Final      Susceptibility   Enterococcus faecalis - MIC*    AMPICILLIN <=2 SENSITIVE Sensitive     LEVOFLOXACIN 1 SENSITIVE Sensitive     NITROFURANTOIN <=16 SENSITIVE Sensitive     VANCOMYCIN 1 SENSITIVE Sensitive     * 10,000 COLONIES/mL ENTEROCOCCUS FAECALIS  CULTURE, BLOOD (ROUTINE X 2) w Reflex to ID Panel     Status: None (Preliminary result)   Collection Time: 07/01/2018 10:32 PM  Result Value Ref Range Status   Specimen Description BLOOD BLOOD RIGHT HAND  Final   Special Requests   Final    BOTTLES DRAWN AEROBIC AND ANAEROBIC Blood Culture adequate volume   Culture   Final    NO GROWTH 4 DAYS Performed at [facility], [address], [place], [place] [address]    Report Status PENDING  Incomplete    Coagulation  Studies: No results for input(s):  LABPROT, INR in the last 72 hours.  Urinalysis: No results for input(s): COLORURINE, LABSPEC, PHURINE, GLUCOSEU, HGBUR, BILIRUBINUR, KETONESUR, PROTEINUR, UROBILINOGEN, NITRITE, LEUKOCYTESUR in the last 72 hours.  Invalid input(s): APPERANCEUR    Imaging: Dg Chest Port 1 View  Result Date: 07/19/2018 CLINICAL DATA:  Respiratory failure EXAM: PORTABLE CHEST 1 VIEW COMPARISON:  Chest x-rays dated 07/18/2018 and 07/17/2018. FINDINGS: Endotracheal tube is stable in position with tip 3-4 cm above the carina. Enteric tube passes below the diaphragm. Heart size and mediastinal contours appear stable. Persistent opacity at the LEFT lung base, likely a combination of atelectasis and small pleural effusion. No pneumothorax. IMPRESSION: Stable chest. Electronically Signed   By: Franki Cabot M.D.   On: 07/19/2018 05:58   Dg Chest Port 1 View  Result Date: 07/18/2018 CLINICAL DATA:  Respiratory failure EXAM: PORTABLE CHEST 1 VIEW COMPARISON:  Yesterday FINDINGS: Endotracheal tube tip just below the clavicular heads. The orogastric tube reaches the stomach at least. Cardiomegaly. CABG and transcatheter aortic valve replacement. Continued retrocardiac opacity with pleural effusion that is small to moderate. Lung volumes are low. Fullness of the right hilum that is vascular based on priors. IMPRESSION: Stable hardware positioning and opacified left base with pleural effusion. Electronically Signed   By: Monte Fantasia M.D.   On: 07/18/2018 06:58     Medications:   . ceFEPime (MAXIPIME) IV Stopped (07/19/18 5638)  . DOPamine Stopped (07/18/18 0934)  . feeding supplement (NEPRO CARB STEADY) 1,000 mL (07/18/18 1035)  . norepinephrine (LEVOPHED) Adult infusion 9 mcg/min (07/19/18 0800)  . propofol (DIPRIVAN) infusion 15 mcg/kg/min (07/19/18 0801)  . pureflow 3 each (07/19/18 9373)   . B-complex with vitamin C  1 tablet Per Tube Daily  . chlorhexidine gluconate (MEDLINE KIT)  15 mL Mouth Rinse BID  .  famotidine  20 mg Per Tube Daily  . feeding supplement (PRO-STAT SUGAR FREE 64)  30 mL Per Tube TID  . free water  30 mL Per Tube Q4H  . heparin injection (subcutaneous)  5,000 Units Subcutaneous Q8H  . hydrocortisone sod succinate (SOLU-CORTEF) inj  50 mg Intravenous Q8H  . insulin aspart  0-15 Units Subcutaneous Q4H  . mouth rinse  15 mL Mouth Rinse 10 times per day  . vitamin C  250 mg Per Tube BID   fentaNYL (SUBLIMAZE) injection, fentaNYL (SUBLIMAZE) injection, heparin, ipratropium-albuterol  Assessment/ Plan:  [name] is a 78 y.o. white male with ESRD, hypothyroidism, hypertension, BPH, CAD status post CABG, hyperlipidemia, right total knee revision arthroplasty May 22, 2018 Dr. Marry Guan. Wound dehiscence on 1/22. Cardiac arrest post procedure on 07/16/2018.   [address]/MWF  1. End-stage renal disease: with hyperkalemia hemodynamically unstable for intermittent hemodialysis.  - CVVHD. Continue ultrafiltration  2. Cardiogenic shock with acute on chronic systolic congestive heart failure Respiratory failure requiring mechanical ventilation.  - Requiring vasopressors. Goal HR >60, goal MAP >65 - stress dose steroids  3. Sepsis with Lactic acidosis: wound culture with pseudomonas on 1/22 - cefepime.  - Warming blanket  4. Anemia of chronic kidney disease: hemoglobin 9 - Will start epo subcu on MWF schedule.    LOS: 4 Xenia Nile 1/26/20208:32 AM

## 2018-07-20 DIAGNOSIS — R600 Localized edema: Secondary | ICD-10-CM

## 2018-07-20 DIAGNOSIS — Z515 Encounter for palliative care: Secondary | ICD-10-CM

## 2018-07-20 DIAGNOSIS — Z66 Do not resuscitate: Secondary | ICD-10-CM

## 2018-07-20 LAB — GLUCOSE, CAPILLARY
Glucose-Capillary: 126 mg/dL — ABNORMAL HIGH (ref 70–99)
Glucose-Capillary: 114 mg/dL — ABNORMAL HIGH (ref 70–99)
Glucose-Capillary: 133 mg/dL — ABNORMAL HIGH (ref 70–99)
Glucose-Capillary: 149 mg/dL — ABNORMAL HIGH (ref 70–99)
Glucose-Capillary: 135 mg/dL — ABNORMAL HIGH (ref 70–99)
Glucose-Capillary: 126 mg/dL — ABNORMAL HIGH (ref 70–99)
Glucose-Capillary: 124 mg/dL — ABNORMAL HIGH (ref 70–99)

## 2018-07-20 LAB — RENAL FUNCTION PANEL
Albumin: 2.4 g/dL — ABNORMAL LOW (ref 3.5–5.0)
Albumin: 2.3 g/dL — ABNORMAL LOW (ref 3.5–5.0)
Albumin: 2.2 g/dL — ABNORMAL LOW (ref 3.5–5.0)
Albumin: 2.1 g/dL — ABNORMAL LOW (ref 3.5–5.0)
Anion gap: 4 — ABNORMAL LOW (ref 5–15)
Anion gap: 4 — ABNORMAL LOW (ref 5–15)
Anion gap: 7 (ref 5–15)
Anion gap: 8 (ref 5–15)
BUN: 24 mg/dL — ABNORMAL HIGH (ref 8–23)
BUN: 24 mg/dL — ABNORMAL HIGH (ref 8–23)
BUN: 24 mg/dL — AB (ref 8–23)
BUN: 27 mg/dL — ABNORMAL HIGH (ref 8–23)
CALCIUM: 8.9 mg/dL (ref 8.9–10.3)
CO2: 27 mmol/L (ref 22–32)
CO2: 29 mmol/L (ref 22–32)
CO2: 28 mmol/L (ref 22–32)
CO2: 26 mmol/L (ref 22–32)
CREATININE: 1.63 mg/dL — AB (ref 0.61–1.24)
Calcium: 8.6 mg/dL — ABNORMAL LOW (ref 8.9–10.3)
Calcium: 9 mg/dL (ref 8.9–10.3)
Calcium: 9.2 mg/dL (ref 8.9–10.3)
Chloride: 106 mmol/L (ref 98–111)
Chloride: 104 mmol/L (ref 98–111)
Chloride: 101 mmol/L (ref 98–111)
Chloride: 101 mmol/L (ref 98–111)
Creatinine, Ser: 1.64 mg/dL — ABNORMAL HIGH (ref 0.61–1.24)
Creatinine, Ser: 1.69 mg/dL — ABNORMAL HIGH (ref 0.61–1.24)
Creatinine, Ser: 1.59 mg/dL — ABNORMAL HIGH (ref 0.61–1.24)
GFR calc Af Amer: 46 mL/min — ABNORMAL LOW (ref >60)
GFR calc Af Amer: 47 mL/min — ABNORMAL LOW (ref >60)
GFR calc Af Amer: 45 mL/min — ABNORMAL LOW (ref >60)
GFR calc Af Amer: 48 mL/min — ABNORMAL LOW (ref >60)
GFR calc non Af Amer: 40 mL/min — ABNORMAL LOW (ref >60)
GFR calc non Af Amer: 40 mL/min — ABNORMAL LOW (ref >60)
GFR calc non Af Amer: 39 mL/min — ABNORMAL LOW (ref >60)
GFR, EST NON AFRICAN AMERICAN: 42 mL/min — AB (ref >60)
Glucose, Bld: 124 mg/dL — ABNORMAL HIGH (ref 70–99)
Glucose, Bld: 128 mg/dL — ABNORMAL HIGH (ref 70–99)
Glucose, Bld: 149 mg/dL — ABNORMAL HIGH (ref 70–99)
Glucose, Bld: 126 mg/dL — ABNORMAL HIGH (ref 70–99)
Phosphorus: 1.7 mg/dL — ABNORMAL LOW (ref 2.5–4.6)
Phosphorus: 1.8 mg/dL — ABNORMAL LOW (ref 2.5–4.6)
Phosphorus: 2.3 mg/dL — ABNORMAL LOW (ref 2.5–4.6)
Phosphorus: 2.7 mg/dL (ref 2.5–4.6)
Potassium: 3.4 mmol/L — ABNORMAL LOW (ref 3.5–5.1)
Potassium: 3.6 mmol/L (ref 3.5–5.1)
Potassium: 3.7 mmol/L (ref 3.5–5.1)
Potassium: 3.8 mmol/L (ref 3.5–5.1)
SODIUM: 137 mmol/L (ref 135–145)
Sodium: 137 mmol/L (ref 135–145)
Sodium: 136 mmol/L (ref 135–145)
Sodium: 135 mmol/L (ref 135–145)

## 2018-07-20 LAB — CBC
HCT: 30.2 % — ABNORMAL LOW (ref 39.0–52.0)
Hemoglobin: 8.9 g/dL — ABNORMAL LOW (ref 13.0–17.0)
MCH: 27.6 pg (ref 26.0–34.0)
MCHC: 29.5 g/dL — ABNORMAL LOW (ref 30.0–36.0)
MCV: 93.5 fL (ref 80.0–100.0)
Platelets: 132 10*3/uL — ABNORMAL LOW (ref 150–400)
RBC: 3.23 MIL/uL — ABNORMAL LOW (ref 4.22–5.81)
RDW: 18.8 % — ABNORMAL HIGH (ref 11.5–15.5)
WBC: 12.5 10*3/uL — ABNORMAL HIGH (ref 4.0–10.5)
nRBC: 0 % (ref 0.0–0.2)

## 2018-07-20 LAB — MAGNESIUM
MAGNESIUM: 2 mg/dL (ref 1.7–2.4)
MAGNESIUM: 2 mg/dL (ref 1.7–2.4)
Magnesium: 1.9 mg/dL (ref 1.7–2.4)
Magnesium: 2.1 mg/dL (ref 1.7–2.4)
Magnesium: 2 mg/dL (ref 1.7–2.4)
Magnesium: 2 mg/dL (ref 1.7–2.4)

## 2018-07-20 LAB — CULTURE, BLOOD (ROUTINE X 2)
Culture: NO GROWTH
Culture: NO GROWTH
Special Requests: ADEQUATE

## 2018-07-20 MED ORDER — SENNOSIDES 8.8 MG/5ML PO SYRP
5.0000 mL | ORAL_SOLUTION | Freq: Two times a day (BID) | ORAL | Status: DC | PRN
  Filled 2018-07-20: qty 5

## 2018-07-20 MED ORDER — POTASSIUM PHOSPHATES 15 MMOLE/5ML IV SOLN
20.0000 meq | Freq: Once | INTRAVENOUS | Status: AC
  Administered 2018-07-20: 20 meq via INTRAVENOUS
  Filled 2018-07-20: qty 4.55

## 2018-07-20 MED ORDER — SODIUM CHLORIDE 0.9 % IV SOLN
2.0000 g | Freq: Two times a day (BID) | INTRAVENOUS | Status: DC
  Administered 2018-07-20 – 2018-07-21 (×2): 2 g via INTRAVENOUS
  Filled 2018-07-20 (×3): qty 2

## 2018-07-20 MED ORDER — DOCUSATE SODIUM 50 MG/5ML PO LIQD
100.0000 mg | Freq: Two times a day (BID) | ORAL | Status: DC | PRN
  Administered 2018-07-20: 100 mg
  Filled 2018-07-20: qty 10

## 2018-07-20 MED ORDER — EPOETIN ALFA 40000 UNIT/ML IJ SOLN: 20000.0000 [IU] | INTRAMUSCULAR | Status: DC

## 2018-07-20 NOTE — Progress Notes (Signed)
Pharmacy Antibiotic Note  Jimmy Carson is a 78 y.o. male admitted on 07/21/2018 with sepsis. Pt was undergoing I&D procedure where sustained hypotension and CPR was performed. PMH includes ESRD, polycystic kidney disease, prostate cancer, and anemia.  Pharmacy has been consulted for cefepime dosing.  Vancomycin DCd 1/24.  CRRT started 1/23   Plan: Continue cefepime 2g IV q8h (CRRT dosing)   Height: 5\' 10"  (177.8 cm) Weight: 181 lb 3.5 oz (82.2 kg) IBW/kg (Calculated) : 73  Temp (24hrs), Avg:97.4 F (36.3 C), Min:95.9 F (35.5 C), Max:98.4 F (36.9 C)  Recent Labs  Lab 07/21/2018 1936 07/21/2018 2203  07/16/18 0536  07/17/18 0407  07/18/18 0600  07/19/18 0517  07/19/18 1819 07/19/18 2240 07/20/18 0206 07/20/18 0531 07/20/18 1035  WBC  --   --   --  6.8  --  9.2  --  12.9*  --  12.1*  --   --   --   --  12.5*  --   CREATININE  --   --    < >  --    < >  --    < > 2.41*   < > 1.82*   < > 1.75* 1.77* 1.64* 1.63* 1.69*  LATICACIDVEN 5.5* 3.1*  --   --   --   --   --   --   --   --   --   --   --   --   --   --    < > = values in this interval not displayed.    Estimated Creatinine Clearance: 38.4 mL/min (A) (by C-G formula based on SCr of 1.69 mg/dL (H)).    Allergies  Allergen Reactions  . Orange Fruit [Citrus] Hives  . Penicillins Hives and Other (See Comments)    Has patient had a PCN reaction causing immediate rash, facial/tongue/throat swelling, SOB or lightheadedness with hypotension: Yes Has patient had a PCN reaction causing severe rash involving mucus membranes or skin necrosis: No Has patient had a PCN reaction that required hospitalization No Has patient had a PCN reaction occurring within the last 10 years: No If all of the above answers are "NO", then may proceed with Cephalosporin use.     Antimicrobials this admission: Gentamicin 1/22 x 1 Clindamycin 1/22  >> 1/23 Vancomycin 1/22 >> 1/23 Cefepime 1/22 >>   Dose adjustments this admission: 1/23  cefepime adjusted to 2g q8h for CRRT 1/23 vancomycin adjusted to 1g q24h for CRRT  Microbiology results: 1/22 BCx: no growth x 2 1/22 UCx: 10K Enterococcus Faecalis  Knee Wound Cx: Pseudomonas   Thank you for allowing pharmacy to be a part of this patient's care.  Paulina Fusi, PharmD, BCPS 07/20/2018 1:05 PM

## 2018-07-20 NOTE — Addendum Note (Signed)
Addendum  created 07/20/18 1202 by Doreen Salvage, CRNA   Charge Capture section accepted

## 2018-07-20 NOTE — Progress Notes (Signed)
   Subjective: 5 Days Post-Op Procedure(s) (LRB): IRRIGATION AND DEBRIDEMENT AT CLOSURE OF RIGHT KNEE WOUND (Right)   Patient is well, and has had no acute complaints or problems We will start therapy today.  Range of motion right knee only.  Have discussed this with nursing. Plan is to go Rehab after hospital stay. no nausea and no vomiting Patient denies any chest pains or shortness of breath.  No real change in status. Objective: Vital signs in last 24 hours: Temp:  [96.3 F (35.7 C)-98.4 F (36.9 C)] 97.5 F (36.4 C) (01/27 0600) Pulse Rate:  [63-148] 72 (01/27 0600) Resp:  [13-32] 17 (01/27 0600) BP: (87-121)/(54-64) 88/58 (01/27 0600) SpO2:  [95 %-100 %] 100 % (01/27 0736) FiO2 (%):  [40 %] 40 % (01/27 0736) Weight:  [82.2 kg] 82.2 kg (01/27 0500) well approximated incision Heels are non tender and elevated off the bed using rolled towels Intake/Output from previous day: 01/26 0701 - 01/27 0700 In: 1925.9 [I.V.:685.7; NG/GT:840; IV Piggyback:400.2] Out: 3016  Intake/Output this shift: No intake/output data recorded.  Recent Labs    07/18/18 0600 07/19/18 0517 07/20/18 0531  HGB 10.2* 9.0* 8.9*   Recent Labs    07/19/18 0517 07/20/18 0531  WBC 12.1* 12.5*  RBC 3.28* 3.23*  HCT 30.8* 30.2*  PLT 190 132*   Recent Labs    07/20/18 0206 07/20/18 0531  NA 137 137  K 3.4* 3.6  CL 106 104  CO2 27 29  BUN 24* 24*  CREATININE 1.64* 1.63*  GLUCOSE 124* 128*  CALCIUM 8.6* 9.0   No results for input(s): LABPT, INR in the last 72 hours.  EXAM General - Patient is currently intubated on respirator Extremity - Intact pulses distally No cellulitis present Compartment soft Dressing - scant drainage.  No change from his exams Motor Function -nonresponsive at this time  Past Medical History:  Diagnosis Date  . Anemia   . CAD (coronary artery disease)    s/p percutaneous coronary angioplasty, s/p CABG x 6   . ESRD (end stage renal disease) ([place])   . Heart  attack ([place]) 2012  . History of radiation therapy    for prostate CA  . Hyperlipidemia   . Hyperparathyroidism, secondary renal ([place])   . Hypertension   . Osteoarthritis   . Polycystic kidney disease   . Prostate CA [facility]) 2012   Brachytherapy  . Renal insufficiency    currently on dialysis  . Shingles     Assessment/Plan: 5 Days Post-Op Procedure(s) (LRB): IRRIGATION AND DEBRIDEMENT AT CLOSURE OF RIGHT KNEE WOUND (Right) Active Problems:   Respiratory failure (HCC)   Malnutrition of moderate degree  Estimated body mass index is 26 kg/m as calculated from the following:   Height as of this encounter: 5\' 10"  (1.778 m).   Weight as of this encounter: 82.2 kg. We will begin some gentle range of motion of right knee. Plan to change dressing tomorrow Nursing states that they are going to try to extubate today possibly DVT Prophylaxis - TED hose and Heparin   Rella Egelston R. [place] 07/20/2018, 8:08 AM

## 2018-07-20 NOTE — Consult Note (Signed)
Consultation Note Date: 07/20/2018   Patient Name: Jimmy Carson  DOB: 04/10/1941  MRN: [id number]  Age / Sex: 78 y.o., male  PCP: Dion Body, MD Referring Physician: Dereck Leep, MD  Reason for Consultation: Establishing goals of care and Psychosocial/spiritual support  HPI/Patient Profile: 78 y.o. 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  Currently vent dependant and failing to thrive despite aggressive medical interventions.  Long term poor prognosis.  Family face treatment option decisions, advanced directive decisions and anticipatory care needs.   Clinical Assessment and Goals of Care:  HCPOA    SUMMARY OF RECOMMENDATIONS    Code Status/Advance Care Planning:  DNR- previously documented    Palliative Prophylaxis:   Aspiration, Delirium Protocol, Frequent Pain Assessment and Oral Care  Additional Recommendations (Limitations, Scope, Preferences):  Full Scope Treatment   Plan is for weaning and extubation in the morning.  Daughter and H POA/Jimmy Carson both agree that reintubation is not in this patient's best interest.  PMT will f/u for support  Dependant on outcomes/ post extubation, decisions related to continuation of dialysis and other life prolonging measures will need to be addressed   Psycho-social/Spiritual:   Desire for further Chaplaincy support:no  Additional Recommendations: Grief/Bereavement Support   Created space and opportunity for daughter and Jimmy Carson to share thoughts and feeling regarding current medical situation. 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  Prognosis:   Unable to determine- will depend on desire for life prolonging measures  Discharge Planning: To Be Determined      Primary Diagnoses: Present on Admission: . Respiratory failure ([place])   I have reviewed the medical record, interviewed the patient and family, and examined the patient. The following aspects are pertinent.  Past Medical History:  Diagnosis Date  . Anemia   . CAD (coronary artery disease)    s/p percutaneous coronary angioplasty, s/p CABG x 6   . ESRD (end stage renal disease) ([place])   . Heart attack ([place]) 2012  . History of radiation therapy    for prostate CA  . Hyperlipidemia   . Hyperparathyroidism, secondary renal ([place])   . Hypertension   . Osteoarthritis   . Polycystic kidney disease   . Prostate CA [facility]) 2012   Brachytherapy  . Renal insufficiency    currently on dialysis  . Shingles    Social History   Socioeconomic History  . Marital status: Widowed    Spouse name: Not on file  . Number of children: Not on file  . Years of education: Not on file  . Highest education level: Not on file  Occupational History  . Not on file  Social Needs  . Financial resource strain: Not on file  . Food insecurity:    Worry: Not on file  Inability: Not on file  . Transportation needs:    Medical: Not on file    Non-medical: Not on file  Tobacco Use  . Smoking status: Former Smoker    Last attempt to quit: 04/19/2013    Years since quitting: 5.2  . Smokeless tobacco: Never Used  Substance and Sexual Activity  . Alcohol use: No    Comment: occasional  . Drug use: No  . Sexual activity: Not on file  Lifestyle  . Physical activity:    Days per week: Not on file    Minutes per session: Not on file  . Stress: Not on file  Relationships  . Social connections:    Talks on phone: Not on file    Gets together: Not on file    Attends religious service: Not on file    Active member of club or organization: Not on file    Attends  meetings of clubs or organizations: Not on file    Relationship status: Not on file  Other Topics Concern  . Not on file  Social History Narrative  . Not on file   Family History  Problem Relation Age of Onset  . Polycystic kidney disease Father   . Aneurysm Father    Scheduled Meds: . B-complex with vitamin C  1 tablet Per Tube Daily  . chlorhexidine gluconate (MEDLINE KIT)  15 mL Mouth Rinse BID  . [START ON 07/27/2018] epoetin (EPOGEN/PROCRIT) injection  20,000 Units Subcutaneous Weekly  . famotidine  20 mg Per Tube Daily  . feeding supplement (PRO-STAT SUGAR FREE 64)  30 mL Per Tube TID  . free water  30 mL Per Tube Q4H  . heparin injection (subcutaneous)  5,000 Units Subcutaneous Q12H  . hydrocortisone sod succinate (SOLU-CORTEF) inj  50 mg Intravenous Q8H  . insulin aspart  0-15 Units Subcutaneous Q4H  . mouth rinse  15 mL Mouth Rinse 10 times per day  . vitamin C  250 mg Per Tube BID   Continuous Infusions: . ceFEPime (MAXIPIME) IV 2 g (07/20/18 1256)  . feeding supplement (NEPRO CARB STEADY) 1,000 mL (07/18/18 1035)  . norepinephrine (LEVOPHED) Adult infusion 8 mcg/min (07/20/18 1100)  . propofol (DIPRIVAN) infusion 50 mcg/kg/min (07/20/18 1136)  . pureflow 3 each (07/19/18 1351)   PRN Meds:.sennosides **AND** docusate, fentaNYL (SUBLIMAZE) injection, fentaNYL (SUBLIMAZE) injection, heparin, ipratropium-albuterol Medications Prior to Admission:  Prior to Admission medications   Medication Sig Start Date End Date Taking? Authorizing Provider  acetaminophen (TYLENOL) 500 MG tablet Take 1,000 mg by mouth every 6 (six) hours as needed for mild pain or headache.    Yes [provider]  cinacalcet (SENSIPAR) 60 MG tablet Take 60 mg by mouth every evening.    Yes [provider]  clopidogrel (PLAVIX) 75 MG tablet Take 75 mg by mouth daily.   Yes [provider]  isosorbide mononitrate (IMDUR) 60 MG 24 hr tablet Take 60 mg by mouth daily.   Yes  [provider]  midodrine (PROAMATINE) 10 MG tablet Take 1 tablet (10 mg total) by mouth 3 (three) times daily with meals. 06/27/18  Yes Wieting, Richard, MD  pyridOXINE (VITAMIN B-6) 100 MG tablet Take 100 mg by mouth daily.   Yes [provider]  rosuvastatin (CRESTOR) 10 MG tablet Take 10 mg by mouth daily.   Yes [provider]  sevelamer carbonate (RENVELA) 800 MG tablet Take 1,600-2,400 mg by mouth See admin instructions. Take 2400 mg by mouth 3 times daily with  meals and take 1600 mg by mouth with snacks   Yes [provider]  cephALEXin (KEFLEX) 500 MG capsule Take 500 mg by mouth 2 (two) times daily.    [provider]  nitroGLYCERIN (NITROSTAT) 0.4 MG SL tablet Place 1 tablet (0.4 mg total) under the tongue every 5 (five) minutes as needed for chest pain. Patient not taking: Reported on 07/07/2018 06/03/16   Max Sane, MD   Allergies  Allergen Reactions  . Orange Fruit [Citrus] Hives  . Penicillins Hives and Other (See Comments)    Has patient had a PCN reaction causing immediate rash, facial/tongue/throat swelling, SOB or lightheadedness with hypotension: Yes Has patient had a PCN reaction causing severe rash involving mucus membranes or skin necrosis: No Has patient had a PCN reaction that required hospitalization No Has patient had a PCN reaction occurring within the last 10 years: No If all of the above answers are "NO", then may proceed with Cephalosporin use.    Review of Systems  Unable to perform ROS: Acuity of condition    Physical Exam Constitutional:      Appearance: He is ill-appearing.     Interventions: He is intubated.  Cardiovascular:     Rate and Rhythm: Bradycardia present.  Pulmonary:     Effort: He is intubated.  Skin:    General: Skin is warm and dry.     Vital Signs: BP 118/67   Pulse (!) 31   Temp (!) 96.3 F (35.7 C)   Resp 15   Ht 5' 10"  (1.778 m)   Wt 82.2 kg   SpO2 100%   BMI 26.00 kg/m   Pain Scale: CPOT   Pain Score: 3    SpO2: SpO2: 100 % O2 Device:SpO2: 100 % O2 Flow Rate: .O2 Flow Rate (L/min): 40 L/min  IO: Intake/output summary:   Intake/Output Summary (Last 24 hours) at 07/20/2018 1305 Last data filed at 07/20/2018 1100 Gross per 24 hour  Intake 1676.46 ml  Output 1480 ml  Net 196.46 ml    LBM:   Baseline Weight: Weight: 75.6 kg Most recent weight: Weight: 82.2 kg     Palliative Assessment/Data:   Discussed with RN  Time In: 1220 Time Out: 1330 Time Total: 70 minutes Greater than 50%  of this time was spent counseling and coordinating care related to the above assessment and plan.  Signed by: Wadie Lessen, NP   Please contact Palliative Medicine Team phone at [phone] for questions and concerns.  For individual provider: See Shea Evans

## 2018-07-20 NOTE — Progress Notes (Signed)
11:20 sedation turned off for neuro assessment and vent wean.  11:35 patient awake and moving all extremities but not following any commands.  Patient placed in spontaneous mode 10/5.  Patient heart rate elevated from 60's to 110, sp02 decreased to 87-89%, RR elevated from 16 to 25.  Patient appeared uncomfortable.  Dr. Mortimer Fries at bedside.  Plan to sedate patient with propofol.

## 2018-07-20 NOTE — Progress Notes (Signed)
[facility]  ROUNDING NOTE   Subjective:   Patient remains critically ill, sedated and ventilator assisted.  FiO2 40%  CRRT Continued.  Ultrafiltration of 1500 cc last 24 hours Continued on low-dose Levophed Tube feeds at 35 cc/h  Objective:  Vital signs in last 24 hours:  Temp:  [96.3 F (35.7 C)-98.4 F (36.9 C)] 97.9 F (36.6 C) (01/27 0900) Pulse Rate:  [63-88] 66 (01/27 0900) Resp:  [14-32] 20 (01/27 0900) BP: (87-127)/(54-64) 127/60 (01/27 0900) SpO2:  [95 %-100 %] 98 % (01/27 0900) FiO2 (%):  [40 %] 40 % (01/27 0900) Weight:  [82.2 kg] 82.2 kg (01/27 0500)  Weight change: 0.2 kg Filed Weights   07/18/18 0534 07/19/18 0500 07/20/18 0500  Weight: 81.6 kg 82 kg 82.2 kg    Intake/Output: I/O last 3 completed shifts: In: 1925.9 [I.V.:685.7; NG/GT:840; IV Piggyback:400.2] Out: 2394 [Other:2394]   Intake/Output this shift:  Total I/O In: 697.1 [I.V.:41.6; NG/GT:382.9; IV Piggyback:272.6] Out: -   Physical Exam: General: Critically ill  Head: ETT, OGT  Lungs:  PRVC FiO2 40%  Heart: tachycardia  Abdomen:  Soft   Extremities: 2+ peripheral edema., rt knee surgical dressing in place  Neurologic: Intubated and sedated, agitated off sedation  Skin: No acute lesions  Access: Left AVF, good thrill, Left fem temp cath    Basic Metabolic Panel: Recent Labs  Lab 07/19/18 1357 07/19/18 1819 07/19/18 2240 07/20/18 0206 07/20/18 0531  NA 137 136 137 137 137  K 3.8 3.7 3.5 3.4* 3.6  CL 104 104 104 106 104  CO2 _0 GLUCOSE 124* 130* 130* 124* 128*  BUN 25* 25* 25* 24* 24*  CREATININE 1.86* 1.75* 1.77* 1.64* 1.63*  CALCIUM 8.8* 9.0 9.0 8.6* 9.0  MG 2.0 2.0 2.1 1.9 2.0  PHOS 2.5 2.4* 2.0* 1.7* 1.8*    Liver Function Tests: Recent Labs  Lab 07/07/2018 1933 07/16/18 0448  07/19/18 0517  07/19/18 1357 07/19/18 1819 07/19/18 2240 07/20/18 0206 07/20/18 0531  AST 43* 76*  --  20  --   --   --   --   --   --   ALT 19 39  --  17  --    --   --   --   --   --   ALKPHOS 89 114  --  95  --   --   --   --   --   --   BILITOT 0.9 1.0  --  0.6  --   --   --   --   --   --   PROT 5.8* 6.3*  --  5.0*  --   --   --   --   --   --   ALBUMIN 2.7* 2.5*   < > 2.4*  2.4*   < > 2.3* 2.4* 2.4* 2.4* 2.3*   < > = values in this interval not displayed.   No results for input(s): LIPASE, AMYLASE in the last 168 hours. No results for input(s): AMMONIA in the last 168 hours.  CBC: Recent Labs  Lab 07/09/2018 1933 07/16/18 0536 07/17/18 0407 07/18/18 0600 07/19/18 0517 07/20/18 0531  WBC 5.8 6.8 9.2 12.9* 12.1* 12.5*  NEUTROABS 3.7  --   --   --   --   --   HGB 9.9* 9.5* 10.2* 10.2* 9.0* 8.9*  HCT 35.3* 31.9* 33.9* 34.5* 30.8* 30.2*  MCV 99.2 91.9 90.6 93.2 93.9 93.5  PLT  161 167 179 257 190 132*    Cardiac Enzymes: Recent Labs  Lab 07/20/2018 1933 07/16/18 0118 07/16/18 1217 07/17/18 0218  TROPONINI 0.03* 0.87* 1.48* 0.54*    BNP: Invalid input(s): POCBNP  CBG: Recent Labs  Lab 07/19/18 1524 07/19/18 1959 07/19/18 2344 07/20/18 0319 07/20/18 0713  GLUCAP 115* 121* 139* 114* 133*    Microbiology: Results for orders placed or performed during the hospital encounter of 06/29/2018  Aerobic/Anaerobic Culture (surgical/deep wound)     Status: None (Preliminary result)   Collection Time: 07/13/2018  6:20 PM  Result Value Ref Range Status   Specimen Description   Final    WOUND RIGHT KNEE Performed at [facility], [place] [address], [place], [place] [address]    Special Requests   Final    NONE Performed at [facility], [address] [place], [place] [address]    Gram Stain   Final    FEW WBC PRESENT, PREDOMINANTLY PMN NO ORGANISMS SEEN Performed at [facility], [place] [address], [place], [place] [address]    Culture   Final    RARE PSEUDOMONAS AERUGINOSA NO ANAEROBES ISOLATED; CULTURE IN PROGRESS FOR 5 DAYS    Report Status PENDING  Incomplete   Organism ID, Bacteria PSEUDOMONAS  AERUGINOSA  Final      Susceptibility   Pseudomonas aeruginosa - MIC*    CEFTAZIDIME 2 SENSITIVE Sensitive     CIPROFLOXACIN <=0.25 SENSITIVE Sensitive     GENTAMICIN <=1 SENSITIVE Sensitive     IMIPENEM 1 SENSITIVE Sensitive     PIP/TAZO <=4 SENSITIVE Sensitive     CEFEPIME <=1 SENSITIVE Sensitive     * RARE PSEUDOMONAS AERUGINOSA  CULTURE, BLOOD (ROUTINE X 2) w Reflex to ID Panel     Status: None   Collection Time: 07/14/2018 10:03 PM  Result Value Ref Range Status   Specimen Description BLOOD RIGHT CENTRAL LINE   Final   Special Requests   Final    BOTTLES DRAWN AEROBIC AND ANAEROBIC Blood Culture results may not be optimal due to an excessive volume of blood received in culture bottles   Culture   Final    NO GROWTH 5 DAYS Performed at [facility], [address] [place], [place] [address]    Report Status 07/20/2018 FINAL  Final  Culture, Urine     Status: Abnormal   Collection Time: 06/27/2018 10:03 PM  Result Value Ref Range Status   Specimen Description URINE, RANDOM  Final   Special Requests   Final    Normal Performed at [facility], [place], [place] [address]    Culture 10,000 COLONIES/mL ENTEROCOCCUS FAECALIS (A)  Final   Report Status 07/18/2018 FINAL  Final   Organism ID, Bacteria ENTEROCOCCUS FAECALIS (A)  Final      Susceptibility   Enterococcus faecalis - MIC*    AMPICILLIN <=2 SENSITIVE Sensitive     LEVOFLOXACIN 1 SENSITIVE Sensitive     NITROFURANTOIN <=16 SENSITIVE Sensitive     VANCOMYCIN 1 SENSITIVE Sensitive     * 10,000 COLONIES/mL ENTEROCOCCUS FAECALIS  CULTURE, BLOOD (ROUTINE X 2) w Reflex to ID Panel     Status: None   Collection Time: 06/25/2018 10:32 PM  Result Value Ref Range Status   Specimen Description BLOOD BLOOD RIGHT HAND  Final   Special Requests   Final    BOTTLES DRAWN AEROBIC AND ANAEROBIC Blood Culture adequate volume   Culture   Final  NO GROWTH 5 DAYS Performed at [facility], [address] [place], [place] [address]    Report Status 07/20/2018 FINAL  Final    Coagulation Studies: No results for input(s): LABPROT, INR in the last 72 hours.  Urinalysis: No results for input(s): COLORURINE, LABSPEC, PHURINE, GLUCOSEU, HGBUR, BILIRUBINUR, KETONESUR, PROTEINUR, UROBILINOGEN, NITRITE, LEUKOCYTESUR in the last 72 hours.  Invalid input(s): APPERANCEUR    Imaging: Dg Chest Port 1 View  Result Date: 07/19/2018 CLINICAL DATA:  Respiratory failure EXAM: PORTABLE CHEST 1 VIEW COMPARISON:  Chest x-rays dated 07/18/2018 and 07/17/2018. FINDINGS: Endotracheal tube is stable in position with tip 3-4 cm above the carina. Enteric tube passes below the diaphragm. Heart size and mediastinal contours appear stable. Persistent opacity at the LEFT lung base, likely a combination of atelectasis and small pleural effusion. No pneumothorax. IMPRESSION: Stable chest. Electronically Signed   By: Franki Cabot M.D.   On: 07/19/2018 05:58     Medications:   . ceFEPime (MAXIPIME) IV Stopped (07/20/18 0600)  . feeding supplement (NEPRO CARB STEADY) 1,000 mL (07/18/18 1035)  . norepinephrine (LEVOPHED) Adult infusion 8 mcg/min (07/20/18 0900)  . potassium PHOSPHATE IVPB (mEq) 43 mL/hr at 07/20/18 0900  . propofol (DIPRIVAN) infusion 45 mcg/kg/min (07/19/18 2113)  . pureflow 3 each (07/19/18 1351)   . B-complex with vitamin C  1 tablet Per Tube Daily  . chlorhexidine gluconate (MEDLINE KIT)  15 mL Mouth Rinse BID  . epoetin (EPOGEN/PROCRIT) injection  10,000 Units Subcutaneous Q M,W,F  . famotidine  20 mg Per Tube Daily  . feeding supplement (PRO-STAT SUGAR FREE 64)  30 mL Per Tube TID  . free water  30 mL Per Tube Q4H  . heparin injection (subcutaneous)  5,000 Units Subcutaneous Q12H  . hydrocortisone sod succinate (SOLU-CORTEF) inj  50 mg Intravenous Q8H  . insulin aspart  0-15 Units Subcutaneous Q4H  . mouth rinse  15 mL Mouth Rinse 10 times per day  . vitamin C  250 mg Per Tube  BID   fentaNYL (SUBLIMAZE) injection, fentaNYL (SUBLIMAZE) injection, heparin, ipratropium-albuterol  Assessment/ Plan:  Jimmy Carson is a 78 y.o. white male with ESRD, hypothyroidism, hypertension, BPH, CAD status post CABG, hyperlipidemia, right total knee revision arthroplasty May 22, 2018 Dr. Marry Guan. Wound dehiscence on 1/22. Cardiac arrest post procedure on 07/12/2018.   [address]/[facility] neph[facility]/MWF  1. End-stage renal disease:   - continued on pressors;  - CVVHD. Continue ultrafiltration - increase UF rate to 100 / hr  2. Acute resp failure - Respiratory failure requiring mechanical ventilation.  - Requiring vasopressors. Goal HR >60, goal MAP >65 - stress dose steroids  3. Sepsis with Lactic acidosis: Rt knee wound culture with pseudomonas on 1/22 - cefepime.  - Warming blanket  4. Anemia of chronic kidney disease: hemoglobin 8.9 - Will continue EPO; dose once a week. May be less effective with current infection   LOS: [place] 1/27/202010:06 AM

## 2018-07-20 NOTE — Progress Notes (Signed)
PULMONARY / CRITICAL CARE MEDICINE   Name: Jimmy Carson MRN: [id number] DOB: [birth year]    ADMISSION DATE:  07/11/2018 CONSULTATION DATE:  07/13/2018  REFERRING MD:  Dr. Marry Guan  CHIEF COMPLAINT: Brief cardiac arrest in OR  PT PROFILE: 33 M was undergoing elective I&D of Right knee wound.  At end of case, suffered bradycardia requiring brief CPR with persistent hypotension after resuscitation.  Remained intubated postprocedure and transferred to ICU   CC  resp failure  HPI Remains on vent Agitated On pressors Remains critically ill   VITAL SIGNS: BP (!) 88/58   Pulse 72   Temp (!) 97.5 F (36.4 C)   Resp 17   Ht 5' 10"  (1.778 m)   Wt 82.2 kg   SpO2 100%   BMI 26.00 kg/m   HEMODYNAMICS:    VENTILATOR SETTINGS: Vent Mode: PRVC FiO2 (%):  [40 %] 40 % Set Rate:  [14 bmp] 14 bmp Vt Set:  [500 mL-511 mL] 511 mL PEEP:  [5 cmH20] 5 cmH20 Plateau Pressure:  [17 cmH20-18 cmH20] 18 cmH20  INTAKE / OUTPUT: I/O last 3 completed shifts: In: 1925.9 [I.V.:685.7; NG/GT:840; IV Piggyback:400.2] Out: 2394 [Other:2394]  REVIEW OF SYSTEMS  PATIENT IS UNABLE TO PROVIDE COMPLETE REVIEW OF SYSTEM S DUE TO SEVERE CRITICAL ILLNESS AND ENCEPHALOPATHY    PHYSICAL EXAMINATION:  GENERAL:critically ill appearing, +resp distress HEAD: Normocephalic, atraumatic.  EYES: Pupils equal, round, reactive to light.  No scleral icterus.  MOUTH: Moist mucosal membrane. NECK: Supple. No thyromegaly. No nodules. No JVD.  PULMONARY: +rhonchi, +wheezing CARDIOVASCULAR: S1 and S2. Regular rate and rhythm. No murmurs, rubs, or gallops.  GASTROINTESTINAL: Soft, nontender, -distended. No masses. Positive bowel sounds. No hepatosplenomegaly.  MUSCULOSKELETAL: No swelling, clubbing, or edema.  NEUROLOGIC: obtunded SKIN:intact,warm,dry   LABS:  BMET Recent Labs  Lab 07/19/18 2240 07/20/18 0206 07/20/18 0531  NA 137 137 137  K 3.5 3.4* 3.6  CL 104 106 104  CO2 27 27 29   BUN 25*  24* 24*  CREATININE 1.77* 1.64* 1.63*  GLUCOSE 130* 124* 128*    Electrolytes Recent Labs  Lab 07/19/18 2240 07/20/18 0206 07/20/18 0531  CALCIUM 9.0 8.6* 9.0  MG 2.1 1.9 2.0  PHOS 2.0* 1.7* 1.8*    CBC Recent Labs  Lab 07/18/18 0600 07/19/18 0517 07/20/18 0531  WBC 12.9* 12.1* 12.5*  HGB 10.2* 9.0* 8.9*  HCT 34.5* 30.8* 30.2*  PLT 257 190 132*    Coag's Recent Labs  Lab 07/14/18 1056 07/17/18 0407  APTT  --  42*  INR 1.21  --     Sepsis Markers Recent Labs  Lab 07/23/2018 1933 07/14/2018 1936 07/06/2018 2203 07/16/18 0448 07/17/18 0407  LATICACIDVEN  --  5.5* 3.1*  --   --   PROCALCITON 0.46  --   --  12.68 15.41    ABG Recent Labs  Lab 07/07/2018 1930 07/16/18 0500  PHART 7.29* 7.48*  PCO2ART 44 37  PO2ART 302* 177*    Liver Enzymes Recent Labs  Lab 07/16/2018 1933 07/16/18 0448  07/19/18 0517  07/19/18 2240 07/20/18 0206 07/20/18 0531  AST 43* 76*  --  20  --   --   --   --   ALT 19 39  --  17  --   --   --   --   ALKPHOS 89 114  --  95  --   --   --   --   BILITOT 0.9 1.0  --  0.6  --   --   --   --  ALBUMIN 2.7* 2.5*   < > 2.4*  2.4*   < > 2.4* 2.4* 2.3*   < > = values in this interval not displayed.    Cardiac Enzymes Recent Labs  Lab 07/16/18 0118 07/16/18 1217 07/17/18 0218  TROPONINI 0.87* 1.48* 0.54*    Glucose Recent Labs  Lab 07/19/18 1126 07/19/18 1524 07/19/18 1959 07/19/18 2344 07/20/18 0319 07/20/18 0713  GLUCAP 128* 115* 121* 139* 114* 133*    CXR: No significant change    STUDIES:  Echocardiogram 1/23: LVEF 40-45%.  Mild MR.  Status post AoVR Venous Ultrasound bilateral LE 1/23>> No evidence of deep venous thrombosis. Limited evaluation of left saphenofemoral junction due to groin bandage   CULTURES: Wound R Knee 1/22 >> rare Pseudomonas, pansensitive Blood 1/22 >> NEG Urine 1/22>> 10k enterococcus  ANTIBIOTICS: Clindamycin 1/22 >> 1/23 Vancomycin 1/22 >> 1/23 Cefepime 1/22 >>   SIGNIFICANT  EVENTS: 1/22>> Elective I&D of Right knee 1/22>> Hypotension at end of procedure, required transfer to ICU 1/27 remains on vent  LINES/TUBES: ETT 1/22 >>    Right Femoral CVC 1/22 >>   L femoral HD cath 1/23 >>   ASSESSMENT / PLAN:  Admitted to ICU s/p cardiac arrest s/p RT knee surgery with infected arthritis with severe shock with multiorgan failure   CARDIAC FAILURE-cardiac arrest  Severe ischemic cardiomyopathy-EF 40% NEW LBBB -follow up cardiology recs  Severe Hypoxic and Hypercapnic Respiratory Failure from pulm edema and cardiac arrest -continue Mechanical Ventilator support -continue Bronchodilator Therapy -Wean Fio2 and PEEP as tolerated -VAP/VENT bundle implementation -will perform SAT/SBT when respiratory parameters are met   Cardiogenic shock -use vasopressors to keep MAP>65 -follow ABG and LA -follow up cultures -consider stress dose steroids   Renal Failure- -follow chem 7 -follow UO -continue Foley Catheter-assess need Continue CRRT   GI/Nutrition GI PROPHYLAXIS as indicated DIET-->TF's as tolerated Constipation protocol as indicated  ELECTROLYTES -follow labs as needed -replace as needed -pharmacy consultation and following    DVT/GI PRX ordered TRANSFUSIONS AS NEEDED MONITOR FSBS ASSESS the need for LABS as needed    INFECTIOUS DISEASE -continue antibiotics as prescribed -follow up cultures -follow up ID consultation Septic arthritis -pseudomonas     NEUROLOGY - intubated and sedated - minimal sedation to achieve a RASS goal: -1     FAMILY  Patient is DNR Will need to update family-recommend one way extubation   Critical Care Time devoted to patient care services described in this note is 45 minutes.   Overall, patient is critically ill, prognosis is guarded.  Patient with Multiorgan failure and at high risk for cardiac arrest and death.    Corrin Parker, M.D.  [facility]   Medical Director [place] Director [facility] Department

## 2018-07-21 DIAGNOSIS — R451 Restlessness and agitation: Secondary | ICD-10-CM

## 2018-07-21 DIAGNOSIS — R0609 Other forms of dyspnea: Secondary | ICD-10-CM

## 2018-07-21 DIAGNOSIS — E440 Moderate protein-calorie malnutrition: Secondary | ICD-10-CM

## 2018-07-21 LAB — RENAL FUNCTION PANEL
Albumin: 2.2 g/dL — ABNORMAL LOW (ref 3.5–5.0)
Albumin: 2.3 g/dL — ABNORMAL LOW (ref 3.5–5.0)
Anion gap: 7 (ref 5–15)
Anion gap: 3 — ABNORMAL LOW (ref 5–15)
BUN: 26 mg/dL — ABNORMAL HIGH (ref 8–23)
BUN: 27 mg/dL — AB (ref 8–23)
CO2: 28 mmol/L (ref 22–32)
CO2: 28 mmol/L (ref 22–32)
Calcium: 9.1 mg/dL (ref 8.9–10.3)
Calcium: 9 mg/dL (ref 8.9–10.3)
Chloride: 101 mmol/L (ref 98–111)
Chloride: 104 mmol/L (ref 98–111)
Creatinine, Ser: 1.46 mg/dL — ABNORMAL HIGH (ref 0.61–1.24)
Creatinine, Ser: 1.49 mg/dL — ABNORMAL HIGH (ref 0.61–1.24)
GFR calc Af Amer: 53 mL/min — ABNORMAL LOW (ref >60)
GFR calc Af Amer: 52 mL/min — ABNORMAL LOW (ref >60)
GFR calc non Af Amer: 46 mL/min — ABNORMAL LOW (ref >60)
GFR calc non Af Amer: 45 mL/min — ABNORMAL LOW (ref >60)
GLUCOSE: 122 mg/dL — AB (ref 70–99)
GLUCOSE: 123 mg/dL — AB (ref 70–99)
Phosphorus: 1.9 mg/dL — ABNORMAL LOW (ref 2.5–4.6)
Phosphorus: 2.2 mg/dL — ABNORMAL LOW (ref 2.5–4.6)
Potassium: 3.8 mmol/L (ref 3.5–5.1)
Potassium: 4 mmol/L (ref 3.5–5.1)
Sodium: 136 mmol/L (ref 135–145)
Sodium: 135 mmol/L (ref 135–145)

## 2018-07-21 LAB — AEROBIC/ANAEROBIC CULTURE (SURGICAL/DEEP WOUND)

## 2018-07-21 LAB — AEROBIC/ANAEROBIC CULTURE W GRAM STAIN (SURGICAL/DEEP WOUND)

## 2018-07-21 LAB — GLUCOSE, CAPILLARY
Glucose-Capillary: 120 mg/dL — ABNORMAL HIGH (ref 70–99)
Glucose-Capillary: 121 mg/dL — ABNORMAL HIGH (ref 70–99)
Glucose-Capillary: 104 mg/dL — ABNORMAL HIGH (ref 70–99)

## 2018-07-21 LAB — MAGNESIUM
Magnesium: 2.1 mg/dL (ref 1.7–2.4)
Magnesium: 1.9 mg/dL (ref 1.7–2.4)

## 2018-07-21 MED ORDER — ALTEPLASE 2 MG IJ SOLR
2.0000 mg | Freq: Once | INTRAMUSCULAR | Status: AC
  Administered 2018-07-21: 2 mg
  Filled 2018-07-21: qty 2

## 2018-07-21 MED ORDER — LORAZEPAM 2 MG/ML IJ SOLN: 1.0000 mg | INTRAMUSCULAR | Status: DC | PRN

## 2018-07-21 MED ORDER — MORPHINE BOLUS VIA INFUSION
1.0000 mg | INTRAVENOUS | Status: DC | PRN
  Administered 2018-07-21: 1 mg via INTRAVENOUS
  Filled 2018-07-21: qty 1

## 2018-07-21 MED ORDER — GLYCOPYRROLATE 0.2 MG/ML IJ SOLN
0.2000 mg | Freq: Three times a day (TID) | INTRAMUSCULAR | Status: DC
  Administered 2018-07-22 (×2): 0.2 mg via INTRAVENOUS
  Filled 2018-07-21 (×2): qty 1

## 2018-07-21 MED ORDER — STERILE WATER FOR INJECTION IJ SOLN
INTRAMUSCULAR | Status: AC
  Filled 2018-07-21: qty 10

## 2018-07-21 MED ORDER — MORPHINE 100MG IN NS 100ML (1MG/ML) PREMIX INFUSION
1.0000 mg/h | INTRAVENOUS | Status: DC
  Administered 2018-07-21: 4 mg/h via INTRAVENOUS
  Administered 2018-07-21 – 2018-07-22 (×2): 10 mg/h via INTRAVENOUS
  Filled 2018-07-21 (×3): qty 100

## 2018-07-21 MED ORDER — LORAZEPAM 2 MG/ML IJ SOLN
1.0000 mg | INTRAMUSCULAR | Status: DC | PRN
  Filled 2018-07-21: qty 1

## 2018-07-21 NOTE — Progress Notes (Signed)
PULMONARY / CRITICAL CARE MEDICINE   Name: Jimmy Carson MRN: [id number] DOB: December 31, 1940    ADMISSION DATE:  07/02/2018 CONSULTATION DATE:  07/24/2018  REFERRING MD:  Dr. Marry Guan  CHIEF COMPLAINT: Brief cardiac arrest in OR  PT PROFILE: 40 M was undergoing elective I&D of Right knee wound.  At end of case, suffered bradycardia requiring brief CPR with persistent hypotension after resuscitation.  Remained intubated postprocedure and transferred to ICU   CC  Follow up resp failure  HPI Remains on vent Increased WOB Severe agitation using accessory muscles to breathe Severe resp failure critically ill +vomiting TF's on hold  VITAL SIGNS: BP 128/69   Pulse 70   Temp (!) 96.1 F (35.6 C)   Resp (!) 21   Ht 5\' 10"  (1.778 m)   Wt 79.4 kg   SpO2 96%   BMI 25.12 kg/m   HEMODYNAMICS:    VENTILATOR SETTINGS: Vent Mode: PRVC FiO2 (%):  [30 %-40 %] 30 % Set Rate:  [14 bmp] 14 bmp Vt Set:  [500 mL] 500 mL PEEP:  [5 cmH20] 5 cmH20 Plateau Pressure:  [18 cmH20-22 cmH20] 22 cmH20  INTAKE / OUTPUT: I/O last 3 completed shifts: In: 2284.2 [I.V.:844.6; NG/GT:884.9; IV Piggyback:554.7] Out: 2987 [Other:2987]  REVIEW OF SYSTEMS  PATIENT IS UNABLE TO PROVIDE COMPLETE REVIEW OF SYSTEM S DUE TO SEVERE CRITICAL ILLNESS AND ENCEPHALOPATHY   PHYSICAL EXAMINATION:  GENERAL:critically ill appearing, +resp distress HEAD: Normocephalic, atraumatic.  EYES: Pupils equal, round, reactive to light.  No scleral icterus.  MOUTH: Moist mucosal membrane. NECK: Supple. No thyromegaly. No nodules. No JVD.  PULMONARY: +rhonchi, +wheezing CARDIOVASCULAR: S1 and S2. Regular rate and rhythm. No murmurs, rubs, or gallops.  GASTROINTESTINAL: Soft, nontender, +distended. No masses. Positive bowel sounds. No hepatosplenomegaly.  MUSCULOSKELETAL: No swelling, clubbing, or edema.  NEUROLOGIC: obtunded SKIN:intact,warm,dry    LABS:  BMET Recent Labs  Lab 07/20/18 2256 07/21/18 0157  07/21/18 0551  NA 135 136 135  K 3.8 3.8 4.0  CL 101 101 104  CO2 26 28 28   BUN 27* 26* 27*  CREATININE 1.59* 1.46* 1.49*  GLUCOSE 126* 122* 123*    Electrolytes Recent Labs  Lab 07/20/18 2256 07/21/18 0157 07/21/18 0551  CALCIUM 8.9 9.1 9.0  MG 2.0 2.1 1.9  PHOS 2.7 1.9* 2.2*    CBC Recent Labs  Lab 07/18/18 0600 07/19/18 0517 07/20/18 0531  WBC 12.9* 12.1* 12.5*  HGB 10.2* 9.0* 8.9*  HCT 34.5* 30.8* 30.2*  PLT 257 190 132*    Coag's Recent Labs  Lab 07/14/18 1056 07/17/18 0407  APTT  --  42*  INR 1.21  --     Sepsis Markers Recent Labs  Lab 07/16/2018 1933 06/29/2018 1936 07/08/2018 2203 07/16/18 0448 07/17/18 0407  LATICACIDVEN  --  5.5* 3.1*  --   --   PROCALCITON 0.46  --   --  12.68 15.41    ABG Recent Labs  Lab 06/25/2018 1930 07/16/18 0500  PHART 7.29* 7.48*  PCO2ART 44 37  PO2ART 302* 177*    Liver Enzymes Recent Labs  Lab 07/09/2018 1933 07/16/18 0448  07/19/18 0517  07/20/18 2256 07/21/18 0157 07/21/18 0551  AST 43* 76*  --  20  --   --   --   --   ALT 19 39  --  17  --   --   --   --   ALKPHOS 89 114  --  95  --   --   --   --  BILITOT 0.9 1.0  --  0.6  --   --   --   --   ALBUMIN 2.7* 2.5*   < > 2.4*  2.4*   < > 2.1* 2.2* 2.3*   < > = values in this interval not displayed.    Cardiac Enzymes Recent Labs  Lab 07/16/18 0118 07/16/18 1217 07/17/18 0218  TROPONINI 0.87* 1.48* 0.54*    Glucose Recent Labs  Lab 07/20/18 1130 07/20/18 1539 07/20/18 1943 07/20/18 2323 07/21/18 0347 07/21/18 0743  GLUCAP 149* 135* 126* 124* 120* 121*    CXR: No significant change    STUDIES:  Echocardiogram 1/23: LVEF 40-45%.  Mild MR.  Status post AoVR Venous Ultrasound bilateral LE 1/23>> No evidence of deep venous thrombosis. Limited evaluation of left saphenofemoral junction due to groin bandage   CULTURES: Wound R Knee 1/22 >> rare Pseudomonas, pansensitive Blood 1/22 >> NEG Urine 1/22>> 10k  enterococcus  ANTIBIOTICS: Clindamycin 1/22 >> 1/23 Vancomycin 1/22 >> 1/23 Cefepime 1/22 >>   SIGNIFICANT EVENTS: 1/22>> Elective I&D of Right knee 1/22>> Hypotension at end of procedure, required transfer to ICU 1/27 remains on vent  LINES/TUBES: ETT 1/22 >>    Right Femoral CVC 1/22 >>   L femoral HD cath 1/23 >>   ASSESSMENT / PLAN:  Admitted to ICU s/p cardiac arrest s/p RT knee surgery with possible infected joint with severe cardiogenic shock with multiorgan failure  Severe Hypoxic and Hypercapnic Respiratory Failure -continue Mechanical Ventilator support -continue Bronchodilator Therapy -Wean Fio2 and PEEP as tolerated -VAP/VENT bundle implementation Unable to wean from vent   CARDIAC FAILURE-cardiac arrest Severe ischemic cardiomyopathy EF 40% Follow up cardiology recs NEW LBBB  Cardiogenic shock -use vasopressors to keep MAP>65 -consider stress dose steroids  Renal Failure-on CRRT -follow chem 7 -follow UO -continue Foley Catheter-assess need    GI/Nutrition GI PROPHYLAXIS as indicated DIET-->hold TF's Constipation protocol as indicated  ELECTROLYTES -follow labs as needed -replace as needed -pharmacy consultation and following     DVT/GI PRX ordered TRANSFUSIONS AS NEEDED MONITOR FSBS ASSESS the need for LABS as needed   INFECTIOUS DISEASE -continue antibiotics as prescribed -follow up cultures -follow up ID consultation ?septic arthritis-pseudomonas    NEUROLOGY - intubated and sedated - minimal sedation to achieve a RASS goal: -1    FAMILY  Patient is DNR Will need to update family-recommend one way extubation Palliative care consultation apprecaited    Critical Care Time devoted to patient care services described in this note is 43 minutes.   Overall, patient is critically ill, prognosis is guarded.  Patient with Multiorgan failure and at high risk for cardiac arrest and death.    Overall poor  prognosis    Corrin Parker, M.D.  [facility]  Medical Director [place] Director [facility] Cardio-Pulmonary Department

## 2018-07-21 NOTE — Progress Notes (Signed)
   Subjective: 6 Days Post-Op Procedure(s) (LRB): IRRIGATION AND DEBRIDEMENT AT CLOSURE OF RIGHT KNEE WOUND (Right) Patient not able to communicate in regards to pain level Patient is well, and has had no acute complaints or problems Gentle range of motion right knee Plan is to go Rehab after hospital stay. no nausea and no vomiting Patient denies any chest pains or shortness of breath. Patient has been extubated and is breathing on his own at this time.  Does appear to have a lot of congestion. Appears at this time to be comfortable but nonresponsive Objective: Vital signs in last 24 hours: Temp:  [96.1 F (35.6 C)-98.2 F (36.8 C)] 98.1 F (36.7 C) (01/28 1100) Pulse Rate:  [62-99] 74 (01/28 1100) Resp:  [15-21] 19 (01/28 1100) BP: (73-144)/(47-76) 107/64 (01/28 1100) SpO2:  [89 %-100 %] 97 % (01/28 1100) FiO2 (%):  [30 %-40 %] 40 % (01/28 0900) Weight:  [79.4 kg] 79.4 kg (01/28 0104) well approximated incision Heels are non tender and elevated off the bed using rolled towels Intake/Output from previous day: 01/27 0701 - 01/28 0700 In: 2063.4 [I.V.:724.1; NG/GT:884.9; IV Piggyback:454.4] Out: 2119  Intake/Output this shift: No intake/output data recorded.  Recent Labs    07/19/18 0517 07/20/18 0531  HGB 9.0* 8.9*   Recent Labs    07/19/18 0517 07/20/18 0531  WBC 12.1* 12.5*  RBC 3.28* 3.23*  HCT 30.8* 30.2*  PLT 190 132*   Recent Labs    07/21/18 0157 07/21/18 0551  NA 136 135  K 3.8 4.0  CL 101 104  CO2 28 28  BUN 26* 27*  CREATININE 1.46* 1.49*  GLUCOSE 122* 123*  CALCIUM 9.1 9.0   No results for input(s): LABPT, INR in the last 72 hours.  EXAM General - Patient is not communicable Extremity - Intact pulses distally No cellulitis present Compartment soft Dressing - scant drainage Motor Function - intact, moving foot and toes well on exam.    Past Medical History:  Diagnosis Date  . Anemia   . CAD (coronary artery disease)    s/p  percutaneous coronary angioplasty, s/p CABG x 6   . ESRD (end stage renal disease) ([place])   . Heart attack ([place]) 2012  . History of radiation therapy    for prostate CA  . Hyperlipidemia   . Hyperparathyroidism, secondary renal ([place])   . Hypertension   . Osteoarthritis   . Polycystic kidney disease   . Prostate CA [facility]) 2012   Brachytherapy  . Renal insufficiency    currently on dialysis  . Shingles     Assessment/Plan: 6 Days Post-Op Procedure(s) (LRB): IRRIGATION AND DEBRIDEMENT AT CLOSURE OF RIGHT KNEE WOUND (Right) Active Problems:   Respiratory failure (HCC)   Malnutrition of moderate degree   Palliative care by specialist   DNR (do not resuscitate)   Edema of both lower extremities  Estimated body mass index is 25.12 kg/m as calculated from the following:   Height as of this encounter: 5\' 10"  (1.778 m).   Weight as of this encounter: 79.4 kg.   Dressing was changed today DVT Prophylaxis - TED hose and Heparin D/C O2 and Pulse OX and try on Room Air   R. [place] 07/21/2018, 12:06 PM

## 2018-07-21 NOTE — Progress Notes (Signed)
Girlfriend and Daughter have consented to Comfort care measures. Mop rhine started-titrate to comfort      Corrin Parker, M.D.  [facility]  Medical Director [place] Director [facility] Cardio-Pulmonary Department

## 2018-07-21 NOTE — Progress Notes (Signed)
Pt extubated per MD order, pt tol well. Pt placed on 2Lnc.

## 2018-07-21 NOTE — Progress Notes (Signed)
Pt lying in bed resting no S/S of pain or distressnoted with family at bedside grieving appropriately. Morphine at 10mg  /hr .

## 2018-07-21 NOTE — Progress Notes (Signed)
Nutrition Brief Note  Chart reviewed. Patient now transitioning to comfort care.   No further nutrition interventions warranted at this time. Please re-consult RD as needed.   Willey Blade, MS, [place], LDN Office: [phone] Pager: [phone] After Hours/Weekend Pager: [phone]

## 2018-07-21 NOTE — Progress Notes (Signed)
Patient extubated to nasal cannula, per MD order and HCPOA decision.  One way extubation tolerated.  Patient not following commands, but moving all extremities.  Will continue to monitor.

## 2018-07-21 NOTE — Progress Notes (Signed)
Patient ID: Jimmy Carson, male   DOB: February 14, 1941, 78 y.o.   MRN: [id number]  This NP visited patient at the bedside as a follow up to  yesterday's [place] for palliative medicine needs and emotional support, specifically to meet with his SO/Jimmy Carson and daughter/Jimmy Carson; patient was weaned from ventilator this morning.  Discussion regarding current medical situation, multiple comorbidities and high risk for decompensation. Discussed the many possible outcomes over the next hours to days.  We discussed concepts specific to continuation of dialysis and artificial feeding. Currently watchful waiting; however if patient decompensates comfort measures should be implemented.  Returned shortly (30 minutes) after above conversation; nurse reporting arrhythmias and rapid decompensation.  We discussed shift to a full comfort path at this time and wife and SO  at the bedside are in agreement and "only want him to be comfortable"  Plan of care: -DNR/DNI -Comfort, quality and dignity are the main focus of care -No further life prolonging measures, no diagnostics, no antibiotics, no dialysis -Symptom  management to enhance comfort     -Initiate continuous morphine drip, with prn boluses     -Ativan and Robinul as needed -Prognosis is likely hours to days  Discussed natural trajectory and expectations at EOL  Questions and concerns addressed   Discussed with Dr Mortimer Fries and Dr Candiss Norse  Total time spent on the unit was 60 minutes  PMT will continue to support holistically  Greater than 50% of the time was spent in counseling and coordination of care  Wadie Lessen NP  Palliative Medicine Team Team Phone # [phone] Pager [phone]

## 2018-07-22 DIAGNOSIS — J9611 Chronic respiratory failure with hypoxia: Secondary | ICD-10-CM

## 2018-07-24 ENCOUNTER — Telehealth: Payer: Self-pay | Admitting: Internal Medicine

## 2018-07-24 NOTE — Telephone Encounter (Signed)
Called funeral home and notified death certificate ready for pickup.

## 2018-07-24 NOTE — Telephone Encounter (Signed)
Received death certificate. Placed in DK box for signing.

## 2018-07-24 NOTE — Telephone Encounter (Signed)
Death cert. Faxed over per [employer]. Hard copy placed back in box for pick up.

## 2018-07-25 NOTE — Progress Notes (Signed)
Spoke with Doy Hutching ME last night at [place] about deceased to see if he would qualify as an ME case. He spoke with [place] and it was declined as an ME case at 2140.

## 2018-07-25 NOTE — Progress Notes (Signed)
Patient ID: Jimmy Carson, male   DOB: 01/27/1941, 78 y.o.   MRN: [id number]  This NP visited patient at the bedside as a follow up  for palliative medicine needs and emotional support,  his SO/Gloria and daughter/Leah are at the bedside.     Patient was weaned from ventilator yesterday, converted to comfort care, currently is on morphine drip and appears comfortable.  He is having periods of apnea, feet are cool and mottled and lips are cyanotic.  He is transitioning at end of life and prognosis is likely hours to days.  Discussed natural trajectory and expectations at EOL.   Questions and concerns addressed    Plan of care: -DNR/DNI -Comfort, quality and dignity are the main focus of care -No further life prolonging measures, no diagnostics, no antibiotics, no dialysis -Symptom  management to enhance comfort     -Initiate continuous morphine drip, with prn boluses     -Ativan and Robinul as needed -Prognosis is likely hours to days  Patient's daughter and significant other are present at bedside they share with each other stories and their love for [name]  Total time spent on the unit was 35 minutes  PMT will continue to support holistically  Greater than 50% of the time was spent in counseling and coordination of care  Wadie Lessen NP  Palliative Medicine Team Team Phone # [phone] Pager [phone]

## 2018-07-25 NOTE — Progress Notes (Signed)
Pt remains comfortable no distress noted with family at bedside

## 2018-07-25 NOTE — Progress Notes (Signed)
Pastoral Care - Decedent Care  To Fam    08-Aug-2018 1500  Clinical Encounter Type  Visited With Family  Visit Type Follow-up  Referral From Nurse  Consult/Referral To Chaplain  Spiritual Encounters  Spiritual Needs Grief support   Darcey Nora, Chaplain

## 2018-07-25 NOTE — Progress Notes (Signed)
   Aug 05, 2018 1400  Clinical Encounter Type  Visited With Family  Visit Type Death  Referral From Nurse  Consult/Referral To Chaplain  Spiritual Encounters  Spiritual Needs Grief support  Chaplain received page of patient death. Chaplain arrived at room and nurse ask Chaplain to wait here for girlfriend, she went to meet daughter and she did not want the patient to be alone. Chaplain waited and daughter and girlfriend arrive back and she gave her condolence and allowed the family to embrace the patient. Chaplain encourage family and gave emotional support. Daughter ask Bonney Roussel would she feel uncomfortable if she took pictures, would it offend her and Chaplain said no. Chaplain asked if she could pray with them and she would leave the alone with patient. Daughter said she would love that. Chaplain prayed and gave them a hug and daughter and girlfriend thanked the [place].

## 2018-07-25 NOTE — Progress Notes (Signed)
   Subjective: 7 Days Post-Op Procedure(s) (LRB): IRRIGATION AND DEBRIDEMENT AT CLOSURE OF RIGHT KNEE WOUND (Right) Patient not able to communicate in regards to pain level Patient is well, and has had no acute complaints or problems Physical therapy consisted to some range of motion of the knee either by therapist or nursing. Plan is to go Rehab after hospital stay. no nausea and no vomiting Patient denies any chest pains or shortness of breath. Patient currently on a morphine drip. Family members in room.  States that he seems to be resting well. No new complaints  Objective: Vital signs in last 24 hours: Temp:  [97.7 F (36.5 C)-98.2 F (36.8 C)] 98.1 F (36.7 C) (01/28 1100) Pulse Rate:  [74-114] 114 (01/28 1200) Resp:  [19-28] 25 (01/28 1300) BP: (73-118)/(50-87) 117/73 (01/28 1300) SpO2:  [89 %-99 %] 99 % (01/28 1200) FiO2 (%):  [40 %] 40 % (01/28 0900) well approximated incision Heels are non tender and elevated off the bed using rolled towels Intake/Output from previous day: 01/28 0701 - 01/29 0700 In: 28.6 [I.V.:28.6] Out: -  Intake/Output this shift: No intake/output data recorded.  Recent Labs    07/20/18 0531  HGB 8.9*   Recent Labs    07/20/18 0531  WBC 12.5*  RBC 3.23*  HCT 30.2*  PLT 132*   Recent Labs    07/21/18 0157 07/21/18 0551  NA 136 135  K 3.8 4.0  CL 101 104  CO2 28 28  BUN 26* 27*  CREATININE 1.46* 1.49*  GLUCOSE 122* 123*  CALCIUM 9.1 9.0   No results for input(s): LABPT, INR in the last 72 hours.  EXAM General - Patient has been extubated.  Appears to be resting comfortably.  Swelling and edema to both the upper and lower extremities seems to have improved significantly.  Does not appear to be as congested as he was yesterday. Extremity - Intact pulses distally No cellulitis present Compartment soft Dressing - dressing C/D/I Motor Function - intact, moving foot and toes well on exam.   Past Medical History:  Diagnosis  Date  . Anemia   . CAD (coronary artery disease)    s/p percutaneous coronary angioplasty, s/p CABG x 6   . ESRD (end stage renal disease) ([place])   . Heart attack ([place]) 2012  . History of radiation therapy    for prostate CA  . Hyperlipidemia   . Hyperparathyroidism, secondary renal ([place])   . Hypertension   . Osteoarthritis   . Polycystic kidney disease   . Prostate CA [facility]) 2012   Brachytherapy  . Renal insufficiency    currently on dialysis  . Shingles     Assessment/Plan: 7 Days Post-Op Procedure(s) (LRB): IRRIGATION AND DEBRIDEMENT AT CLOSURE OF RIGHT KNEE WOUND (Right) Active Problems:   Respiratory failure (HCC)   Malnutrition of moderate degree   Palliative care by specialist   DNR (do not resuscitate)   Edema of both lower extremities   Agitation  Estimated body mass index is 25.12 kg/m as calculated from the following:   Height as of this encounter: 5\' 10"  (1.778 m).   Weight as of this encounter: 79.4 kg.  Labs: No labs back today. DVT Prophylaxis - TED hose and Heparin D/C O2 and Pulse OX and try on Room Air We will continue to follow  Wille Glaser R. [place] August 07, 2018, 8:03 AM

## 2018-07-25 NOTE — Death Summary Note (Signed)
DEATH SUMMARY   Patient Details  Name: Jimmy Carson MRN: [id number] DOB: Dec 31, 1940  Admission/Discharge Information   Admit Date:  07-Aug-2018  Date of Death:  08/14/2018  Time of Death:  7  Length of Stay: Sep 21, 2022  Referring Physician: Dion Body, MD   Reason(s) for Hospitalization  Cardiac arrest s/p RT knee surgery  Diagnoses  Preliminary cause of death: ischemic cardiomyopathy  Secondary Diagnoses (including complications and co-morbidities):  Active Problems:   Respiratory failure (HCC)   Malnutrition of moderate degree   Palliative care by specialist   DNR (do not resuscitate)   Edema of both lower extremities   Agitation   Brief Hospital Course (including significant findings, care, treatment, and services provided and events leading to death)   SIGNIFICANT EVENTS: 1/22>> Elective I&D of Right knee 1/22>> Hypotension at end of procedure, CPR, cardiac arrest   required transfer to ICU 1/23-04/04/20 on vent, on CRRT, on vasopressors Failure to wean from vent 1/28-palliative consulted, DNR,  14-Aug-2022 extubated to comfort care measures  Family and girlfriend consented to DNR and one way extubation with intention of Comfort care measures   Pertinent Labs and Studies  Significant Diagnostic Studies Dg Chest 2 View  Result Date: 06/27/2018 CLINICAL DATA:  Left-sided pleural effusion. Follow-up. EXAM: CHEST - 2 VIEW COMPARISON:  06/26/2018 FINDINGS: Previous median sternotomy and CABG. Previous aortic valve replacement. Right chest remains clear. Persistent left effusion with atelectasis and or infiltrate in the left lower lung. Findings may be slightly worsened on the left. IMPRESSION: Persistent left effusion with atelectasis and or infiltrate in the left lower lung. Findings may be slightly worsened on the left. Electronically Signed   By: Nelson Chimes M.D.   On: 06/27/2018 08:15   Dg Chest 2 View  Result Date: 06/25/2018 CLINICAL DATA:  Shortness of breath  EXAM: CHEST - 2 VIEW COMPARISON:  06/11/2018 FINDINGS: Post sternotomy changes with valve prosthesis. Small left-sided pleural effusion, slightly increased compared to prior. Trace right pleural effusion. Cardiomegaly with vascular congestion. Patchy airspace disease at the left base. Aortic atherosclerosis. No pneumothorax. IMPRESSION: 1. Small left-sided pleural effusion, slight increase as compared to radiograph 06/11/2018. Trace right pleural effusion 2. Cardiomegaly with vascular congestion. Patchy airspace disease at the left lung base. Electronically Signed   By: Donavan Foil M.D.   On: 06/25/2018 18:50   US Venous Img Lower Bilateral  Result Date: 07/16/2018 CLINICAL DATA:  Bilateral leg swelling EXAM: BILATERAL LOWER EXTREMITY VENOUS DOPPLER ULTRASOUND TECHNIQUE: Gray-scale sonography with graded compression, as well as color Doppler and duplex ultrasound were performed to evaluate the lower extremity deep venous systems from the level of the common femoral vein and including the common femoral, femoral, profunda femoral, popliteal and calf veins including the posterior tibial, peroneal and gastrocnemius veins when visible. The superficial great saphenous vein was also interrogated. Spectral Doppler was utilized to evaluate flow at rest and with distal augmentation maneuvers in the common femoral, femoral and popliteal veins. COMPARISON:  06/05/2018 FINDINGS: RIGHT LOWER EXTREMITY Common Femoral Vein: No evidence of thrombus. Normal compressibility, respiratory phasicity and response to augmentation. Linear echogenicity in the right common femoral vein presumably representing central line. Saphenofemoral Junction: No evidence of thrombus. Normal compressibility and flow on color Doppler imaging. Profunda Femoral Vein: No evidence of thrombus. Normal compressibility and flow on color Doppler imaging. Femoral Vein: No evidence of thrombus. Normal compressibility, respiratory phasicity and response to  augmentation. Popliteal Vein: No evidence of thrombus. Normal compressibility, respiratory phasicity and response to augmentation.  Calf Veins: No evidence of thrombus. Normal compressibility and flow on color Doppler imaging. LEFT LOWER EXTREMITY Common Femoral Vein: No evidence of thrombus. Normal compressibility, respiratory phasicity and response to augmentation. Saphenofemoral Junction: Unable to be evaluated secondary to groin bandage. Profunda Femoral Vein: No evidence of thrombus. Normal compressibility and flow on color Doppler imaging. Femoral Vein: No evidence of thrombus. Normal compressibility, respiratory phasicity and response to augmentation. Popliteal Vein: No evidence of thrombus. Normal compressibility, respiratory phasicity and response to augmentation. Calf Veins: No evidence of thrombus. Normal compressibility and flow on color Doppler imaging. Other Findings:  None. IMPRESSION: No evidence of deep venous thrombosis. Limited evaluation of left saphenofemoral junction due to groin bandage. Electronically Signed   By: Donavan Foil M.D.   On: 07/16/2018 01:15   Dg Chest Port 1 View  Result Date: 07/19/2018 CLINICAL DATA:  Respiratory failure EXAM: PORTABLE CHEST 1 VIEW COMPARISON:  Chest x-rays dated 07/18/2018 and 07/17/2018. FINDINGS: Endotracheal tube is stable in position with tip 3-4 cm above the carina. Enteric tube passes below the diaphragm. Heart size and mediastinal contours appear stable. Persistent opacity at the LEFT lung base, likely a combination of atelectasis and small pleural effusion. No pneumothorax. IMPRESSION: Stable chest. Electronically Signed   By: Franki Cabot M.D.   On: 07/19/2018 05:58   Dg Chest Port 1 View  Result Date: 07/18/2018 CLINICAL DATA:  Respiratory failure EXAM: PORTABLE CHEST 1 VIEW COMPARISON:  Yesterday FINDINGS: Endotracheal tube tip just below the clavicular heads. The orogastric tube reaches the stomach at least. Cardiomegaly. CABG and  transcatheter aortic valve replacement. Continued retrocardiac opacity with pleural effusion that is small to moderate. Lung volumes are low. Fullness of the right hilum that is vascular based on priors. IMPRESSION: Stable hardware positioning and opacified left base with pleural effusion. Electronically Signed   By: Monte Fantasia M.D.   On: 07/18/2018 06:58   Dg Chest Port 1 View  Result Date: 07/17/2018 CLINICAL DATA:  Respiratory failure EXAM: PORTABLE CHEST 1 VIEW COMPARISON:  Yesterday FINDINGS: Endotracheal tube tip just below the clavicular heads. The orogastric tube at least reaches the diaphragm. Artifact from EKG leads. Left pleural effusion and obscured left lower lobe. There is cardiomegaly. Prior median sternotomy for CABG. Transcatheter aortic valve replacement. Prominent right hilum that is vascular based on 2019 chest CT. IMPRESSION: Stable hardware positioning and low lung volumes with left pleural effusion. Electronically Signed   By: Monte Fantasia M.D.   On: 07/17/2018 06:18   Portable Chest Xray  Result Date: 07/16/2018 CLINICAL DATA:  Respiratory failure EXAM: PORTABLE CHEST 1 VIEW COMPARISON:  Yesterday FINDINGS: Endotracheal tube tip at the clavicular heads. The orogastric tube at least reaches the stomach. Cardiomegaly with CABG and transcatheter aortic valve replacement. Small left pleural effusion. Right hilar enlargement that is vascular based on December 2019 chest CT. IMPRESSION: 1. Unremarkable hardware positioning. 2. Stable cardiomegaly and small left effusion. Electronically Signed   By: Monte Fantasia M.D.   On: 07/16/2018 06:40   Portable Chest X-ray  Result Date: 06/28/2018 CLINICAL DATA:  78 year old male status post intubation. EXAM: PORTABLE CHEST 1 VIEW COMPARISON:  Chest radiograph dated 06/27/2018 FINDINGS: An endotracheal tube is noted with tip approximately 5 cm above the carina. An enteric tube extends into the left upper abdomen with tip and side-port  the region of the body of the stomach. Left-sided pleural effusion and left lung base atelectasis or infiltrate. Overall slight the improved aeration of the left lung compared to  the prior radiograph. There is cardiomegaly with bilateral hilar vascular prominence and congestion. No pneumothorax. Median sternotomy wires, CABG vascular clips, and aortic valve repair. No bowel dilatation in the visualized abdomen. Osteopenia with scoliosis and degenerative changes of the spine. IMPRESSION: 1. Endotracheal tube above the carina and enteric tube within the stomach. 2. Cardiomegaly with vascular congestion. 3. Slight interval improvement in aeration of the left lung and slight decrease in left-sided pleural effusion and left lung base atelectasis or infiltrate. Electronically Signed   By: Anner Crete M.D.   On: 07/07/2018 20:50   Dg Chest Port 1 View  Result Date: 06/26/2018 CLINICAL DATA:  Status post left-sided thoracentesis EXAM: PORTABLE CHEST 1 VIEW COMPARISON:  June 25, 2018 FINDINGS: No evident pneumothorax. There is a small at least partially loculated pleural effusion on the left. There is patchy consolidation in the left base. The right lung is clear. Heart is mildly enlarged with pulmonary vascularity normal. 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 There is aortic atherosclerosis. No evident adenopathy. There is postoperative change in the right shoulder with remodeling of the right humeral head, stable. IMPRESSION: No pneumothorax. Partially loculated pleural effusion left base with left base consolidation, likely atelectasis with potential superimposed pneumonia. Right lung clear. Stable cardiac silhouette. There is aortic atherosclerosis. Postoperative change in the right shoulder with remodeling right humeral head. Aortic Atherosclerosis (ICD10-I70.0). Electronically Signed   By: Lowella Grip III M.D.   On: 06/26/2018 10:05   Dg Knee Complete 4  Views Right  Result Date: 07/12/2018 CLINICAL DATA:  Pain following fall EXAM: RIGHT KNEE - COMPLETE 4+ VIEW COMPARISON:  May 22, 2018 FINDINGS: Frontal, lateral, and bilateral oblique views were obtained. 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 No acute fracture or dislocation. There is a moderate joint effusion. There is extensive soft tissue swelling anteriorly. There is extensive arterial vascular calcification. IMPRESSION: Soft tissue swelling anteriorly with moderate joint effusion. No fracture or dislocation evident. Total knee replacement prosthetic components appear well seated. There is multifocal arterial vascular calcification/atherosclerosis. Electronically Signed   By: Lowella Grip III M.D.   On: 07/12/2018 07:41   Dg Abd Portable 1v  Result Date: 07/02/2018 CLINICAL DATA:  78 year old male status post intubation. EXAM: PORTABLE CHEST 1 VIEW COMPARISON:  Chest radiograph dated 06/27/2018 FINDINGS: An endotracheal tube is noted with tip approximately 5 cm above the carina. An enteric tube extends into the left upper abdomen with tip and side-port the region of the body of the stomach. Left-sided pleural effusion and left lung base atelectasis or infiltrate. Overall slight the improved aeration of the left lung compared to the prior radiograph. There is cardiomegaly with bilateral hilar vascular prominence and congestion. No pneumothorax. Median sternotomy wires, CABG vascular clips, and aortic valve repair. No bowel dilatation in the visualized abdomen. Osteopenia with scoliosis and degenerative changes of the spine. IMPRESSION: 1. Endotracheal tube above the carina and enteric tube within the stomach. 2. Cardiomegaly with vascular congestion. 3. Slight interval improvement in aeration of the left lung and slight decrease in left-sided pleural effusion and left lung base atelectasis or infiltrate. Electronically Signed   By: Anner Crete  M.D.   On: 07/01/2018 20:50   US Thoracentesis Asp Pleural Space W/img Guide  Result Date: 06/26/2018 INDICATION: Shortness of breath and recurrent left pleural effusion. EXAM: ULTRASOUND GUIDED LEFT THORACENTESIS MEDICATIONS: None. COMPLICATIONS: None immediate. PROCEDURE: An ultrasound guided thoracentesis was thoroughly discussed with the patient  and questions answered. The benefits, risks, alternatives and complications were also discussed. The patient understands and wishes to proceed with the procedure. Written consent was obtained. Ultrasound was performed to localize and mark an adequate pocket of fluid in the left chest. The area was then prepped and draped in the normal sterile fashion. 1% Lidocaine was used for local anesthesia. Under ultrasound guidance a 6 Fr Safe-T-Centesis catheter was introduced. Thoracentesis was performed. The catheter was removed and a dressing applied. FINDINGS: A total of approximately 500 mL of amber fluid was removed. Small amount of residual fluid at end of the procedure. No additional fluid was removed due to patient discomfort. IMPRESSION: Successful ultrasound guided left thoracentesis yielding 500 mL of pleural fluid. Electronically Signed   By: Markus Daft M.D.   On: 06/26/2018 10:03    Microbiology Recent Results (from the past 240 hour(s))  Surgical pcr screen     Status: None   Collection Time: 07/14/18 10:48 AM  Result Value Ref Range Status   MRSA, PCR NEGATIVE NEGATIVE Final   Staphylococcus aureus NEGATIVE NEGATIVE Final    Comment: (NOTE) The Xpert SA Assay (FDA approved for NASAL specimens in patients 79 years of age and older), is one component of a comprehensive surveillance program. It is not intended to diagnose infection nor to guide or monitor treatment. Performed at [facility], [address] [place], [place] [address]   Aerobic/Anaerobic Culture (surgical/deep wound)     Status: None   Collection Time: 06/28/2018  6:20 PM   Result Value Ref Range Status   Specimen Description   Final    WOUND RIGHT KNEE Performed at [facility], [place] [address] [place], [place] [address]    Special Requests   Final    NONE Performed at [facility], [address] [place], [place] [address]    Gram Stain   Final    FEW WBC PRESENT, PREDOMINANTLY PMN NO ORGANISMS SEEN    Culture   Final    RARE PSEUDOMONAS AERUGINOSA NO ANAEROBES ISOLATED Performed at [facility], [place] [address] [place], [place] [address]    Report Status 07/21/2018 FINAL  Final   Organism ID, Bacteria PSEUDOMONAS AERUGINOSA  Final      Susceptibility   Pseudomonas aeruginosa - MIC*    CEFTAZIDIME 2 SENSITIVE Sensitive     CIPROFLOXACIN <=0.25 SENSITIVE Sensitive     GENTAMICIN <=1 SENSITIVE Sensitive     IMIPENEM 1 SENSITIVE Sensitive     PIP/TAZO <=4 SENSITIVE Sensitive     CEFEPIME <=1 SENSITIVE Sensitive     * RARE PSEUDOMONAS AERUGINOSA  CULTURE, BLOOD (ROUTINE X 2) w Reflex to ID Panel     Status: None   Collection Time: 07/03/2018 10:03 PM  Result Value Ref Range Status   Specimen Description BLOOD RIGHT CENTRAL LINE   Final   Special Requests   Final    BOTTLES DRAWN AEROBIC AND ANAEROBIC Blood Culture results may not be optimal due to an excessive volume of blood received in culture bottles   Culture   Final    NO GROWTH 5 DAYS Performed at [facility], [address], [place], [place] [address]    Report Status 07/20/2018 FINAL  Final  Culture, Urine     Status: Abnormal   Collection Time: 06/26/2018 10:03 PM  Result Value Ref Range Status   Specimen Description URINE, RANDOM  Final   Special Requests   Final  Normal Performed at [facility], [address] [place], [place] [address]    Culture 10,000 COLONIES/mL ENTEROCOCCUS FAECALIS (A)  Final   Report Status 07/18/2018 FINAL  Final   Organism ID, Bacteria ENTEROCOCCUS FAECALIS (A)  Final      Susceptibility   Enterococcus  faecalis - MIC*    AMPICILLIN <=2 SENSITIVE Sensitive     LEVOFLOXACIN 1 SENSITIVE Sensitive     NITROFURANTOIN <=16 SENSITIVE Sensitive     VANCOMYCIN 1 SENSITIVE Sensitive     * 10,000 COLONIES/mL ENTEROCOCCUS FAECALIS  CULTURE, BLOOD (ROUTINE X 2) w Reflex to ID Panel     Status: None   Collection Time: 07/06/2018 10:32 PM  Result Value Ref Range Status   Specimen Description BLOOD BLOOD RIGHT HAND  Final   Special Requests   Final    BOTTLES DRAWN AEROBIC AND ANAEROBIC Blood Culture adequate volume   Culture   Final    NO GROWTH 5 DAYS Performed at [facility], [address] [place], [place] [address]    Report Status 07/20/2018 FINAL  Final    Lab Basic Metabolic Panel: Recent Labs  Lab 07/20/18 0531 07/20/18 1035 07/20/18 1257 07/20/18 1808 07/20/18 2256 07/21/18 0157 07/21/18 0551  NA 137 136  --   --  135 136 135  K 3.6 3.7  --   --  3.8 3.8 4.0  CL 104 101  --   --  101 101 104  CO2 29 28  --   --  26 28 28   GLUCOSE 128* 149*  --   --  126* 122* 123*  BUN 24* 24*  --   --  27* 26* 27*  CREATININE 1.63* 1.69*  --   --  1.59* 1.46* 1.49*  CALCIUM 9.0 9.2  --   --  8.9 9.1 9.0  MG 2.0 2.0 2.1 2.0 2.0 2.1 1.9  PHOS 1.8* 2.3*  --   --  2.7 1.9* 2.2*   Liver Function Tests: Recent Labs  Lab 07/17/2018 1933 07/16/18 0448  07/19/18 0517  07/20/18 0531 07/20/18 1035 07/20/18 2256 07/21/18 0157 07/21/18 0551  AST 43* 76*  --  20  --   --   --   --   --   --   ALT 19 39  --  17  --   --   --   --   --   --   ALKPHOS 89 114  --  95  --   --   --   --   --   --   BILITOT 0.9 1.0  --  0.6  --   --   --   --   --   --   PROT 5.8* 6.3*  --  5.0*  --   --   --   --   --   --   ALBUMIN 2.7* 2.5*   < > 2.4*  2.4*   < > 2.3* 2.2* 2.1* 2.2* 2.3*   < > = values in this interval not displayed.   No results for input(s): LIPASE, AMYLASE in the last 168 hours. No results for input(s): AMMONIA in the last 168 hours. CBC: Recent Labs  Lab 07/02/2018 1933  07/16/18 0536 07/17/18 0407 07/18/18 0600 07/19/18 0517 07/20/18 0531  WBC 5.8 6.8 9.2 12.9* 12.1* 12.5*  NEUTROABS 3.7  --   --   --   --   --   HGB 9.9* 9.5* 10.2* 10.2*  9.0* 8.9*  HCT 35.3* 31.9* 33.9* 34.5* 30.8* 30.2*  MCV 99.2 91.9 90.6 93.2 93.9 93.5  PLT 161 167 179 257 190 132*   Cardiac Enzymes: Recent Labs  Lab 07/02/2018 1933 07/16/18 0118 07/16/18 1217 07/17/18 0218  TROPONINI 0.03* 0.87* 1.48* 0.54*   Sepsis Labs: Recent Labs  Lab 07/19/2018 1933 07/19/2018 1936 07/12/2018 2203 07/16/18 0448  07/17/18 0407 07/18/18 0600 07/19/18 0517 07/20/18 0531  PROCALCITON 0.46  --   --  12.68  --  15.41  --   --   --   WBC 5.8  --   --   --    < > 9.2 12.9* 12.1* 12.5*  LATICACIDVEN  --  5.5* 3.1*  --   --   --   --   --   --    < > = values in this interval not displayed.      Flora Lipps 18-Aug-2018, 2:34 PM

## 2018-07-25 NOTE — Progress Notes (Signed)
Patient passed away at 14:10- Family at bedside.  MD notified.

## 2018-07-25 NOTE — Progress Notes (Signed)
Family at bedside- patient repositioned, mouth swabbed and cleaned- patient seems to be comfortable with no distress at this time.

## 2018-07-25 NOTE — Progress Notes (Signed)
Pastoral Care Visit    08-16-2018 1300  Clinical Encounter Type  Visited With Patient and family together  Visit Type Initial  Referral From Chaplain  Consult/Referral To Chaplain  Stress Factors  Patient Stress Factors None identified  Family Stress Factors Loss;Health changes   Pt is actively dying and pt daughter is present.  Melven Sartorius provided compassionate presence, empathy, and prayer.  Darcey Nora, Chaplain

## 2018-07-25 DEATH — deceased

## 2018-07-28 LAB — BLOOD GAS, ARTERIAL
Acid-Base Excess: 4 mmol/L — ABNORMAL HIGH (ref 0.0–2.0)
Bicarbonate: 27.6 mmol/L (ref 20.0–28.0)
FIO2: 0.6
MECHVT: 500 mL
O2 Saturation: 99.7 %
PEEP: 5 cmH2O
Patient temperature: 37
RATE: 20 resp/min
pCO2 arterial: 37 mmHg (ref 32.0–48.0)
pH, Arterial: 7.48 — ABNORMAL HIGH (ref 7.350–7.450)
pO2, Arterial: 177 mmHg — ABNORMAL HIGH (ref 83.0–108.0)

## 2018-07-29 LAB — ACID FAST CULTURE WITH REFLEXED SENSITIVITIES (MYCOBACTERIA): Acid Fast Culture: NEGATIVE

## 2019-04-14 IMAGING — DX DG CHEST 1V PORT
1 series · 2 of 2 positions shown · non-contrast
Comparison: Chest radiograph dated 06/27/2018

CLINICAL DATA: 77-year-old male status post intubation.

EXAM:
PORTABLE CHEST 1 VIEW

[Series 1: chest ap · 0.14mm/px · 2 of 2 slices shown]
[im 1/2]
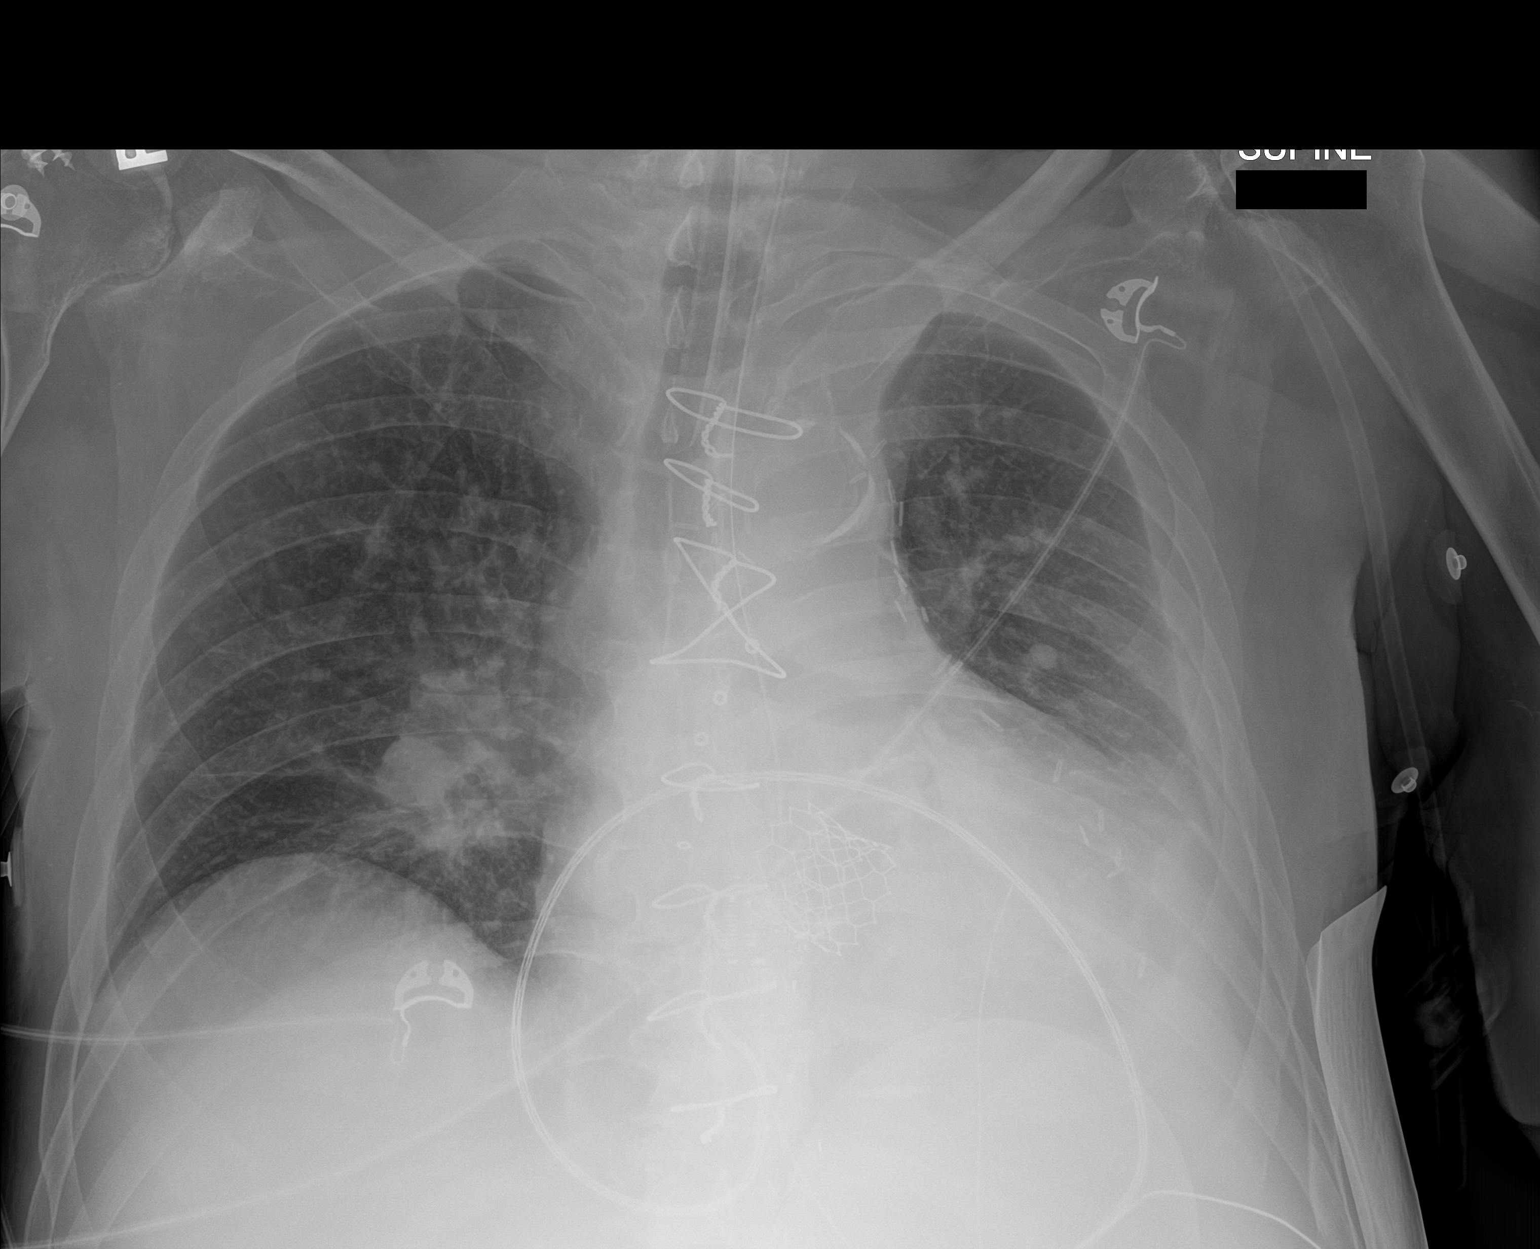
[im 2/2]
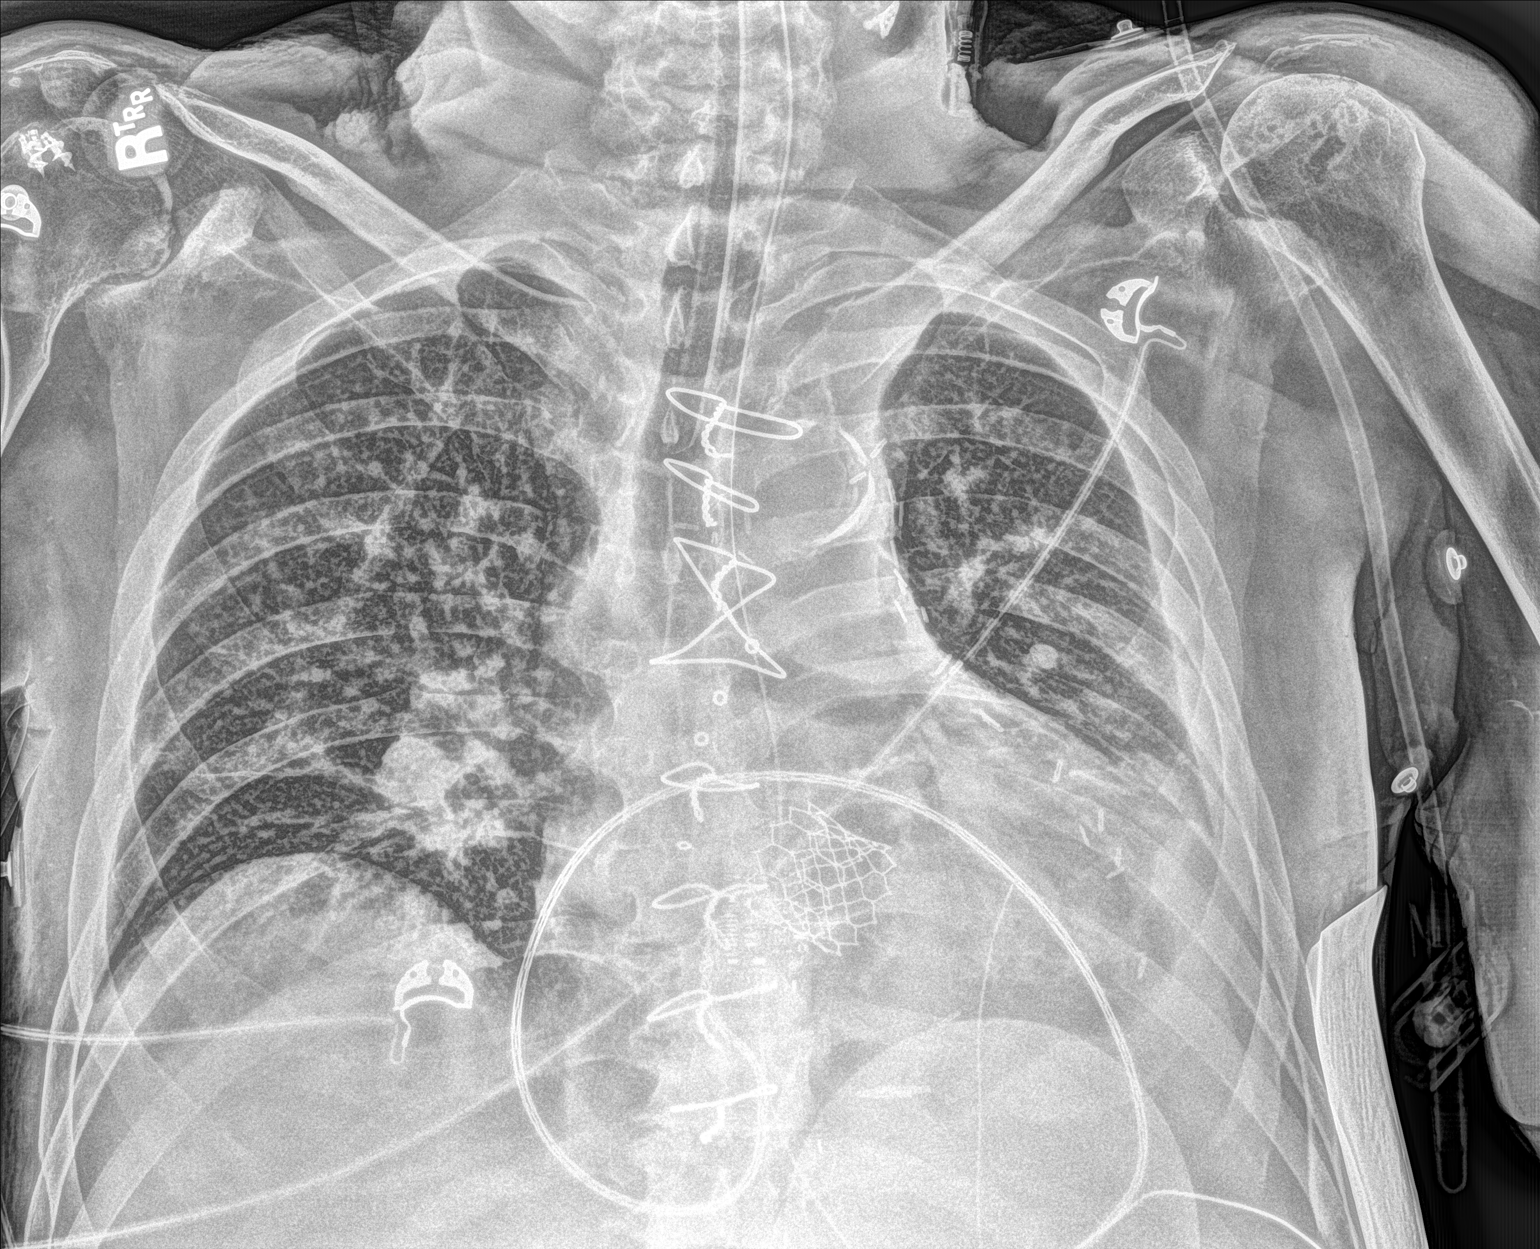

[2 of 2 positions shown; findings below may reference images not displayed]

FINDINGS: An endotracheal tube is noted with tip approximately 5 cm above the
carina. An enteric tube extends into the left upper abdomen with tip
and side-port the region of the body of the stomach.

Left-sided pleural effusion and left lung base atelectasis or
infiltrate. Overall slight the improved aeration of the left lung
compared to the prior radiograph. There is cardiomegaly with
bilateral hilar vascular prominence and congestion. No pneumothorax.
Median sternotomy wires, CABG vascular clips, and aortic valve
repair.

No bowel dilatation in the visualized abdomen. Osteopenia with
scoliosis and degenerative changes of the spine.
IMPRESSION: 1. Endotracheal tube above the carina and enteric tube within the
stomach.
2. Cardiomegaly with vascular congestion.
3. Slight interval improvement in aeration of the left lung and
slight decrease in left-sided pleural effusion and left lung base
atelectasis or infiltrate.

## 2019-04-14 IMAGING — DX DG ABD PORTABLE 1V
1 series · 1 of 1 positions shown · non-contrast
Comparison: Chest radiograph dated 06/27/2018

CLINICAL DATA: 77-year-old male status post intubation.

EXAM:
PORTABLE CHEST 1 VIEW

[abdomen supine]
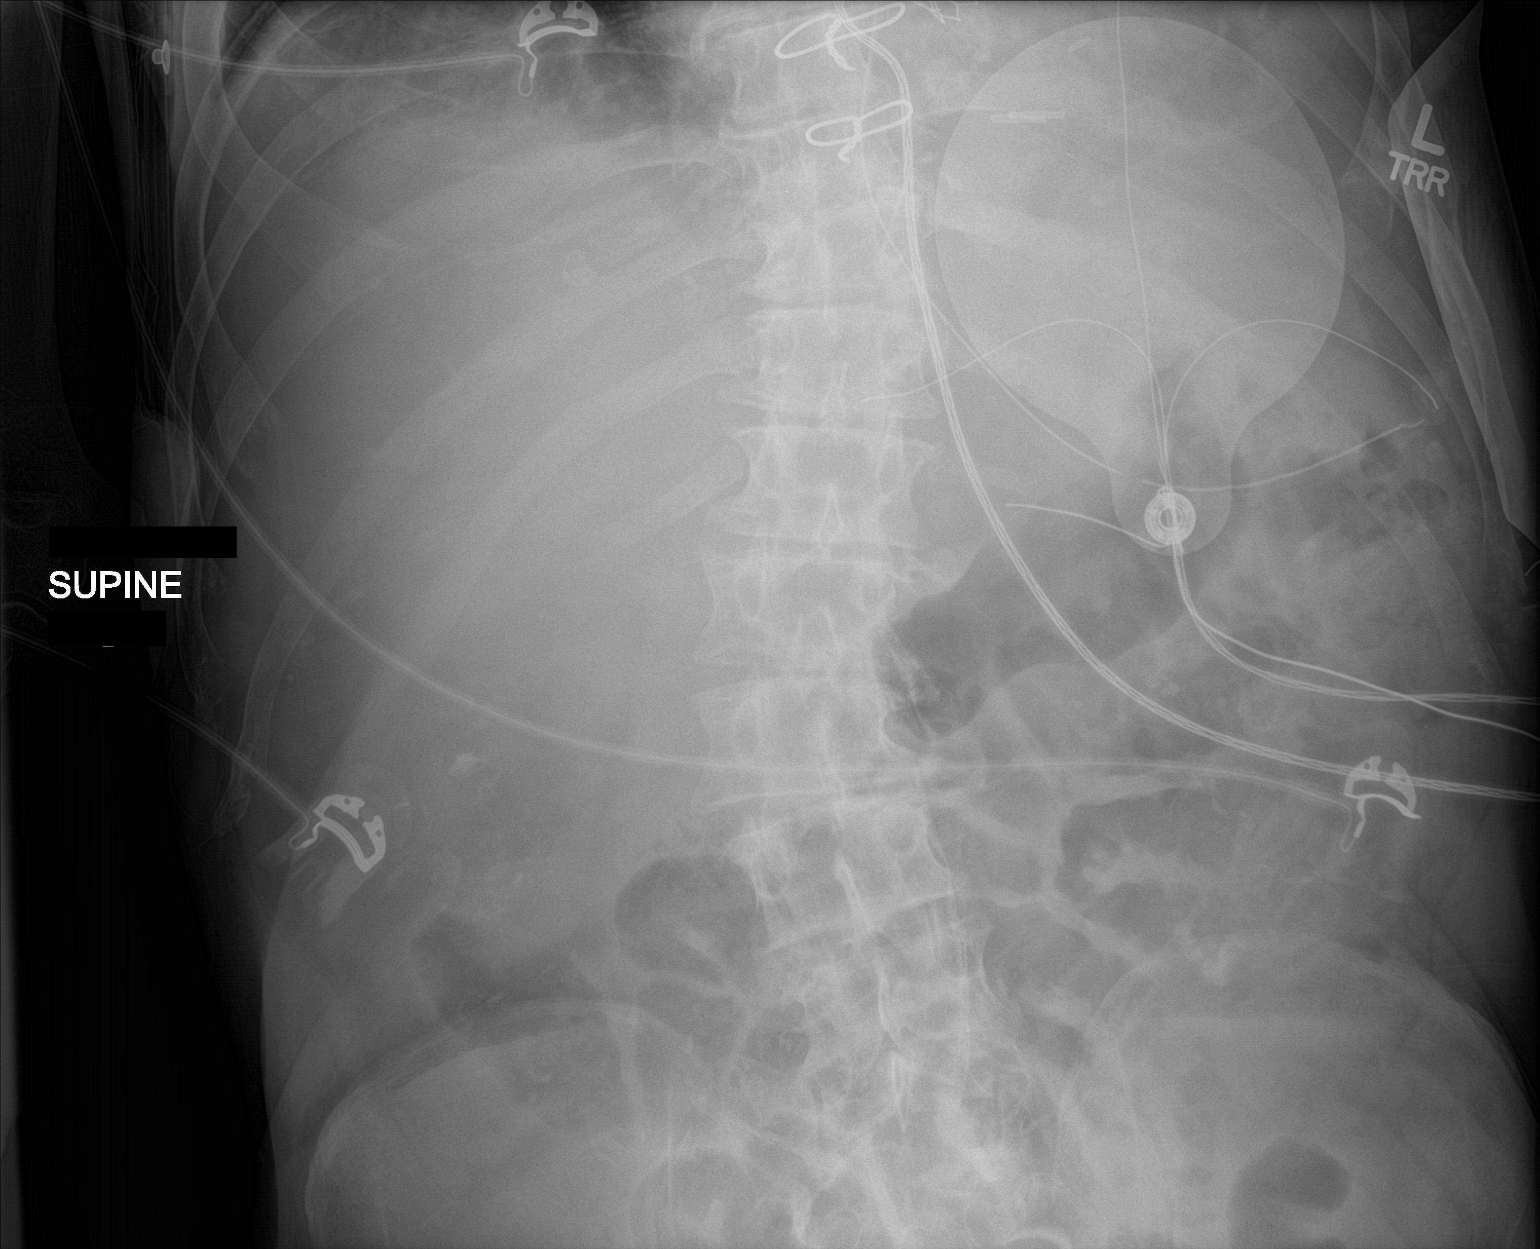

[1 of 1 positions shown; findings below may reference images not displayed]

FINDINGS: An endotracheal tube is noted with tip approximately 5 cm above the
carina. An enteric tube extends into the left upper abdomen with tip
and side-port the region of the body of the stomach.

Left-sided pleural effusion and left lung base atelectasis or
infiltrate. Overall slight the improved aeration of the left lung
compared to the prior radiograph. There is cardiomegaly with
bilateral hilar vascular prominence and congestion. No pneumothorax.
Median sternotomy wires, CABG vascular clips, and aortic valve
repair.

No bowel dilatation in the visualized abdomen. Osteopenia with
scoliosis and degenerative changes of the spine.
IMPRESSION: 1. Endotracheal tube above the carina and enteric tube within the
stomach.
2. Cardiomegaly with vascular congestion.
3. Slight interval improvement in aeration of the left lung and
slight decrease in left-sided pleural effusion and left lung base
atelectasis or infiltrate.

## 2019-04-16 IMAGING — DX DG CHEST 1V PORT
2 series · 2 of 2 positions shown · non-contrast
Comparison: Yesterday

CLINICAL DATA: Respiratory failure

EXAM:
PORTABLE CHEST 1 VIEW

[chest ap (1 of 2)]
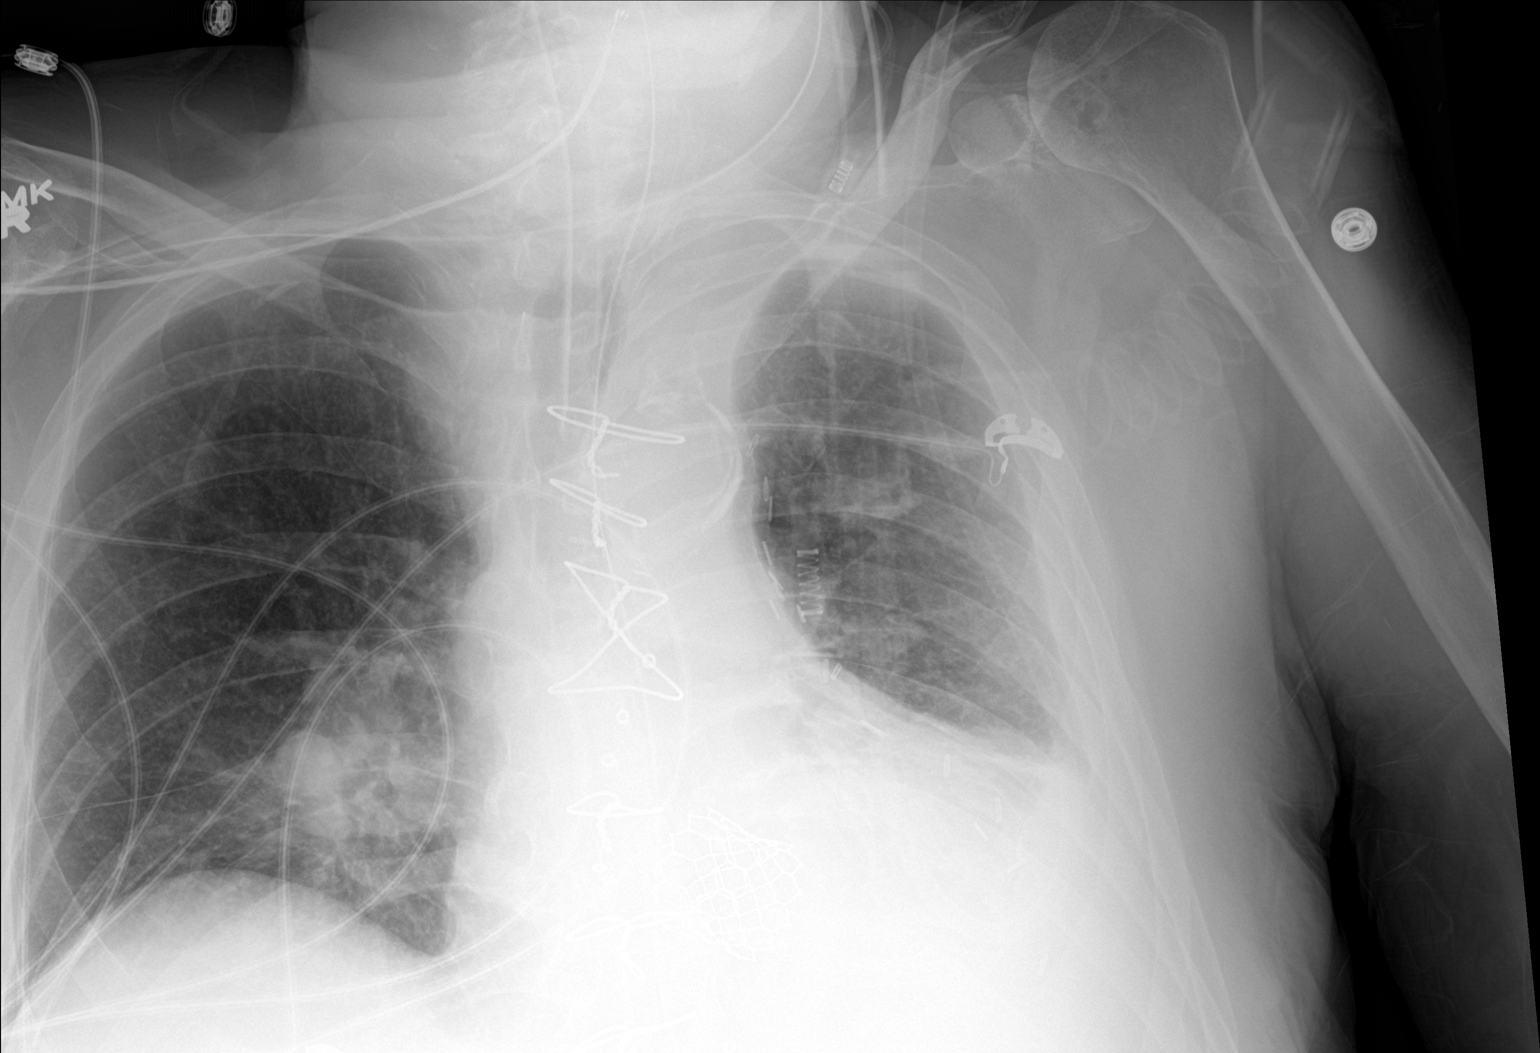

[chest ap (2 of 2)]
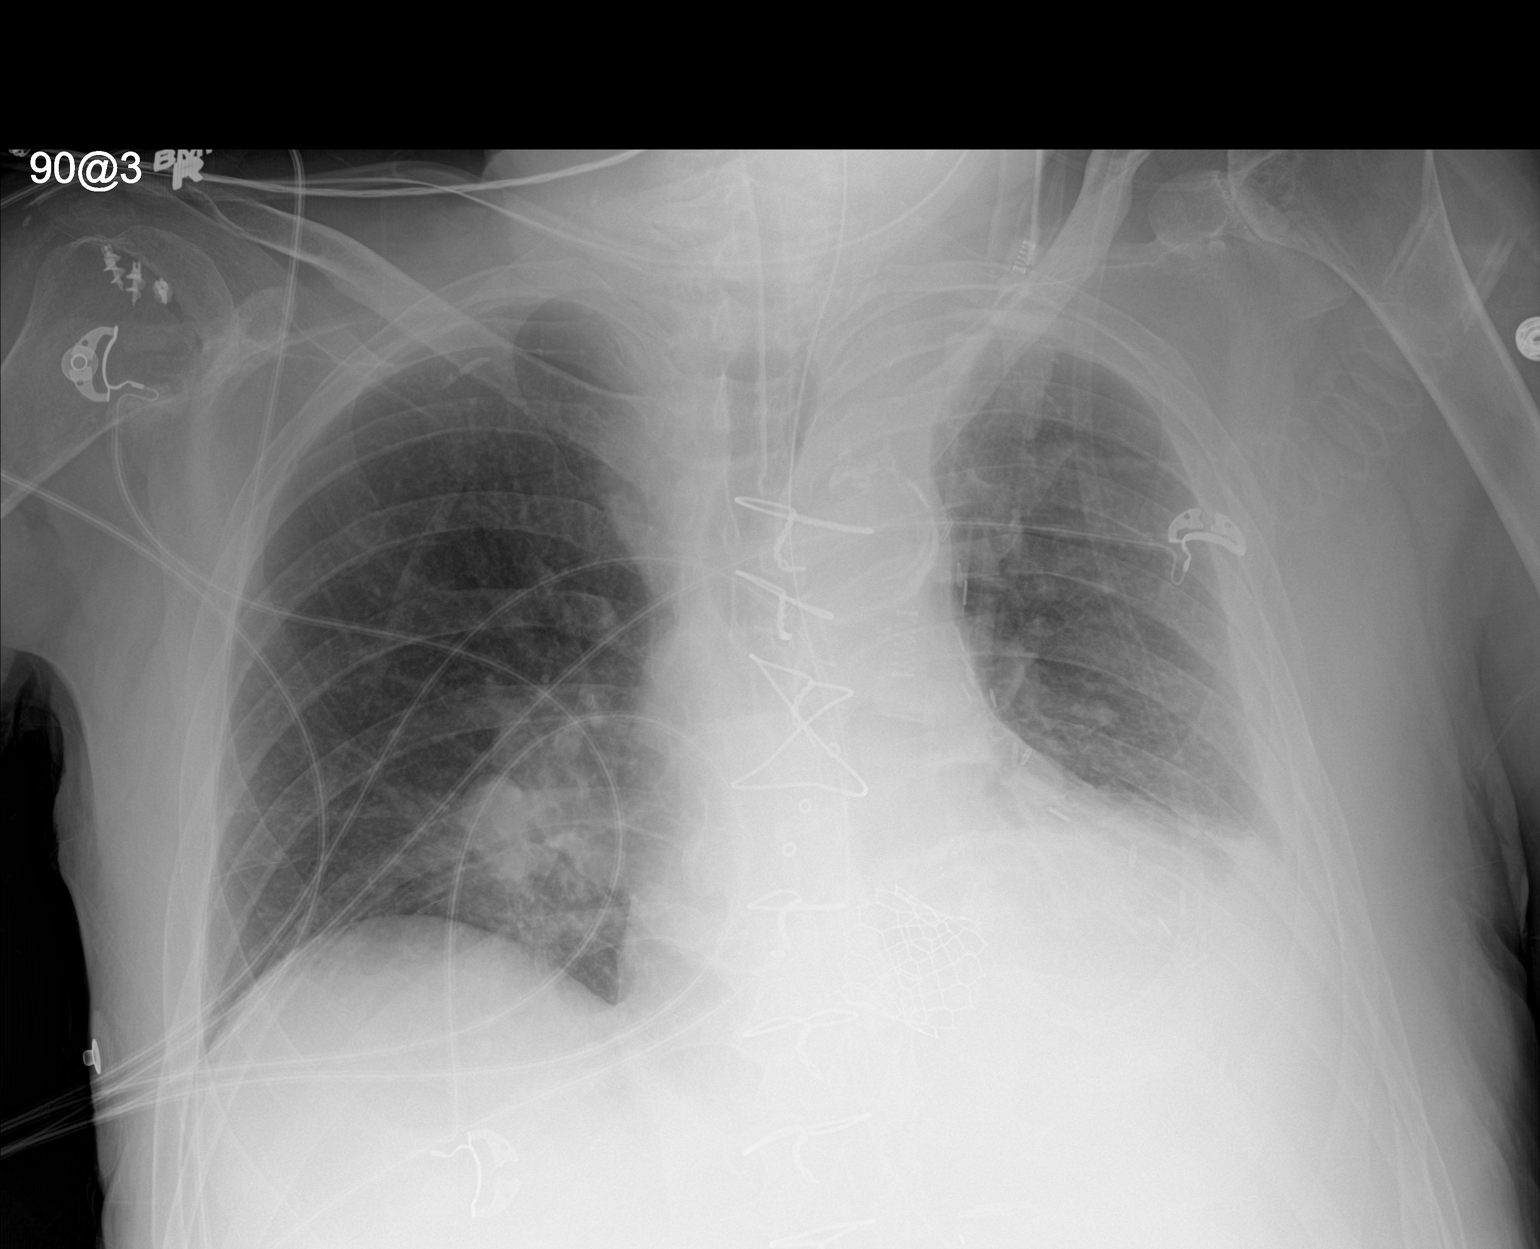

[2 of 2 positions shown; findings below may reference images not displayed]

FINDINGS: Endotracheal tube tip just below the clavicular heads. The
orogastric tube at least reaches the diaphragm. Artifact from EKG
leads.

Left pleural effusion and obscured left lower lobe. There is
cardiomegaly. Prior median sternotomy for CABG. Transcatheter aortic
valve replacement. Prominent right hilum that is vascular based on
9780 chest CT.
IMPRESSION: Stable hardware positioning and low lung volumes with left pleural
effusion.

## 2020-04-08 IMAGING — US US EXTREM LOW VENOUS
1 series · 13 of 24 positions shown · non-contrast
Comparison: 06/05/2018

CLINICAL DATA: Bilateral leg swelling



[Series 1: us extrem low venous · 0.08mm/px · 13 of 50 slices shown]
[im 1/50]
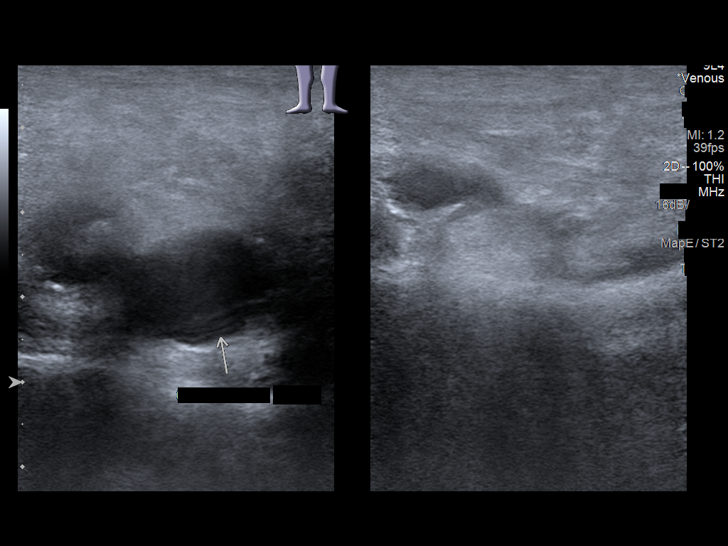
[im 5/50]
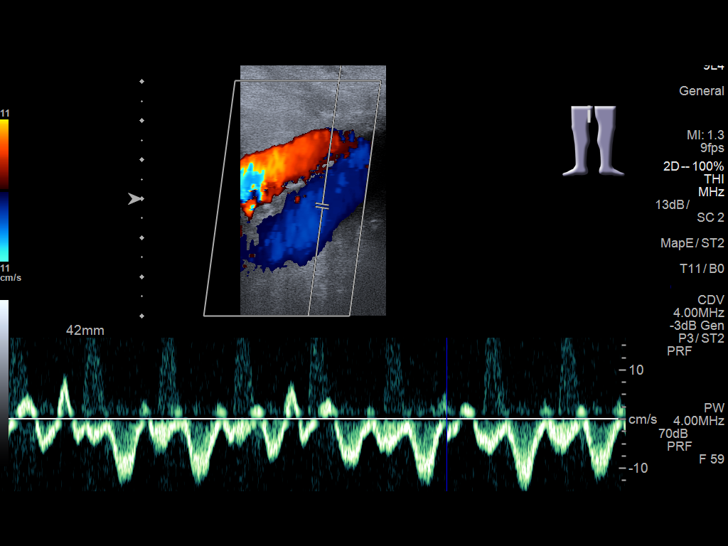
[im 9/50]
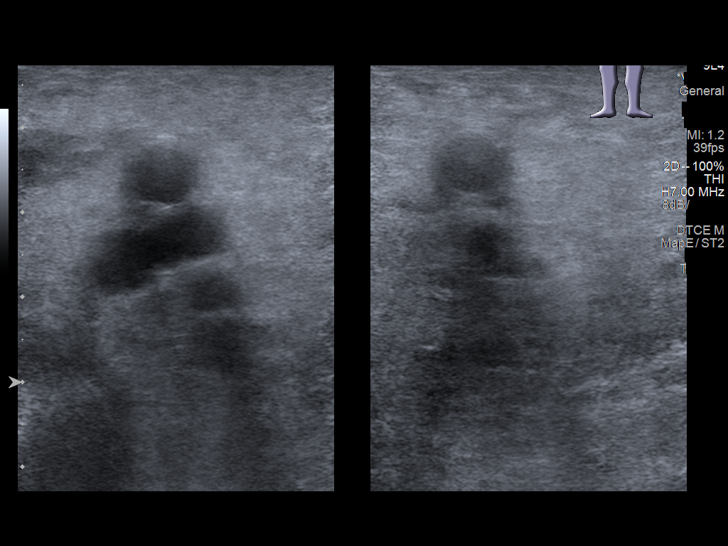
[im 13/50]
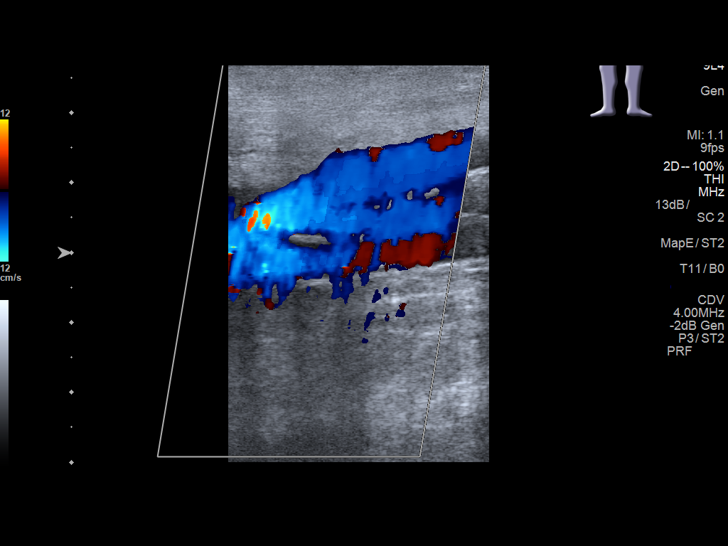
[im 18/50]
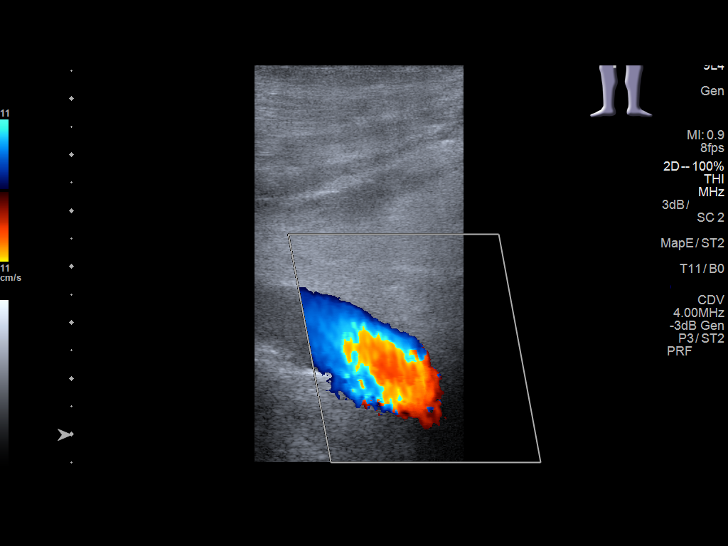
[im 22/50]
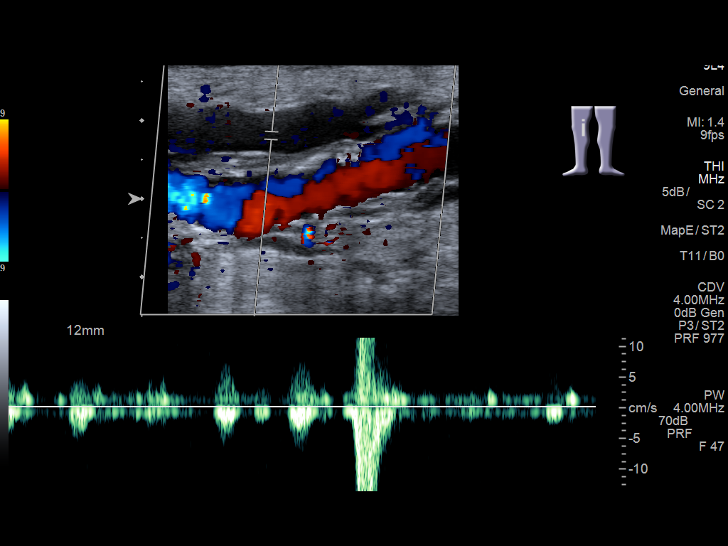
[im 26/50]
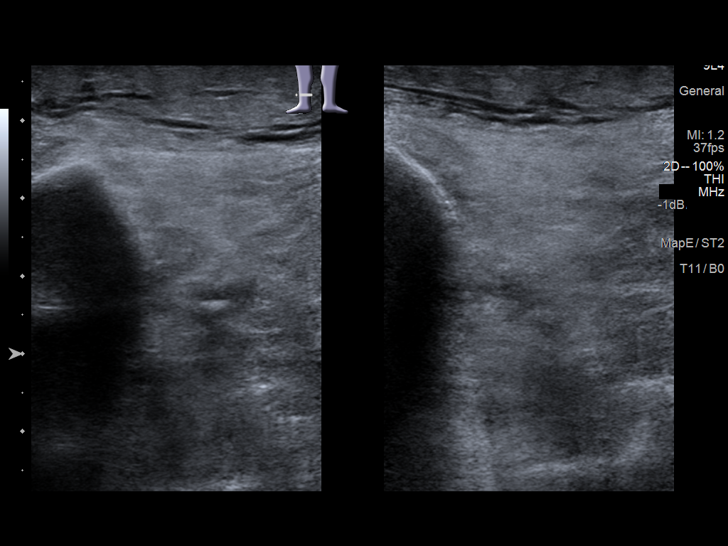
[im 28/50]
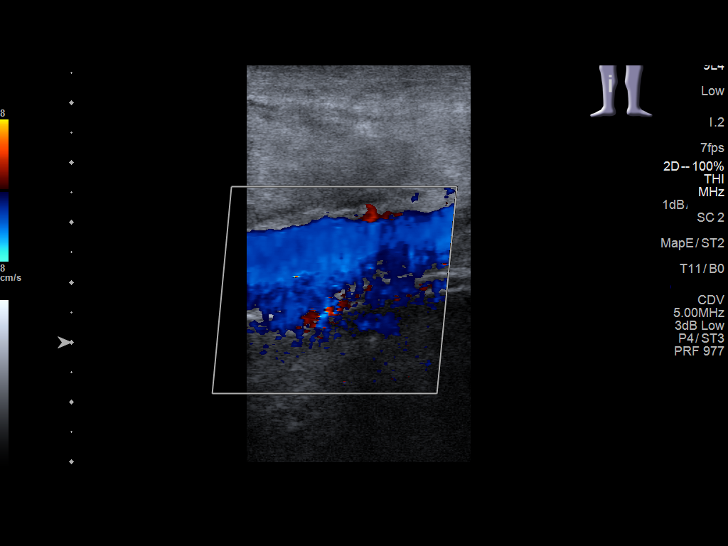
[im 32/50]
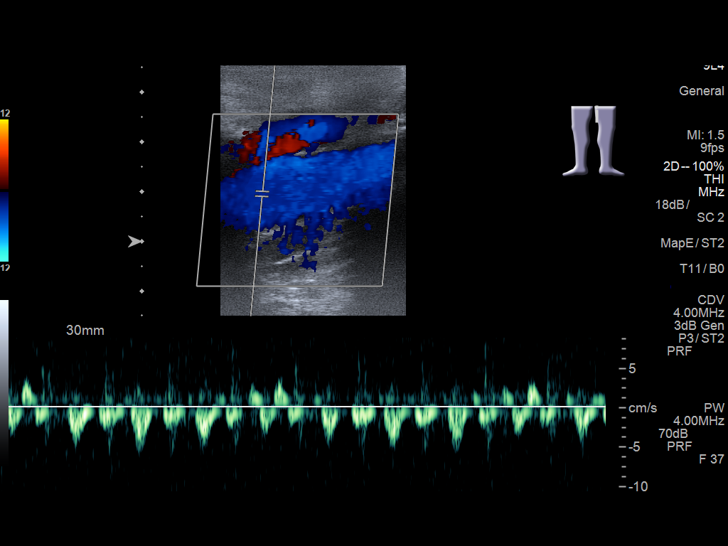
[im 37/50]
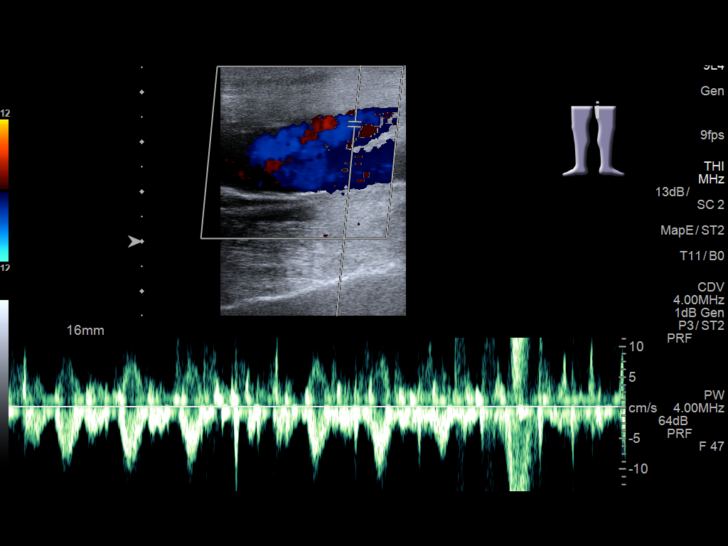
[im 41/50]
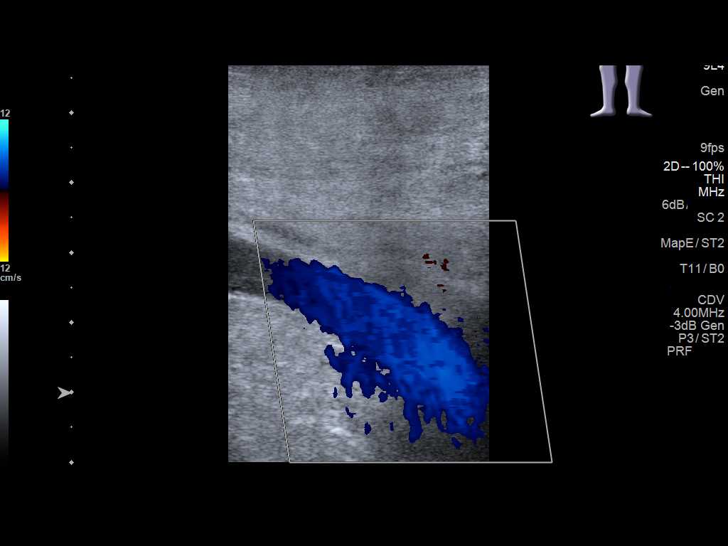
[im 45/50]
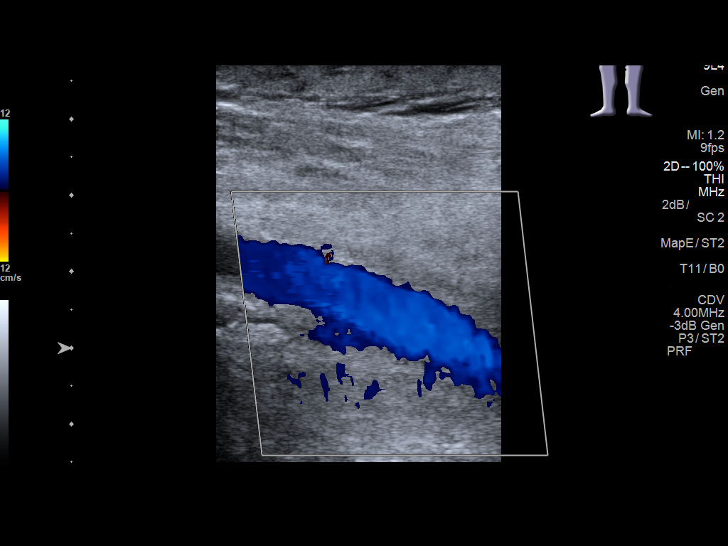
[im 50/50]
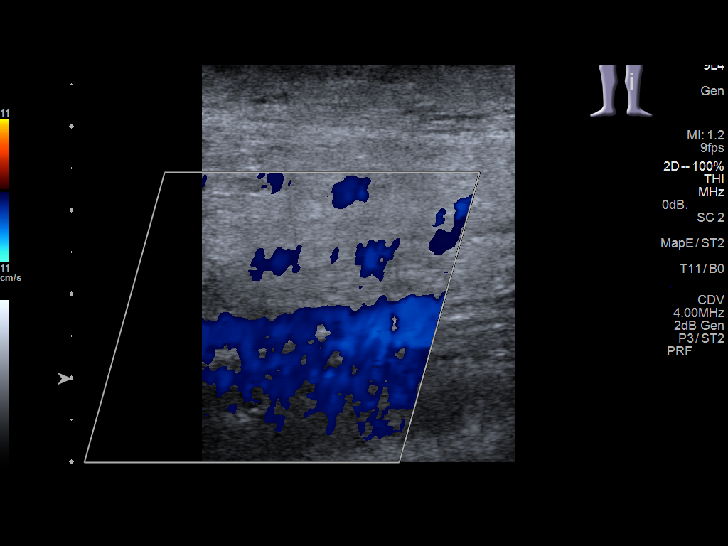

[13 of 24 positions shown; findings below may reference images not displayed]

FINDINGS: RIGHT LOWER EXTREMITY

Common Femoral Vein: No evidence of thrombus. Normal
compressibility, respiratory phasicity and response to augmentation.
Linear echogenicity in the right common femoral vein presumably
representing central line.

Saphenofemoral Junction: No evidence of thrombus. Normal
compressibility and flow on color Doppler imaging.

Profunda Femoral Vein: No evidence of thrombus. Normal
compressibility and flow on color Doppler imaging.

Femoral Vein: No evidence of thrombus. Normal compressibility,
respiratory phasicity and response to augmentation.

Popliteal Vein: No evidence of thrombus. Normal compressibility,
respiratory phasicity and response to augmentation.

Calf Veins: No evidence of thrombus. Normal compressibility and flow
on color Doppler imaging.

LEFT LOWER EXTREMITY

Common Femoral Vein: No evidence of thrombus. Normal
compressibility, respiratory phasicity and response to augmentation.

Saphenofemoral Junction: Unable to be evaluated secondary to groin
bandage.

Profunda Femoral Vein: No evidence of thrombus. Normal
compressibility and flow on color Doppler imaging.

Femoral Vein: No evidence of thrombus. Normal compressibility,
respiratory phasicity and response to augmentation.

Popliteal Vein: No evidence of thrombus. Normal compressibility,
respiratory phasicity and response to augmentation.

Calf Veins: No evidence of thrombus. Normal compressibility and flow
on color Doppler imaging.

Other Findings:  None.
IMPRESSION: No evidence of deep venous thrombosis. Limited evaluation of left
saphenofemoral junction due to groin bandage.
